# Patient Record
Sex: Female | Born: 1960
Health system: Southern US, Community
[De-identification: ages and names within clinical notes are randomized; demographics above are authoritative.]

## PROBLEM LIST (undated history)

## (undated) DIAGNOSIS — F329 Major depressive disorder, single episode, unspecified: Secondary | ICD-10-CM

## (undated) DIAGNOSIS — F32A Depression, unspecified: Secondary | ICD-10-CM

## (undated) DIAGNOSIS — E119 Type 2 diabetes mellitus without complications: Secondary | ICD-10-CM

## (undated) DIAGNOSIS — I10 Essential (primary) hypertension: Secondary | ICD-10-CM

## (undated) HISTORY — PX: CHOLECYSTECTOMY: SHX55

---

## 1998-10-20 ENCOUNTER — Inpatient Hospital Stay (HOSPITAL_COMMUNITY): Admission: RE | Admit: 1998-10-20 | Discharge: 1998-10-20 | Payer: Self-pay | Admitting: Obstetrics & Gynecology

## 1998-10-21 ENCOUNTER — Encounter: Admission: RE | Admit: 1998-10-21 | Discharge: 1998-10-21 | Payer: Self-pay | Admitting: Obstetrics & Gynecology

## 1998-10-29 ENCOUNTER — Encounter: Admission: RE | Admit: 1998-10-29 | Discharge: 1998-10-29 | Payer: Self-pay | Admitting: Obstetrics

## 1998-10-31 ENCOUNTER — Encounter: Payer: Self-pay | Admitting: Emergency Medicine

## 1998-10-31 ENCOUNTER — Emergency Department (HOSPITAL_COMMUNITY): Admission: EM | Admit: 1998-10-31 | Discharge: 1998-10-31 | Payer: Self-pay | Admitting: Emergency Medicine

## 1998-11-04 ENCOUNTER — Encounter: Admission: RE | Admit: 1998-11-04 | Discharge: 1998-11-04 | Payer: Self-pay | Admitting: Obstetrics & Gynecology

## 1998-11-11 ENCOUNTER — Encounter: Admission: RE | Admit: 1998-11-11 | Discharge: 1998-11-11 | Payer: Self-pay | Admitting: Obstetrics & Gynecology

## 1998-11-25 ENCOUNTER — Encounter: Admission: RE | Admit: 1998-11-25 | Discharge: 1998-11-25 | Payer: Self-pay | Admitting: Obstetrics & Gynecology

## 1998-11-30 ENCOUNTER — Ambulatory Visit (HOSPITAL_COMMUNITY): Admission: RE | Admit: 1998-11-30 | Discharge: 1998-11-30 | Payer: Self-pay | Admitting: Obstetrics

## 1998-12-02 ENCOUNTER — Encounter: Admission: RE | Admit: 1998-12-02 | Discharge: 1998-12-02 | Payer: Self-pay | Admitting: Obstetrics & Gynecology

## 1998-12-09 ENCOUNTER — Encounter: Admission: RE | Admit: 1998-12-09 | Discharge: 1998-12-09 | Payer: Self-pay | Admitting: Obstetrics & Gynecology

## 1998-12-16 ENCOUNTER — Encounter: Admission: RE | Admit: 1998-12-16 | Discharge: 1998-12-16 | Payer: Self-pay | Admitting: Obstetrics & Gynecology

## 1998-12-23 ENCOUNTER — Encounter: Admission: RE | Admit: 1998-12-23 | Discharge: 1998-12-23 | Payer: Self-pay | Admitting: Obstetrics & Gynecology

## 1998-12-23 ENCOUNTER — Encounter (HOSPITAL_COMMUNITY): Admission: RE | Admit: 1998-12-23 | Discharge: 1999-01-25 | Payer: Self-pay | Admitting: Obstetrics & Gynecology

## 1998-12-23 ENCOUNTER — Encounter: Payer: Self-pay | Admitting: Obstetrics

## 1998-12-30 ENCOUNTER — Encounter: Admission: RE | Admit: 1998-12-30 | Discharge: 1998-12-30 | Payer: Self-pay | Admitting: Obstetrics & Gynecology

## 1999-01-06 ENCOUNTER — Encounter: Admission: RE | Admit: 1999-01-06 | Discharge: 1999-01-06 | Payer: Self-pay | Admitting: Obstetrics & Gynecology

## 1999-01-13 ENCOUNTER — Encounter: Admission: RE | Admit: 1999-01-13 | Discharge: 1999-01-13 | Payer: Self-pay | Admitting: Obstetrics & Gynecology

## 1999-01-13 ENCOUNTER — Observation Stay (HOSPITAL_COMMUNITY): Admission: AD | Admit: 1999-01-13 | Discharge: 1999-01-14 | Payer: Self-pay | Admitting: *Deleted

## 1999-01-20 ENCOUNTER — Encounter: Admission: RE | Admit: 1999-01-20 | Discharge: 1999-01-20 | Payer: Self-pay | Admitting: Obstetrics & Gynecology

## 1999-01-21 ENCOUNTER — Inpatient Hospital Stay (HOSPITAL_COMMUNITY): Admission: AD | Admit: 1999-01-21 | Discharge: 1999-01-25 | Payer: Self-pay | Admitting: *Deleted

## 1999-01-21 ENCOUNTER — Encounter (INDEPENDENT_AMBULATORY_CARE_PROVIDER_SITE_OTHER): Payer: Self-pay | Admitting: Specialist

## 1999-01-29 ENCOUNTER — Inpatient Hospital Stay (HOSPITAL_COMMUNITY): Admission: AD | Admit: 1999-01-29 | Discharge: 1999-01-29 | Payer: Self-pay | Admitting: *Deleted

## 1999-01-30 ENCOUNTER — Inpatient Hospital Stay (HOSPITAL_COMMUNITY): Admission: AD | Admit: 1999-01-30 | Discharge: 1999-01-30 | Payer: Self-pay | Admitting: *Deleted

## 1999-02-01 ENCOUNTER — Inpatient Hospital Stay (HOSPITAL_COMMUNITY): Admission: AD | Admit: 1999-02-01 | Discharge: 1999-02-01 | Payer: Self-pay | Admitting: Obstetrics

## 1999-02-23 ENCOUNTER — Emergency Department (HOSPITAL_COMMUNITY): Admission: EM | Admit: 1999-02-23 | Discharge: 1999-02-23 | Payer: Self-pay | Admitting: Emergency Medicine

## 1999-11-30 ENCOUNTER — Emergency Department (HOSPITAL_COMMUNITY): Admission: EM | Admit: 1999-11-30 | Discharge: 1999-11-30 | Payer: Self-pay | Admitting: Emergency Medicine

## 2002-04-30 ENCOUNTER — Encounter: Admission: RE | Admit: 2002-04-30 | Discharge: 2002-04-30 | Payer: Self-pay | Admitting: *Deleted

## 2003-01-13 ENCOUNTER — Emergency Department (HOSPITAL_COMMUNITY): Admission: EM | Admit: 2003-01-13 | Discharge: 2003-01-13 | Payer: Self-pay | Admitting: Emergency Medicine

## 2004-12-12 ENCOUNTER — Emergency Department (HOSPITAL_COMMUNITY): Admission: EM | Admit: 2004-12-12 | Discharge: 2004-12-12 | Payer: Self-pay | Admitting: Emergency Medicine

## 2005-04-13 ENCOUNTER — Emergency Department (HOSPITAL_COMMUNITY): Admission: EM | Admit: 2005-04-13 | Discharge: 2005-04-13 | Payer: Self-pay | Admitting: Emergency Medicine

## 2005-09-19 ENCOUNTER — Emergency Department (HOSPITAL_COMMUNITY): Admission: EM | Admit: 2005-09-19 | Discharge: 2005-09-19 | Payer: Self-pay | Admitting: Emergency Medicine

## 2007-07-23 ENCOUNTER — Encounter (INDEPENDENT_AMBULATORY_CARE_PROVIDER_SITE_OTHER): Payer: Self-pay | Admitting: *Deleted

## 2007-07-23 ENCOUNTER — Observation Stay (HOSPITAL_COMMUNITY): Admission: EM | Admit: 2007-07-23 | Discharge: 2007-07-24 | Payer: Self-pay | Admitting: Emergency Medicine

## 2008-06-01 ENCOUNTER — Emergency Department (HOSPITAL_COMMUNITY): Admission: EM | Admit: 2008-06-01 | Discharge: 2008-06-01 | Payer: Self-pay | Admitting: Emergency Medicine

## 2009-07-15 ENCOUNTER — Emergency Department (HOSPITAL_COMMUNITY): Admission: EM | Admit: 2009-07-15 | Discharge: 2009-07-16 | Payer: Self-pay | Admitting: Emergency Medicine

## 2009-07-16 ENCOUNTER — Ambulatory Visit: Payer: Self-pay | Admitting: Psychiatry

## 2009-07-16 ENCOUNTER — Inpatient Hospital Stay (HOSPITAL_COMMUNITY): Admission: AD | Admit: 2009-07-16 | Discharge: 2009-07-20 | Payer: Self-pay | Admitting: Psychiatry

## 2009-08-17 ENCOUNTER — Emergency Department (HOSPITAL_COMMUNITY): Admission: EM | Admit: 2009-08-17 | Discharge: 2009-08-17 | Payer: Self-pay | Admitting: Emergency Medicine

## 2009-12-14 ENCOUNTER — Emergency Department (HOSPITAL_COMMUNITY): Admission: EM | Admit: 2009-12-14 | Discharge: 2009-12-14 | Payer: Self-pay | Admitting: Emergency Medicine

## 2010-01-04 ENCOUNTER — Emergency Department (HOSPITAL_COMMUNITY): Admission: EM | Admit: 2010-01-04 | Discharge: 2010-01-04 | Payer: Self-pay | Admitting: Emergency Medicine

## 2010-01-04 ENCOUNTER — Ambulatory Visit: Payer: Self-pay | Admitting: Psychiatry

## 2010-01-04 ENCOUNTER — Inpatient Hospital Stay (HOSPITAL_COMMUNITY): Admission: AD | Admit: 2010-01-04 | Discharge: 2010-01-11 | Payer: Self-pay | Admitting: Psychiatry

## 2010-01-15 ENCOUNTER — Inpatient Hospital Stay (HOSPITAL_COMMUNITY): Admission: EM | Admit: 2010-01-15 | Discharge: 2010-01-18 | Payer: Self-pay | Admitting: Emergency Medicine

## 2010-01-18 ENCOUNTER — Inpatient Hospital Stay (HOSPITAL_COMMUNITY): Admission: AD | Admit: 2010-01-18 | Discharge: 2010-01-22 | Payer: Self-pay | Admitting: Psychiatry

## 2010-01-18 ENCOUNTER — Ambulatory Visit: Payer: Self-pay | Admitting: Psychiatry

## 2010-04-05 ENCOUNTER — Ambulatory Visit: Payer: Self-pay | Admitting: Psychiatry

## 2010-08-04 ENCOUNTER — Emergency Department (HOSPITAL_COMMUNITY)
Admission: EM | Admit: 2010-08-04 | Discharge: 2010-08-04 | Disposition: A | Discharge status: Home / Self Care | Payer: Self-pay | Attending: Emergency Medicine | Admitting: Emergency Medicine

## 2010-08-04 DIAGNOSIS — K089 Disorder of teeth and supporting structures, unspecified: Secondary | ICD-10-CM | POA: Insufficient documentation

## 2010-08-04 DIAGNOSIS — F17200 Nicotine dependence, unspecified, uncomplicated: Secondary | ICD-10-CM | POA: Insufficient documentation

## 2010-08-04 DIAGNOSIS — K029 Dental caries, unspecified: Secondary | ICD-10-CM | POA: Insufficient documentation

## 2010-08-20 LAB — HEPATIC FUNCTION PANEL
ALT: 16 U/L (ref 0–35)
AST: 13 U/L (ref 0–37)
Albumin: 3.3 g/dL — ABNORMAL LOW (ref 3.5–5.2)
Alkaline Phosphatase: 60 U/L (ref 39–117)
Bilirubin, Direct: 0.1 mg/dL (ref 0.0–0.3)
Indirect Bilirubin: 0.1 mg/dL — ABNORMAL LOW (ref 0.3–0.9)
Total Bilirubin: 0.2 mg/dL — ABNORMAL LOW (ref 0.3–1.2)
Total Protein: 6.4 g/dL (ref 6.0–8.3)

## 2010-08-20 LAB — CBC
HCT: 37.2 % (ref 36.0–46.0)
HCT: 40.7 % (ref 36.0–46.0)
HCT: 41.2 % (ref 36.0–46.0)
Hemoglobin: 12.7 g/dL (ref 12.0–15.0)
Hemoglobin: 13.9 g/dL (ref 12.0–15.0)
Hemoglobin: 14.1 g/dL (ref 12.0–15.0)
MCH: 32.8 pg (ref 26.0–34.0)
MCH: 32.9 pg (ref 26.0–34.0)
MCH: 33 pg (ref 26.0–34.0)
MCHC: 33.8 g/dL (ref 30.0–36.0)
MCHC: 34.2 g/dL (ref 30.0–36.0)
MCHC: 34.6 g/dL (ref 30.0–36.0)
MCV: 95.4 fL (ref 78.0–100.0)
MCV: 96 fL (ref 78.0–100.0)
MCV: 97.3 fL (ref 78.0–100.0)
Platelets: 300 10*3/uL (ref 150–400)
Platelets: 304 10*3/uL (ref 150–400)
Platelets: 352 10*3/uL (ref 150–400)
RBC: 3.88 MIL/uL (ref 3.87–5.11)
RBC: 4.23 MIL/uL (ref 3.87–5.11)
RBC: 4.27 MIL/uL (ref 3.87–5.11)
RDW: 13.2 % (ref 11.5–15.5)
RDW: 13.4 % (ref 11.5–15.5)
RDW: 13.4 % (ref 11.5–15.5)
WBC: 6.2 10*3/uL (ref 4.0–10.5)
WBC: 6.4 10*3/uL (ref 4.0–10.5)
WBC: 8.1 10*3/uL (ref 4.0–10.5)

## 2010-08-20 LAB — POCT I-STAT, CHEM 8
BUN: 7 mg/dL (ref 6–23)
Calcium, Ion: 1.13 mmol/L (ref 1.12–1.32)
Chloride: 108 mEq/L (ref 96–112)
Creatinine, Ser: 0.8 mg/dL (ref 0.4–1.2)
Glucose, Bld: 162 mg/dL — ABNORMAL HIGH (ref 70–99)
HCT: 45 % (ref 36.0–46.0)
Hemoglobin: 15.3 g/dL — ABNORMAL HIGH (ref 12.0–15.0)
Potassium: 3.3 mEq/L — ABNORMAL LOW (ref 3.5–5.1)
Sodium: 140 mEq/L (ref 135–145)
TCO2: 22 mmol/L (ref 0–100)

## 2010-08-20 LAB — SALICYLATE LEVEL: Salicylate Lvl: <4 mg/dL (ref 2.8–20.0)

## 2010-08-20 LAB — ETHANOL
Alcohol, Ethyl (B): 200 mg/dL — ABNORMAL HIGH (ref 0–10)
Alcohol, Ethyl (B): 210 mg/dL — ABNORMAL HIGH (ref 0–10)
Alcohol, Ethyl (B): 301 mg/dL — ABNORMAL HIGH (ref 0–10)

## 2010-08-20 LAB — POCT CARDIAC MARKERS
CKMB, poc: <1 ng/mL — ABNORMAL LOW (ref 1.0–8.0)
CKMB, poc: <1 ng/mL — ABNORMAL LOW (ref 1.0–8.0)
Myoglobin, poc: 15.8 ng/mL (ref 12–200)
Myoglobin, poc: 38.8 ng/mL (ref 12–200)
Troponin i, poc: <0.05 ng/mL (ref 0.00–0.09)
Troponin i, poc: <0.05 ng/mL (ref 0.00–0.09)

## 2010-08-20 LAB — RAPID URINE DRUG SCREEN, HOSP PERFORMED
Amphetamines: NOT DETECTED
Amphetamines: NOT DETECTED
Barbiturates: NOT DETECTED
Barbiturates: NOT DETECTED
Benzodiazepines: NOT DETECTED
Benzodiazepines: POSITIVE — AB
Cocaine: POSITIVE — AB
Cocaine: POSITIVE — AB
Opiates: NOT DETECTED
Opiates: NOT DETECTED
Tetrahydrocannabinol: NOT DETECTED
Tetrahydrocannabinol: NOT DETECTED

## 2010-08-20 LAB — COMPREHENSIVE METABOLIC PANEL
ALT: 29 U/L (ref 0–35)
ALT: 36 U/L — ABNORMAL HIGH (ref 0–35)
AST: 20 U/L (ref 0–37)
AST: 24 U/L (ref 0–37)
Albumin: 3.6 g/dL (ref 3.5–5.2)
Albumin: 4.2 g/dL (ref 3.5–5.2)
Alkaline Phosphatase: 67 U/L (ref 39–117)
Alkaline Phosphatase: 76 U/L (ref 39–117)
BUN: 11 mg/dL (ref 6–23)
BUN: 9 mg/dL (ref 6–23)
CO2: 22 mEq/L (ref 19–32)
CO2: 24 mEq/L (ref 19–32)
Calcium: 7.9 mg/dL — ABNORMAL LOW (ref 8.4–10.5)
Calcium: 8.9 mg/dL (ref 8.4–10.5)
Chloride: 109 mEq/L (ref 96–112)
Chloride: 113 mEq/L — ABNORMAL HIGH (ref 96–112)
Creatinine, Ser: 0.6 mg/dL (ref 0.4–1.2)
Creatinine, Ser: 0.67 mg/dL (ref 0.4–1.2)
GFR calc Af Amer: >60 mL/min (ref >60)
GFR calc Af Amer: >60 mL/min (ref >60)
GFR calc non Af Amer: >60 mL/min (ref >60)
GFR calc non Af Amer: >60 mL/min (ref >60)
Glucose, Bld: 119 mg/dL — ABNORMAL HIGH (ref 70–99)
Glucose, Bld: 93 mg/dL (ref 70–99)
Potassium: 3.3 mEq/L — ABNORMAL LOW (ref 3.5–5.1)
Potassium: 3.6 mEq/L (ref 3.5–5.1)
Sodium: 143 mEq/L (ref 135–145)
Sodium: 145 mEq/L (ref 135–145)
Total Bilirubin: 0.3 mg/dL (ref 0.3–1.2)
Total Bilirubin: 0.5 mg/dL (ref 0.3–1.2)
Total Protein: 6.7 g/dL (ref 6.0–8.3)
Total Protein: 7.8 g/dL (ref 6.0–8.3)

## 2010-08-20 LAB — URINALYSIS, ROUTINE W REFLEX MICROSCOPIC
Bilirubin Urine: NEGATIVE
Glucose, UA: 100 mg/dL — AB
Hgb urine dipstick: NEGATIVE
Ketones, ur: NEGATIVE mg/dL
Nitrite: NEGATIVE
Protein, ur: NEGATIVE mg/dL
Specific Gravity, Urine: 1.014 (ref 1.005–1.030)
Urobilinogen, UA: 0.2 mg/dL (ref 0.0–1.0)
pH: 6 (ref 5.0–8.0)

## 2010-08-20 LAB — GLUCOSE, RANDOM: Glucose, Bld: 181 mg/dL — ABNORMAL HIGH (ref 70–99)

## 2010-08-20 LAB — DIFFERENTIAL
Basophils Absolute: 0 10*3/uL (ref 0.0–0.1)
Basophils Absolute: 0.1 10*3/uL (ref 0.0–0.1)
Basophils Relative: 1 % (ref 0–1)
Basophils Relative: 1 % (ref 0–1)
Eosinophils Absolute: 0.1 10*3/uL (ref 0.0–0.7)
Eosinophils Absolute: 0.1 10*3/uL (ref 0.0–0.7)
Eosinophils Relative: 1 % (ref 0–5)
Eosinophils Relative: 1 % (ref 0–5)
Lymphocytes Relative: 38 % (ref 12–46)
Lymphocytes Relative: 40 % (ref 12–46)
Lymphs Abs: 2.5 10*3/uL (ref 0.7–4.0)
Lymphs Abs: 3.1 10*3/uL (ref 0.7–4.0)
Monocytes Absolute: 0.4 10*3/uL (ref 0.1–1.0)
Monocytes Absolute: 0.8 10*3/uL (ref 0.1–1.0)
Monocytes Relative: 10 % (ref 3–12)
Monocytes Relative: 7 % (ref 3–12)
Neutro Abs: 3.1 10*3/uL (ref 1.7–7.7)
Neutro Abs: 4.1 10*3/uL (ref 1.7–7.7)
Neutrophils Relative %: 50 % (ref 43–77)
Neutrophils Relative %: 51 % (ref 43–77)

## 2010-08-20 LAB — CARDIAC PANEL(CRET KIN+CKTOT+MB+TROPI)
CK, MB: 1.1 ng/mL (ref 0.3–4.0)
CK, MB: 1.5 ng/mL (ref 0.3–4.0)
Relative Index: INVALID (ref 0.0–2.5)
Relative Index: INVALID (ref 0.0–2.5)
Total CK: 73 U/L (ref 7–177)
Total CK: 77 U/L (ref 7–177)
Troponin I: 0.01 ng/mL (ref 0.00–0.06)
Troponin I: 0.01 ng/mL (ref 0.00–0.06)

## 2010-08-20 LAB — CK TOTAL AND CKMB (NOT AT ARMC)
CK, MB: 1.3 ng/mL (ref 0.3–4.0)
CK, MB: 1.8 ng/mL (ref 0.3–4.0)
Relative Index: INVALID (ref 0.0–2.5)
Relative Index: INVALID (ref 0.0–2.5)
Total CK: 53 U/L (ref 7–177)
Total CK: 81 U/L (ref 7–177)

## 2010-08-20 LAB — MRSA PCR SCREENING: MRSA by PCR: NEGATIVE

## 2010-08-20 LAB — POCT PREGNANCY, URINE: Preg Test, Ur: NEGATIVE

## 2010-08-20 LAB — GLUCOSE, CAPILLARY
Glucose-Capillary: 201 mg/dL — ABNORMAL HIGH (ref 70–99)
Glucose-Capillary: 222 mg/dL — ABNORMAL HIGH (ref 70–99)
Glucose-Capillary: 63 mg/dL — ABNORMAL LOW (ref 70–99)

## 2010-08-20 LAB — HIV ANTIBODY (ROUTINE TESTING W REFLEX): HIV: NONREACTIVE

## 2010-08-20 LAB — PREGNANCY, URINE: Preg Test, Ur: NEGATIVE

## 2010-08-20 LAB — TROPONIN I
Troponin I: 0.01 ng/mL (ref 0.00–0.06)
Troponin I: <0.01 ng/mL (ref 0.00–0.06)

## 2010-08-20 LAB — HEMOGLOBIN A1C
Hgb A1c MFr Bld: 6.2 % — ABNORMAL HIGH (ref <5.7)
Mean Plasma Glucose: 131 mg/dL — ABNORMAL HIGH (ref <117)

## 2010-08-20 LAB — ACETAMINOPHEN LEVEL: Acetaminophen (Tylenol), Serum: <10 ug/mL — ABNORMAL LOW (ref 10–30)

## 2010-08-20 LAB — TSH: TSH: 1.017 u[IU]/mL (ref 0.350–4.500)

## 2010-08-22 LAB — CBC
HCT: 38.8 % (ref 36.0–46.0)
Hemoglobin: 13.6 g/dL (ref 12.0–15.0)
MCH: 33.7 pg (ref 26.0–34.0)
MCHC: 35 g/dL (ref 30.0–36.0)
MCV: 96.3 fL (ref 78.0–100.0)
Platelets: 328 10*3/uL (ref 150–400)
RBC: 4.03 MIL/uL (ref 3.87–5.11)
RDW: 13.2 % (ref 11.5–15.5)
WBC: 8.5 10*3/uL (ref 4.0–10.5)

## 2010-08-22 LAB — DIFFERENTIAL
Basophils Absolute: 0 10*3/uL (ref 0.0–0.1)
Basophils Relative: 0 % (ref 0–1)
Eosinophils Absolute: 0.1 10*3/uL (ref 0.0–0.7)
Eosinophils Relative: 1 % (ref 0–5)
Lymphocytes Relative: 32 % (ref 12–46)
Lymphs Abs: 2.7 10*3/uL (ref 0.7–4.0)
Monocytes Absolute: 0.7 10*3/uL (ref 0.1–1.0)
Monocytes Relative: 8 % (ref 3–12)
Neutro Abs: 5 10*3/uL (ref 1.7–7.7)
Neutrophils Relative %: 59 % (ref 43–77)

## 2010-08-22 LAB — BASIC METABOLIC PANEL
BUN: 11 mg/dL (ref 6–23)
CO2: 28 mEq/L (ref 19–32)
Calcium: 9.2 mg/dL (ref 8.4–10.5)
Chloride: 106 mEq/L (ref 96–112)
Creatinine, Ser: 0.57 mg/dL (ref 0.4–1.2)
GFR calc Af Amer: >60 mL/min (ref >60)
GFR calc non Af Amer: >60 mL/min (ref >60)
Glucose, Bld: 161 mg/dL — ABNORMAL HIGH (ref 70–99)
Potassium: 3.3 mEq/L — ABNORMAL LOW (ref 3.5–5.1)
Sodium: 141 mEq/L (ref 135–145)

## 2010-08-22 LAB — RAPID URINE DRUG SCREEN, HOSP PERFORMED
Amphetamines: NOT DETECTED
Barbiturates: NOT DETECTED
Benzodiazepines: NOT DETECTED
Cocaine: POSITIVE — AB
Opiates: NOT DETECTED
Tetrahydrocannabinol: POSITIVE — AB

## 2010-08-22 LAB — ETHANOL: Alcohol, Ethyl (B): <5 mg/dL (ref 0–10)

## 2010-08-22 LAB — TRICYCLICS SCREEN, URINE: TCA Scrn: NOT DETECTED

## 2010-08-26 LAB — DIFFERENTIAL
Basophils Absolute: 0 10*3/uL (ref 0.0–0.1)
Basophils Relative: 1 % (ref 0–1)
Eosinophils Absolute: 0.2 10*3/uL (ref 0.0–0.7)
Eosinophils Relative: 3 % (ref 0–5)
Lymphocytes Relative: 25 % (ref 12–46)
Lymphs Abs: 1.9 10*3/uL (ref 0.7–4.0)
Monocytes Absolute: 0.7 10*3/uL (ref 0.1–1.0)
Monocytes Relative: 9 % (ref 3–12)
Neutro Abs: 4.6 10*3/uL (ref 1.7–7.7)
Neutrophils Relative %: 62 % (ref 43–77)

## 2010-08-26 LAB — COMPREHENSIVE METABOLIC PANEL
ALT: 12 U/L (ref 0–35)
AST: 15 U/L (ref 0–37)
Albumin: 4.2 g/dL (ref 3.5–5.2)
Alkaline Phosphatase: 74 U/L (ref 39–117)
BUN: 14 mg/dL (ref 6–23)
CO2: 29 mEq/L (ref 19–32)
Calcium: 9.2 mg/dL (ref 8.4–10.5)
Chloride: 103 mEq/L (ref 96–112)
Creatinine, Ser: 0.58 mg/dL (ref 0.4–1.2)
GFR calc Af Amer: >60 mL/min (ref >60)
GFR calc non Af Amer: >60 mL/min (ref >60)
Glucose, Bld: 99 mg/dL (ref 70–99)
Potassium: 3.4 mEq/L — ABNORMAL LOW (ref 3.5–5.1)
Sodium: 138 mEq/L (ref 135–145)
Total Bilirubin: 0.3 mg/dL (ref 0.3–1.2)
Total Protein: 7.8 g/dL (ref 6.0–8.3)

## 2010-08-26 LAB — URINALYSIS, ROUTINE W REFLEX MICROSCOPIC
Bilirubin Urine: NEGATIVE
Glucose, UA: NEGATIVE mg/dL
Hgb urine dipstick: NEGATIVE
Ketones, ur: NEGATIVE mg/dL
Nitrite: NEGATIVE
Protein, ur: NEGATIVE mg/dL
Specific Gravity, Urine: 1.029 (ref 1.005–1.030)
Urobilinogen, UA: 1 mg/dL (ref 0.0–1.0)
pH: 6.5 (ref 5.0–8.0)

## 2010-08-26 LAB — POCT PREGNANCY, URINE: Preg Test, Ur: NEGATIVE

## 2010-08-26 LAB — CBC
HCT: 42.2 % (ref 36.0–46.0)
Hemoglobin: 14.4 g/dL (ref 12.0–15.0)
MCHC: 34 g/dL (ref 30.0–36.0)
MCV: 97.5 fL (ref 78.0–100.0)
Platelets: 351 10*3/uL (ref 150–400)
RBC: 4.33 MIL/uL (ref 3.87–5.11)
RDW: 14.4 % (ref 11.5–15.5)
WBC: 7.4 10*3/uL (ref 4.0–10.5)

## 2010-08-26 LAB — RAPID URINE DRUG SCREEN, HOSP PERFORMED
Amphetamines: NOT DETECTED
Barbiturates: NOT DETECTED
Benzodiazepines: POSITIVE — AB
Cocaine: POSITIVE — AB
Opiates: NOT DETECTED
Tetrahydrocannabinol: POSITIVE — AB

## 2010-08-26 LAB — ETHANOL: Alcohol, Ethyl (B): <5 mg/dL (ref 0–10)

## 2010-10-19 NOTE — Discharge Summary (Signed)
Robin Montgomery, Robin Montgomery              ACCOUNT NO.:  [id number]   MEDICAL RECORD NO.:  [id number]          PATIENT TYPE:  INP   LOCATION:  2909                         FACILITY:  [facility]   PHYSICIAN:  Beckey Rutter, MD  DATE OF BIRTH:  1961-06-04   DATE OF ADMISSION:  07/22/2007  DATE OF DISCHARGE:  07/24/2007                               DISCHARGE SUMMARY   PRIMARY CARE PHYSICIAN:  Unassigned.   CHIEF COMPLAINT AND HISTORY OF PRESENT ILLNESS:  Please refer to the H&P  dictated on the day of admission.  Briefly, a 50 year old who presented  with chest tightness and pain after cocaine abuse.   HOSPITAL COURSE:  Problem 1.  chest pressure: During hospitalization the patient was ruled  out for acute coronary syndromes by serial cardiac enzymes and EKG.  The  patient's pain resolved and the telemetry monitoring was uneventful.  The patient is stable for discharge today.  She is encouraged to follow  up with her primary physician within a week, and she understands and  agreed to this discharge plan.   Problem 2.  COCAINE ABUSE:  Patient counseled.   Problem 3.  DEPRESSION:  The patient is tearful on examination and she  stated that she has been down for some time now and that is why she was  abusing cocaine.  The patient was started on Prozac for these depression  symptoms.  The patient was encouraged to follow up with a physician for  further adjustment and management of her depression symptoms.   DISCHARGE DIAGNOSES:  1. Chest pain after cocaine abuse.  2. Depression.  3. Substance abuse.   DISCHARGE MEDICATIONS:  Prozac 20 mg p.o. daily.   DISCHARGE PLAN:  patient 2D echo was reviewed and the need for f/u was  discussed. The patient is stable for discharge today to follow up with  her primary physician as discussed with her.      Beckey Rutter, MD  Electronically Signed     EME/MEDQ  D:  07/24/2007  T:  07/25/2007  Job:  [id number]

## 2010-10-19 NOTE — H&P (Signed)
NAME:  Robin Montgomery, Robin Montgomery              ACCOUNT NO.:  [id number]   MEDICAL RECORD NO.:  [id number]          PATIENT TYPE:  EMS   LOCATION:  MAJO                         FACILITY:  [facility]   PHYSICIAN:  Madaline Savage, MD        DATE OF BIRTH:  1961/03/25   DATE OF ADMISSION:  07/23/2007  DATE OF DISCHARGE:                              HISTORY & PHYSICAL   PRIMARY CARE PHYSICIAN:  None; this patient is unassigned to [place].   CHIEF COMPLAINT:  Chest pain.   HISTORY OF PRESENT ILLNESS:  Ms. Clendenen is a 50 year old African  American lady with a history of smoking and cocaine abuse, who comes in  with chest pain.  She states she last used cocaine last evening and her  chest pain started a few hours after she used cocaine.  The chest pain  was central; she describes it like a knife on her chest and also as  someone sitting on her chest.  She rated it about 7/10 to 8/10 at that  time, but it has eased to 2/10 to 3/10 at this time.  The chest pain was  constant and there was no waxing or waning of the chest pain.  She  states that there was some associated short of breath and the chest pain  radiated to her right shoulder and arm.  She denies any nausea,  diaphoresis or sweating.   PAST MEDICAL HISTORY:  None.   PAST SURGICAL HISTORY:  Cholecystectomy.   ALLERGIES:  NO KNOWN DRUG ALLERGIES.   CURRENT MEDICATIONS:  She takes ibuprofen as needed.   SOCIAL HISTORY:  She smokes approximately 1-1/2 packs of cigarettes a  day and has been smoking since the age of 41.  She takes alcohol 2-3  times a week.  She has a history of cocaine abuse and last used cocaine  yesterday.   FAMILY HISTORY:  She is adopted; she does not know her biological  parents.   REVIEW OF SYSTEMS:  GENERAL:  She denies any recent weight loss or  weight gain.  No fever or chills.  HEENT:  No headaches.  No blurred  vision.  No sore throat.  CARDIOVASCULAR:  She does complain of chest  pain.  No palpitations.  RESPIRATORY:  No  shortness of breath or cough.  GI:  No abdominal pain, nausea, vomiting, diarrhea or constipation.  GENITOURINARY:  No dysuria or polyuria.  MUSCULOSKELETAL:  She denies  any tingling or numbness in the toes or fingers.   PHYSICAL EXAMINATION:  GENERAL:  She is alert and oriented x3.  VITAL SIGNS:  Temperature is 98.1, pulse rate of 97, respiratory rate of  24, blood pressure 102/51.  Oxygen saturation 100%.  HEENT:  Head atraumatic, normocephalic.  Pupils bilaterally equal, round  and reactive to light.  Her mucous membranes are moist.  NECK:  Supple.  No JVD.  No carotid bruit.  CARDIOVASCULAR:  S1 and S2 heard, regular rate and rhythm.  CHEST:  Clear to auscultation.  ABDOMEN:  Soft.  Bowel sounds heard.  EXTREMITIES:  No edema, cyanosis or clubbing.  LABORATORY AND ACCESSORY CLINICAL DATA:  She has 2 sets of troponins  which were less than 0.05 each.  Her CK-MB has been less than 1 and 1.1.  Her white count is 9, hemoglobin 12.8, platelets 394,000.  Sodium 138,  potassium 2.8, chloride 104, bicarb 23, BUN 10, creatinine of 0.6.   X-ray of chest shows no acute process.   EKG shows sinus tachycardia with no ST-T wave changes.   D-dimer is 0.23.  Her alcohol level was 33.  Her urinalysis was  negative.  Her urine pregnancy test was negative.   IMPRESSION:  1. Chest pain likely secondary to cocaine abuse.  2. Hypokalemia.  3. Tobaccoism.   PLAN:  This is a 50 year old lady with her only risk factor for coronary  artery disease being smoking, who comes in with chest pain.  She did use  cocaine a few hours prior to her chest pain, which is most likely the  etiology of her chest pain.  Her first 2 sets of cardiac markers have  been negative.  Her chest pain has eased off; she now rates it 3/10.  We  will keep her as a 23-hour observation and cycle cardiac markers and we  will get an EKG in the morning.  I will replace her potassium at this  time.  She was also counseled on  tobacco cessation.  I will put her on  DVT prophylaxis.      Madaline Savage, MD  Electronically Signed     PKN/MEDQ  D:  07/23/2007  T:  07/23/2007  Job:  [id number]

## 2011-02-17 ENCOUNTER — Other Ambulatory Visit: Payer: Self-pay | Admitting: Family Medicine

## 2011-02-17 ENCOUNTER — Ambulatory Visit
Admission: RE | Admit: 2011-02-17 | Discharge: 2011-02-17 | Disposition: A | Discharge status: Home / Self Care | Payer: Medicaid Other | Source: Ambulatory Visit | Attending: Family Medicine | Admitting: Family Medicine

## 2011-02-17 DIAGNOSIS — Z0000 Encounter for general adult medical examination without abnormal findings: Secondary | ICD-10-CM

## 2011-02-17 DIAGNOSIS — Z1231 Encounter for screening mammogram for malignant neoplasm of breast: Secondary | ICD-10-CM

## 2011-02-21 ENCOUNTER — Ambulatory Visit: Payer: Medicaid Other

## 2011-02-25 LAB — DIFFERENTIAL
Basophils Absolute: 0.1
Basophils Relative: 1
Eosinophils Absolute: 0.4
Eosinophils Relative: 4
Lymphocytes Relative: 33
Lymphs Abs: 3
Monocytes Absolute: 1
Monocytes Relative: 11
Neutro Abs: 4.6
Neutrophils Relative %: 51

## 2011-02-25 LAB — CBC
HCT: 32.7 — ABNORMAL LOW
HCT: 37.8
Hemoglobin: 11.1 — ABNORMAL LOW
Hemoglobin: 12.8
MCHC: 33.9
MCHC: 33.9
MCV: 93.5
MCV: 94.7
Platelets: 312
Platelets: 394
RBC: 3.46 — ABNORMAL LOW
RBC: 4.05
RDW: 13.7
RDW: 13.9
WBC: 6.6
WBC: 9

## 2011-02-25 LAB — BASIC METABOLIC PANEL
BUN: 6
CO2: 25
Calcium: 8.5
Chloride: 108
Creatinine, Ser: 0.6
GFR calc Af Amer: >60
GFR calc non Af Amer: >60
Glucose, Bld: 159 — ABNORMAL HIGH
Potassium: 4.1
Sodium: 137

## 2011-02-25 LAB — I-STAT 8, (EC8 V) (CONVERTED LAB)
Acid-base deficit: 2
BUN: 10
Bicarbonate: 21.8
Chloride: 104
Glucose, Bld: 144 — ABNORMAL HIGH
HCT: 42
Hemoglobin: 14.3
Operator id: 265201
Potassium: 2.8 — ABNORMAL LOW
Sodium: 138
TCO2: 23
pCO2, Ven: 33.6 — ABNORMAL LOW
pH, Ven: 7.419 — ABNORMAL HIGH

## 2011-02-25 LAB — LIPID PANEL
Cholesterol: 183
HDL: 50
LDL Cholesterol: 117 — ABNORMAL HIGH
Total CHOL/HDL Ratio: 3.7
Triglycerides: 80
VLDL: 16

## 2011-02-25 LAB — TROPONIN I: Troponin I: <0.01

## 2011-02-25 LAB — URINALYSIS, ROUTINE W REFLEX MICROSCOPIC
Glucose, UA: NEGATIVE
Hgb urine dipstick: NEGATIVE
Ketones, ur: 15 — AB
Leukocytes, UA: NEGATIVE
Nitrite: NEGATIVE
Protein, ur: 30 — AB
Specific Gravity, Urine: 1.035 — ABNORMAL HIGH
Urobilinogen, UA: 1
pH: 6

## 2011-02-25 LAB — D-DIMER, QUANTITATIVE: D-Dimer, Quant: 0.23

## 2011-02-25 LAB — ETHANOL: Alcohol, Ethyl (B): 33 — ABNORMAL HIGH

## 2011-02-25 LAB — POCT CARDIAC MARKERS
CKMB, poc: 1.1
CKMB, poc: <1 — ABNORMAL LOW
Myoglobin, poc: 18.7
Myoglobin, poc: 22.3
Operator id: 265201
Operator id: 282201
Troponin i, poc: <0.05
Troponin i, poc: <0.05

## 2011-02-25 LAB — POCT I-STAT CREATININE
Creatinine, Ser: 0.6
Operator id: 265201

## 2011-02-25 LAB — APTT: aPTT: 27

## 2011-02-25 LAB — URINE MICROSCOPIC-ADD ON

## 2011-02-25 LAB — CARDIAC PANEL(CRET KIN+CKTOT+MB+TROPI)
CK, MB: 0.9
CK, MB: 1
Relative Index: INVALID
Relative Index: INVALID
Total CK: 75
Total CK: 82
Troponin I: 0.01
Troponin I: <0.01

## 2011-02-25 LAB — PROTIME-INR
INR: 1
Prothrombin Time: 13.3

## 2011-02-25 LAB — POCT PREGNANCY, URINE
Operator id: 282201
Preg Test, Ur: NEGATIVE

## 2011-02-25 LAB — CK TOTAL AND CKMB (NOT AT ARMC)
CK, MB: 1.2
Relative Index: INVALID
Total CK: 95

## 2011-02-25 LAB — B-NATRIURETIC PEPTIDE (CONVERTED LAB): Pro B Natriuretic peptide (BNP): <30

## 2011-02-25 LAB — TSH: TSH: 0.645

## 2011-03-28 ENCOUNTER — Emergency Department (HOSPITAL_COMMUNITY)
Admission: EM | Admit: 2011-03-28 | Discharge: 2011-03-28 | Disposition: A | Discharge status: Home / Self Care | Payer: Medicaid Other | Attending: Emergency Medicine | Admitting: Emergency Medicine

## 2011-03-28 DIAGNOSIS — M79609 Pain in unspecified limb: Secondary | ICD-10-CM | POA: Insufficient documentation

## 2011-03-28 DIAGNOSIS — M722 Plantar fascial fibromatosis: Secondary | ICD-10-CM | POA: Insufficient documentation

## 2013-01-02 ENCOUNTER — Other Ambulatory Visit: Payer: Self-pay | Admitting: Internal Medicine

## 2013-01-02 DIAGNOSIS — N644 Mastodynia: Secondary | ICD-10-CM

## 2013-01-17 ENCOUNTER — Ambulatory Visit
Admission: RE | Admit: 2013-01-17 | Discharge: 2013-01-17 | Disposition: A | Discharge status: Home / Self Care | Payer: Medicaid Other | Source: Ambulatory Visit | Attending: Internal Medicine | Admitting: Internal Medicine

## 2013-01-17 ENCOUNTER — Other Ambulatory Visit: Payer: Self-pay | Admitting: Internal Medicine

## 2013-01-17 DIAGNOSIS — N644 Mastodynia: Secondary | ICD-10-CM

## 2013-11-27 ENCOUNTER — Other Ambulatory Visit: Payer: Self-pay | Admitting: Internal Medicine

## 2013-11-27 DIAGNOSIS — N6320 Unspecified lump in the left breast, unspecified quadrant: Secondary | ICD-10-CM

## 2014-01-20 ENCOUNTER — Other Ambulatory Visit: Payer: Medicaid Other

## 2014-03-05 ENCOUNTER — Other Ambulatory Visit: Payer: Medicaid Other

## 2014-03-19 ENCOUNTER — Emergency Department (HOSPITAL_COMMUNITY)
Admission: EM | Admit: 2014-03-19 | Discharge: 2014-03-19 | Disposition: A | Discharge status: Home / Self Care | Payer: Medicaid Other | Attending: Emergency Medicine | Admitting: Emergency Medicine

## 2014-03-19 ENCOUNTER — Encounter (HOSPITAL_COMMUNITY): Payer: Self-pay | Admitting: General Practice

## 2014-03-19 ENCOUNTER — Inpatient Hospital Stay (HOSPITAL_COMMUNITY)
Admission: AD | Admit: 2014-03-19 | Discharge: 2014-03-27 | DRG: 897 | Disposition: A | Discharge status: Home / Self Care | Payer: Medicaid Other | Source: Intra-hospital | Attending: Psychiatry | Admitting: Psychiatry

## 2014-03-19 ENCOUNTER — Encounter (HOSPITAL_COMMUNITY): Payer: Self-pay | Admitting: Emergency Medicine

## 2014-03-19 DIAGNOSIS — F1420 Cocaine dependence, uncomplicated: Secondary | ICD-10-CM

## 2014-03-19 DIAGNOSIS — I10 Essential (primary) hypertension: Secondary | ICD-10-CM | POA: Diagnosis present

## 2014-03-19 DIAGNOSIS — Z791 Long term (current) use of non-steroidal anti-inflammatories (NSAID): Secondary | ICD-10-CM | POA: Diagnosis not present

## 2014-03-19 DIAGNOSIS — F419 Anxiety disorder, unspecified: Secondary | ICD-10-CM | POA: Diagnosis present

## 2014-03-19 DIAGNOSIS — Z91410 Personal history of adult physical and sexual abuse: Secondary | ICD-10-CM | POA: Diagnosis not present

## 2014-03-19 DIAGNOSIS — R45851 Suicidal ideations: Secondary | ICD-10-CM | POA: Insufficient documentation

## 2014-03-19 DIAGNOSIS — Z9119 Patient's noncompliance with other medical treatment and regimen: Secondary | ICD-10-CM | POA: Insufficient documentation

## 2014-03-19 DIAGNOSIS — Z720 Tobacco use: Secondary | ICD-10-CM | POA: Diagnosis not present

## 2014-03-19 DIAGNOSIS — F329 Major depressive disorder, single episode, unspecified: Secondary | ICD-10-CM | POA: Diagnosis present

## 2014-03-19 DIAGNOSIS — R4689 Other symptoms and signs involving appearance and behavior: Secondary | ICD-10-CM

## 2014-03-19 DIAGNOSIS — F3289 Other specified depressive episodes: Secondary | ICD-10-CM

## 2014-03-19 DIAGNOSIS — F1994 Other psychoactive substance use, unspecified with psychoactive substance-induced mood disorder: Secondary | ICD-10-CM

## 2014-03-19 DIAGNOSIS — F17210 Nicotine dependence, cigarettes, uncomplicated: Secondary | ICD-10-CM | POA: Diagnosis present

## 2014-03-19 DIAGNOSIS — F10239 Alcohol dependence with withdrawal, unspecified: Secondary | ICD-10-CM | POA: Diagnosis present

## 2014-03-19 DIAGNOSIS — F1410 Cocaine abuse, uncomplicated: Secondary | ICD-10-CM | POA: Diagnosis present

## 2014-03-19 DIAGNOSIS — G8929 Other chronic pain: Secondary | ICD-10-CM | POA: Diagnosis present

## 2014-03-19 DIAGNOSIS — E780 Pure hypercholesterolemia: Secondary | ICD-10-CM | POA: Diagnosis present

## 2014-03-19 DIAGNOSIS — F19951 Other psychoactive substance use, unspecified with psychoactive substance-induced psychotic disorder with hallucinations: Secondary | ICD-10-CM

## 2014-03-19 DIAGNOSIS — F1020 Alcohol dependence, uncomplicated: Secondary | ICD-10-CM

## 2014-03-19 DIAGNOSIS — E1165 Type 2 diabetes mellitus with hyperglycemia: Secondary | ICD-10-CM | POA: Diagnosis not present

## 2014-03-19 DIAGNOSIS — R4589 Other symptoms and signs involving emotional state: Secondary | ICD-10-CM

## 2014-03-19 DIAGNOSIS — E119 Type 2 diabetes mellitus without complications: Secondary | ICD-10-CM | POA: Diagnosis present

## 2014-03-19 DIAGNOSIS — G4700 Insomnia, unspecified: Secondary | ICD-10-CM | POA: Diagnosis present

## 2014-03-19 DIAGNOSIS — E118 Type 2 diabetes mellitus with unspecified complications: Secondary | ICD-10-CM

## 2014-03-19 HISTORY — DX: Major depressive disorder, single episode, unspecified: F32.9

## 2014-03-19 HISTORY — DX: Essential (primary) hypertension: I10

## 2014-03-19 HISTORY — DX: Type 2 diabetes mellitus without complications: E11.9

## 2014-03-19 HISTORY — DX: Depression, unspecified: F32.A

## 2014-03-19 LAB — COMPREHENSIVE METABOLIC PANEL
ALT: 23 U/L (ref 0–35)
AST: 21 U/L (ref 0–37)
Albumin: 4.5 g/dL (ref 3.5–5.2)
Alkaline Phosphatase: 86 U/L (ref 39–117)
Anion gap: 19 — ABNORMAL HIGH (ref 5–15)
BUN: 10 mg/dL (ref 6–23)
CO2: 22 mEq/L (ref 19–32)
Calcium: 9.8 mg/dL (ref 8.4–10.5)
Chloride: 103 mEq/L (ref 96–112)
Creatinine, Ser: 0.6 mg/dL (ref 0.50–1.10)
GFR calc Af Amer: >90 mL/min (ref >90)
GFR calc non Af Amer: >90 mL/min (ref >90)
Glucose, Bld: 280 mg/dL — ABNORMAL HIGH (ref 70–99)
Potassium: 3.8 mEq/L (ref 3.7–5.3)
Sodium: 144 mEq/L (ref 137–147)
Total Bilirubin: <0.2 mg/dL — ABNORMAL LOW (ref 0.3–1.2)
Total Protein: 8.4 g/dL — ABNORMAL HIGH (ref 6.0–8.3)

## 2014-03-19 LAB — RAPID URINE DRUG SCREEN, HOSP PERFORMED
Amphetamines: NOT DETECTED
Barbiturates: NOT DETECTED
Benzodiazepines: NOT DETECTED
Cocaine: POSITIVE — AB
Opiates: NOT DETECTED
Tetrahydrocannabinol: NOT DETECTED

## 2014-03-19 LAB — GLUCOSE, CAPILLARY
Glucose-Capillary: 275 mg/dL — ABNORMAL HIGH (ref 70–99)
Glucose-Capillary: 268 mg/dL — ABNORMAL HIGH (ref 70–99)

## 2014-03-19 LAB — CBC
HCT: 38.6 % (ref 36.0–46.0)
Hemoglobin: 13.1 g/dL (ref 12.0–15.0)
MCH: 31.8 pg (ref 26.0–34.0)
MCHC: 33.9 g/dL (ref 30.0–36.0)
MCV: 93.7 fL (ref 78.0–100.0)
Platelets: 359 10*3/uL (ref 150–400)
RBC: 4.12 MIL/uL (ref 3.87–5.11)
RDW: 14.3 % (ref 11.5–15.5)
WBC: 9.1 10*3/uL (ref 4.0–10.5)

## 2014-03-19 LAB — SALICYLATE LEVEL: Salicylate Lvl: <2 mg/dL — ABNORMAL LOW (ref 2.8–20.0)

## 2014-03-19 LAB — ACETAMINOPHEN LEVEL: Acetaminophen (Tylenol), Serum: <15 ug/mL (ref 10–30)

## 2014-03-19 LAB — ETHANOL: Alcohol, Ethyl (B): 95 mg/dL — ABNORMAL HIGH (ref 0–11)

## 2014-03-19 LAB — CBG MONITORING, ED: Glucose-Capillary: 231 mg/dL — ABNORMAL HIGH (ref 70–99)

## 2014-03-19 MED ORDER — NICOTINE 21 MG/24HR TD PT24
21.0000 mg | MEDICATED_PATCH | Freq: Every day | TRANSDERMAL | Status: DC
  Administered 2014-03-19 – 2014-03-26 (×8): 21 mg via TRANSDERMAL
  Filled 2014-03-19 (×12): qty 1
  Filled 2014-03-19: qty 14
  Filled 2014-03-19: qty 1

## 2014-03-19 MED ORDER — PNEUMOCOCCAL VAC POLYVALENT 25 MCG/0.5ML IJ INJ
0.5000 mL | INJECTION | INTRAMUSCULAR | Status: AC
  Administered 2014-03-20: 0.5 mL via INTRAMUSCULAR

## 2014-03-19 MED ORDER — ONDANSETRON 4 MG PO TBDP
4.0000 mg | ORAL_TABLET | Freq: Four times a day (QID) | ORAL | Status: AC | PRN
Start: 2014-03-19 — End: 2014-03-22

## 2014-03-19 MED ORDER — THIAMINE HCL 100 MG/ML IJ SOLN: 100.0000 mg | Freq: Every day | INTRAMUSCULAR | Status: DC

## 2014-03-19 MED ORDER — ACETAMINOPHEN 325 MG PO TABS: 650.0000 mg | ORAL_TABLET | ORAL | Status: DC | PRN

## 2014-03-19 MED ORDER — VITAMIN B-1 100 MG PO TABS
100.0000 mg | ORAL_TABLET | Freq: Every day | ORAL | Status: DC
  Administered 2014-03-20 – 2014-03-27 (×8): 100 mg via ORAL
  Filled 2014-03-19 (×11): qty 1

## 2014-03-19 MED ORDER — MAGNESIUM HYDROXIDE 400 MG/5ML PO SUSP: 30.0000 mL | Freq: Every day | ORAL | Status: DC | PRN

## 2014-03-19 MED ORDER — LORAZEPAM 2 MG/ML IJ SOLN: 0.0000 mg | Freq: Four times a day (QID) | INTRAMUSCULAR | Status: DC

## 2014-03-19 MED ORDER — METFORMIN HCL 500 MG PO TABS
500.0000 mg | ORAL_TABLET | Freq: Two times a day (BID) | ORAL | Status: DC
  Administered 2014-03-19: 500 mg via ORAL
  Filled 2014-03-19: qty 1

## 2014-03-19 MED ORDER — VITAMIN B-1 100 MG PO TABS
100.0000 mg | ORAL_TABLET | Freq: Every day | ORAL | Status: DC
  Administered 2014-03-19: 100 mg via ORAL
  Filled 2014-03-19: qty 1

## 2014-03-19 MED ORDER — CHLORDIAZEPOXIDE HCL 25 MG PO CAPS
25.0000 mg | ORAL_CAPSULE | Freq: Four times a day (QID) | ORAL | Status: AC
  Administered 2014-03-19 – 2014-03-20 (×6): 25 mg via ORAL
  Filled 2014-03-19 (×6): qty 1

## 2014-03-19 MED ORDER — NICOTINE 21 MG/24HR TD PT24: 21.0000 mg | MEDICATED_PATCH | Freq: Every day | TRANSDERMAL | Status: DC

## 2014-03-19 MED ORDER — ADULT MULTIVITAMIN W/MINERALS CH
1.0000 | ORAL_TABLET | Freq: Every day | ORAL | Status: DC
Start: 2014-03-19 — End: 2014-03-27
  Administered 2014-03-19 – 2014-03-27 (×9): 1 via ORAL
  Filled 2014-03-19 (×14): qty 1

## 2014-03-19 MED ORDER — LORAZEPAM 1 MG PO TABS: 1.0000 mg | ORAL_TABLET | Freq: Three times a day (TID) | ORAL | Status: DC | PRN

## 2014-03-19 MED ORDER — CHLORDIAZEPOXIDE HCL 25 MG PO CAPS
25.0000 mg | ORAL_CAPSULE | Freq: Four times a day (QID) | ORAL | Status: AC | PRN
  Administered 2014-03-20 – 2014-03-21 (×3): 25 mg via ORAL
  Filled 2014-03-19 (×3): qty 1

## 2014-03-19 MED ORDER — INFLUENZA VAC SPLIT QUAD 0.5 ML IM SUSY
0.5000 mL | PREFILLED_SYRINGE | INTRAMUSCULAR | Status: AC
  Administered 2014-03-20: 0.5 mL via INTRAMUSCULAR
  Filled 2014-03-19: qty 0.5

## 2014-03-19 MED ORDER — LORAZEPAM 2 MG/ML IJ SOLN: 0.0000 mg | Freq: Two times a day (BID) | INTRAMUSCULAR | Status: DC

## 2014-03-19 MED ORDER — ONDANSETRON HCL 4 MG PO TABS
4.0000 mg | ORAL_TABLET | Freq: Three times a day (TID) | ORAL | Status: DC | PRN
Start: 2014-03-19 — End: 2014-03-19

## 2014-03-19 MED ORDER — GLIMEPIRIDE 4 MG PO TABS
4.0000 mg | ORAL_TABLET | Freq: Every day | ORAL | Status: DC
  Filled 2014-03-19: qty 1

## 2014-03-19 MED ORDER — THIAMINE HCL 100 MG/ML IJ SOLN
100.0000 mg | Freq: Once | INTRAMUSCULAR | Status: DC
Start: 2014-03-19 — End: 2014-03-27

## 2014-03-19 MED ORDER — CHLORDIAZEPOXIDE HCL 25 MG PO CAPS
25.0000 mg | ORAL_CAPSULE | Freq: Three times a day (TID) | ORAL | Status: AC
  Administered 2014-03-21 (×3): 25 mg via ORAL
  Filled 2014-03-19 (×3): qty 1

## 2014-03-19 MED ORDER — LORAZEPAM 1 MG PO TABS: 0.0000 mg | ORAL_TABLET | Freq: Four times a day (QID) | ORAL | Status: DC

## 2014-03-19 MED ORDER — HYDROXYZINE HCL 25 MG PO TABS
25.0000 mg | ORAL_TABLET | Freq: Four times a day (QID) | ORAL | Status: AC | PRN
  Filled 2014-03-19: qty 1

## 2014-03-19 MED ORDER — ALUM & MAG HYDROXIDE-SIMETH 200-200-20 MG/5ML PO SUSP: 30.0000 mL | ORAL | Status: DC | PRN

## 2014-03-19 MED ORDER — CHLORDIAZEPOXIDE HCL 25 MG PO CAPS
25.0000 mg | ORAL_CAPSULE | Freq: Every day | ORAL | Status: AC
  Administered 2014-03-23: 25 mg via ORAL
  Filled 2014-03-19: qty 1

## 2014-03-19 MED ORDER — ACETAMINOPHEN 325 MG PO TABS
650.0000 mg | ORAL_TABLET | Freq: Four times a day (QID) | ORAL | Status: DC | PRN
  Administered 2014-03-20 – 2014-03-23 (×5): 650 mg via ORAL
  Filled 2014-03-19 (×5): qty 2

## 2014-03-19 MED ORDER — LOPERAMIDE HCL 2 MG PO CAPS: 2.0000 mg | ORAL_CAPSULE | ORAL | Status: AC | PRN

## 2014-03-19 MED ORDER — LISINOPRIL 10 MG PO TABS
10.0000 mg | ORAL_TABLET | Freq: Every day | ORAL | Status: DC
  Administered 2014-03-19: 10 mg via ORAL
  Filled 2014-03-19: qty 1

## 2014-03-19 MED ORDER — CHLORDIAZEPOXIDE HCL 25 MG PO CAPS
25.0000 mg | ORAL_CAPSULE | ORAL | Status: AC
  Administered 2014-03-22 (×2): 25 mg via ORAL
  Filled 2014-03-19 (×2): qty 1

## 2014-03-19 MED ORDER — LORAZEPAM 1 MG PO TABS: 0.0000 mg | ORAL_TABLET | Freq: Two times a day (BID) | ORAL | Status: DC

## 2014-03-19 NOTE — BH Assessment (Signed)
Spoke with ED Dr.Walden to obtain clinicals prior to assessing patient. Spoke with nursing secretary Meriam SpragueBeverly who will set up Tele-psych cart.   Glorious PeachNajah Sudais Banghart, MS, LCASA Assessment Counselor

## 2014-03-19 NOTE — ED Notes (Signed)
Notified RN of CBG 231 

## 2014-03-19 NOTE — BH Assessment (Signed)
[facility] with [facility] CoyerEric Kaplan and Morrell RiddleLaura Davis,NP whom are in agreement that patient meets inpatient treatment criteria. Per Minerva AreolaEric patient's Glucose levels need to be addressed and the recommendation is for patient to be started on medication to address blood sugar.  Spoke with ED nurse Clydie BraunKaren whom is in agreement with consulting with EDP to address these concerns regarding pt's glucose levels. [facility] once she is medically cleared and concerns noted have been addressed.   Glorious PeachNajah Racquelle Hyser, MS, LCASA Assessment Counselor

## 2014-03-19 NOTE — ED Notes (Signed)
Patient has arrived on pod c. Accompanied by sitter

## 2014-03-19 NOTE — ED Provider Notes (Signed)
CSN: [id number]     Arrival date & time 03/19/14  [id number] History   First MD Initiated Contact with Patient 03/19/14 0845     Chief Complaint  Patient presents with  . Suicidal  . hearing voices   . Alcohol Problem     (Consider location/radiation/quality/duration/timing/severity/associated sxs/prior Treatment) Patient is a 53 y.o. female presenting with alcohol problem and mental health disorder. The history is provided by the patient.  Alcohol Problem This is a chronic problem. Pertinent negatives include no abdominal pain and no shortness of breath.  Mental Health Problem Presenting symptoms: depression and suicidal thoughts   Degree of incapacity (severity):  Severe Onset quality:  Gradual Timing:  Constant Progression:  Worsening Chronicity:  Chronic Context: alcohol use, drug abuse and noncompliance   Treatment compliance:  Untreated Relieved by:  Nothing Worsened by:  Nothing tried Associated symptoms: no abdominal pain     Past Medical History  Diagnosis Date  . Diabetes mellitus without complication   . Hypertension   . Depression    Past Surgical History  Procedure Laterality Date  . Cholecystectomy     History reviewed. No pertinent family history. History  Substance Use Topics  . Smoking status: Current Every Day Smoker  . Smokeless tobacco: Not on file  . Alcohol Use: Yes     Comment: as much as she can for the last month   OB History   Grav Para Term Preterm Abortions TAB SAB Ect Mult Living                 Review of Systems  Constitutional: Negative for fever and chills.  Respiratory: Negative for cough and shortness of breath.   Gastrointestinal: Negative for vomiting and abdominal pain.  Psychiatric/Behavioral: Positive for suicidal ideas.  All other systems reviewed and are negative.     Allergies  Aspirin  Home Medications   Prior to Admission medications   Not on File   BP 161/87  Pulse 116  Temp(Src) 98.7 F (37.1 C)  Resp  18  Wt 153 lb (69.4 kg)  SpO2 100% Physical Exam  Nursing note and vitals reviewed. Constitutional: She is oriented to person, place, and time. She appears well-developed and well-nourished. No distress.  HENT:  Head: Normocephalic and atraumatic.  Mouth/Throat: Oropharynx is clear and moist.  Eyes: EOM are normal. Pupils are equal, round, and reactive to light.  Neck: Normal range of motion. Neck supple.  Cardiovascular: Normal rate and regular rhythm.  Exam reveals no friction rub.   No murmur heard. Pulmonary/Chest: Effort normal and breath sounds normal. No respiratory distress. She has no wheezes. She has no rales.  Abdominal: Soft. She exhibits no distension. There is no tenderness. There is no rebound.  Musculoskeletal: Normal range of motion. She exhibits no edema.  Neurological: She is alert and oriented to person, place, and time.  Skin: She is not diaphoretic.  Psychiatric: Her speech is not rapid and/or pressured. She is actively hallucinating (hearing voices). She is not agitated and not aggressive. She exhibits a depressed mood.    ED Course  Procedures (including critical care time) Labs Review Labs Reviewed  ACETAMINOPHEN LEVEL  CBC  COMPREHENSIVE METABOLIC PANEL  ETHANOL  SALICYLATE LEVEL  URINE RAPID DRUG SCREEN (HOSP PERFORMED)    Imaging Review No results found.   EKG Interpretation   Date/Time:  Wednesday March 19 2014 08:38:12 EDT Ventricular Rate:  110 PR Interval:  128 QRS Duration: 82 QT Interval:  346 QTC Calculation: 468  R Axis:   60 Text Interpretation:  Sinus tachycardia Cannot rule out Anterior infarct ,  age undetermined Similar to prior Confirmed by [facility]  MD, Zamari Bonsall [phone]) on  03/19/2014 8:45:54 AM      MDM   Final diagnoses:  Suicidal behavior    86F here with suicidality, hearing voices. Noncompliant for years. Has plan to walk in front of traffic.  Also reports heavy drinking and crack cocaine use. Hx of self-harm with  wrist cutting. AFVSS here. Suicidal, crying. Will consult TTS and check labs. CIWA protocol initiated. Hyperglycemic, labs otherwise ok. Patient started on Metformin, 500 mg BID. She reports taking that medicine, unable to verify with any old records. Psych to admit.  Elwin MochaBlair Telesia Ates, MD 03/19/14 1154

## 2014-03-19 NOTE — ED Notes (Signed)
[facility]
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
## 2014-03-19 NOTE — Progress Notes (Signed)
Pt did not attend group this evening.  

## 2014-03-19 NOTE — Progress Notes (Signed)
Patient ID: Natasha MeadCynthia V Blust, female   DOB: Nov 25, 1960, 53 y.o.   MRN: [id number]  [name] is a 53 year old female [facility] for suicidal ideation with auditory and visual hallucinations. Patient is also suffering from substance abuse. Patient reports passive SI but can contract for safety and denies HI. Patient reports that she hears mumbles on admission but does hear voices that are command as well PTA. Patient does see shadows at times. Patient has a past medical history of hypertension and diabetes. Patient reports feeling off-balance when walking, and has fallen multiple times PTA. Patient was advised to use safety when walking and getting out of bed to prevent falls. Patient is a high fall risk at this time. Writer oriented patient to the units and gave patient a snack until dinner. Patient verbalized understanding of admission process. Q15 minute safety checks were initiated and are maintained. No distress at this time.

## 2014-03-19 NOTE — ED Notes (Signed)
Diet ordered 

## 2014-03-19 NOTE — ED Notes (Signed)
Dr Gwendolyn Grantwalden notified to dispp pt to [facility] to dr eappen service. Pt has a ready bed per eric at [facility]. Room 508-1

## 2014-03-19 NOTE — ED Notes (Signed)
Per pt sts she is having thoughts of suicide and doesn't want live anymore. sts she is a diabetic and hasn't taken her medication in over a month.

## 2014-03-19 NOTE — Tx Team (Signed)
Initial Interdisciplinary Treatment Plan   PATIENT STRESSORS: Financial difficulties Health problems Substance abuse   PROBLEM LIST: Problem List/Patient Goals Date to be addressed Date deferred Reason deferred Estimated date of resolution  Sustance Abuse 03/19/2014           Psychosis 03/19/2014           Depression 03/19/2014           Suicidal Ideation 03/19/2014                  DISCHARGE CRITERIA:  Verbal commitment to aftercare and medication compliance Withdrawal symptoms are absent or subacute and managed without 24-hour nursing intervention  PRELIMINARY DISCHARGE PLAN: Attend PHP/IOP Attend 12-step recovery group  PATIENT/FAMIILY INVOLVEMENT: This treatment plan has been presented to and reviewed with the patient, Robin Montgomery.  The patient has been given the opportunity to ask questions and make suggestions.  Marzetta BoardDopson, Koree Staheli E 03/19/2014, 5:54 PM

## 2014-03-19 NOTE — ED Notes (Signed)
Pt also c/o bilateral hand pain-- has nodules on both wrists.

## 2014-03-19 NOTE — ED Notes (Addendum)
[facility] recommends getting pt started on meds, and blood sugar is treated. Call after meds are started-- [facility]erv[facility] AreolaEric [facility].

## 2014-03-19 NOTE — BH Assessment (Signed)
TTS assessment completed.  Sevrin Sally, MS, LCASA Assessment Counselor  
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
## 2014-03-19 NOTE — ED Notes (Signed)
telepsych being done at present

## 2014-03-20 DIAGNOSIS — F1590 Other stimulant use, unspecified, uncomplicated: Secondary | ICD-10-CM

## 2014-03-20 DIAGNOSIS — F1490 Cocaine use, unspecified, uncomplicated: Secondary | ICD-10-CM

## 2014-03-20 DIAGNOSIS — F329 Major depressive disorder, single episode, unspecified: Secondary | ICD-10-CM

## 2014-03-20 LAB — GLUCOSE, CAPILLARY
Glucose-Capillary: 406 mg/dL — ABNORMAL HIGH (ref 70–99)
Glucose-Capillary: 183 mg/dL — ABNORMAL HIGH (ref 70–99)
Glucose-Capillary: 80 mg/dL (ref 70–99)

## 2014-03-20 LAB — TSH: TSH: 1.33 u[IU]/mL (ref 0.350–4.500)

## 2014-03-20 LAB — LIPID PANEL
Cholesterol: 226 mg/dL — ABNORMAL HIGH (ref 0–200)
HDL: 59 mg/dL (ref >39)
LDL Cholesterol: 127 mg/dL — ABNORMAL HIGH (ref 0–99)
Total CHOL/HDL Ratio: 3.8 RATIO
Triglycerides: 200 mg/dL — ABNORMAL HIGH (ref <150)
VLDL: 40 mg/dL (ref 0–40)

## 2014-03-20 LAB — HEMOGLOBIN A1C
Hgb A1c MFr Bld: 9.7 % — ABNORMAL HIGH (ref <5.7)
Mean Plasma Glucose: 232 mg/dL — ABNORMAL HIGH (ref <117)

## 2014-03-20 MED ORDER — INSULIN ASPART 100 UNIT/ML ~~LOC~~ SOLN
5.0000 [IU] | Freq: Once | SUBCUTANEOUS | Status: AC
  Administered 2014-03-20: 5 [IU] via SUBCUTANEOUS

## 2014-03-20 MED ORDER — GABAPENTIN 300 MG PO CAPS
300.0000 mg | ORAL_CAPSULE | Freq: Three times a day (TID) | ORAL | Status: DC
  Administered 2014-03-20 – 2014-03-24 (×12): 300 mg via ORAL
  Filled 2014-03-20 (×19): qty 1

## 2014-03-20 MED ORDER — INSULIN GLARGINE 100 UNIT/ML ~~LOC~~ SOLN
10.0000 [IU] | Freq: Every day | SUBCUTANEOUS | Status: DC
  Administered 2014-03-20 – 2014-03-22 (×3): 10 [IU] via SUBCUTANEOUS
  Administered 2014-03-23: 21:00:00 via SUBCUTANEOUS
  Administered 2014-03-24 – 2014-03-26 (×3): 10 [IU] via SUBCUTANEOUS

## 2014-03-20 MED ORDER — INSULIN ASPART 100 UNIT/ML ~~LOC~~ SOLN
0.0000 [IU] | Freq: Three times a day (TID) | SUBCUTANEOUS | Status: DC
  Administered 2014-03-20: 12:00:00 via SUBCUTANEOUS

## 2014-03-20 MED ORDER — INSULIN ASPART 100 UNIT/ML ~~LOC~~ SOLN
0.0000 [IU] | Freq: Three times a day (TID) | SUBCUTANEOUS | Status: DC
  Administered 2014-03-21: 8 [IU] via SUBCUTANEOUS
  Administered 2014-03-21: 15 [IU] via SUBCUTANEOUS
  Administered 2014-03-21: 3 [IU] via SUBCUTANEOUS
  Administered 2014-03-22: 11 [IU] via SUBCUTANEOUS
  Administered 2014-03-22: 8 [IU] via SUBCUTANEOUS
  Administered 2014-03-22: 5 [IU] via SUBCUTANEOUS
  Administered 2014-03-23 (×2): 8 [IU] via SUBCUTANEOUS
  Administered 2014-03-23: 3 [IU] via SUBCUTANEOUS
  Administered 2014-03-24: 2 [IU] via SUBCUTANEOUS
  Administered 2014-03-24: 8 [IU] via SUBCUTANEOUS
  Administered 2014-03-24: 11 [IU] via SUBCUTANEOUS
  Administered 2014-03-25: 5 [IU] via SUBCUTANEOUS
  Administered 2014-03-25: 3 [IU] via SUBCUTANEOUS
  Administered 2014-03-25: 8 [IU] via SUBCUTANEOUS
  Administered 2014-03-26 (×3): 3 [IU] via SUBCUTANEOUS
  Administered 2014-03-27: 2 [IU] via SUBCUTANEOUS
  Administered 2014-03-27: 07:00:00 via SUBCUTANEOUS

## 2014-03-20 MED ORDER — DOCUSATE SODIUM 100 MG PO CAPS
100.0000 mg | ORAL_CAPSULE | Freq: Two times a day (BID) | ORAL | Status: DC
  Administered 2014-03-20 – 2014-03-27 (×14): 100 mg via ORAL
  Filled 2014-03-20 (×18): qty 1

## 2014-03-20 MED ORDER — TRAZODONE HCL 50 MG PO TABS
50.0000 mg | ORAL_TABLET | Freq: Every day | ORAL | Status: DC
  Administered 2014-03-20: 50 mg via ORAL
  Filled 2014-03-20 (×2): qty 1

## 2014-03-20 MED ORDER — SIMVASTATIN 20 MG PO TABS
20.0000 mg | ORAL_TABLET | Freq: Every day | ORAL | Status: DC
  Administered 2014-03-20 – 2014-03-26 (×7): 20 mg via ORAL
  Filled 2014-03-20 (×5): qty 1
  Filled 2014-03-20: qty 14
  Filled 2014-03-20 (×4): qty 1

## 2014-03-20 MED ORDER — SERTRALINE HCL 25 MG PO TABS
25.0000 mg | ORAL_TABLET | Freq: Every day | ORAL | Status: DC
  Administered 2014-03-20 – 2014-03-21 (×2): 25 mg via ORAL
  Filled 2014-03-20 (×4): qty 1

## 2014-03-20 MED ORDER — ARIPIPRAZOLE 5 MG PO TABS
5.0000 mg | ORAL_TABLET | Freq: Every evening | ORAL | Status: DC
  Administered 2014-03-20: 5 mg via ORAL
  Filled 2014-03-20 (×2): qty 1

## 2014-03-20 NOTE — Plan of Care (Signed)
Problem: Food- and Nutrition-Related Knowledge Deficit (NB-1.1) Goal: Nutrition education Formal process to instruct or train a patient/client in a skill or to impart knowledge to help patients/clients voluntarily manage or modify food choices and eating behavior to maintain or improve health. Outcome: Completed/Met Date Met:  03/20/14  RD consulted for nutrition education regarding diabetes.     Lab Results  Component Value Date    HGBA1C  Value: 6.2 (NOTE)                                                                       According to the ADA Clinical Practice Recommendations for 2011, when HbA1c is used as a screening test:   >=6.5%   Diagnostic of Diabetes Mellitus           (if abnormal result  is confirmed)  5.7-6.4%   Increased risk of developing Diabetes Mellitus  References:Diagnosis and Classification of Diabetes Mellitus,Diabetes CXFQ,7225,75(YNXGZ 1):S62-S69 and Standards of Medical Care in         Diabetes - 2011,Diabetes Care,2011,34  (Suppl 1):S11-S61.* 01/07/2010    RD provided "Carbohydrate Counting for People with Diabetes" handout from the Academy of Nutrition and Dietetics. Discussed different food groups and their effects on blood sugar, emphasizing carbohydrate-containing foods. Provided list of carbohydrates and recommended serving sizes of common foods.  Pt reports being illiterate. Also provided colorful and visual based handout "Plate Method Menu Ideas".   Pt has had diabetes for 1-2 years.   Discussed importance of controlled and consistent carbohydrate intake throughout the day. Provided examples of ways to balance meals/snacks and encouraged intake of high-fiber, whole grain complex carbohydrates. Teach back method used.  Expect fair compliance.    Body mass index is 26.98 kg/(m^2). Pt meets criteria for overweight based on current BMI.  Current diet order is carb modified.Labs and medications reviewed. No further nutrition interventions warranted at this time.  RD contact information provided. If additional nutrition issues arise, please re-consult RD.  Clayton Bibles, MS, RD, LDN Pager: [phone] After Hours Pager: [phone]

## 2014-03-20 NOTE — Progress Notes (Signed)
Patient unsteady, weak and reports frequent falls prior to admit. Asking for cane. Pt also displays some confusion. MD met with patient. 1:1 obs order received along with order for [place]. Both in place. Pt verbalized understanding of plan. Robin Montgomery

## 2014-03-20 NOTE — [facility] Group Notes (Signed)
[facility] Group Notes:  (Nursing/MHT/Case Management/Adjunct)  Date:  03/20/2014  Time:  10:30am   Type of Therapy:  Nurse Education  Participation Level:  Did Not Attend  Participation Quality:      Affect:    Cognitive:    Insight:    Engagement in Group:    Modes of Intervention:    Summary of Progress/Problems:  Lawrence MarseillesFriedman, Tully Mcinturff Eakes 03/20/2014, 2:11 PM

## 2014-03-20 NOTE — [facility] Group Notes (Signed)
Adult Psychoeducational Group Note  Date:  03/20/2014 Time:  8:56 PM  Group Topic/Focus:  Wrap-Up Group:   The focus of this group is to help patients review their daily goal of treatment and discuss progress on daily workbooks.  Participation Level:  Active  Participation Quality:  Appropriate  Affect:  Appropriate  Cognitive:  Appropriate  Insight: Appropriate  Engagement in Group:  Engaged  Modes of Intervention:  Discussion  Additional Comments:  Pt stated that she had better day than yesterday. Pt stated that her goal was was to interact more with the patient and she did.  Berlin HunWatlington, Eulogia Dismore A 03/20/2014, 8:56 PM.
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
## 2014-03-20 NOTE — Progress Notes (Signed)
Inpatient Diabetes Program Recommendations  AACE/ADA: New Consensus Statement on Inpatient Glycemic Control (2013)  Target Ranges:  Prepandial:   less than 140 mg/dL      Peak postprandial:   less than 180 mg/dL (1-2 hours)      Critically ill patients:  140 - 180 mg/dL  Results for Robin Montgomery, [name] (MRN [id number]) as of 03/20/2014 08:36  Ref. Range 03/19/2014 12:22 03/19/2014 16:59 03/19/2014 20:37 03/20/2014 07:51  Glucose-Capillary Latest Range: 70-99 mg/dL 409231 (H) 811275 (H) 914268 (H) 406 (H)   Inpatient Diabetes Program Recommendations Insulin - Basal: add Lantus 10 units  Correction (SSI): Increase to moderate scale TID  HgbA1C: needs A1C to assess prehospital glucose control  .Thank you  Piedad ClimesGina Junius Faucett BSN, RN,CDE Inpatient Diabetes Coordinator [phone] (team pager)

## 2014-03-20 NOTE — Progress Notes (Signed)
Patient flat, anxious, depressed though pleasant in her interactions. Up and visible in the unit. She endorses command hallucinations to harm herself however verbally contracts for safety. No HI/VH. Complaining of pain bilateral hands of a 10/10. Patient's CBG this morning was 406 however was taken after breakfast. MD and NP notified. Order received for 5u novolog. Diabetic educator in to see patient (see progress note). Reviewed diet teaching with patient as well as insulin admin techniques. Patient verbalized some understanding however she does exhibit some mild confusion. Fall teaching completed, precautions in place. Patient indicates she may not want to continue insulin upon discharge as she has never taken it and checks her CBGs infrequently. Patient admits to being non compliant with diet. Pain reassessed and is an 8/10. Patient allergic to ASA therefore pain med options are limited. Consulted with MD and pharmacy. At this time tylenol prn with heat/cold packs will be offered.  Lawrence MarseillesFriedman, Darletta Noblett Eakes

## 2014-03-20 NOTE — [facility] Group Notes (Signed)
[facility] Group Notes:  (Counselor/Nursing/MHT/Case Management/Adjunct)  03/20/2014 1:15PM  Type of Therapy:  Group Therapy  Participation Level:  Active  Participation Quality:  Appropriate  Affect:  Flat  Cognitive:  Oriented  Insight:  Improving  Engagement in Group:  Limited  Engagement in Therapy:  Limited  Modes of Intervention:  Discussion, Exploration and Socialization  Summary of Progress/Problems: The topic for group was balance in life.  Pt participated in the discussion about when their life was in balance and out of balance and how this feels.  Pt discussed ways to get back in balance and short term goals they can work on to get where they want to be. Pt was in group for second half.  Identified that she is out of balance as she has been isolating in her room alone due to depression, drinking alcohol and doing drugs, which is leading to increased depression as well as psychosis, though later states that psychosis never completely leaves.  She believes that getting into a rehab program from here will help her with regaining balance in her life.   Daryel Geraldorth, Robin Montgomery 03/20/2014 2:10 PM

## 2014-03-20 NOTE — Progress Notes (Signed)
Patient ID: Robin Montgomery, female   DOB: Jun 27, 1960, 53 y.o.   MRN: [id number] 1-1 monitoring Note. D. Patient remains very confused, disoriented x 4 stating ''it's 1997 and [name] is the president. '' Patient states ''it's summer outside '' when asking orientation questions. She continues to require 1.1. Monitoring for high fall risk, and confusion. A. 1.1. Remains at bedside. R. Patient is safe at this time, in no acute distress, with no further voiced concerns. Will continue to monitor 1.1. As ordered.
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
## 2014-03-20 NOTE — Progress Notes (Signed)
Pt alert, oriented and cooperative. +SI, verbally contracts for safety inside the hospital only, -HI, +A/ hall "telling me to hurt myself"; +V/hall" I see shadows" . Affect/mood sad and depressed. C/o feeling hopeless and helpless.  Minimum interaction with peers or staff. Denies pain or physical discomfort. Emotional support and encouragement given. Will continue to monitor closely and evaluate for stabilization.

## 2014-03-20 NOTE — Progress Notes (Signed)
Nursing 1:1 note D:Pt observed in dayroom . RR even and unlabored. No distress noted. A: 1:1 observation continues for safety  R: pt remains safe  

## 2014-03-20 NOTE — [facility] Suicide Risk Assessment (Signed)
   Nursing information obtained from:  Patient Demographic factors:  Low socioeconomic status;Unemployed Current Mental Status:  Suicidal ideation indicated by patient Loss Factors:  Decline in physical health;Financial problems / change in socioeconomic status Historical Factors:  Family history of mental illness or substance abuse Risk Reduction Factors:  Sense of responsibility to family Total Time spent with patient: 45 minutes  CLINICAL FACTORS:   Depression:   Hopelessness Impulsivity Alcohol/Substance Abuse/Dependencies Currently Psychotic  Psychiatric Specialty Exam: Physical Exam Please see H&P.   ROS Please see H&P.   Blood pressure 114/60, pulse 93, temperature 97.3 F (36.3 C), temperature source Oral, resp. rate 18, height 5' 1.25" (1.556 m), weight 65.318 kg (144 lb).Body mass index is 26.98 kg/(m^2).  General Appearance: Casual  Eye Contact::  Fair  Speech:  Normal Rate  Volume:  Normal  Mood:  Anxious and Depressed  Affect:  Congruent  Thought Process:  Irrelevant  Orientation:  Other:  Oriented to person ,place,not to time  Thought Content:  Hallucinations: Auditory Visual  Suicidal Thoughts:  Yes.  without intent/plan  Homicidal Thoughts:  No  Memory:  Immediate;   Fair Recent;   Fair Remote;   Fair  Judgement:  Impaired  Insight:  Lacking  Psychomotor Activity:  Decreased  Concentration:  Fair  Recall:  Fair  Fund of Knowledge:Good  Language: Good  Akathisia:  No  Handed:  Right  AIMS (if indicated):     Assets:  Communication Skills Desire for Improvement Social Support  Sleep:  Number of Hours: 5.5   Musculoskeletal: Strength & Muscle Tone: within normal limits Gait & Station: unsteady Patient leans: N/A  COGNITIVE FEATURES THAT CONTRIBUTE TO RISK:  Polarized thinking Thought constriction (tunnel vision)    SUICIDE RISK:   Severe:  Frequent, intense, and enduring suicidal ideation, specific plan, no subjective intent, but some  objective markers of intent (i.e., choice of lethal method), the method is accessible, some limited preparatory behavior, evidence of impaired self-control, severe dysphoria/symptomatology, multiple risk factors present, and few if any protective factors, particularly a lack of social support.  PLAN OF CARE:Please see H&P.   I certify that inpatient services furnished can reasonably be expected to improve the patient's condition.  Caia Lofaro 03/20/2014, 3:06 PM

## 2014-03-20 NOTE — Progress Notes (Signed)
Pt resting in room. Has been visible on unit, attended afternoon group. No new concerns. Continues to complain of bilateral hand pain. Heat packs provided. 1:1 obs in place for fall risk. She remains safe at this time. Lawrence MarseillesFriedman, Brennen Gardiner Eakes

## 2014-03-21 DIAGNOSIS — F1994 Other psychoactive substance use, unspecified with psychoactive substance-induced mood disorder: Secondary | ICD-10-CM

## 2014-03-21 LAB — GLUCOSE, CAPILLARY
Glucose-Capillary: 227 mg/dL — ABNORMAL HIGH (ref 70–99)
Glucose-Capillary: 270 mg/dL — ABNORMAL HIGH (ref 70–99)
Glucose-Capillary: 183 mg/dL — ABNORMAL HIGH (ref 70–99)
Glucose-Capillary: 423 mg/dL — ABNORMAL HIGH (ref 70–99)
Glucose-Capillary: 165 mg/dL — ABNORMAL HIGH (ref 70–99)
Glucose-Capillary: 75 mg/dL (ref 70–99)

## 2014-03-21 MED ORDER — SERTRALINE HCL 50 MG PO TABS
50.0000 mg | ORAL_TABLET | Freq: Every day | ORAL | Status: DC
  Administered 2014-03-22 – 2014-03-24 (×3): 50 mg via ORAL
  Filled 2014-03-21 (×5): qty 1

## 2014-03-21 MED ORDER — TRAZODONE HCL 100 MG PO TABS
100.0000 mg | ORAL_TABLET | Freq: Every day | ORAL | Status: DC
Start: 2014-03-21 — End: 2014-03-27
  Administered 2014-03-21 – 2014-03-26 (×6): 100 mg via ORAL
  Filled 2014-03-21 (×4): qty 1
  Filled 2014-03-21: qty 14
  Filled 2014-03-21 (×3): qty 1

## 2014-03-21 MED ORDER — ARIPIPRAZOLE 10 MG PO TABS
10.0000 mg | ORAL_TABLET | Freq: Every evening | ORAL | Status: DC
  Administered 2014-03-21 – 2014-03-23 (×3): 10 mg via ORAL
  Filled 2014-03-21 (×4): qty 1

## 2014-03-21 NOTE — Progress Notes (Signed)
Patient ID: Natasha MeadCynthia V Bearce, female   DOB: 04-02-1961, 53 y.o.   MRN: [id number] 1-1 Monitoring Note. D. Pt remains 1.1. Monitoring for safety. She remains very confused, and with unsteady gait. Pt using wheelchair on the unit with staff assistance. A. 1.1. At arms length. R. Patient is safe. Will continue to monitor 1.1. As ordered.

## 2014-03-21 NOTE — Progress Notes (Signed)
Patient ID: Robin MeadCynthia V Montgomery, female   DOB: January 03, 1961, 53 y.o.   MRN: [id number] D. Pt cbg 423 . Notified Dr. Freddy JakschJonnalgadda of above information. Orders received to give 15 units insulin per sliding scale orders and continue to monitor CBG as ordered. R. Will continue to monitor as ordered.

## 2014-03-21 NOTE — Progress Notes (Signed)
Patient ID: Robin MeadCynthia V Montgomery, female   DOB: 08/29/1960, 53 y.o.   MRN: [id number] 1-1 Monitoring Note. D. Pt remains 1.1. Monitoring for safety. She remains confused at times, but is able to be verbally redirected. Robin BeechamCynthia continues to have very unsteady gait. Pt using wheelchair on the unit with staff assistance. A. 1.1. At arms length. R. Patient is safe. Will continue to monitor 1.1. As ordered.

## 2014-03-21 NOTE — Evaluation (Signed)
Physical Therapy Evaluation Patient Details Name: Robin MeadCynthia V Montgomery MRN: [id number] DOB: Dec 10, 1960 Today's Date: 03/21/2014   History of Present Illness  53 yo with depression,substance induced mood disorder,alcohol,cocaine use disorder   Clinical Impression  Pt very lethargic today , nursing reported likely due to detox and meds. Pt still very weak with movment, slow, and reports pain in B feet and knees. Unsteady on feet and used RW but very poor tolerance to activity today. To benefit from PT to continue to get stronger and hopefully improve safety with ambulation .     Follow Up Recommendations Home health PT    Equipment Recommendations  Rolling Poirier with 5" wheels    Recommendations for Other Services       Precautions / Restrictions Precautions Precautions: Fall Restrictions Weight Bearing Restrictions: No      Mobility  Bed Mobility                  Transfers Overall transfer level: Needs assistance   Transfers: Sit to/from Stand Sit to Stand: Min guard         General transfer comment: generalized unsteady,   Ambulation/Gait Ambulation/Gait assistance: Min assist Ambulation Distance (Feet): 20 Feet Assistive device: Rolling Lourenco (2 wheeled) Gait Pattern/deviations: Trunk flexed;Narrow base of support Gait velocity: slow   General Gait Details: slow, weak, sway like . also stood at nurses window with UE support for about 3 minutes nd then take was all pt could tolerate before having to get WC for pt to sit in. Pt very weak.   Stairs            Wheelchair Mobility    Modified Rankin (Stroke Patients Only)       Balance                                             Pertinent Vitals/Pain Pain Assessment:  (pt staates she has B foot pain, numbness adn burning on bottom of both feet . Nurse let pt know that pat of the detox process sometimes causes some neuropathy. he also states her knees her too. (veery general and  vague with descriptions))    Home Living Family/patient expects to be discharged to:: Other (Comment) (lives in a hotel with her teanager children) Living Arrangements: Children               Additional Comments: children's father does come by to help with the kids    Prior Function Level of Independence: Needs assistance         Comments: pt states she needs assistance but a very poor historian and hard to Montenegroundestand what she means and what may be true PLOF. She stated she alwasy needs help to walk.      Hand Dominance        Extremity/Trunk Assessment               Lower Extremity Assessment: Generalized weakness (pt moved extremities very slow, but difficulty to assess why , possibly due to lethargic state.)         Communication   Communication: No difficulties (very groggy , likely from meds reports the nurse, hard to hold her eyes open during exam)  Cognition Arousal/Alertness: Lethargic Behavior During Therapy: WFL for tasks assessed/performed Overall Cognitive Status: Difficult to assess  General Comments      Exercises        Assessment/Plan    PT Assessment Patient needs continued PT services  PT Diagnosis Difficulty walking;Generalized weakness   PT Problem List Decreased strength;Decreased activity tolerance;Decreased balance;Decreased mobility;Decreased knowledge of use of DME  PT Treatment Interventions DME instruction;Gait training;Functional mobility training;Therapeutic exercise;Therapeutic activities   PT Goals (Current goals can be found in the Care Plan section) Acute Rehab PT Goals Patient Stated Goal: pt did want to be able to walk again PT Goal Formulation: With patient Time For Goal Achievement: 04/04/14 Potential to Achieve Goals: Good    Frequency Min 2X/week   Barriers to discharge        Co-evaluation               End of Session   Activity Tolerance: Patient limited by  lethargy Patient left: in chair;with nursing/sitter in room Nurse Communication: Mobility status    Functional Assessment Tool Used: clinical judgement Functional Limitation: Mobility: Walking and moving around Mobility: Walking and Moving Around Current Status ([id number]([id number]): At least 1 percent but less than 20 percent impaired, limited or restricted Mobility: Walking and Moving Around Goal Status [phone](G8979): 0 percent impaired, limited or restricted    Time: 1545-1605 PT Time Calculation (min): 20 min   Charges:   PT Evaluation $Initial PT Evaluation Tier I: 1 Procedure PT Treatments $Gait Training: 8-22 mins   PT G Codes:   Functional Assessment Tool Used: clinical judgement Functional Limitation: Mobility: Walking and moving around    [place] SpringsBRITT, [facility] CenterHARRON 03/21/2014, 6:44 PM Marella BileSharron Chancy Claros, PT Pager: [phone] 03/21/2014

## 2014-03-21 NOTE — [facility] Group Notes (Signed)
Adult Psychoeducational Group Note  Date:  03/21/2014 Time:  9:27 PM  Group Topic/Focus:  Wrap-Up Group:   The focus of this group is to help patients review their daily goal of treatment and discuss progress on daily workbooks.  Participation Level:  Minimal  Participation Quality:  Drowsy  Affect:  Flat  Cognitive:  Appropriate  Insight: Limited  Engagement in Group:  Limited  Modes of Intervention:  Discussion  Additional Comments:  Robin BeechamCynthia stated her day was all right.  She expressed that she went to some groups and wants to work on dealing with people.  As Montgomery coping skill, she reads the Bible.  Robin RancherLindsay, Robin Montgomery 03/21/2014, 9:27 PM

## 2014-03-21 NOTE — Progress Notes (Addendum)
[facility] MD Progress Note  03/21/2014 3:23 PM Robin Montgomery  MRN:  [id number] Subjective:Patient states ,' I am OK ,but I still feel the same ,voices are mumbling and I feel suicidal". Objective:Patient seen and chart reviewed. Patient appears to be depressed ,anxious with psychomotor retardation. Patient is also confused ,reports time (year) as 45 and month as September. Patient continues to report voices mumbling to her. Patient is passively suicidal and continues to be on 1;1 2/2 safety as well as due to high fall risk. Patient uses a Giorgio to walk. Patient reports sleep as poor and appetite as fair. Patient denies HI ,but continues to see shadows. Discussed with patient that she is withdrawing from alcohol and that her current symptoms could be related to her withdrawal. Patient is compliant on medications. Patient denies any side effects.    Diagnosis:   DSM5: Primary Psychiatric Diagnosis:  Alcohol use disorder,severe   Secondary Psychiatric Diagnosis:  Alcohol withdrawal with perceptual disturbance (on CIWA protocol)  Cocaine use disorder,severe  Substance (alcohol,cocaine) induced depressive disorder  R/O PTSD   Non Psychiatric Diagnosis:  DM  HTN        Total Time spent with patient: 30 minutes   ADL's:  Impaired  Sleep: Poor  Appetite:  Fair   Psychiatric Specialty Exam: Physical Exam  ROS  Blood pressure 129/74, pulse 96, temperature 97.9 F (36.6 C), temperature source Oral, resp. rate 18, height 5' 1.25" (1.556 m), weight 65.318 kg (144 lb).Body mass index is 26.98 kg/(m^2).  General Appearance: Disheveled  Eye Contact::  Minimal  Speech:  Slow  Volume:  Decreased  Mood:  Anxious, Depressed and Dysphoric  Affect:  Flat  Thought Process:  Disorganized and Irrelevant  Orientation:  Other:  to person,place ,not to time  Thought Content:  Hallucinations: Auditory Visual  Suicidal Thoughts:  Yes.  without intent/plan  Homicidal Thoughts:  No   Memory:  Immediate;   Fair Recent;   Fair Remote;   Fair  Judgement:  Impaired  Insight:  Lacking  Psychomotor Activity:  Decreased  Concentration:  Fair  Recall:  [employer] of Knowledge:Poor  Language: Good  Akathisia:  No  Handed:  Right  AIMS (if indicated):     Assets:  Desire for Improvement  Sleep:  Number of Hours: 6.5   Musculoskeletal: Strength & Muscle Tone: within normal limits Gait & Station: unsteady Patient leans: N/A  Current Medications: Current Facility-Administered Medications  Medication Dose Route Frequency Provider Last Rate Last Dose  . acetaminophen (TYLENOL) tablet 650 mg  650 mg Oral Q6H PRN Jomarie Longs, MD   650 mg at 03/20/14 1021  . alum & mag hydroxide-simeth (MAALOX/MYLANTA) 200-200-20 MG/5ML suspension 30 mL  30 mL Oral Q4H PRN Shahmeer Bunn, MD      . ARIPiprazole (ABILIFY) tablet 10 mg  10 mg Oral QPM Nygeria Lager, MD      . chlordiazePOXIDE (LIBRIUM) capsule 25 mg  25 mg Oral Q6H PRN Jomarie Longs, MD   25 mg at 03/21/14 0746  . chlordiazePOXIDE (LIBRIUM) capsule 25 mg  25 mg Oral TID Jomarie Longs, MD   25 mg at 03/21/14 1158   Followed by  . [START ON 03/22/2014] chlordiazePOXIDE (LIBRIUM) capsule 25 mg  25 mg Oral BH-qamhs Jomarie Longs, MD       Followed by  . [START ON 03/23/2014] chlordiazePOXIDE (LIBRIUM) capsule 25 mg  25 mg Oral Daily Dequann Vandervelden, MD      . docusate sodium (COLACE) capsule 100  mg  100 mg Oral BID Jomarie LongsSaramma Kosha Jaquith, MD   100 mg at 03/21/14 0745  . gabapentin (NEURONTIN) capsule 300 mg  300 mg Oral TID Jomarie LongsSaramma Derryck Shahan, MD   300 mg at 03/21/14 1158  . hydrOXYzine (ATARAX/VISTARIL) tablet 25 mg  25 mg Oral Q6H PRN Benett Swoyer, MD      . insulin aspart (novoLOG) injection 0-15 Units  0-15 Units Subcutaneous TID WC Jomarie LongsSaramma Sherry Blackard, MD   3 Units at 03/21/14 1200  . insulin glargine (LANTUS) injection 10 Units  10 Units Subcutaneous QHS Jomarie LongsSaramma Alexus Michael, MD   10 Units at 03/20/14 2214  . loperamide (IMODIUM) capsule  2-4 mg  2-4 mg Oral PRN Jomarie LongsSaramma Boaz Berisha, MD      . magnesium hydroxide (MILK OF MAGNESIA) suspension 30 mL  30 mL Oral Daily PRN Jomarie LongsSaramma Roemello Speyer, MD      . multivitamin with minerals tablet 1 tablet  1 tablet Oral Daily Jomarie LongsSaramma Demba Nigh, MD   1 tablet at 03/21/14 0745  . nicotine (NICODERM CQ - dosed in mg/24 hours) patch 21 mg  21 mg Transdermal Daily Jomarie LongsSaramma Nalini Alcaraz, MD   21 mg at 03/21/14 0747  . ondansetron (ZOFRAN-ODT) disintegrating tablet 4 mg  4 mg Oral Q6H PRN Jomarie LongsSaramma Notnamed Scholz, MD      . Melene Muller[START ON 03/22/2014] sertraline (ZOLOFT) tablet 50 mg  50 mg Oral Daily Carolanne Mercier, MD      . simvastatin (ZOCOR) tablet 20 mg  20 mg Oral q1800 Jerritt Cardoza, MD   20 mg at 03/20/14 1655  . thiamine (B-1) injection 100 mg  100 mg Intramuscular Once Solae Norling, MD      . thiamine (VITAMIN B-1) tablet 100 mg  100 mg Oral Daily Wilder Amodei, MD   100 mg at 03/21/14 0745  . traZODone (DESYREL) tablet 100 mg  100 mg Oral QHS Jomarie LongsSaramma Bray Vickerman, MD        Lab Results:  Results for orders placed during the hospital encounter of 03/19/14 (from the past 48 hour(s))  GLUCOSE, CAPILLARY     Status: Abnormal   Collection Time    03/19/14  4:59 PM      Result Value Ref Range   Glucose-Capillary 275 (*) 70 - 99 mg/dL   Comment 1 Notify RN    LIPID PANEL     Status: Abnormal   Collection Time    03/19/14  7:30 PM      Result Value Ref Range   Cholesterol 226 (*) 0 - 200 mg/dL   Triglycerides 161200 (*) <150 mg/dL   HDL 59  >09>39 mg/dL   Total CHOL/HDL Ratio 3.8     VLDL 40  0 - 40 mg/dL   LDL Cholesterol 604127 (*) 0 - 99 mg/dL   Comment:            Total Cholesterol/HDL:CHD Risk     Coronary Heart Disease Risk Table                         Men   Women      1/2 Average Risk   3.4   3.3      Average Risk       5.0   4.4      2 X Average Risk   9.6   7.1      3 X Average Risk  23.4   11.0  Use the calculated Patient Ratio     above and the CHD Risk Table     to determine the patient's CHD  Risk.                ATP III CLASSIFICATION (LDL):      <100     mg/dL   Optimal      [id number]  mg/dL   Near or Above                        Optimal      130-159  mg/dL   Borderline      [phone]  mg/dL   High      >956>190     mg/dL   Very High     [facility]  GLUCOSE, CAPILLARY     Status: Abnormal   Collection Time    03/19/14  8:37 PM      Result Value Ref Range   Glucose-Capillary 268 (*) 70 - 99 mg/dL  TSH     Status: None   Collection Time    03/20/14  6:18 AM      Result Value Ref Range   TSH 1.330  0.350 - 4.500 uIU/mL   Comment: [facility]  HEMOGLOBIN A1C     Status: Abnormal   Collection Time    03/20/14  6:18 AM      Result Value Ref Range   Hemoglobin A1C 9.7 (*) <5.7 %   Comment: (NOTE)                                                                               According to the ADA Clinical Practice Recommendations for 2011, when     HbA1c is used as a screening test:      >=6.5%   Diagnostic of Diabetes Mellitus               (if abnormal result is confirmed)     5.7-6.4%   Increased risk of developing Diabetes Mellitus     References:Diagnosis and Classification of Diabetes Mellitus,Diabetes     Care,2011,34(Suppl 1):S62-S69 and Standards of Medical Care in             Diabetes - 2011,Diabetes Care,2011,34 (Suppl 1):S11-S61.   Mean Plasma Glucose 232 (*) <117 mg/dL   Comment: [facility]  GLUCOSE, CAPILLARY     Status: Abnormal   Collection Time    03/20/14  7:51 AM      Result Value Ref Range   Glucose-Capillary 406 (*) 70 - 99 mg/dL   Comment 1 Documented in Chart     Comment 2 Notify RN    GLUCOSE, CAPILLARY     Status: Abnormal   Collection Time    03/20/14 11:32 AM      Result Value Ref Range   Glucose-Capillary 183 (*) 70 - 99 mg/dL   Comment 1 Documented in Chart     Comment 2 Notify RN    GLUCOSE, CAPILLARY     Status: None   Collection Time    03/20/14  4:45 PM  Result  Value Ref Range   Glucose-Capillary 80  70 - 99 mg/dL  GLUCOSE, CAPILLARY     Status: Abnormal   Collection Time    03/21/14  5:54 AM      Result Value Ref Range   Glucose-Capillary 270 (*) 70 - 99 mg/dL  GLUCOSE, CAPILLARY     Status: Abnormal   Collection Time    03/21/14 11:53 AM      Result Value Ref Range   Glucose-Capillary 183 (*) 70 - 99 mg/dL   Comment 1 Notify RN      Physical Findings: AIMS: Facial and Oral Movements Muscles of Facial Expression: None, normal Lips and Perioral Area: None, normal Jaw: None, normal Tongue: None, normal,Extremity Movements Upper (arms, wrists, hands, fingers): None, normal Lower (legs, knees, ankles, toes): None, normal, Trunk Movements Neck, shoulders, hips: None, normal, Overall Severity Severity of abnormal movements (highest score from questions above): None, normal Incapacitation due to abnormal movements: None, normal Patient's awareness of abnormal movements (rate only patient's report): No Awareness, Dental Status Current problems with teeth and/or dentures?: No Does patient usually wear dentures?: No  CIWA:  CIWA-Ar Total: 5 COWS:     Treatment Plan Summary: Daily contact with patient to assess and evaluate symptoms and progress in treatment Medication management  Plan: Patient will benefit from continued inpatient treatment and stabilization.  Reviewed past medical records,treatment plan.  Will increase Zoloft to 50 mg po daily ,she will get the higher dose tomorrow AM. Will increase Abilify to 10 mg po qhs tonight. Will continue Cogentin po for EPS. Trazodone increased to 100 mg po qhs. Continue Gabapentin 300 mg po tid. Will continue Librium protocol.  Patient appears to be confused about time , however is oriented to person ,place . Discussed with staff,sitter to orient person throughout the day.Likely 2/2 withdrawal. Will continue to monitor vitals ,medication compliance and treatment side effects while patient is  here.  Will monitor for medical issues as well as call consult as needed.  Reviewed labs ,will order as needed.  CSW will start working on disposition. Patient to be referred to a substance abuse program ,once stable. Patient to participate in therapeutic milieu .      Medical Decision Making Problem Points:  Established problem, stable/improving (1) Data Points:  Order Aims Assessment (2) Review of medication regiment & side effects (2)  I certify that inpatient services furnished can reasonably be expected to improve the patient's condition.   Laine Giovanetti MD 03/21/2014, 3:23 PM

## 2014-03-21 NOTE — Progress Notes (Signed)
Nursing 1:1 note D:Pt observed sleeping in bed with eyes closed. RR even and unlabored. No distress noted. A: 1:1 observation continues for safety  R: pt remains safe  

## 2014-03-21 NOTE — Progress Notes (Signed)
Patient ID: Robin MeadCynthia V Montgomery, female   DOB: 03-Jan-1961, 53 y.o.   MRN: [id number] 1-1 monitoring Note. D. Patient remains very confused, disoriented x 4 stating ''It's summer outside, it's 2011. Earl LitesJimmy Carter is the president. '' when asking orientation questions. She was reoriented and states '' Oh I can never keep up with that. When I hear the voices they confuse me.'' She does report withdrawal symptoms, including agitation,, nausea and cravings. She continues to require 1.1. Monitoring for high fall risk, and confusion. A. 1.1. Remains at bedside. Discussed above information with Dr. Elna BreslowEappen and patients altered sensorium.  R. Patient is safe at this time, in no acute distress, with no further voiced concerns. Will continue to monitor 1.1. As ordered.

## 2014-03-21 NOTE — [facility] Suicide Risk Assessment (Signed)
[facility] INPATIENT:  Family/Significant Other Suicide Prevention Education  Suicide Prevention Education:  Education Completed; Darcel SmallingDennis Mattier (father of children) [phone] has been identified by the patient as the family member/significant other with whom the patient will be residing, and identified as the person(s) who will aid the patient in the event of a mental health crisis (suicidal ideations/suicide attempt).  With written consent from the patient, the family member/significant other has been provided the following suicide prevention education, prior to the and/or following the discharge of the patient.  The suicide prevention education provided includes the following:  Suicide risk factors  Suicide prevention and interventions  National Suicide Hotline telephone number  [facility] assessment telephone number  [facility] Emergency Assistance 911  [facility] and/or Residential Mobile Crisis Unit telephone number  Request made of family/significant other to:  Remove weapons (e.g., guns, rifles, knives), all items previously/currently identified as safety concern.    Remove drugs/medications (over-the-counter, prescriptions, illicit drugs), all items previously/currently identified as a safety concern.  The family member/significant other verbalizes understanding of the suicide prevention education information provided.  The family member/significant other agrees to remove the items of safety concern listed above.  Smart, Eileen Kangas LCSWA  03/21/2014, 1:21 PM

## 2014-03-21 NOTE — [facility] Group Notes (Signed)
[facility] LCSW Group Therapy  03/21/2014  1:05 PM  Type of Therapy:  Group therapy  Participation Level:  Active  Participation Quality:  Attentive  Affect:  Flat  Cognitive:  Oriented  Insight:  Limited  Engagement in Therapy:  Limited  Modes of Intervention:  Discussion, Socialization  Summary of Progress/Problems:  Chaplain was here to lead a group on themes of hope and courage.  "Sometimes I have to support myself, but it's hard.  I wish I could count more on family, but I can't."  Left after 10 minutes.  Did not return.  Daryel Geraldorth, Timmy Bubeck B 03/21/2014 1:20 PM

## 2014-03-21 NOTE — Progress Notes (Signed)
[facility] Group Notes:  (Nursing/MHT/Case Management/Adjunct)  Date:  03/21/2014  Time:  1:28 PM  Type of Therapy:  Therapeutic Activity  Participation Level:  Active  Participation Quality:  Appropriate  Affect:  Appropriate  Cognitive:  Appropriate  Insight:  Appropriate  Engagement in Group:  Engaged  Modes of Intervention:  Activity  Summary of Progress/Problems: Pts played an activity of Pictionary using coping skills. Pt was engaged and actively participated.  Robin Montgomery C 03/21/2014, 1:28 PM 
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
## 2014-03-21 NOTE — Progress Notes (Signed)
Nursing 1:1 note D:Pt in shower. No distress noted. A: 1:1 observation continues for safety  R: pt remains safe

## 2014-03-22 DIAGNOSIS — R45851 Suicidal ideations: Secondary | ICD-10-CM

## 2014-03-22 DIAGNOSIS — F1494 Cocaine use, unspecified with cocaine-induced mood disorder: Secondary | ICD-10-CM

## 2014-03-22 DIAGNOSIS — F10239 Alcohol dependence with withdrawal, unspecified: Principal | ICD-10-CM

## 2014-03-22 LAB — GLUCOSE, CAPILLARY
Glucose-Capillary: 238 mg/dL — ABNORMAL HIGH (ref 70–99)
Glucose-Capillary: 273 mg/dL — ABNORMAL HIGH (ref 70–99)
Glucose-Capillary: 311 mg/dL — ABNORMAL HIGH (ref 70–99)
Glucose-Capillary: 256 mg/dL — ABNORMAL HIGH (ref 70–99)

## 2014-03-22 NOTE — Plan of Care (Signed)
Problem: Ineffective individual coping Goal: STG: Patient will remain free from self harm Outcome: Progressing Pt safe on unit  Problem: Alteration in mood Goal: LTG-Patient reports reduction in suicidal thoughts (Patient reports reduction in suicidal thoughts and is able to verbalize a safety plan for whenever patient is feeling suicidal)  Outcome: Progressing Pt denies SI  Problem: Alteration in thought process Goal: STG-Patient is able to follow short directions Outcome: Progressing Pt listens to Clinical research associatewriter and understands what is being said, AEB pt doing what is told.

## 2014-03-22 NOTE — [facility] Group Notes (Signed)
[facility] Group Notes:  (Clinical Social Work)  03/22/2014  11:15-12:00PM  Summary of Progress/Problems:   The main focus of today's process group was to discuss patients' feelings about hospitalization, the stigma attached to mental health, and sources of motivation to stay well.  We then worked to identify a specific plan to avoid future hospitalizations when discharged from the hospital for this admission.  The patient expressed little during group, as she was late and then fell asleep.  She was pleasant at all times when awakened, however.    Type of Therapy:  Group Therapy - Process  Participation Level:  Minimal  Participation Quality:  Drowsy  Affect:  Blunted  Cognitive:  Confused  Insight:  Limited  Engagement in Therapy:  Limited  Modes of Intervention:  Exploration, Discussion  Robin MantleMareida Grossman-Orr, LCSW 03/22/2014, 1:23 PM

## 2014-03-22 NOTE — [facility] Group Notes (Signed)
[facility] Group Notes:  (Nursing/MHT/Case Management/Adjunct)  Date:  03/22/2014  Time:  0930  Type of Therapy:  Psychoeducational Skills--healthy coping skills  Participation Level:  Active  Participation Quality:  Drowsy  Affect:  Blunted and Depressed  Cognitive:  Lacking  Insight:  Limited  Engagement in Group:  Lacking and Limited  Modes of Intervention:  Discussion, Education and Exploration  Summary of Progress/Problems:  Malva LimesStrader, Day Deery 03/22/2014, 11:24 AM

## 2014-03-22 NOTE — Progress Notes (Signed)
Patient ID: Robin Montgomery, female   DOB: [birth year], 53 y.o.   MRN: [id number] [facility]  03/22/2014 1:48 PM Robin MeadCynthia V Lo  MRN:  [id number]  Subjective: Patient is seen today and reviewed charts. She states that she likes to use cane instead of [place]itch or wheel chair but she does not have balance at this time. She understand the safety risk and agree to stay with [facility] at this time. She also complaint of having headache and taking tylenol. She has fair mood and constricted affect. She continue to report hearing voices, mostly mumbling and I feel suicidal". She denied homicidal ideations and discussed with patient that she is withdrawing from alcohol and that her current symptoms could be related to her withdrawal. She is compliant with treatment plan and medication without side effects.   Objective: Patient appears to be depressed ,anxious with psychomotor retardation. Patient continues to report voices mumbling to her. Patient is passively suicidal and continues to be on 1;1 2/2 safety as well as due to high fall risk. Patient has poor sleep but fair appetite.   Diagnosis:   DSM5: Primary Psychiatric Diagnosis:  Alcohol use disorder,severe   Secondary Psychiatric Diagnosis:  Alcohol withdrawal with perceptual disturbance (on CIWA protocol)  Cocaine use disorder,severe  Substance (alcohol,cocaine) induced depressive disorder  R/O PTSD   Non Psychiatric Diagnosis:  DM  HTN   Total Time spent with patient: 30 minutes   ADL's:  Impaired  Sleep: Poor  Appetite:  Fair   Psychiatric Specialty Exam: Physical Exam  ROS  Blood pressure 139/80, pulse 90, temperature 97.9 F (36.6 C), temperature source Oral, resp. rate 17, height 5' 1.25" (1.556 m), weight 65.318 kg (144 lb).Body mass index is 26.98 kg/(m^2).  General Appearance: Disheveled  Eye Contact::  Minimal  Speech:  Slow  Volume:  Decreased  Mood:  Anxious, Depressed and Dysphoric  Affect:  Flat  Thought  Process:  Disorganized and Irrelevant  Orientation:  Other:  to person,place ,not to time  Thought Content:  Hallucinations: Auditory Visual  Suicidal Thoughts:  Yes.  without intent/plan  Homicidal Thoughts:  No  Memory:  Immediate;   Fair Recent;   Fair Remote;   Fair  Judgement:  Impaired  Insight:  Lacking  Psychomotor Activity:  Decreased  Concentration:  Fair  Recall:  FiservFair  Fund of Knowledge:Poor  Language: Good  Akathisia:  No  Handed:  Right  AIMS (if indicated):     Assets:  Desire for Improvement  Sleep:  Number of Hours: 6   Musculoskeletal: Strength & Muscle Tone: within normal limits Gait & Station: unsteady Patient leans: N/A  Current Medications: Current Facility-Administered Medications  Medication Dose Route Frequency Provider Last Rate Last Dose  . acetaminophen (TYLENOL) tablet 650 mg  650 mg Oral Q6H PRN Jomarie LongsSaramma Eappen, MD   650 mg at 03/22/14 1202  . alum & mag hydroxide-simeth (MAALOX/MYLANTA) 200-200-20 MG/5ML suspension 30 mL  30 mL Oral Q4H PRN Saramma Eappen, MD      . ARIPiprazole (ABILIFY) tablet 10 mg  10 mg Oral QPM Saramma Eappen, MD   10 mg at 03/21/14 1658  . chlordiazePOXIDE (LIBRIUM) capsule 25 mg  25 mg Oral Q6H PRN Jomarie LongsSaramma Eappen, MD   25 mg at 03/21/14 2115  . chlordiazePOXIDE (LIBRIUM) capsule 25 mg  25 mg Oral BH-qamhs Saramma Eappen, MD   25 mg at 03/22/14 0728   Followed by  . [START ON 03/23/2014] chlordiazePOXIDE (LIBRIUM) capsule 25 mg  25  mg Oral Daily Jomarie LongsSaramma Eappen, MD      . docusate sodium (COLACE) capsule 100 mg  100 mg Oral BID Jomarie LongsSaramma Eappen, MD   100 mg at 03/22/14 0728  . gabapentin (NEURONTIN) capsule 300 mg  300 mg Oral TID Jomarie LongsSaramma Eappen, MD   300 mg at 03/22/14 1202  . hydrOXYzine (ATARAX/VISTARIL) tablet 25 mg  25 mg Oral Q6H PRN Saramma Eappen, MD      . insulin aspart (novoLOG) injection 0-15 Units  0-15 Units Subcutaneous TID WC Jomarie LongsSaramma Eappen, MD   8 Units at 03/22/14 1202  . insulin glargine (LANTUS) injection  10 Units  10 Units Subcutaneous QHS Jomarie LongsSaramma Eappen, MD   10 Units at 03/21/14 2120  . loperamide (IMODIUM) capsule 2-4 mg  2-4 mg Oral PRN Jomarie LongsSaramma Eappen, MD      . magnesium hydroxide (MILK OF MAGNESIA) suspension 30 mL  30 mL Oral Daily PRN Jomarie LongsSaramma Eappen, MD      . multivitamin with minerals tablet 1 tablet  1 tablet Oral Daily Jomarie LongsSaramma Eappen, MD   1 tablet at 03/22/14 0728  . nicotine (NICODERM CQ - dosed in mg/24 hours) patch 21 mg  21 mg Transdermal Daily Jomarie LongsSaramma Eappen, MD   21 mg at 03/22/14 0731  . ondansetron (ZOFRAN-ODT) disintegrating tablet 4 mg  4 mg Oral Q6H PRN Jomarie LongsSaramma Eappen, MD      . sertraline (ZOLOFT) tablet 50 mg  50 mg Oral Daily Jomarie LongsSaramma Eappen, MD   50 mg at 03/22/14 0728  . simvastatin (ZOCOR) tablet 20 mg  20 mg Oral q1800 Jomarie LongsSaramma Eappen, MD   20 mg at 03/21/14 1658  . thiamine (B-1) injection 100 mg  100 mg Intramuscular Once Jomarie LongsSaramma Eappen, MD      . thiamine (VITAMIN B-1) tablet 100 mg  100 mg Oral Daily Jomarie LongsSaramma Eappen, MD   100 mg at 03/22/14 0728  . traZODone (DESYREL) tablet 100 mg  100 mg Oral QHS Jomarie LongsSaramma Eappen, MD   100 mg at 03/21/14 2115    Lab Results:  Results for orders placed during the hospital encounter of 03/19/14 (from the past 48 hour(s))  GLUCOSE, CAPILLARY     Status: None   Collection Time    03/20/14  4:45 PM      Result Value Ref Range   Glucose-Capillary 80  70 - 99 mg/dL  GLUCOSE, CAPILLARY     Status: Abnormal   Collection Time    03/20/14  7:49 PM      Result Value Ref Range   Glucose-Capillary 227 (*) 70 - 99 mg/dL  GLUCOSE, CAPILLARY     Status: Abnormal   Collection Time    03/21/14  5:54 AM      Result Value Ref Range   Glucose-Capillary 270 (*) 70 - 99 mg/dL  GLUCOSE, CAPILLARY     Status: Abnormal   Collection Time    03/21/14 11:53 AM      Result Value Ref Range   Glucose-Capillary 183 (*) 70 - 99 mg/dL   Comment 1 Notify RN    GLUCOSE, CAPILLARY     Status: Abnormal   Collection Time    03/21/14  5:03 PM      Result  Value Ref Range   Glucose-Capillary 423 (*) 70 - 99 mg/dL  GLUCOSE, CAPILLARY     Status: None   Collection Time    03/21/14  8:30 PM      Result Value Ref Range   Glucose-Capillary 75  70 -  99 mg/dL  GLUCOSE, CAPILLARY     Status: Abnormal   Collection Time    03/21/14  9:19 PM      Result Value Ref Range   Glucose-Capillary 165 (*) 70 - 99 mg/dL  GLUCOSE, CAPILLARY     Status: Abnormal   Collection Time    03/22/14  6:23 AM      Result Value Ref Range   Glucose-Capillary 238 (*) 70 - 99 mg/dL  GLUCOSE, CAPILLARY     Status: Abnormal   Collection Time    03/22/14 11:30 AM      Result Value Ref Range   Glucose-Capillary 273 (*) 70 - 99 mg/dL    Physical Findings: AIMS: Facial and Oral Movements Muscles of Facial Expression: None, normal Lips and Perioral Area: None, normal Jaw: None, normal Tongue: None, normal,Extremity Movements Upper (arms, wrists, hands, fingers): None, normal Lower (legs, knees, ankles, toes): None, normal, Trunk Movements Neck, shoulders, hips: None, normal, Overall Severity Severity of abnormal movements (highest score from questions above): None, normal Incapacitation due to abnormal movements: None, normal Patient's awareness of abnormal movements (rate only patient's report): No Awareness, Dental Status Current problems with teeth and/or dentures?: No Does patient usually wear dentures?: No  CIWA:  CIWA-Ar Total: 6 COWS:     Treatment Plan Summary: Daily contact with patient to assess and evaluate symptoms and progress in treatment Medication management  Plan: Patient will benefit from continued inpatient treatment and stabilization.  Reviewed past medical records,treatment plan.  Continue Zoloft 50 mg po daily for depression. Continue Abilify to 10 mg po qhs tonight. Continue Trazodone 100 mg po qhs. Continue Gabapentin 300 mg po tid. Will continue Librium protocol for alcohol detox treatment as planned.  Will continue to monitor  vitals ,medication compliance and treatment side effects  Will monitor for medical issues as well as call consult as needed.  Reviewed labs ,will order as needed.  CSW will start working on disposition. Patient to be referred to a substance abuse program upon once stable. Encourage patient to participate in therapeutic milieu .   Medical Decision Making Problem Points:  Established problem, stable/improving (1) Data Points:  Order Aims Assessment (2) Review of medication regiment & side effects (2)  I certify that inpatient services furnished can reasonably be expected to improve the patient's condition.   Nehemiah Settle MD 03/22/2014, 1:48 PM

## 2014-03-22 NOTE — Progress Notes (Signed)
Nursing 1:1 note D:Pt in room preparing to go to dayroom to get vitals. RR even and unlabored.  A: 1:1 observation continues for safety  R: pt remains safe

## 2014-03-22 NOTE — Progress Notes (Signed)
Patient ID: Natasha MeadCynthia V Prim, female   DOB: Jul 21, 1960, 53 y.o.   MRN: [id number] 1-1 Monitoring Note. D. Pt remains 1.1. Monitoring for safety. She remains confused at times, but is able to be verbally redirected. She reports continued auditory hallucinations. She remains disoriented to to time and place. [name] continues to have very unsteady gait, but has been up walking with [place] and staff, as well as using wheelchair.   A. 1.1. At arms length. Discussed patients reports of auditory hallucinations with Dr. Shela CommonsJ. As patient reports worsening AH. No orders received at this time. R. Patient is safe. Will continue to monitor 1.1. As ordered.

## 2014-03-22 NOTE — Progress Notes (Signed)
Patient ID: Robin Montgomery, female   DOB: [birth year], 53 y.o.   MRN: [id number] 1-1 Monitoring Note. D. Pt remains 1.1. Monitoring for safety. She remains confused at times, but is able to be verbally redirected. She reports '' I feel worse today, I'm hearing more voices, they are getting louder, calling me momma and telling me to die or hurt myself! '' She remains disoriented to to time and place. [name] continues to have very unsteady gait. Pt using wheelchair on the unit with staff assistance. A. 1.1. At arms length. R. Patient is safe. Will continue to monitor 1.1. As ordered.

## 2014-03-22 NOTE — Progress Notes (Signed)
Patient ID: [name], female   DOB: 1960-06-29, 53 y.o.   MRN: [id number] 1-1 Monitoring Note. D. Pt remains 1.1. Monitoring for safety. [name]. [place] continues to have very unsteady gait at times.  Pt using wheelchair on the unit with staff assistance. A. 1.1. At arms length. R. Patient is safe. Will continue to monitor 1.1. As ordered.

## 2014-03-22 NOTE — Progress Notes (Signed)
[facility] Group Notes:  (Nursing/MHT/Case Management/Adjunct)  Date:  03/22/2014  Time:  10:45 PM  Type of Therapy:  Psychoeducational Skills  Participation Level:  Minimal  Participation Quality:  Attentive  Affect:  Blunted  Cognitive:  Appropriate  Insight:  Limited  Engagement in Group:  Limited  Modes of Intervention:  Education  Summary of Progress/Problems: The patient had little to share with in group this evening. The patient stated that she was grateful since she "woke up" today. She also mentioned that she attended groups today. As a theme for the day, her coping skill will be to attend a long-term treatment program.   Westly PamGOODMAN, Nethaniel Mattie S 03/22/2014, 10:45 PM

## 2014-03-22 NOTE — Progress Notes (Signed)
Pt continues to be only oriented to person, but pt is cooperative and has bright and pleasant affect, pt +ve SI/VH- pt contracts for safety. Pt taking medication and pt going to groups.  Nursing 1:1 note D:Pt observed sleeping in bed with eyes closed. RR even and unlabored. No distress noted. A: 1:1 observation continues for safety  R: pt remains safe

## 2014-03-22 NOTE — Progress Notes (Signed)
Nursing 1:1 note D:Pt observed sleeping in bed with eyes closed. RR even and unlabored. No distress noted.Pt had H/A 9/10 and was given tylenol at 147. A: 1:1 observation continues for safety  R: pt remains safe

## 2014-03-22 NOTE — Plan of Care (Signed)
Problem: Alteration in thought process Goal: LTG-Patient has not harmed self or others in at least 2 days Outcome: Completed/Met Date Met:  03/22/14 Pt not harmed self or others in 2 days AEB documentation and assessment of patient. Goal: LTG-Patient verbalizes understanding importance med regimen (Patient verbalizes understanding of importance of medication regimen and need to continue outpatient care.)  Outcome: Progressing Pt statements that she knows she needs to take her medications to get better

## 2014-03-23 LAB — GLUCOSE, CAPILLARY
Glucose-Capillary: 258 mg/dL — ABNORMAL HIGH (ref 70–99)
Glucose-Capillary: 183 mg/dL — ABNORMAL HIGH (ref 70–99)
Glucose-Capillary: 293 mg/dL — ABNORMAL HIGH (ref 70–99)

## 2014-03-23 MED ORDER — LORAZEPAM 1 MG PO TABS
1.0000 mg | ORAL_TABLET | Freq: Once | ORAL | Status: AC
  Administered 2014-03-23: 1 mg via ORAL
  Filled 2014-03-23: qty 1

## 2014-03-23 MED ORDER — ARIPIPRAZOLE 5 MG PO TABS
5.0000 mg | ORAL_TABLET | Freq: Once | ORAL | Status: AC
  Administered 2014-03-23: 5 mg via ORAL

## 2014-03-23 MED ORDER — ARIPIPRAZOLE 5 MG PO TABS
5.0000 mg | ORAL_TABLET | Freq: Every day | ORAL | Status: DC
  Administered 2014-03-24: 5 mg via ORAL
  Filled 2014-03-23 (×3): qty 1
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
## 2014-03-23 NOTE — Progress Notes (Signed)
Patient ID: Robin Montgomery, female   DOB: 02/16/1961, 53 y.o.   MRN: [id number] 1-1 Monitoring Note. D. Pt remains 1.1. Monitoring for safety. . Devinne continues to have very unsteady gait at times.  Pt using wheelchair on the unit with staff assistance. A. 1.1. At arms length. R. Patient is safe. Will continue to monitor 1.1. As ordered.   

## 2014-03-23 NOTE — [facility] Group Notes (Signed)
[facility] Group Notes:  (Clinical Social Work)  03/23/2014   11:15am-12:00pm  Summary of Progress/Problems:  The main focus of today's process group was to listen to a variety of genres of music and to identify that different types of music provoke different responses.  The patient then was able to identify personally what was soothing for them, as well as energizing.  The patient expressed understanding of concepts, as well as knowledge of how each type of music affected him/her and how this can be used at home as a wellness/recovery tool.  At the beginning of group, she identified her overall mood as "pissed off" and anxious, rating her anxiety at 10 out of 10.  She left to get some medication during group, returned for the remainder of group.  She stated her anxiety at the end of group was 1 out of 10.  Type of Therapy:  Music Therapy   Participation Level:  Active  Participation Quality:  Attentive and Drowsy  Affect:  Blunted  Cognitive:  Oriented  Insight:  Engaged  Engagement in Therapy:  Engaged  Modes of Intervention:   Activity, Exploration  Ambrose MantleMareida Grossman-Orr, LCSW 03/23/2014, 12:30pm

## 2014-03-23 NOTE — Progress Notes (Signed)
Nursing 1:1 note D:Pt observed sleeping in bed with eyes closed. RR even and unlabored. No distress noted. A: 1:1 observation continues for safety  R: pt remains safe  

## 2014-03-23 NOTE — Progress Notes (Signed)
RN 1:1 Note 2100 D: Pt at med window to receive her qhs medications. Pt in the wheelchair with her sitter remaining within arms length.. Pt appears to be in no signs of distress at this time.  A: 1:1 observation remains for this pt.  R: Pt remains safe at this time.
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
## 2014-03-23 NOTE — Progress Notes (Signed)
Patient ID: Robin Montgomery, female   DOB: 02/20/1961, 53 y.o.   MRN: [id number] 1-1 Monitoring Note. D. Pt remains 1.1. Monitoring for safety. . [name] continues to have very unsteady gait at times.  Pt using wheelchair on the unit with staff assistance. A. 1.1. At arms length. R. Patient is safe. Will continue to monitor 1.1. As ordered.   

## 2014-03-23 NOTE — [facility] Group Notes (Signed)
[facility] Group Notes:  (Nursing/MHT/Case Management/Adjunct)  Date:  03/23/2014  Time:  0930  Type of Therapy:  Self inventory review  Participation Level:  Active  Participation Quality:  Attentive  Affect:  Appropriate  Cognitive:  Appropriate  Insight:  Good  Engagement in Group:  Developing/Improving  Modes of Intervention:  Discussion  Summary of Progress/Problems: She participated in groups and discussed how she felt.  Oliva BustardShimp, Christapher Gillian Larraine 03/23/2014, 5:51 PM
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
## 2014-03-24 DIAGNOSIS — F19951 Other psychoactive substance use, unspecified with psychoactive substance-induced psychotic disorder with hallucinations: Secondary | ICD-10-CM

## 2014-03-24 DIAGNOSIS — F1420 Cocaine dependence, uncomplicated: Secondary | ICD-10-CM

## 2014-03-24 DIAGNOSIS — E118 Type 2 diabetes mellitus with unspecified complications: Secondary | ICD-10-CM

## 2014-03-24 DIAGNOSIS — F1020 Alcohol dependence, uncomplicated: Secondary | ICD-10-CM

## 2014-03-24 LAB — GLUCOSE, CAPILLARY
Glucose-Capillary: 280 mg/dL — ABNORMAL HIGH (ref 70–99)
Glucose-Capillary: 315 mg/dL — ABNORMAL HIGH (ref 70–99)
Glucose-Capillary: 135 mg/dL — ABNORMAL HIGH (ref 70–99)
Glucose-Capillary: 292 mg/dL — ABNORMAL HIGH (ref 70–99)
Glucose-Capillary: 281 mg/dL — ABNORMAL HIGH (ref 70–99)

## 2014-03-24 MED ORDER — GABAPENTIN 100 MG PO CAPS
200.0000 mg | ORAL_CAPSULE | Freq: Three times a day (TID) | ORAL | Status: DC
  Administered 2014-03-24 – 2014-03-27 (×9): 200 mg via ORAL
  Filled 2014-03-24 (×6): qty 2
  Filled 2014-03-24: qty 84
  Filled 2014-03-24 (×3): qty 2
  Filled 2014-03-24: qty 84
  Filled 2014-03-24 (×2): qty 2
  Filled 2014-03-24: qty 84
  Filled 2014-03-24: qty 2

## 2014-03-24 MED ORDER — ARIPIPRAZOLE 15 MG PO TABS
15.0000 mg | ORAL_TABLET | Freq: Every evening | ORAL | Status: DC
  Administered 2014-03-25: 15 mg via ORAL
  Filled 2014-03-24 (×3): qty 1

## 2014-03-24 MED ORDER — IBUPROFEN 400 MG PO TABS
400.0000 mg | ORAL_TABLET | Freq: Three times a day (TID) | ORAL | Status: DC
  Administered 2014-03-24 – 2014-03-27 (×9): 400 mg via ORAL
  Filled 2014-03-24 (×17): qty 1

## 2014-03-24 MED ORDER — ARIPIPRAZOLE 10 MG PO TABS
10.0000 mg | ORAL_TABLET | Freq: Every evening | ORAL | Status: AC
  Administered 2014-03-24: 10 mg via ORAL
  Filled 2014-03-24: qty 1

## 2014-03-24 MED ORDER — HYDROXYZINE HCL 25 MG PO TABS
25.0000 mg | ORAL_TABLET | Freq: Three times a day (TID) | ORAL | Status: DC
  Administered 2014-03-24 – 2014-03-27 (×10): 25 mg via ORAL
  Filled 2014-03-24 (×7): qty 1
  Filled 2014-03-24 (×2): qty 42
  Filled 2014-03-24: qty 1
  Filled 2014-03-24: qty 42
  Filled 2014-03-24 (×3): qty 1
  Filled 2014-03-24: qty 42
  Filled 2014-03-24 (×2): qty 1

## 2014-03-24 MED ORDER — SERTRALINE HCL 100 MG PO TABS
100.0000 mg | ORAL_TABLET | Freq: Every day | ORAL | Status: DC
  Administered 2014-03-25 – 2014-03-27 (×3): 100 mg via ORAL
  Filled 2014-03-24: qty 14
  Filled 2014-03-24 (×4): qty 1

## 2014-03-24 MED ORDER — ALBUTEROL SULFATE HFA 108 (90 BASE) MCG/ACT IN AERS
2.0000 | INHALATION_SPRAY | RESPIRATORY_TRACT | Status: DC | PRN
  Administered 2014-03-24 – 2014-03-27 (×6): 2 via RESPIRATORY_TRACT

## 2014-03-24 NOTE — Progress Notes (Signed)
RN 1:1 Note 0500 D: Pt in bed resting with eyes closed in right lateral position. Respirations even and unlabored. Pt appears to be in no signs of distress at this time. Sitter at bedside.  A: 1:1 observation for safety remains for this pt.  R: Pt remains safe at this time.

## 2014-03-24 NOTE — Progress Notes (Signed)
Pt currently in the dayroom talking on phone. During morning meds, patient complained of some SOB. Some chest pain and generalized pain expressed. VS obtained and stable.(see doc flowsheet) Pulse ox 100% on RA. MD informed. 1:1 sitter at bed side. Patient remains on 1:1 for fall risk and is safe at this time, using wheelchair for ambulation.

## 2014-03-24 NOTE — Progress Notes (Signed)
D: Patient ambulatory via wheel chair by MHT to medication window. Pt appears sedated.  A: Pt 1:1 maintained for fall risk/safety. Pt remains safe. Medications administered per providers orders (See MAR). Provider notified of pt sedation and order adjustments acknowledged. R: Pt receptive and cooperative to nursing interventions.

## 2014-03-24 NOTE — [facility] Group Notes (Signed)
[facility]   03/24/2014 10:25 AM  Participation Quality:  Appropriate   Mood/Affect:  Appropriate  Depression Rating:  10  Anxiety Rating:  10 (although pt smiling, calm, and joking during group) pt presentation does not correspond to self report.   Thoughts of Suicide:  No Will you contract for safety?   NA  Current AVH:  No  Plan for Discharge/Comments:  Pt reports that she still feels weak today. CSW informed pt that [facility] and [facility] referral was sent last week and is pending. Pt in wheelchair due to "dizziness and weakness." pt stated that she is able to return to extended stay hotel if inpatient treatment is not an option and is open to IOP. CSW assessing.   Transportation Means: family  Supports: father of kids is primary financial and [place] manageremotional support   Smart, OncologistHeather LCSWA

## 2014-03-24 NOTE — Progress Notes (Signed)
D: Pt presents sad in affect and depressed in moods. Pt reports seeing shadows. Pt is currently endorsing SI. Pt reports no plan. Pt is currently denying any HI/AH. Pt was present for group this evening.  A: Writer administered scheduled medications to pt, per MD orders. Continued support and availability as needed was extended to this pt. Staff continue to monitor pt with q7115min checks.  R: No adverse drug reactions noted. Pt receptive to treatment. Pt is verbally contracting for safety. Pt remains safe at this time.

## 2014-03-24 NOTE — [facility] Group Notes (Signed)
[facility] LCSW Group Therapy  03/24/2014 1:53 PM  Type of Therapy:  Group Therapy  Participation Level:  Active  Participation Quality:  Attentive  Affect:  Appropriate  Cognitive:  Alert and Oriented  Insight:  Engaged  Engagement in Therapy:  Improving  Modes of Intervention:  Discussion, Exploration, Orientation, Rapport Building, Socialization and Support  Summary of Progress/Problems: Today's Topic: Overcoming Obstacles. Pt identified obstacles faced currently and processed barriers involved in overcoming these obstacles. Pt identified steps necessary for overcoming these obstacles and explored motivation (internal and external) for facing these difficulties head on. Pt further identified one area of concern in their lives and chose a skill of focus pulled from their "toolbox." [name] was attentive and engaged during today's therapy group. She was able to identify an obstacle that she overcame "I successfully raised my kids." Pt discussed how proud she is of her middle child who is about to graduate and talked about how she "just knew what I had to do and did it." [name] shows progress in the group setting and improving insight AEB her ability to identify a new obstacle "beating addiction" and problem solve around how to overcome this obstacle. "I want to get into treatment, go to meetings, and do anything I can to take a sideroad rather than take the same old road I've always been on."    Smart, Lebron QuamHeather LCSWA 03/24/2014, 1:53 PM

## 2014-03-24 NOTE — Progress Notes (Signed)
D: Patients mood is pleasant. Pt's affect is appropriate for situation. Pt is alert and oriented x2. Pt not oriented to date and current president. Pt reports SI but denies having a plan. Pt complains of shortness of breath and chest pain, 8/10. Pt request evaluation of her feet; she reports pedal swelling after a fall several years ago. Pt reports that she slept well. Pt reports her goal for the day is to "work on my depression" by "think[ing] positive thoughts and go[ing] to group." Pt reports her depression is 9/10. Pt reports feelings of hopelessness 9/10 and anxiety 7/10. Pt is attending groups. A: Vitals taken (See doc flowsheet). MD made aware of pt's reported pain/SOB. Medications administered per provider orders (See MAR). Feet assessed by RN; pedal pulses 3+ bilaterally, no swelling, no pitting, no obvious injury noted. Pt encouraged to voice concerns about feet with provider at next interaction. R: Pt remains on 1:1 with MHT. Pt receptive and cooperative to nursing interventions. Pt verbally contracts for safety and reports feeling safe in the hospital.

## 2014-03-24 NOTE — Progress Notes (Signed)
D: Patient observed sleeping in room. Pt's respirations are even and unlabored. A: Pt 1:1 maintained for fall risk/safety. MHT at bedside. R: Pt remains safe at this time.

## 2014-03-24 NOTE — Plan of Care (Signed)
Problem: Alteration in thought process Goal: LTG-Patient behavior demonstrates decreased signs psychosis Pt positive for AH "telling me to hurt myself" and VH " I see shadows." By d/c, pt will report no AVH. Goal not met. [place], [place] 03/20/2014 9:50 AM  Outcome: Progressing Pt denies any AH. Pt does continue to have VH as she reports seeing shadows.

## 2014-03-24 NOTE — Progress Notes (Signed)
D: Pt in bed resting with eyes closed in right lateral position. Respirations even and unlabored. Pt appears to be in no signs of distress at this time. Sitter at bedside. A: 1:1 observation for safety remains for this pt. R: Pt remains safe at this time.
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
## 2014-03-24 NOTE — Progress Notes (Signed)
Nursing 1:1 note D:Pt observed sitting in dayroom RR even and unlabored. No distress noted. A: 1:1 observation continues for safety  R: pt remains safe  

## 2014-03-25 LAB — FOLATE: Folate: 14.8 ng/mL

## 2014-03-25 LAB — RPR

## 2014-03-25 LAB — GLUCOSE, CAPILLARY: Glucose-Capillary: 258 mg/dL — ABNORMAL HIGH (ref 70–99)

## 2014-03-25 LAB — VITAMIN B12: Vitamin B-12: 435 pg/mL (ref 211–911)

## 2014-03-25 LAB — HIV ANTIBODY (ROUTINE TESTING W REFLEX): HIV 1&2 Ab, 4th Generation: NONREACTIVE

## 2014-03-25 MED ORDER — CYANOCOBALAMIN 500 MCG PO TABS
500.0000 ug | ORAL_TABLET | Freq: Every day | ORAL | Status: DC
  Administered 2014-03-25 – 2014-03-27 (×3): 500 ug via ORAL
  Filled 2014-03-25 (×5): qty 1
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
## 2014-03-25 NOTE — [facility] Group Notes (Signed)
[facility] LCSW Group Therapy  03/25/2014 11:24 AM  Type of Therapy:  Group Therapy  Participation Level:  Active  Participation Quality:  Attentive  Affect:  Depressed  Cognitive:  Alert  Insight:  Improving  Engagement in Therapy:  Improving  Modes of Intervention:  Discussion, Education, Exploration, Limit-setting, Problem-solving, Socialization and Support  Summary of Progress/Problems:  Feelings around Diagnosis--patients were asked to talk about what diagnosis means to them, process if and why it is important to know their mental health diagnosis, and discuss pros and cons of having a mental health diagnosis. Robin Montgomery was attentive and engaged during today's processing group. She shared that knowing her diagnosis is important to her because it is necessary for knowing how to treat and get well. Robin Montgomery shows some progress in the group setting and improving insight AEB her ability to process how knowing one's diagnosis is important for treatment and recovery.  Robin Montgomery, Robin Montgomery LCSWA 03/25/2014, 11:24 AM

## 2014-03-25 NOTE — [facility] Group Notes (Signed)
The focus of this group is to educate the patient on the purpose and policies of crisis stabilization and provide a format to answer questions about their admission.  The group details unit policies and expectations of patients while admitted. Patient attended this group and was cooperative. 

## 2014-03-25 NOTE — Plan of Care (Signed)
Problem: Ineffective individual coping Goal: LTG: Patient will report a decrease in negative feelings Outcome: Progressing Pt stated to nurse she felt better today, and did not feel as down today

## 2014-03-25 NOTE — Progress Notes (Signed)
Nursing 1:1 note D:Pt observed sleeping in bed with eyes closed. RR even and unlabored. No distress noted. A: 1:1 observation continues for safety  R: pt remains safe  

## 2014-03-25 NOTE — Progress Notes (Addendum)
0800  Patient sitting on bed with 1:1 present, talking to MD.  Patient denied SI and HI.  Stated she does hear mumblings and seeing shadows.  [place] and wheelchair in her room.  Plans to go to substance abuse program.  Will probably be discharged in a few days.  Ate 100% breakfast.  Stated her mood is good.  Rated depression 7.  Anxiety up and down.  Anything could make her anxious.  Plan today is to go to groups.  Goal is to think about something good.  Respirations even and unlabored.  No signs/symptoms of pain/distress noted on patient's face/body movements.  1:1 continues for safety.  Safety maintained.  0900  Patient in wheelchair at medication window with 1:1 present.  Patient smiling and talking to staff.  Patient denied SI and HI.  Continues to hear mumblings and sees shadows.  Stated she is feeling better today.  Respirations even and unlabored.  No signs/symptoms of pain/distress noted on patient's face/body movements.  Safety maintained with 1:1 present per MD order.
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
## 2014-03-25 NOTE — Progress Notes (Signed)
D) Pt. Is noted resting at this time.  Pt. Has worked toward increasing her ambulation and is reporting feeling tired.  Pt. Continues compliant with medication regime and is cooperative with allowing staff to obtain CBG's and administer insulin.  Pt. Using [place] to attend dinner, but continues on 1:1 with staff for safety due to high fall risk.  Noted interacting with staff and peers appropriately. Underlying mood continues to appear depressed.  A) Pt. Offered encouragement and validation for ambulatory efforts.  Medications reviewed and began to address questions about insulin use.  R) Pt. Receptive and continues on 1:1 for safety.  Pt. Safe at this time.

## 2014-03-25 NOTE — Progress Notes (Signed)
D) Pt. Engaged in milieu and making attempts to ambulate in halls with support of wheelchair to "try to get into [facility]".  Pt. Denies thoughts os SI/HI and reports positive auditory hallucinations "whispers" and reports "seeing shadows" .  Pt. Reports chronic pain in feet and left wrist area.  Pt. Denied desire for additional comfort measures.  A)Support offered.  R) Pt. Remains safe and continues on 1:1 at this time.

## 2014-03-26 LAB — GLUCOSE, CAPILLARY
Glucose-Capillary: 175 mg/dL — ABNORMAL HIGH (ref 70–99)
Glucose-Capillary: 182 mg/dL — ABNORMAL HIGH (ref 70–99)
Glucose-Capillary: 190 mg/dL — ABNORMAL HIGH (ref 70–99)

## 2014-03-26 MED ORDER — GUAIFENESIN 100 MG/5ML PO SYRP
200.0000 mg | ORAL_SOLUTION | Freq: Three times a day (TID) | ORAL | Status: DC | PRN
  Administered 2014-03-26: 200 mg via ORAL
  Filled 2014-03-26: qty 10

## 2014-03-26 MED ORDER — MENTHOL 3 MG MT LOZG
1.0000 | LOZENGE | OROMUCOSAL | Status: DC | PRN
  Administered 2014-03-26: 3 mg via ORAL

## 2014-03-26 MED ORDER — ARIPIPRAZOLE 10 MG PO TABS
20.0000 mg | ORAL_TABLET | Freq: Every evening | ORAL | Status: DC
  Administered 2014-03-26: 20 mg via ORAL
  Filled 2014-03-26: qty 2
  Filled 2014-03-26: qty 28
  Filled 2014-03-26: qty 2

## 2014-03-26 NOTE — Progress Notes (Signed)
D: Pt was pleasant and cooperative at the time of the assessment.  Pt voiced no questions or concerns. Writer spoke to pt about administering her own insulin inj.   A: continue 1:1 for pt safety. Support and encouragement was offered.   R: Pt remains safe.

## 2014-03-26 NOTE — [facility] Group Notes (Signed)
[facility] LCSW Group Therapy  03/26/2014 1:30 PM  Type of Therapy:  Group Therapy  Participation Level:  Minimal   Participation Quality:  Drowsy  Affect:  Flat  Cognitive:  Appropriate  Insight:  Limited  Engagement in Therapy:  Limited  Modes of Intervention:  Discussion, Education, Socialization and Support  Summary of Progress/Problems:Mental [employer] Association (MHA) speaker came to talk about his personal journey with substance abuse and mental illness. Group members were challenged to process ways by which to relate to the speaker. MHA speaker provided handouts and educational information pertaining to groups and services offered by the [facility]. [name] helped provide directions to the Mental Health Association to group members.     Montgomery,Robin 03/26/2014, 1:30 PM

## 2014-03-26 NOTE — Progress Notes (Signed)
Patient ID: [name], female   DOB: 05-25-1961, 53 y.o.   MRN: [id number]   D:Pt laying in bed resting with eyes closed. Respirations even and unlabored. No distress noted. A: Monitor 1:1 for pt safety. R: Pt remains safe.

## 2014-03-26 NOTE — Progress Notes (Signed)
Patient ID: Robin MeadCynthia V Kemmerer, female   DOB: 1961-03-01, 53 y.o.   MRN: [id number]  D: Pt informed the writer that she still has A/H, however voices are now a "mumble". Pt informed the writer that she "does not wanna go home". Pt states she'd rather go to a treatment facility instead.   A:  Support and encouragement was offered. 15 min checks continued for safety.  R: Pt remains safe.

## 2014-03-26 NOTE — Progress Notes (Signed)
Patient ID: [name], female   DOB: June 11, 1960, 53 y.o.   MRN: [id number]  1:1 Nursing Note-  Patient is seen in the dining hall eating dinner speaking with other patients. Patient appears happy and having a nice dinner amongst peers. Respirations are even and unlabored. 1:1 is continued and Q15 minute safety checks are continued as well.

## 2014-03-26 NOTE — Progress Notes (Signed)
Patient ID: Robin Montgomery, female   DOB: 27-Mar-1961, 53 y.o.   MRN: [id number]  1:1 Nursing Note-  Patient is currently sitting in the dayroom with MHT by side. Patient reports that she is doing okay. Patient's respirations are even and unlabored with no signs of distress at this time. 1:1 observation is continued for safety. Q15 minute safety checks maintained until discharge.

## 2014-03-26 NOTE — Progress Notes (Signed)
Patient ID: Natasha MeadCynthia V Price, female   DOB: 1961/03/14, 53 y.o.   MRN: [id number]  D: Pt. Denies HI and Visual Hallucinations. Patient reports passive SI and auditory hallucinations that are down to a low mumble. Patient contracts for safety while at the hospital.  Patient does not report any pain or discomfort at this time. Patient rates her depression and hopelessness at 8/10 and her anxiety at 9/10 for the day. Patient reports that her goal is being sober and staying clean. Patient reports she will meet her goal by, "leave the negative people, staying home with my kids, and avoid going out with the wrong people."   A: Support and encouragement provided to the patient to come to writer with any questions or concerns. Scheduled medications administered to patient per physician's orders.  R: Patient is receptive and cooperative with Clinical research associatewriter. Patient is seen in the milieu and is attending groups. 1:1 observation is continued for safety. Q15 minute checks are maintained for safety.

## 2014-03-26 NOTE — Progress Notes (Signed)
Patient ID: [name], female   DOB: 03/14/1961, 53 y.o.   MRN: [id number]  Patient observed laying in bed at this time sleeping. Patient's respirations are even and unlabored with no signs or symptoms of distress. MHT is within reach and 1:1 is continued for safety. Q15 minute safety checks are maintained as well.

## 2014-03-26 NOTE — Progress Notes (Signed)
Patient ID: Robin Montgomery, female   DOB: [birth year], 53 y.o.   MRN: [id number]  D: After speaking to the pt, the pt stated, "they sending me home tomorrow." Writer acknowledged and asked pt how she felt about the decision. Pt stated, "I'm scared, I got 2 checks waiting on me and 2 drug dealers on each side. Stated that [facility] doesn't have any beds available and it may take 2 to 3 days before she can be accepted.  Writer asked pt if it were possible for her to get someone to hold her money until [facility]. Pt stated, "they don't know about one of them, because it comes from my sugar daddy". Writer also spoke to pt about administering her own insulin. Pt acknowledged a need to learn to inject herself. Pt willingly injected her own insulin.  A:  Support and encouragement was offered. 15 min checks continued for safety.  R: Pt remains safe.

## 2014-03-26 NOTE — Clinical Social Work Note (Signed)
Pt provided with AA[place] for [place], Mental Health Association information, Social Security Office info/information about how [place]; and Housing Authority/Affordable housing information (per her request).  [employer]-WilliamsHeather Smart, LCSWA 03/26/2014 11:33 AM

## 2014-03-26 NOTE — Evaluation (Signed)
Occupational Therapy Evaluation Patient Details Name: Robin Montgomery MRN: [id number] DOB: 1960/10/13 Today's Date: 03/26/2014    History of Present Illness 53 yo with depression,substance induced mood disorder,alcohol,cocaine use disorder    Clinical Impression   Pt was admitted for the above.  Pt has painful L knee and is min guard ambulating to gather ADL supplies as well as to bathroom.  Pt reports difficulty lifting and carrying items during the course of her day and has decreased UE strength.  She has had bil cock up splints in the past and would like to use these again for increased support during the day.  Will follow in this setting.  Goals are overall supervision level.    Follow Up Recommendations   (pt plans substance abuse program)    Equipment Recommendations   (shower seat)    Recommendations for Other Services       Precautions / Restrictions Precautions Precautions: Fall Restrictions Weight Bearing Restrictions: No      Mobility Bed Mobility Overal bed mobility: Modified Independent                Transfers Overall transfer level: Needs assistance Equipment used: Straight cane Transfers: Sit to/from Stand Sit to Stand: Min guard         General transfer comment: cues for safety in environment    Balance                                            ADL Overall ADL's : Needs assistance/impaired                                       General ADL Comments: Pt reports difficulty carrying items: she used a paper bag to transport ADL items into bathroom.  She states that she used braces (wrist cock up splints) on bil wrists in the past and that helped her.  She has general weakness in UEs and wrist extension is 3/5 bilaterally.  Pt needs min guard ambulating to bathroom for transfers and adls.  UB adls are set up level, seated vs. min guard to retrieve items.  She needs min guard for LB due to balance.  Pt c/o bil  hands locking up:  she has nodules on radius bones bil.    Spoke to RN and MD about wrist cock up splints:  skin will need to be checked.     Vision                     Perception     Praxis      Pertinent Vitals/Pain Pain Assessment: 0-10 Pain Score: 2  (a little; pt flexed and extended knee and it helped)     Hand Dominance     Extremity/Trunk Assessment Upper Extremity Assessment Upper Extremity Assessment: Generalized weakness;LUE deficits/detail;RUE deficits/detail RUE Deficits / Details: strength grossly 3+/5 to 4-/5; wrist 3/5 extension LUE Deficits / Details: strength grossly 3+/5, wrist extension 3/5           Communication Communication Communication: No difficulties   Cognition Arousal/Alertness: Awake/alert Behavior During Therapy: WFL for tasks assessed/performed Overall Cognitive Status: Within Functional Limits for tasks assessed  General Comments       Exercises       Shoulder Instructions      Home Living Family/patient expects to be discharged to:: Other (Comment) (substance abuse program)                                        Prior Functioning/Environment Level of Independence: Needs assistance             OT Diagnosis: Generalized weakness   OT Problem List: Decreased strength;Impaired UE functional use;Impaired balance (sitting and/or standing)   OT Treatment/Interventions: Self-care/ADL training;Therapeutic exercise;Therapeutic activities;Balance training;Patient/family education    OT Goals(Current goals can be found in the care plan section) Acute Rehab OT Goals Patient Stated Goal: Go to rehab and get better OT Goal Formulation: With patient Time For Goal Achievement: 04/02/14 Potential to Achieve Goals: Good ADL Goals Pt Will Transfer to Toilet: with supervision;ambulating;regular height toilet Pt Will Perform Tub/Shower Transfer: Shower transfer;ambulating;with  supervision Additional ADL Goal #1: pt will tolerate bil wrist cock up splints x daytime use without skin compromise (ortho tech to provide) Additional ADL Goal #2: pt will be independent with bil UE HEP  OT Frequency: Min 2X/week   Barriers to D/C:            Co-evaluation              End of Session    Activity Tolerance: Patient tolerated treatment well Patient left: in bed;with call bell/phone within reach;with nursing/sitter in room   Time: 1254-1309 OT Time Calculation (min): 15 min Charges:  OT General Charges $OT Visit: 1 Procedure OT Evaluation $Initial OT Evaluation Tier I: 1 Procedure OT Treatments $Self Care/Home Management : 8-22 mins G-Codes: OT G-codes **NOT FOR INPATIENT CLASS** Functional Assessment Tool Used: clinical judgment and observation Functional Limitation: Self care Self Care Current Status ([id number]): At least 1 percent but less than 20 percent impaired, limited or restricted Self Care Goal Status ([id number]): At least 1 percent but less than 20 percent impaired, limited or restricted  Robin Montgomery 03/26/2014, 2:56 PM  Robin Montgomery, OTR/L [phone] 03/26/2014

## 2014-03-26 NOTE — [facility] Group Notes (Signed)
Adult Psychoeducational Group Note  Date:  03/26/2014 Time:  9:41 PM  Group Topic/Focus:  Wrap-Up Group:   The focus of this group is to help patients review their daily goal of treatment and discuss progress on daily workbooks.  Participation Level:  Minimal  Participation Quality:  Attentive  Affect:  Depressed and Flat  Cognitive:  Lacking  Insight: Lacking  Engagement in Group:  Limited  Modes of Intervention:  Discussion  Additional Comments:  [name] had no positive goals set for the day.  She stated her goal was to hurt herself and that she is discharging tomorrow.  She expressed having no coping skills.  Caroll RancherLindsay, [name] A 03/26/2014, 9:41 PM
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
## 2014-03-26 NOTE — Progress Notes (Signed)
Physical Therapy Treatment Patient Details Name: Robin Montgomery MRN: [id number] DOB: [birth year] Today's Date: 03/26/2014    History of Present Illness 53 yo with depression,substance induced mood disorder,alcohol,cocaine use disorder     PT Comments    Marked improvement in pt activity tolerance and ambulatory balance with continued mild instability largely self-corrected by pt using SPC.  Discussed with pt need wear shoes to protect feet  - pt states she is aware.  Follow Up Recommendations  Home health PT     Equipment Recommendations  Cane    Recommendations for Other Services       Precautions / Restrictions Precautions Precautions: Fall Restrictions Weight Bearing Restrictions: No    Mobility  Bed Mobility Overal bed mobility: Modified Independent                Transfers Overall transfer level: Needs assistance Equipment used: Straight cane Transfers: Sit to/from Stand Sit to Stand: Min guard;Supervision         General transfer comment: cues for transition position and saftey awareness  Ambulation/Gait Ambulation/Gait assistance: Min guard;Supervision Ambulation Distance (Feet): 200 Feet Assistive device: Straight cane Gait Pattern/deviations: Step-through pattern;Shuffle;Antalgic Gait velocity: mod pace   General Gait Details: Cane readjusted and placed in opposite hand with cues for sequence and cane placement.  Mild instability noted but self corrected by pt.    Stairs            Wheelchair Mobility    Modified Rankin (Stroke Patients Only)       Balance                                    Cognition Arousal/Alertness: Awake/alert Behavior During Therapy: WFL for tasks assessed/performed Overall Cognitive Status: Within Functional Limits for tasks assessed                      Exercises      General Comments        Pertinent Vitals/Pain      Home Living                      Prior  Function            PT Goals (current goals can now be found in the care plan section) Acute Rehab PT Goals Patient Stated Goal: Go to rehab and get better PT Goal Formulation: With patient Time For Goal Achievement: 04/04/14 Potential to Achieve Goals: Good Progress towards PT goals: Progressing toward goals    Frequency  Min 2X/week    PT Plan Current plan remains appropriate    Co-evaluation             End of Session   Activity Tolerance: Patient tolerated treatment well Patient left: in bed;with call bell/phone within reach;with nursing/sitter in room     Time: 1130-1145 PT Time Calculation (min): 15 min  Charges:  $Gait Training: 8-22 mins                    G Codes:      Robin Montgomery 03/26/2014, 1:36 PM

## 2014-03-26 NOTE — Plan of Care (Signed)
Problem: Alteration in mood Goal: STG-Patient reports thoughts of self-harm to staff Outcome: Completed/Met Date Met:  03/26/14 Patient has told staff that she is having thoughts to harm herself during daily psych assessment today. Patient does contract for safety.     

## 2014-03-27 LAB — GLUCOSE, CAPILLARY
Glucose-Capillary: 216 mg/dL — ABNORMAL HIGH (ref 70–99)
Glucose-Capillary: 144 mg/dL — ABNORMAL HIGH (ref 70–99)

## 2014-03-27 MED ORDER — HYDROXYZINE HCL 25 MG PO TABS: 25.0000 mg | ORAL_TABLET | Freq: Three times a day (TID) | ORAL | Status: DC

## 2014-03-27 MED ORDER — SERTRALINE HCL 100 MG PO TABS: 100.0000 mg | ORAL_TABLET | Freq: Every day | ORAL | Status: DC

## 2014-03-27 MED ORDER — GLIMEPIRIDE 4 MG PO TABS
4.0000 mg | ORAL_TABLET | Freq: Every day | ORAL | Status: DC
  Filled 2014-03-27: qty 1
  Filled 2014-03-27: qty 14

## 2014-03-27 MED ORDER — LISINOPRIL 10 MG PO TABS: 10.0000 mg | ORAL_TABLET | Freq: Every day | ORAL | Status: DC

## 2014-03-27 MED ORDER — INSULIN GLARGINE 100 UNIT/ML ~~LOC~~ SOLN: 10.0000 [IU] | Freq: Every day | SUBCUTANEOUS | Status: DC

## 2014-03-27 MED ORDER — LISINOPRIL 10 MG PO TABS
10.0000 mg | ORAL_TABLET | Freq: Every day | ORAL | Status: DC
  Administered 2014-03-27: 10 mg via ORAL
  Filled 2014-03-27: qty 1
  Filled 2014-03-27: qty 14
  Filled 2014-03-27: qty 1
  Filled 2014-03-27: qty 2

## 2014-03-27 MED ORDER — TRAZODONE HCL 100 MG PO TABS: 100.0000 mg | ORAL_TABLET | Freq: Every day | ORAL | Status: DC

## 2014-03-27 MED ORDER — GLIMEPIRIDE 4 MG PO TABS: 4.0000 mg | ORAL_TABLET | Freq: Every day | ORAL | Status: DC

## 2014-03-27 MED ORDER — SIMVASTATIN 20 MG PO TABS: 20.0000 mg | ORAL_TABLET | Freq: Every day | ORAL | Status: DC

## 2014-03-27 MED ORDER — NICOTINE 21 MG/24HR TD PT24: 21.0000 mg | MEDICATED_PATCH | Freq: Every day | TRANSDERMAL | Status: DC

## 2014-03-27 MED ORDER — ARIPIPRAZOLE 20 MG PO TABS: 20.0000 mg | ORAL_TABLET | Freq: Every evening | ORAL | Status: DC

## 2014-03-27 MED ORDER — GABAPENTIN 100 MG PO CAPS: 200.0000 mg | ORAL_CAPSULE | Freq: Three times a day (TID) | ORAL | Status: DC

## 2014-03-27 NOTE — Progress Notes (Signed)
Patient ID: Natasha MeadCynthia V Houchins, female   DOB: 1960-09-23, 53 y.o.   MRN: [id number] Discharge Note-Discharged per Dr.and feels ready to go home. States her plan to stay sober is not to go outside, not to answer her door and to follow up with her discharge plans. She is in good spirits, verbal and pleasant. Reviewed her discharge plans with her and she verbalized her understanding. She has multiple Rx going home with her. She has a follow up appointment.Walked out to her friends car.She denies any thoughts to hurt self. Or others.All property returned to her.

## 2014-03-27 NOTE — Progress Notes (Signed)
Patient ID: Robin Montgomery, female   DOB: [birth year], 53 y.o.   MRN: [id number] Continues on a 1;1 for her being a high fall risk. She is walking with a cane, she has been more ambulatory this am, states she is doing well. Tripped slightly over a transition in the floor wearing her scuff slippers. Got her tennis shoes out of her locker and removed the shoe strings to improve her stability. Took her noon time insulin, coverage of 2 units for a CBG=144, gave her insulin herself without difficulty. Is pleasant, full affect and is verbal.She is looking forward to discharge but is worried about the number of people in her parking lot of the hotel she resides in that use drugs and she doesn't want to return to that. Is being discharged today, her ride should be here at 1500.

## 2014-03-27 NOTE — Discharge Summary (Signed)
Physician Discharge Summary Note  Patient:  Robin Montgomery is an 52 y.o., female MRN:  [id number] DOB:  1960-12-09 Patient phone:  [phone] (home)  Patient address:   [address] [place] [address],  Total Time spent with patient: 45 minutes  Date of Admission:  03/19/2014 Date of Discharge: 03/27/2014  Reason for Admission:  Depression  Discharge Diagnoses: Primary Psychiatric Diagnosis:  Alcohol use disorder,severe   Secondary Psychiatric Diagnosis:  Alcohol withdrawal with perceptual disturbance (resolving)  Cocaine use disorder,severe  Substance (alcohol,cocaine) induced depressive disorder    on Psychiatric Diagnosis:  DM  HTN             Active Problems:   Depression   Psychiatric Specialty Exam: Physical Exam  Vitals reviewed. Psychiatric: She has a normal mood and affect. Her speech is normal and behavior is normal. Judgment and thought content normal. Cognition and memory are normal.    Review of Systems  Constitutional: Negative.   HENT: Negative.   Eyes: Negative.   Respiratory: Negative.   Cardiovascular: Negative.   Gastrointestinal: Negative.   Genitourinary: Negative.   Musculoskeletal: Negative.   Skin: Negative.   Neurological: Negative.   Endo/Heme/Allergies: Negative.   Psychiatric/Behavioral: Positive for depression (Hx of, chronic, stabilized). Negative for suicidal ideas, hallucinations, memory loss and substance abuse. The patient is nervous/anxious (Hx of, chronic, stabilized). The patient does not have insomnia.     Blood pressure 137/79, pulse 84, temperature 97.4 F (36.3 C), temperature source Oral, resp. rate 16, height 5' 1.25" (1.556 m), weight 65.318 kg (144 lb), SpO2 98.00%.Body mass index is 26.98 kg/(m^2).   Past Psychiatric History:  Diagnosis:depression,substance induced mood disorder,alcohol,cocaine use disorder   Hospitalizations:[facility] at least 2   Outpatient Care:unknown   Substance Abuse  Care:yes ,several detox,rehab   Self-Mutilation:denies   Suicidal Attempts:yes -OD on pills ,walked in traffic,cut self   Violent Behaviors:    Musculoskeletal: Strength & Muscle Tone: within normal limits Gait & Station: normal Patient leans: N/A   Past Medical History  Diagnosis Date  . Diabetes mellitus without complication   . Hypertension   . Depression    Level of Care:  OP  Hospital Course:   Robin Montgomery is a 53 year old AAF who lived with 2 teenage children in a local hotel for the past 9 years She presented to the ED with complaints of worsening depression as well as SI with a plan to walk to front of traffic.  Additionally, she also endorsed AVH.  Patient has a significant past history of alcohol abuse as well as cocaine use disorder. She also has a history of depression and was on medications in the past. Patient reports being at several rehab and detox facilities in the past ,but she would always relapse.   In order to re-stabilize Robin Montgomery's moods, it was determined that she needed to have medications management.  After she was given ARIPiprazole (ABILIFY) 20 MG tablet to help with her psychosis,  sertraline (ZOLOFT) 100 MG tablet for her depression and with her insomina traZODone (DESYREL) 100 MG tablet was given.  She was compliant with meds and she did not report any adverse side effects.  She stated that she felt better and the AVH decreased.    Robin Montgomery had some difficulties in ambulating.  PT consult evaluated her to use a cane for safety and support.  She was observed to make an effort with walking around the unit.  Additionally, patient has medical history of diabetes.  She was  unable to verify who was managing her DM.  States that she only had been PO hypoglycemics.  Her blood glucose daily monitoring showed her levels were 150-170->200.  Lantus 10 units daily was scheduled in addition to insulin per sliding scale.   Efforts were made to instruct patient on self  administration of insulin subcutaneously.  Her HgB A1C 9.7  An appointment was made at [facility] [place] for further evaluation and education of diabetes tomorrow 03/28/2014 at 9 am.   Patient per CSW, she had been attending groups.  She was encouraged to participate in therapeutic milieu during her stay.  It would be beneficial if patient was referred to substance abuse program after discharge as she is motivated to do so.  She has follow up [facility] and at [facility].  Prescriptions given and sample supply provided [facility].  Consults:  psychiatry  Significant Diagnostic Studies:  labs: Per ED  Discharge Vitals:   Blood pressure 137/79, pulse 84, temperature 97.4 F (36.3 C), temperature source Oral, resp. rate 16, height 5' 1.25" (1.556 m), weight 65.318 kg (144 lb), SpO2 98.00%. Body mass index is 26.98 kg/(m^2). Lab Results:   Results for orders placed during the hospital encounter of 03/19/14 (from the past 72 hour(s))  GLUCOSE, CAPILLARY     Status: Abnormal   Collection Time    03/24/14  5:05 PM      Result Value Ref Range   Glucose-Capillary 292 (*) 70 - 99 mg/dL   Comment 1 Notify RN    RPR     Status: None   Collection Time    03/24/14  7:04 PM      Result Value Ref Range   RPR NON REAC  NON REAC   Comment: Performed at [facility]  VITAMIN B12     Status: None   Collection Time    03/24/14  7:04 PM      Result Value Ref Range   Vitamin B-12 435  211 - 911 pg/mL   Comment: Performed at [facility]  FOLATE     Status: None   Collection Time    03/24/14  7:04 PM      Result Value Ref Range   Folate 14.8     Comment: (NOTE)     Reference Ranges            Deficient:       0.4 - 3.3 ng/mL            Indeterminate:   3.4 - 5.4 ng/mL            Normal:              > 5.4 ng/mL     Performed at [facility]  HIV ANTIBODY (ROUTINE TESTING)     Status: None   Collection Time    03/24/14  7:04 PM       Result Value Ref Range   HIV 1&2 Ab, 4th Generation NONREACTIVE  NONREACTIVE   Comment: (NOTE)     A NONREACTIVE HIV Ag/Ab result does not exclude HIV infection since     the time frame for seroconversion is variable. If acute HIV infection     is suspected, a HIV-1 RNA Qualitative TMA test is recommended.     HIV-1/2 Antibody Diff         Not indicated.     HIV-1 RNA, Qual TMA  Not indicated.     PLEASE NOTE: This information has been disclosed to you from records     whose confidentiality may be protected by state law. If your state     requires such protection, then the state law prohibits you from making     any further disclosure of the information without the specific written     consent of the person to whom it pertains, or as otherwise permitted     by law. A general authorization for the release of medical or other     information is NOT sufficient for this purpose.     The performance of this assay has not been clinically validated in     patients less than 56 years old.     Performed at [facility]  GLUCOSE, CAPILLARY     Status: Abnormal   Collection Time    03/24/14  9:18 PM      Result Value Ref Range   Glucose-Capillary 281 (*) 70 - 99 mg/dL  GLUCOSE, CAPILLARY     Status: Abnormal   Collection Time    03/25/14  6:33 AM      Result Value Ref Range   Glucose-Capillary 258 (*) 70 - 99 mg/dL   Comment 1 Notify RN    GLUCOSE, CAPILLARY     Status: Abnormal   Collection Time    03/26/14  6:24 AM      Result Value Ref Range   Glucose-Capillary 175 (*) 70 - 99 mg/dL  GLUCOSE, CAPILLARY     Status: Abnormal   Collection Time    03/26/14 11:48 AM      Result Value Ref Range   Glucose-Capillary 182 (*) 70 - 99 mg/dL  GLUCOSE, CAPILLARY     Status: Abnormal   Collection Time    03/26/14  8:28 PM      Result Value Ref Range   Glucose-Capillary 190 (*) 70 - 99 mg/dL  GLUCOSE, CAPILLARY     Status: Abnormal   Collection Time    03/27/14  5:25 AM       Result Value Ref Range   Glucose-Capillary 216 (*) 70 - 99 mg/dL  GLUCOSE, CAPILLARY     Status: Abnormal   Collection Time    03/27/14 11:21 AM      Result Value Ref Range   Glucose-Capillary 144 (*) 70 - 99 mg/dL   Comment 1 Documented in Chart     Comment 2 Notify RN      Physical Findings: AIMS: Facial and Oral Movements Muscles of Facial Expression: None, normal Lips and Perioral Area: None, normal Jaw: None, normal Tongue: None, normal,Extremity Movements Upper (arms, wrists, hands, fingers): None, normal Lower (legs, knees, ankles, toes): None, normal, Trunk Movements Neck, shoulders, hips: None, normal, Overall Severity Severity of abnormal movements (highest score from questions above): None, normal Incapacitation due to abnormal movements: None, normal Patient's awareness of abnormal movements (rate only patient's report): No Awareness, Dental Status Current problems with teeth and/or dentures?: No Does patient usually wear dentures?: No  CIWA:  CIWA-Ar Total: 3 COWS:     Psychiatric Specialty Exam: See Psychiatric Specialty Exam and Suicide Risk Assessment completed by Attending Physician prior to discharge.  Discharge destination:  Home  Is patient on multiple antipsychotic therapies at discharge:  No   Has Patient had three or more failed trials of antipsychotic monotherapy by history:  No  Recommended Plan for Multiple Antipsychotic Therapies: NA     Medication List  STOP taking these medications       ibuprofen 200 MG tablet  Commonly known as:  ADVIL,MOTRIN     metFORMIN 500 MG tablet  Commonly known as:  GLUCOPHAGE      TAKE these medications     Indication   ARIPiprazole 20 MG tablet  Commonly known as:  ABILIFY  Take 1 tablet (20 mg total) by mouth every evening. For depression   Indication:  Major Depressive Disorder     gabapentin 100 MG capsule  Commonly known as:  NEURONTIN  Take 2 capsules (200 mg total) by mouth 3 (three) times  daily. For pain   Indication:  Neuropathic Pain, Pain     glimepiride 4 MG tablet  Commonly known as:  AMARYL  Take 1 tablet (4 mg total) by mouth daily with breakfast. For diabetes   Indication:  High Blood Sugar     hydrOXYzine 25 MG tablet  Commonly known as:  ATARAX/VISTARIL  Take 1 tablet (25 mg total) by mouth 3 (three) times daily.      insulin glargine 100 UNIT/ML injection  Commonly known as:  LANTUS  Inject 0.1 mLs (10 Units total) into the skin at bedtime. For diabetes   Indication:  Type 2 Diabetes     lisinopril 10 MG tablet  Commonly known as:  PRINIVIL,ZESTRIL  Take 1 tablet (10 mg total) by mouth daily. For blood pressure   Indication:  High Blood Pressure     nicotine 21 mg/24hr patch  Commonly known as:  NICODERM CQ - dosed in mg/24 hours  Place 1 patch (21 mg total) onto the skin daily. Nicotine dependence   Indication:  Nicotine Addiction     sertraline 100 MG tablet  Commonly known as:  ZOLOFT  Take 1 tablet (100 mg total) by mouth daily. For depression   Indication:  Major Depressive Disorder     simvastatin 20 MG tablet  Commonly known as:  ZOCOR  Take 1 tablet (20 mg total) by mouth daily at 6 PM. For high cholesterol   Indication:  Hyperlipedemia     traZODone 100 MG tablet  Commonly known as:  DESYREL  Take 1 tablet (100 mg total) by mouth at bedtime. For depression and insomnia   Indication:  Anxiety Disorder, Major Depressive Disorder       Follow-up Information   Follow up with [facility]. (Call Melissa at [phone] daily at 9:15AM to check bed availability. )    Contact information:   [address] [place], [place] [address] Phone: [phone] Fax: [phone]      Follow up with [facility] On 04/01/2014. (Assessment/screening with Amanda Peaon Swain on this date at 11:00AM for Substance Abuse Intensive Outpatient Program and Medication management. (Please be sure to cancel this appt if accepted to [facility]!) )    Contact information:   [address] [place], [place] [address] Phone: [phone] Fax: [phone]      Follow up with [facility]     On 03/28/2014. (Diabetes follow up.  Appt is at 9 am)    Contact information:   [address] [place] [address] [phone]      Follow-up recommendations:  Activity:  As tolerated Diet:  As tolerated  Comments:  1.  Take all your medications as prescribed.              2.  Report any adverse side effects to outpatient provider.  3.  Patient instructed to not use alcohol or illegal drugs while on prescription medicines.            4.  In the event of worsening symptoms, instructed patient to call 911, the crisis hotline or go to nearest emergency room for evaluation of symptoms.  Total Discharge Time:  Greater than 30 minutes.  SignedAdonis Brook MAY, AGNP-BC 03/27/2014, 3:40 PM   Patient was seen face to face for psychiatric evaluation, suicide risk assessment and case discussed with treatment team and NP and made appropriate disposition plans. Reviewed the information documented and agree with the treatment plan.     Jomarie Longs ,MD Attending Psychiatrist  [facility]

## 2014-03-27 NOTE — Clinical Social Work Note (Signed)
Darral DashLucretia [phone]) from [facility] contacted regarding referral for In home health assessment and request for cane. Lucretia processing request for cane currently and stated that Belenda CruiseKristin from [facility] will call pt directly to discuss In home Health assessment/schedule visit.  The Sherwin-WilliamsHeather Smart, LCSWA 03/27/2014 9:25 AM

## 2014-03-27 NOTE — Clinical Social Work Note (Signed)
CSW spoke with Belenda CruiseKristin at [facility]. Pt does not qualify for in home health services due to diagnosis/Medicaid criteria. Darral DashLucretia will bring cane by [facility] today prior to pt's d/c.   [address] North Vine LaneHeather Montgomery, LCSWA' 03/27/2014 10:52 AM

## 2014-03-27 NOTE — Progress Notes (Signed)
   D: Pt was pleasant and cooperative this morning. Pt was able to administer her insulin with incident.   A: Continue to monitor 1:1 for safety. Support and encouragement was offered.   R: Pt remains safe.

## 2014-03-27 NOTE — Progress Notes (Signed)
[facility] Adult Case Management Discharge Plan :  Will you be returning to the same living situation after discharge: Yes, home At discharge, do you have transportation home?:Yes,  friend Do you have the ability to pay for your medications:Yes,  [place]  Release of information consent forms completed and submitted to medical records by csw.  Patient to Follow up at: Follow-up Information   Follow up with [facility]. (Call Melissa at [phone] daily at 9:15AM to check bed availability. )    Contact information:   [address] [place], [place] [address] Phone: [phone] Fax: [phone]      Follow up with [facility] On 04/01/2014. (Assessment/screening with Amanda Peaon Swain on this date at 11:00AM for Substance Abuse Intensive Outpatient Program and Medication management. (Please be sure to cancel this appt if accepted to [facility]!) )    Contact information:   [address] [place], [place] [address] Phone: [phone] Fax: [phone]      [facility] with [facility]     On 03/28/2014. (Diabetes follow up.  Appt is at 9 am)    Contact information:   [address] [place] [address] [phone]      Patient denies SI/HI:   Yes,  self report    Safety Planning and Suicide Prevention discussed:  Yes,  SPE completd with pt's boyfriend. SPI pamphlet provided to pt and she was encouraged to share information with support network, ask questions, and talk about any concerns relating to SPE.  Smart, Omair Dettmer LCSWA  03/27/2014, 10:54 AM

## 2014-03-27 NOTE — [facility] Group Notes (Signed)
The focus of this group is to educate the patient on the purpose and policies of crisis stabilization and provide a format to answer questions about their admission.  The group details unit policies and expectations of patients while admitted. Patient did not attend this group. 

## 2014-03-27 NOTE — [facility] Suicide Risk Assessment (Addendum)
   Demographic Factors:  Divorced or widowed, Low socioeconomic status and Unemployed  Total Time spent with patient: 45 minutes  Psychiatric Specialty Exam: Physical Exam  ROS  Blood pressure 137/79, pulse 84, temperature 97.4 F (36.3 C), temperature source Oral, resp. rate 16, height 5' 1.25" (1.556 m), weight 65.318 kg (144 lb), SpO2 98.00%.Body mass index is 26.98 kg/(m^2).  General Appearance: Casual  Eye Contact::  Fair  Speech:  Clear and Coherent  Volume:  Normal  Mood:  Euthymic  Affect:  Appropriate  Thought Process:  Coherent  Orientation:  Full (Time, Place, and Person)  Thought Content:  WDL  Suicidal Thoughts:  No  Homicidal Thoughts:  No  Memory:  Immediate;   Fair Recent;   Fair Remote;   Fair  Judgement:  Fair  Insight:  Fair  Psychomotor Activity:  Normal  Concentration:  Fair  Recall:  Fis[employer]Fair  Fund of Knowledge:Fair  Language: Good  Akathisia:  No  Handed:  Right  AIMS (if indicated):     Assets:  Communication Skills Desire for Improvement Housing Social Support  Sleep:  Number of Hours: 5.5    Musculoskeletal: Strength & Muscle Tone: within normal limits Gait & Station: Pulte HomesWalks with a cane Patient leans: N/A   Mental Status Per Nursing Assessment::   On Admission:  Suicidal ideation indicated by patient  Current Mental Status by Physician: denies SI/HI/AH/VH  Loss Factors: Decrease in vocational status and Decline in physical health  Historical Factors: Prior suicide attempts and Impulsivity  Risk Reduction Factors:   Sense of responsibility to family and Positive social support  Continued Clinical Symptoms:  Alcohol/Substance Abuse/Dependencies Previous Psychiatric Diagnoses and Treatments  Cognitive Features That Contribute To Risk:  na  Suicide Risk:  Minimal: No identifiable suicidal ideation.  Patients presenting with no risk factors but with morbid ruminations; may be classified as minimal risk based on the severity of the  depressive symptoms  Discharge Diagnoses:   Primary Psychiatric Diagnosis:  Alcohol use disorder,severe   Secondary Psychiatric Diagnosis:  Alcohol withdrawal with perceptual disturbance (resolving)  Cocaine use disorder,severe  Substance (alcohol,cocaine) induced depressive disorder   Non Psychiatric Diagnosis:  DM  HTN    Past Medical History  Diagnosis Date  . Diabetes mellitus without complication   . Hypertension   . Depression     Plan Of Care/Follow-up recommendations:  Activity:  no restrictions  Is patient on multiple antipsychotic therapies at discharge:  No   Has Patient had three or more failed trials of antipsychotic monotherapy by history:  No  Recommended Plan for Multiple Antipsychotic Therapies: NA    Kenry Daubert MD 03/27/2014, 9:55 AM

## 2014-03-28 LAB — GLUCOSE, CAPILLARY
Glucose-Capillary: 211 mg/dL — ABNORMAL HIGH (ref 70–99)
Glucose-Capillary: 162 mg/dL — ABNORMAL HIGH (ref 70–99)
Glucose-Capillary: 178 mg/dL — ABNORMAL HIGH (ref 70–99)
Glucose-Capillary: 200 mg/dL — ABNORMAL HIGH (ref 70–99)

## 2014-04-01 ENCOUNTER — Encounter: Payer: Self-pay | Admitting: Family Medicine

## 2014-04-01 ENCOUNTER — Ambulatory Visit: Payer: Medicaid Other | Attending: Family Medicine | Admitting: Family Medicine

## 2014-04-01 VITALS — BP 103/70 | HR 82 | Temp 97.9°F | Resp 16 | Ht 62.0 in | Wt 153.0 lb

## 2014-04-01 DIAGNOSIS — E119 Type 2 diabetes mellitus without complications: Secondary | ICD-10-CM | POA: Insufficient documentation

## 2014-04-01 DIAGNOSIS — B351 Tinea unguium: Secondary | ICD-10-CM | POA: Insufficient documentation

## 2014-04-01 DIAGNOSIS — F32A Depression, unspecified: Secondary | ICD-10-CM

## 2014-04-01 DIAGNOSIS — N63 Unspecified lump in breast: Secondary | ICD-10-CM

## 2014-04-01 DIAGNOSIS — Z794 Long term (current) use of insulin: Secondary | ICD-10-CM

## 2014-04-01 DIAGNOSIS — F1410 Cocaine abuse, uncomplicated: Secondary | ICD-10-CM | POA: Diagnosis not present

## 2014-04-01 DIAGNOSIS — Z590 Homelessness: Secondary | ICD-10-CM | POA: Insufficient documentation

## 2014-04-01 DIAGNOSIS — F329 Major depressive disorder, single episode, unspecified: Secondary | ICD-10-CM | POA: Diagnosis not present

## 2014-04-01 DIAGNOSIS — N6320 Unspecified lump in the left breast, unspecified quadrant: Secondary | ICD-10-CM

## 2014-04-01 DIAGNOSIS — M79676 Pain in unspecified toe(s): Secondary | ICD-10-CM

## 2014-04-01 LAB — GLUCOSE, POCT (MANUAL RESULT ENTRY): POC Glucose: 80 mg/dl (ref 70–99)

## 2014-04-01 MED ORDER — METFORMIN HCL ER 500 MG PO TB24: 1000.0000 mg | ORAL_TABLET | Freq: Every day | ORAL | Status: DC

## 2014-04-01 MED ORDER — TERBINAFINE HCL 250 MG PO TABS: 250.0000 mg | ORAL_TABLET | Freq: Every day | ORAL | Status: DC

## 2014-04-01 NOTE — Progress Notes (Signed)
Patient Discharge Instructions:  After Visit Summary (AVS):   Faxed to:  04/01/14 Discharge Summary Note:   Faxed to:  04/01/14 Psychiatric Admission Assessment Note:   Faxed to:  04/01/14 Suicide Risk Assessment - Discharge Assessment:   Faxed to:  04/01/14 Faxed/Sent to the Next Level Care provider:  04/01/14 Next Level Care Provider Has Access to the EMR, 04/01/14 Faxed to [facility] @ [phone] Records provided to [facility] via CHL/Epic access. No documentation was faxed to [facility] for [facility].  Per the SW the info was already faxed.  Jerelene ReddenSheena E Dodge, 04/01/2014, 2:08 PM

## 2014-04-01 NOTE — Patient Instructions (Signed)
Ms. Dan HumphreysWalker,  Thank you for coming in today. It was a pleasure meeting you. I look forward to being your primary doctor.  1. DM2:  Continue lantus 10 U nightly Start metformin 500 mg XR after supper for first week, then 1000 mg (two tablets)  2. Toenail fungus: start terbinafine daily for 6 weeks,   3. Depression with insomnia: get trazadone filled at on site pharmacy.   Zero co-pay today  F/u in 6 weeks,  Dr. Armen PickupFunches

## 2014-04-01 NOTE — Assessment & Plan Note (Signed)
A: depression in the setting of cocaine abuse P: Continue abilify, zoloft Trazodone co-pay will be waived today

## 2014-04-01 NOTE — Assessment & Plan Note (Addendum)
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

## 2014-04-01 NOTE — Assessment & Plan Note (Signed)
A: cocaine abuse planning to go to in patient rehab  P:  Recommend cessation

## 2014-04-01 NOTE — Progress Notes (Signed)
Pt comes in today to establish care for Type 2 DM controlled by Insulin/Glimeperide,HTn  Pt was recently [facility] for suicidal ideation,Alcohol and Substance disorder Pt is currently homeless,living in hotel with 2 children  Denies suicidal ideation/thoughts at this time CBG-80, last A1c-9.7 Blurry vision,dizziness noted  Pt to see Tywan when discharged

## 2014-04-01 NOTE — Assessment & Plan Note (Signed)
A: onychomycosis with pain P:  Terbinafine x 6 weeks,  F/u LFTs Finish 6 week course if LFTs normal

## 2014-04-01 NOTE — Progress Notes (Signed)
   Subjective:    Patient ID: Robin Montgomery, female    DOB: [birth year], 53 y.o.   MRN: [id number] CC: establish care, diabetes, depression  HPI 53 yo F presents to establish care:  1. DM2: dx in 2014. Taking lantus and amaryl. Denies polyuria and polydipsia. Admits to tingling and numbness in feet.admits to black toenails with tenderness.  No history of foot ulcers. No known family history of DM2, patient adopted. Not checking blood sugars. No symptomatic lows.  2. Depression: taking abilify. Abusing cocaine last use today. Admits to SI. No plan. Was unable to get trazodone. Has trouble sleeping.    Review of Systems As per HPI GAD 7: score of 21, 3 across the board. Extremely difficult to function.     Objective:   Physical Exam BP 103/70  Pulse 82  Temp(Src) 97.9 F (36.6 C) (Oral)  Resp 16  Ht 5\' 2"  (1.575 m)  Wt 153 lb (69.4 kg)  BMI 27.98 kg/m2  SpO2 97% General appearance: alert, cooperative and no distress Lungs: clear to auscultation bilaterally Heart: regular rate and rhythm, S1, S2 normal, no murmur, click, rub or gallop Extremities: extremities normal, atraumatic, no cyanosis or edema Blackened toenials both feet, 3rd digit  Wrist: hard nodules b/l radial prominnence L>R, mildly tender   Lab Results  Component Value Date   HGBA1C 9.7* 03/20/2014        Assessment & Plan:

## 2014-04-02 LAB — MICROALBUMIN / CREATININE URINE RATIO
Creatinine, Urine: 129.3 mg/dL
Microalb Creat Ratio: 5.4 mg/g (ref 0.0–30.0)
Microalb, Ur: 0.7 mg/dL (ref <2.0)

## 2014-04-03 ENCOUNTER — Telehealth: Payer: Self-pay | Admitting: *Deleted

## 2014-04-03 LAB — POCT URINALYSIS DIPSTICK
Bilirubin, UA: NEGATIVE
Blood, UA: NEGATIVE
Glucose, UA: NEGATIVE
Ketones, UA: NEGATIVE
Leukocytes, UA: NEGATIVE
Nitrite, UA: NEGATIVE
Protein, UA: NEGATIVE
Spec Grav, UA: 1.02
Urobilinogen, UA: 0.2
pH, UA: 5.5

## 2014-04-03 NOTE — Telephone Encounter (Signed)
Left message with Maurine MinisterDennis, if any question return call

## 2014-04-03 NOTE — Telephone Encounter (Signed)
Message copied by Dyann KiefGIRALDEZ, Areon Cocuzza M on Thu Apr 03, 2014 12:59 PM ------      Message from: Dessa PhiFUNCHES, JOSALYN      Created: Wed Apr 02, 2014 11:42 AM       Normal urine albumin creatinine ratio ------

## 2014-04-18 ENCOUNTER — Telehealth: Payer: Self-pay | Admitting: Family Medicine

## 2014-04-18 ENCOUNTER — Other Ambulatory Visit: Payer: Self-pay | Admitting: *Deleted

## 2014-04-18 ENCOUNTER — Other Ambulatory Visit: Payer: Self-pay | Admitting: Family Medicine

## 2014-04-18 MED ORDER — INSULIN SYRINGES (DISPOSABLE) U-100 0.5 ML MISC
1.0000 | Freq: Every day | Status: DC
Start: 2014-04-18 — End: 2015-02-15

## 2014-04-18 MED ORDER — INSULIN PEN NEEDLE 31G X 8 MM MISC: 1.0000 | Freq: Every day | Status: DC

## 2014-04-18 NOTE — Telephone Encounter (Signed)
Rx for syringes and needles e-screed.Pt aware

## 2014-04-18 NOTE — Telephone Encounter (Signed)
Pt ran out of needles for her insulin. She has enough insulin but no more needles and the pharmacy did not allow her to fill them without a prescription. Please follow up with patient.

## 2014-04-18 NOTE — Telephone Encounter (Signed)
Insulin needles and syringes prescribed.

## 2014-04-18 NOTE — Telephone Encounter (Signed)
Patient is calling to have the short needles prescribed switched to the long needles instead; please f/u with Weston BrassNick @ [facility]; [phone]

## 2014-04-28 ENCOUNTER — Ambulatory Visit: Payer: Self-pay | Admitting: Family Medicine

## 2014-09-01 ENCOUNTER — Other Ambulatory Visit: Payer: Self-pay | Admitting: Internal Medicine

## 2014-09-01 DIAGNOSIS — N6320 Unspecified lump in the left breast, unspecified quadrant: Secondary | ICD-10-CM

## 2014-09-09 ENCOUNTER — Ambulatory Visit
Admission: RE | Admit: 2014-09-09 | Discharge: 2014-09-09 | Disposition: A | Discharge status: Home / Self Care | Payer: Medicaid Other | Source: Ambulatory Visit | Attending: Internal Medicine | Admitting: Internal Medicine

## 2014-09-09 DIAGNOSIS — N6320 Unspecified lump in the left breast, unspecified quadrant: Secondary | ICD-10-CM

## 2014-09-12 ENCOUNTER — Encounter: Payer: Self-pay | Admitting: Family Medicine

## 2014-09-12 ENCOUNTER — Other Ambulatory Visit: Payer: Self-pay

## 2014-09-12 ENCOUNTER — Ambulatory Visit (HOSPITAL_BASED_OUTPATIENT_CLINIC_OR_DEPARTMENT_OTHER): Payer: Medicaid Other | Admitting: Family Medicine

## 2014-09-12 ENCOUNTER — Ambulatory Visit (HOSPITAL_COMMUNITY)
Admission: RE | Admit: 2014-09-12 | Discharge: 2014-09-12 | Disposition: A | Discharge status: Home / Self Care | Payer: Medicaid Other | Source: Ambulatory Visit | Attending: Cardiology | Admitting: Cardiology

## 2014-09-12 VITALS — BP 112/74 | HR 86 | Temp 97.6°F | Resp 18 | Ht 62.0 in | Wt 156.0 lb

## 2014-09-12 DIAGNOSIS — B351 Tinea unguium: Secondary | ICD-10-CM | POA: Insufficient documentation

## 2014-09-12 DIAGNOSIS — R0602 Shortness of breath: Secondary | ICD-10-CM | POA: Diagnosis not present

## 2014-09-12 DIAGNOSIS — J302 Other seasonal allergic rhinitis: Secondary | ICD-10-CM | POA: Diagnosis not present

## 2014-09-12 DIAGNOSIS — Z87891 Personal history of nicotine dependence: Secondary | ICD-10-CM | POA: Insufficient documentation

## 2014-09-12 DIAGNOSIS — Z720 Tobacco use: Secondary | ICD-10-CM

## 2014-09-12 DIAGNOSIS — Z794 Long term (current) use of insulin: Secondary | ICD-10-CM | POA: Diagnosis not present

## 2014-09-12 DIAGNOSIS — L989 Disorder of the skin and subcutaneous tissue, unspecified: Secondary | ICD-10-CM | POA: Diagnosis not present

## 2014-09-12 DIAGNOSIS — E119 Type 2 diabetes mellitus without complications: Secondary | ICD-10-CM | POA: Insufficient documentation

## 2014-09-12 DIAGNOSIS — Z0000 Encounter for general adult medical examination without abnormal findings: Secondary | ICD-10-CM

## 2014-09-12 DIAGNOSIS — M79676 Pain in unspecified toe(s): Secondary | ICD-10-CM

## 2014-09-12 DIAGNOSIS — R21 Rash and other nonspecific skin eruption: Secondary | ICD-10-CM

## 2014-09-12 DIAGNOSIS — F17200 Nicotine dependence, unspecified, uncomplicated: Secondary | ICD-10-CM

## 2014-09-12 LAB — GLUCOSE, POCT (MANUAL RESULT ENTRY): POC Glucose: 156 mg/dl — AB (ref 70–99)

## 2014-09-12 LAB — POCT GLYCOSYLATED HEMOGLOBIN (HGB A1C): Hemoglobin A1C: 7.8

## 2014-09-12 MED ORDER — ALBUTEROL SULFATE HFA 108 (90 BASE) MCG/ACT IN AERS: 1.0000 | INHALATION_SPRAY | Freq: Four times a day (QID) | RESPIRATORY_TRACT | Status: DC | PRN

## 2014-09-12 MED ORDER — TRIAMCINOLONE ACETONIDE 55 MCG/ACT NA AERO: 2.0000 | INHALATION_SPRAY | Freq: Every day | NASAL | Status: DC

## 2014-09-12 MED ORDER — TERBINAFINE HCL 250 MG PO TABS: 250.0000 mg | ORAL_TABLET | Freq: Every day | ORAL | Status: DC

## 2014-09-12 MED ORDER — ACCU-CHEK AVIVA PLUS W/DEVICE KIT: 1.0000 | PACK | Freq: Three times a day (TID) | Status: DC

## 2014-09-12 MED ORDER — ACCU-CHEK SOFTCLIX LANCET DEV KIT: 1.0000 | PACK | Freq: Three times a day (TID) | Status: DC

## 2014-09-12 MED ORDER — METFORMIN HCL 500 MG PO TABS: 500.0000 mg | ORAL_TABLET | Freq: Two times a day (BID) | ORAL | Status: DC

## 2014-09-12 MED ORDER — GLUCOSE BLOOD VI STRP: 1.0000 | ORAL_STRIP | Freq: Three times a day (TID) | Status: DC

## 2014-09-12 MED ORDER — ACCU-CHEK SOFT TOUCH LANCETS MISC: 1.0000 | Freq: Three times a day (TID) | Status: DC

## 2014-09-12 MED ORDER — CETIRIZINE HCL 10 MG PO TABS: 10.0000 mg | ORAL_TABLET | Freq: Every day | ORAL | Status: DC

## 2014-09-12 MED ORDER — ACCU-CHEK NANO SMARTVIEW W/DEVICE KIT: 1.0000 | PACK | Status: DC | PRN

## 2014-09-12 MED ORDER — ACCU-CHEK FASTCLIX LANCETS MISC
1.0000 | Freq: Three times a day (TID) | Status: DC
Start: 2014-09-12 — End: 2014-09-12

## 2014-09-12 NOTE — Assessment & Plan Note (Signed)
Shortness of breath with chest pressure: EKG is normal  I suspect this is from seasonal allergies and smoking Albuterol  Zyrtec daily  nasacort in the evening

## 2014-09-12 NOTE — Assessment & Plan Note (Signed)
.   Diabetes: Meter, lancet, test strips ordered A1c is great! Well done  Continue lantus 10 U nightly Continue metformin 500 mg twice daily   Goal blood sugars Fasting (in AM before breakfast): 90-110 Post prandial: < 160,  2 hrs after meals  No low sugars: nothing < 70

## 2014-09-12 NOTE — Progress Notes (Signed)
F/U DM toe nail fungal  Complaining of black spot on legs

## 2014-09-12 NOTE — Progress Notes (Signed)
   Subjective:    Patient ID: Robin Montgomery, female    DOB: September 12, 1960, 54 y.o.   MRN: [id number]  HPI  1. CHRONIC DIABETES  Disease Monitoring  Blood Sugar Ranges: not checking   Polyuria: no   Visual problems: no   Medication Compliance: yes  Medication Side Effects  Hypoglycemia: no   Preventitive Health Care  Eye Exam: due   Foot Exam: done today    2. Toenail fungus: took 30 day course. Did not come back for refill. Still having chipping and thickening of toenails.  3. SOB:  with chest pressure. X 2 months.  Albuterol helps. No syncope, dizziness, sweating, nausea.   4. Skin lesions: x 4 weeks. On arms and legs. No pain. No itching. No treatment.  Soc Hx: smoker, cutting back. Working to quit  Review of Systems  Constitutional: Negative for chills and fatigue.  Respiratory: Positive for chest tightness and shortness of breath. Negative for cough.   Cardiovascular: Positive for chest pain.      Objective:   Physical Exam BP 112/74 mmHg  Pulse 86  Temp(Src) 97.6 F (36.4 C) (Oral)  Resp 18  Ht 5\' 2"  (1.575 m)  Wt 156 lb (70.761 kg)  BMI 28.53 kg/m2  SpO2 96%  Wt Readings from Last 3 Encounters:  09/12/14 156 lb (70.761 kg)  04/01/14 153 lb (69.4 kg)  03/19/14 144 lb (65.318 kg)  General appearance: alert, cooperative and no distress  Lungs: clear to auscultation bilaterally Heart: regular rate and rhythm, S1, S2 normal, no murmur, click, rub or gallop Extremities: extremities normal, atraumatic, no cyanosis or edema  Skin: hyperpigmented papules on arms and legs. No lesions on trunk.   Lab Results  Component Value Date   HGBA1C 7.80 09/12/2014   EKG: normal EKG, normal sinus rhythm.      Assessment & Plan:

## 2014-09-12 NOTE — Patient Instructions (Addendum)
Ms. Dan HumphreysWalker,  1. Diabetes: Meter, lancet, test strips ordered A1c is great! Well done  Continue lantus 10 U nightly Continue metformin 500 mg twice daily   Goal blood sugars Fasting (in AM before breakfast): 90-110 Post prandial: < 160,  2 hrs after meals  No low sugars: nothing < 70   2. Skin lesions: I suspect fungal.  Restart lamisil 250 mg daily for 12 weeks   3. Toenail fungus:  lamisil 250 mg daily for 12 weeks  4. Shortness of breath with chest pressure: EKG is normal  I suspect this is from seasonal allergies and smoking Albuterol  Zyrtec daily  nasacort in the evening   F/u in 3 months for diabetes   Dr. Armen PickupFunches

## 2014-09-12 NOTE — Assessment & Plan Note (Signed)
Skin lesions: I suspect fungal.  Restart lamisil 250 mg daily for 12 weeks

## 2014-09-23 ENCOUNTER — Encounter: Payer: Self-pay | Admitting: Emergency Medicine

## 2014-09-23 ENCOUNTER — Ambulatory Visit (INDEPENDENT_AMBULATORY_CARE_PROVIDER_SITE_OTHER): Payer: Medicaid Other | Admitting: Emergency Medicine

## 2014-09-23 VITALS — BP 110/80 | HR 80 | Ht 62.0 in | Wt 156.0 lb

## 2014-09-23 DIAGNOSIS — R1310 Dysphagia, unspecified: Secondary | ICD-10-CM

## 2014-09-23 DIAGNOSIS — F17200 Nicotine dependence, unspecified, uncomplicated: Secondary | ICD-10-CM

## 2014-09-23 DIAGNOSIS — R0602 Shortness of breath: Secondary | ICD-10-CM

## 2014-09-23 DIAGNOSIS — Z720 Tobacco use: Secondary | ICD-10-CM | POA: Diagnosis not present

## 2014-09-23 NOTE — Patient Instructions (Signed)
We will perform pulmonary function testing to check your airflow Please continue your pro-air 2 puffs up to 4 times a day if needed for shortness of breath Continue to work hard on decreasing your cigarettes. We will help you with this as we go forward We will refer you to see a gastroenterologist to look into her swallowing difficulty, shortness of breath after eating, and food getting stuck.  Follow with Dr Delton CoombesByrum in 1 month

## 2014-09-23 NOTE — Progress Notes (Signed)
Subjective:    Patient ID: Robin Montgomery, female    DOB: Sep 10, 1960, 54 y.o.   MRN: [id number]  HPI 54 year old woman active smoker (40 pk-yrs) with a history of diabetes, hypertension,depression. She is referred today for SOB that seems to correlate very well with meals. She eats, then feels a severe fullness and pain. Sometimes has emesis that seems to give immediate relief (not always). She tried taking TUMS without any change. She also has exertional dyspnea that is different > not associated with eating. She has a persistent dry cough, happens w exertion.  She hears wheezing sometimes either with exertion or rest. She was started on albuterol q6h a few months ago > this helps her wheeze.   Review of Systems  Constitutional: Negative for fever and unexpected weight change.  HENT: Negative for congestion, dental problem, ear pain, nosebleeds, postnasal drip, rhinorrhea, sinus pressure, sneezing, sore throat and trouble swallowing.   Eyes: Negative for redness and itching.  Respiratory: Positive for cough and shortness of breath. Negative for chest tightness and wheezing.   Cardiovascular: Positive for chest pain. Negative for palpitations and leg swelling.  Gastrointestinal: Negative for nausea and vomiting.  Genitourinary: Negative for dysuria.  Musculoskeletal: Negative for joint swelling.  Skin: Negative for rash.  Neurological: Negative for headaches.  Hematological: Does not bruise/bleed easily.  Psychiatric/Behavioral: Negative for dysphoric mood. The patient is not nervous/anxious.     Past Medical History  Diagnosis Date  . Diabetes mellitus without complication   . Hypertension   . Depression      No family history on file.   History   Social History  . Marital Status: Single    Spouse Name: N/A  . Number of Children: N/A  . Years of Education: N/A   Occupational History  . Not on file.   Social History Main Topics  . Smoking status: Current Every Day  Smoker  . Smokeless tobacco: Not on file     Comment: states she cut back to 4 cigarettes/per day  . Alcohol Use: Yes     Comment: as much as she can for the last month  . Drug Use: Yes    Special: Cocaine     Comment: crack  . Sexual Activity: Not on file   Other Topics Concern  . Not on file   Social History Narrative     Allergies  Allergen Reactions  . Aspirin Other (See Comments)    unknown     Outpatient Prescriptions Prior to Visit  Medication Sig Dispense Refill  . albuterol (PROVENTIL HFA;VENTOLIN HFA) 108 (90 BASE) MCG/ACT inhaler Inhale 1 puff into the lungs every 6 (six) hours as needed for wheezing or shortness of breath. 6.7 g 6  . benztropine (COGENTIN) 2 MG tablet Take 2 mg by mouth at bedtime.     . Blood Glucose Monitoring Suppl (ACCU-CHEK AVIVA PLUS) W/DEVICE KIT 1 Device by Does not apply route 3 (three) times daily after meals. 1 kit 0  . cetirizine (ZYRTEC) 10 MG tablet Take 1 tablet (10 mg total) by mouth daily. 30 tablet 11  . glucose blood (ACCU-CHEK AVIVA PLUS) test strip 1 each by Other route 3 (three) times daily. 100 each 12  . insulin glargine (LANTUS) 100 UNIT/ML injection Inject 0.1 mLs (10 Units total) into the skin at bedtime. For diabetes 10 mL 11  . Insulin Pen Needle (PX SHORTLENGTH PEN NEEDLES) 31G X 8 MM MISC 1 each by Does not apply route  at bedtime. 100 each 11  . Insulin Syringes, Disposable, U-100 0.5 ML MISC 1 each by Does not apply route at bedtime. 100 each 11  . Lancets (ACCU-CHEK SOFT TOUCH) lancets 1 each by Other route 3 (three) times daily. 100 each 12  . Lancets Misc. (ACCU-CHEK SOFTCLIX LANCET DEV) KIT 1 each by Does not apply route 3 (three) times daily. 1 kit 0  . lisinopril (PRINIVIL,ZESTRIL) 10 MG tablet Take 1 tablet (10 mg total) by mouth daily. For blood pressure 30 tablet 0  . metFORMIN (GLUCOPHAGE) 500 MG tablet Take 1 tablet (500 mg total) by mouth 2 (two) times daily with a meal. 60 tablet 5  . QUEtiapine (SEROQUEL)  200 MG tablet Take 400 mg by mouth at bedtime.     . sertraline (ZOLOFT) 100 MG tablet Take 1 tablet (100 mg total) by mouth daily. For depression 30 tablet 0  . simvastatin (ZOCOR) 20 MG tablet Take 1 tablet (20 mg total) by mouth daily at 6 PM. For high cholesterol 30 tablet 0  . terbinafine (LAMISIL) 250 MG tablet Take 1 tablet (250 mg total) by mouth daily. 30 tablet 2  . traZODone (DESYREL) 100 MG tablet Take 1 tablet (100 mg total) by mouth at bedtime. For depression and insomnia 30 tablet 0  . triamcinolone (NASACORT AQ) 55 MCG/ACT AERO nasal inhaler Place 2 sprays into the nose daily. 1 Inhaler 12   No facility-administered medications prior to visit.       Objective:   Physical Exam Filed Vitals:   09/23/14 0928  BP: 110/80  Pulse: 80  Height: _0  (1.575 m)  Weight: 156 lb (70.761 kg)  SpO2: 100%   Gen: Pleasant, overwt woman, in no distress,  normal affect  ENT: No lesions,  mouth clear,  oropharynx clear, no postnasal drip  Neck: No JVD, no TMG, no carotid bruits  Lungs: No use of accessory muscles, somewhat small breaths, clear without rales or rhonchi  Cardiovascular: RRR, heart sounds normal, no murmur or gallops, no peripheral edema  Musculoskeletal: No deformities, no cyanosis or clubbing  Neuro: alert, non focal  Skin: Warm, no lesions or rashes      Assessment & Plan:  Smoker She is working on smoking cessation. She is down to 4 cigarettes a day. We discussed continued efforts at cutting down to 0. We will work on this further at our future visits   Shortness of breath It sounds like she has 2 distinct issues. Clearly she has exertional shortness of breath that is consistent with COPD. We need to work this up PFT and adjust her bronchodilator regimen. She also has abdominal fullness, chest pain, and the feeling of esophageal dysmotility or obstruction that are distinct from her exertional symptoms. I suspect that she has either severe GERD or an  anatomical lesion that's impacting her swallowing. Will refer her to see gastroenterology to assess, check CXR  We will perform pulmonary function testing to check your airflow Please continue your pro-air 2 puffs up to 4 times a day if needed for shortness of breath Continue to work hard on decreasing your cigarettes. We will help you with this as we go forward We will refer you to see a gastroenterologist to look into her swallowing difficulty, shortness of breath after eating, and food getting stuck.  Follow with Dr Lamonte Sakai in 1 month

## 2014-09-23 NOTE — Assessment & Plan Note (Addendum)
It sounds like she has 2 distinct issues. Clearly she has exertional shortness of breath that is consistent with COPD. We need to work this up PFT and adjust her bronchodilator regimen. She also has abdominal fullness, chest pain, and the feeling of esophageal dysmotility or obstruction that are distinct from her exertional symptoms. I suspect that she has either severe GERD or an anatomical lesion that's impacting her swallowing. Will refer her to see gastroenterology to assess, check CXR  We will perform pulmonary function testing to check your airflow Please continue your pro-air 2 puffs up to 4 times a day if needed for shortness of breath Continue to work hard on decreasing your cigarettes. We will help you with this as we go forward We will refer you to see a gastroenterologist to look into her swallowing difficulty, shortness of breath after eating, and food getting stuck.  Follow with Dr Delton CoombesByrum in 1 month

## 2014-09-23 NOTE — Assessment & Plan Note (Signed)
She is working on smoking cessation. She is down to 4 cigarettes a day. We discussed continued efforts at cutting down to 0. We will work on this further at our future visits

## 2014-09-25 ENCOUNTER — Ambulatory Visit (INDEPENDENT_AMBULATORY_CARE_PROVIDER_SITE_OTHER): Payer: Medicaid Other | Admitting: Emergency Medicine

## 2014-09-25 DIAGNOSIS — R0602 Shortness of breath: Secondary | ICD-10-CM

## 2014-09-25 LAB — PULMONARY FUNCTION TEST
DL/VA % pred: 99 %
DL/VA: 4.69 ml/min/mmHg/L
DLCO unc % pred: 64 %
DLCO unc: 15.15 ml/min/mmHg
FEF 25-75 Post: 2.38 L/sec
FEF 25-75 Pre: 2 L/sec
FEF2575-%Change-Post: 18 %
FEF2575-%Pred-Post: 104 %
FEF2575-%Pred-Pre: 88 %
FEV1-%Change-Post: 5 %
FEV1-%Pred-Post: 77 %
FEV1-%Pred-Pre: 73 %
FEV1-Post: 1.69 L
FEV1-Pre: 1.61 L
FEV1FVC-%Change-Post: 0 %
FEV1FVC-%Pred-Pre: 104 %
FEV6-%Change-Post: 6 %
FEV6-%Pred-Post: 75 %
FEV6-%Pred-Pre: 70 %
FEV6-Post: 2 L
FEV6-Pre: 1.89 L
FEV6FVC-%Pred-Post: 103 %
FEV6FVC-%Pred-Pre: 103 %
FVC-%Change-Post: 6 %
FVC-%Pred-Post: 72 %
FVC-%Pred-Pre: 68 %
FVC-Post: 2 L
FVC-Pre: 1.89 L
Post FEV1/FVC ratio: 84 %
Post FEV6/FVC ratio: 100 %
Pre FEV1/FVC ratio: 85 %
Pre FEV6/FVC Ratio: 100 %
RV % pred: 80 %
RV: 1.48 L
TLC % pred: 73 %
TLC: 3.65 L

## 2014-09-25 NOTE — Progress Notes (Signed)
PFT done today. 

## 2014-10-28 ENCOUNTER — Ambulatory Visit: Payer: Medicaid Other | Admitting: Emergency Medicine

## 2014-11-04 ENCOUNTER — Other Ambulatory Visit (HOSPITAL_COMMUNITY): Payer: Self-pay | Admitting: Psychiatry

## 2014-12-22 ENCOUNTER — Encounter: Payer: Self-pay | Admitting: Family Medicine

## 2014-12-22 ENCOUNTER — Ambulatory Visit: Payer: Medicaid Other | Attending: Family Medicine | Admitting: Family Medicine

## 2014-12-22 ENCOUNTER — Other Ambulatory Visit: Payer: Self-pay | Admitting: Pharmacist

## 2014-12-22 VITALS — BP 127/79 | HR 84 | Temp 97.6°F | Resp 16 | Ht 62.0 in | Wt 162.0 lb

## 2014-12-22 DIAGNOSIS — G8929 Other chronic pain: Secondary | ICD-10-CM

## 2014-12-22 DIAGNOSIS — M25532 Pain in left wrist: Secondary | ICD-10-CM | POA: Insufficient documentation

## 2014-12-22 DIAGNOSIS — Z794 Long term (current) use of insulin: Secondary | ICD-10-CM

## 2014-12-22 DIAGNOSIS — F17200 Nicotine dependence, unspecified, uncomplicated: Secondary | ICD-10-CM | POA: Diagnosis not present

## 2014-12-22 DIAGNOSIS — E119 Type 2 diabetes mellitus without complications: Secondary | ICD-10-CM | POA: Diagnosis not present

## 2014-12-22 DIAGNOSIS — M25552 Pain in left hip: Secondary | ICD-10-CM | POA: Diagnosis not present

## 2014-12-22 DIAGNOSIS — M25531 Pain in right wrist: Secondary | ICD-10-CM | POA: Diagnosis not present

## 2014-12-22 DIAGNOSIS — M7062 Trochanteric bursitis, left hip: Secondary | ICD-10-CM | POA: Insufficient documentation

## 2014-12-22 DIAGNOSIS — M25539 Pain in unspecified wrist: Secondary | ICD-10-CM | POA: Diagnosis not present

## 2014-12-22 LAB — GLUCOSE, POCT (MANUAL RESULT ENTRY): POC Glucose: 192 mg/dl — AB (ref 70–99)

## 2014-12-22 LAB — POCT GLYCOSYLATED HEMOGLOBIN (HGB A1C): Hemoglobin A1C: 8.7

## 2014-12-22 MED ORDER — METFORMIN HCL 1000 MG PO TABS: 1000.0000 mg | ORAL_TABLET | Freq: Two times a day (BID) | ORAL | Status: DC

## 2014-12-22 MED ORDER — INSULIN PEN NEEDLE 31G X 8 MM MISC: 1.0000 | Freq: Every day | Status: DC

## 2014-12-22 MED ORDER — INSULIN GLARGINE 100 UNIT/ML SOLOSTAR PEN: 15.0000 [IU] | PEN_INJECTOR | Freq: Every day | SUBCUTANEOUS | Status: DC

## 2014-12-22 MED ORDER — DICLOFENAC SODIUM 75 MG PO TBEC: 75.0000 mg | DELAYED_RELEASE_TABLET | Freq: Two times a day (BID) | ORAL | Status: DC | PRN

## 2014-12-22 MED ORDER — INSULIN PEN NEEDLE 32G X 4 MM MISC: 1.0000 | Freq: Every day | Status: DC

## 2014-12-22 NOTE — Assessment & Plan Note (Signed)
L trochanteric bursitis:  L lateral hip pain consistent with trochanteric bursitis  Diclofenac for pain Exercises  Keep in mind that an injection is also an option

## 2014-12-22 NOTE — Addendum Note (Signed)
Addended by: Dessa PhiFUNCHES, Emmalia Heyboer on: 12/22/2014 12:29 PM   Modules accepted: Orders

## 2014-12-22 NOTE — Assessment & Plan Note (Signed)
Diabetes: Declined with rise in A1c and sugars over 200 at home  lantus 15 U daily, pen ordered Metformin 1000 mg BID

## 2014-12-22 NOTE — Progress Notes (Addendum)
   Subjective:    Patient ID: Robin Montgomery, female    DOB: March 30, 1961, 54 y.o.   MRN: [id number] CC: DM2 f/u, L hip and L wrist pain  HPI  1. CHRONIC DIABETES  Disease Monitoring  Blood Sugar Ranges: 200s   Polyuria: yes   Visual problems: yes, a little    Medication Compliance: yes  Medication Side Effects  Hypoglycemia: no   Preventitive Health Care  Eye Exam: yes, need glasses but cannot afford them   Foot Exam: done   Diet pattern: eating heavily, not eating low carb   Exercise: starting back walking this AM, went around the block 3 times   2.  L hip: x 3-4 months. Lateral hip and gluteal pain. No injiury at onset. Pain is burning. Patient is falling. No rash.   3. B/l wrist pain: radial wrist pain started 2 years ago. Worsening. Noticing increased bony prominence of distal radius. No finger or hand numbness or tingling.  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   Soc Hx: current smoker  Review of Systems  Constitutional: Negative for fever and chills.  Eyes: Positive for visual disturbance.  Respiratory: Negative for shortness of breath.   Cardiovascular: Negative for chest pain.  Gastrointestinal: Negative for abdominal pain and blood in stool.  Endocrine: Positive for polydipsia, polyphagia and polyuria.  Musculoskeletal: Positive for myalgias, arthralgias and gait problem.  Skin: Negative for rash.  Psychiatric/Behavioral: Negative for suicidal ideas and dysphoric mood.   GAD-7:score of 10. 1-1-3, 6,7. 204, 3-5.     Objective:   Physical Exam BP 127/79 mmHg  Pulse 84  Temp(Src) 97.6 F (36.4 C) (Oral)  Resp 16  Ht 5\' 2"  (1.575 m)  Wt 162 lb (73.483 kg)  BMI 29.62 kg/m2  SpO2 97% General appearance: alert, cooperative and no distress Lungs: normal WOB  Extremities: B/l radial wrist bony prominence non tender, no rash or lesions L hip lateral tenderness at great trochanter that  is exacerbated by hip flexion and external rotation  extremities normal, atraumatic, no cyanosis or edema Skin: Skin color, texture, turgor normal. No rashes or lesions  Lab Results  Component Value Date   HGBA1C 8.70 12/22/2014   CBG 192      Assessment & Plan:

## 2014-12-22 NOTE — Assessment & Plan Note (Addendum)
B/l wrist pain with bony protrusion: suspect arthritis  Diclofenac twice daily as needed with food

## 2014-12-22 NOTE — Progress Notes (Signed)
Complaining lt hip and wrist pain Hx tobacco-4 cigar  per day

## 2014-12-22 NOTE — Patient Instructions (Signed)
Robin Montgomery,  Thank you for coming in today  1. B/l wrist pain with bony protrusion: suspect arthritis  Diclofenac twice daily as needed with food  2. L trochanteric bursitis:  L lateral hip pain consistent with trochanteric bursitis Diclofenac for pain Exercises  Keep in mind that an injection is also an option   3. Diabetes: Declined with rise in A1c and sugars over 200 at home lantus 15 U daily, pen ordered Metformin 1000 mg BID  Diabetes blood sugar goals  Fasting (in AM before breakfast, 8 hrs of no eating or drinking (except water or unsweetened coffee or tea): 90-110 2 hrs after meals: < 160,   No low sugars: nothing < 70   F/u in 3 weeks with RN CBG check  F/u with me in 6 weeks for wrist and hip pain  Dr. Armen PickupFunches   Trochanteric Bursitis You have hip pain due to trochanteric bursitis. Bursitis means that the sack near the outside of the hip is filled with fluid and inflamed. This sack is made up of protective soft tissue. The pain from trochanteric bursitis can be severe and keep you from sleep. It can radiate to the buttocks or down the outside of the thigh to the knee. The pain is almost always worse when rising from the seated or lying position and with walking. Pain can improve after you take a few steps. It happens more often in people with hip joint and lumbar spine problems, such as arthritis or previous surgery. Very rarely the trochanteric bursa can become infected, and antibiotics and/or surgery may be needed. Treatment often includes an injection of local anesthetic mixed with cortisone medicine. This medicine is injected into the area where it is most tender over the hip. Repeat injections may be necessary if the response to treatment is slow. You can apply ice packs over the tender area for 30 minutes every 2 hours for the next few days. Anti-inflammatory and/or narcotic pain medicine may also be helpful. Limit your activity for the next few days if the pain  continues. See your caregiver in 5-10 days if you are not greatly improved.  SEEK IMMEDIATE MEDICAL CARE IF:  You develop severe pain, fever, or increased redness.  You have pain that radiates below the knee. EXERCISES STRETCHING EXERCISES - Trochanteric Bursitis  These exercises may help you when beginning to rehabilitate your injury. Your symptoms may resolve with or without further involvement from your physician, physical therapist, or athletic trainer. While completing these exercises, remember:   Restoring tissue flexibility helps normal motion to return to the joints. This allows healthier, less painful movement and activity.  An effective stretch should be held for at least 30 seconds.  A stretch should never be painful. You should only feel a gentle lengthening or release in the stretched tissue. STRETCH - Iliotibial Band  On the floor or bed, lie on your side so your injured leg is on top. Bend your knee and grab your ankle.  Slowly bring your knee back so that your thigh is in line with your trunk. Keep your heel at your buttocks and gently arch your back so your head, shoulders and hips line up.  Slowly lower your leg so that your knee approaches the floor/bed until you feel a gentle stretch on the outside of your thigh. If you do not feel a stretch and your knee will not fall farther, place the heel of your opposite foot on top of your knee and pull your thigh  down farther.  Hold this stretch for __________ seconds.  Repeat __________ times. Complete this exercise __________ times per day. STRETCH - Hamstrings, Supine   Lie on your back. Loop a belt or towel over the ball of your foot as shown.  Straighten your knee and slowly pull on the belt to raise your injured leg. Do not allow the knee to bend. Keep your opposite leg flat on the floor.  Raise the leg until you feel a gentle stretch behind your knee or thigh. Hold this position for __________ seconds.  Repeat  __________ times. Complete this stretch __________ times per day. STRETCH - Quadriceps, Prone   Lie on your stomach on a firm surface, such as a bed or padded floor.  Bend your knee and grasp your ankle. If you are unable to reach your ankle or pant leg, use a belt around your foot to lengthen your reach.  Gently pull your heel toward your buttocks. Your knee should not slide out to the side. You should feel a stretch in the front of your thigh and/or knee.  Hold this position for __________ seconds.  Repeat __________ times. Complete this stretch __________ times per day. STRETCHING - Hip Flexors, Lunge Half kneel with your knee on the floor and your opposite knee bent and directly over your ankle.  Keep good posture with your head over your shoulders. Tighten your buttocks to point your tailbone downward; this will prevent your back from arching too much.  You should feel a gentle stretch in the front of your thigh and/or hip. If you do not feel any resistance, slightly slide your opposite foot forward and then slowly lunge forward so your knee once again lines up over your ankle. Be sure your tailbone remains pointed downward.  Hold this stretch for __________ seconds.  Repeat __________ times. Complete this stretch __________ times per day. STRETCH - Adductors, Lunge  While standing, spread your legs.  Lean away from your injured leg by bending your opposite knee. You may rest your hands on your thigh for balance.  You should feel a stretch in your inner thigh. Hold for __________ seconds.  Repeat __________ times. Complete this exercise __________ times per day. Document Released: 06/30/2004 Document Revised: 10/07/2013 Document Reviewed: 09/04/2008 [facility] Patient Information 2015 [place], [place]. This information is not intended to replace advice given to you by your health care provider. Make sure you discuss any questions you have with your health care provider.

## 2015-02-05 ENCOUNTER — Encounter: Payer: Self-pay | Admitting: Pharmacist

## 2015-02-11 ENCOUNTER — Emergency Department (HOSPITAL_COMMUNITY)
Admission: EM | Admit: 2015-02-11 | Discharge: 2015-02-15 | Disposition: A | Discharge status: Other Acute Inpatient Hospital | Payer: Medicaid Other | Attending: Emergency Medicine | Admitting: Emergency Medicine

## 2015-02-11 ENCOUNTER — Encounter (HOSPITAL_COMMUNITY): Payer: Self-pay | Admitting: Emergency Medicine

## 2015-02-11 DIAGNOSIS — Z794 Long term (current) use of insulin: Secondary | ICD-10-CM

## 2015-02-11 DIAGNOSIS — F332 Major depressive disorder, recurrent severe without psychotic features: Secondary | ICD-10-CM | POA: Diagnosis present

## 2015-02-11 DIAGNOSIS — F1010 Alcohol abuse, uncomplicated: Secondary | ICD-10-CM

## 2015-02-11 DIAGNOSIS — R443 Hallucinations, unspecified: Secondary | ICD-10-CM

## 2015-02-11 DIAGNOSIS — R45851 Suicidal ideations: Secondary | ICD-10-CM

## 2015-02-11 DIAGNOSIS — F1410 Cocaine abuse, uncomplicated: Secondary | ICD-10-CM | POA: Diagnosis present

## 2015-02-11 DIAGNOSIS — R0602 Shortness of breath: Secondary | ICD-10-CM

## 2015-02-11 DIAGNOSIS — F1910 Other psychoactive substance abuse, uncomplicated: Secondary | ICD-10-CM

## 2015-02-11 DIAGNOSIS — J302 Other seasonal allergic rhinitis: Secondary | ICD-10-CM

## 2015-02-11 DIAGNOSIS — E119 Type 2 diabetes mellitus without complications: Secondary | ICD-10-CM

## 2015-02-11 DIAGNOSIS — F1994 Other psychoactive substance use, unspecified with psychoactive substance-induced mood disorder: Secondary | ICD-10-CM | POA: Diagnosis not present

## 2015-02-11 LAB — CBC WITH DIFFERENTIAL/PLATELET
Basophils Absolute: 0 10*3/uL (ref 0.0–0.1)
Basophils Relative: 0 % (ref 0–1)
Eosinophils Absolute: 0.1 10*3/uL (ref 0.0–0.7)
Eosinophils Relative: 1 % (ref 0–5)
HCT: 36.5 % (ref 36.0–46.0)
Hemoglobin: 12.4 g/dL (ref 12.0–15.0)
Lymphocytes Relative: 36 % (ref 12–46)
Lymphs Abs: 4.5 10*3/uL — ABNORMAL HIGH (ref 0.7–4.0)
MCH: 31.6 pg (ref 26.0–34.0)
MCHC: 34 g/dL (ref 30.0–36.0)
MCV: 93.1 fL (ref 78.0–100.0)
Monocytes Absolute: 0.7 10*3/uL (ref 0.1–1.0)
Monocytes Relative: 5 % (ref 3–12)
Neutro Abs: 7.1 10*3/uL (ref 1.7–7.7)
Neutrophils Relative %: 58 % (ref 43–77)
Platelets: 391 10*3/uL (ref 150–400)
RBC: 3.92 MIL/uL (ref 3.87–5.11)
RDW: 13.4 % (ref 11.5–15.5)
WBC: 12.4 10*3/uL — ABNORMAL HIGH (ref 4.0–10.5)

## 2015-02-11 LAB — CBG MONITORING, ED
Glucose-Capillary: 141 mg/dL — ABNORMAL HIGH (ref 65–99)
Glucose-Capillary: 181 mg/dL — ABNORMAL HIGH (ref 65–99)
Glucose-Capillary: 165 mg/dL — ABNORMAL HIGH (ref 65–99)

## 2015-02-11 LAB — COMPREHENSIVE METABOLIC PANEL
ALT: 20 U/L (ref 14–54)
AST: 19 U/L (ref 15–41)
Albumin: 4 g/dL (ref 3.5–5.0)
Alkaline Phosphatase: 86 U/L (ref 38–126)
Anion gap: 15 (ref 5–15)
BUN: 6 mg/dL (ref 6–20)
CO2: 18 mmol/L — ABNORMAL LOW (ref 22–32)
Calcium: 9.3 mg/dL (ref 8.9–10.3)
Chloride: 109 mmol/L (ref 101–111)
Creatinine, Ser: 0.61 mg/dL (ref 0.44–1.00)
GFR calc Af Amer: >60 mL/min (ref >60)
GFR calc non Af Amer: >60 mL/min (ref >60)
Glucose, Bld: 158 mg/dL — ABNORMAL HIGH (ref 65–99)
Potassium: 3.4 mmol/L — ABNORMAL LOW (ref 3.5–5.1)
Sodium: 142 mmol/L (ref 135–145)
Total Bilirubin: 0.3 mg/dL (ref 0.3–1.2)
Total Protein: 7.2 g/dL (ref 6.5–8.1)

## 2015-02-11 LAB — SALICYLATE LEVEL: Salicylate Lvl: <4 mg/dL (ref 2.8–30.0)

## 2015-02-11 LAB — RAPID URINE DRUG SCREEN, HOSP PERFORMED
Amphetamines: NOT DETECTED
Barbiturates: NOT DETECTED
Benzodiazepines: NOT DETECTED
Cocaine: POSITIVE — AB
Opiates: NOT DETECTED
Tetrahydrocannabinol: NOT DETECTED

## 2015-02-11 LAB — ACETAMINOPHEN LEVEL: Acetaminophen (Tylenol), Serum: <10 ug/mL — ABNORMAL LOW (ref 10–30)

## 2015-02-11 LAB — ETHANOL: Alcohol, Ethyl (B): 133 mg/dL — ABNORMAL HIGH (ref <5)

## 2015-02-11 MED ORDER — ACETAMINOPHEN 325 MG PO TABS
650.0000 mg | ORAL_TABLET | ORAL | Status: DC | PRN
  Administered 2015-02-13 – 2015-02-14 (×4): 650 mg via ORAL
  Filled 2015-02-11 (×4): qty 2

## 2015-02-11 MED ORDER — LORATADINE 10 MG PO TABS
10.0000 mg | ORAL_TABLET | Freq: Every day | ORAL | Status: DC
  Administered 2015-02-11 – 2015-02-15 (×5): 10 mg via ORAL
  Filled 2015-02-11 (×5): qty 1

## 2015-02-11 MED ORDER — VITAMIN B-1 100 MG PO TABS
100.0000 mg | ORAL_TABLET | Freq: Every day | ORAL | Status: DC
  Administered 2015-02-11 – 2015-02-15 (×5): 100 mg via ORAL
  Filled 2015-02-11 (×5): qty 1

## 2015-02-11 MED ORDER — THIAMINE HCL 100 MG/ML IJ SOLN: 100.0000 mg | Freq: Every day | INTRAMUSCULAR | Status: DC

## 2015-02-11 MED ORDER — ALBUTEROL SULFATE HFA 108 (90 BASE) MCG/ACT IN AERS: 1.0000 | INHALATION_SPRAY | Freq: Four times a day (QID) | RESPIRATORY_TRACT | Status: DC | PRN

## 2015-02-11 MED ORDER — SIMVASTATIN 20 MG PO TABS
20.0000 mg | ORAL_TABLET | Freq: Every day | ORAL | Status: DC
  Administered 2015-02-11 – 2015-02-14 (×4): 20 mg via ORAL
  Filled 2015-02-11 (×6): qty 1

## 2015-02-11 MED ORDER — LORAZEPAM 1 MG PO TABS
0.0000 mg | ORAL_TABLET | Freq: Four times a day (QID) | ORAL | Status: AC
  Filled 2015-02-11 (×2): qty 1

## 2015-02-11 MED ORDER — LISINOPRIL 10 MG PO TABS
10.0000 mg | ORAL_TABLET | Freq: Every day | ORAL | Status: DC
  Administered 2015-02-11 – 2015-02-15 (×5): 10 mg via ORAL
  Filled 2015-02-11 (×5): qty 1

## 2015-02-11 MED ORDER — LORAZEPAM 1 MG PO TABS: 0.0000 mg | ORAL_TABLET | Freq: Two times a day (BID) | ORAL | Status: AC

## 2015-02-11 MED ORDER — INSULIN GLARGINE 100 UNIT/ML ~~LOC~~ SOLN
15.0000 [IU] | Freq: Every day | SUBCUTANEOUS | Status: DC
  Administered 2015-02-11 – 2015-02-14 (×4): 15 [IU] via SUBCUTANEOUS
  Filled 2015-02-11 (×9): qty 0.15

## 2015-02-11 MED ORDER — INSULIN ASPART 100 UNIT/ML ~~LOC~~ SOLN
0.0000 [IU] | Freq: Three times a day (TID) | SUBCUTANEOUS | Status: DC
Start: 2015-02-11 — End: 2015-02-15
  Administered 2015-02-11 – 2015-02-13 (×6): 3 [IU] via SUBCUTANEOUS
  Administered 2015-02-14: 2 [IU] via SUBCUTANEOUS
  Administered 2015-02-15: 3 [IU] via SUBCUTANEOUS
  Administered 2015-02-15: 2 [IU] via SUBCUTANEOUS
  Filled 2015-02-11 (×8): qty 1

## 2015-02-11 MED ORDER — ALUM & MAG HYDROXIDE-SIMETH 200-200-20 MG/5ML PO SUSP: 30.0000 mL | ORAL | Status: DC | PRN

## 2015-02-11 MED ORDER — ZOLPIDEM TARTRATE 5 MG PO TABS
5.0000 mg | ORAL_TABLET | Freq: Every evening | ORAL | Status: DC | PRN
  Administered 2015-02-11 – 2015-02-14 (×4): 5 mg via ORAL
  Filled 2015-02-11 (×5): qty 1

## 2015-02-11 MED ORDER — METFORMIN HCL 500 MG PO TABS
1000.0000 mg | ORAL_TABLET | Freq: Two times a day (BID) | ORAL | Status: DC
  Administered 2015-02-11 – 2015-02-15 (×8): 1000 mg via ORAL
  Filled 2015-02-11 (×10): qty 2

## 2015-02-11 MED ORDER — LORAZEPAM 1 MG PO TABS
1.0000 mg | ORAL_TABLET | Freq: Three times a day (TID) | ORAL | Status: DC | PRN
  Administered 2015-02-11 – 2015-02-14 (×6): 1 mg via ORAL
  Filled 2015-02-11 (×5): qty 1

## 2015-02-11 MED ORDER — ONDANSETRON HCL 4 MG PO TABS
4.0000 mg | ORAL_TABLET | Freq: Three times a day (TID) | ORAL | Status: DC | PRN
  Administered 2015-02-11 – 2015-02-15 (×2): 4 mg via ORAL
  Filled 2015-02-11 (×2): qty 1

## 2015-02-11 MED ORDER — TRIAMCINOLONE ACETONIDE 55 MCG/ACT NA AERO
2.0000 | INHALATION_SPRAY | Freq: Every day | NASAL | Status: DC
  Administered 2015-02-11 – 2015-02-15 (×5): 2 via NASAL
  Filled 2015-02-11: qty 21.6

## 2015-02-11 NOTE — ED Provider Notes (Signed)
CSN: [id number]     Arrival date & time 02/11/15  0540 History   First MD Initiated Contact with Patient 02/11/15 [phone]     Chief Complaint  Patient presents with  . Suicidal     (Consider location/radiation/quality/duration/timing/severity/associated sxs/prior Treatment) HPI Comments: Robin Montgomery is a 54 y.o. female with a PMHx of DM2, HTN, and depression, who presents to the ED with complaints of suicidal ideations with a plan to overdose. She reports that these thoughts have been occurring for the last 1 week, and that last night she took an unknown quantity of an unknown type of his abdomen attempted to overdose, but was not successful. Her family brought her to the ER seeking help for her mental health issues. She admits to drinking alcohol, unsure amount and was 24 hours, as well as taking crack, unknown amount. She also endorses auditory and visual hallucinations, stating that a friend named Judson Roch is sitting with her on the bed. She ran out of her psychiatric medications 2 weeks ago Marcello Moores she is not sure what medication she is post beyond. She states that she previously was seen at [facility], but she has not gone recently. She has no other medical complaints. She is here voluntarily but trying to leave, therefore IVC paperwork was taken out.   Patient is a 54 y.o. female presenting with mental health disorder. The history is provided by the patient. No language interpreter was used.  Mental Health Problem Presenting symptoms: aggressive behavior, depression, hallucinations, suicidal thoughts and suicide attempt   Presenting symptoms: no homicidal ideas   Patient accompanied by:  Family member Degree of incapacity (severity):  Moderate Onset quality:  Gradual Duration:  1 week Timing:  Constant Progression:  Worsening Chronicity:  Recurrent Context: noncompliance   Treatment compliance:  Untreated Time since last psychoactive medication taken:  2 weeks Relieved by:  None  tried Worsened by:  Nothing tried Ineffective treatments:  None tried Associated symptoms: no abdominal pain and no chest pain   Risk factors: hx of mental illness     Past Medical History  Diagnosis Date  . Diabetes mellitus without complication   . Hypertension   . Depression    Past Surgical History  Procedure Laterality Date  . Cholecystectomy     History reviewed. No pertinent family history. Social History  Substance Use Topics  . Smoking status: Current Every Day Smoker  . Smokeless tobacco: None     Comment: states she cut back to 4 cigarettes/per day  . Alcohol Use: Yes     Comment: as much as she can for the last month   OB History    No data available     Review of Systems  Constitutional: Negative for fever and chills.  Respiratory: Negative for shortness of breath.   Cardiovascular: Negative for chest pain.  Gastrointestinal: Negative for nausea, vomiting, abdominal pain, diarrhea and constipation.  Genitourinary: Negative for dysuria and hematuria.  Musculoskeletal: Negative for myalgias and arthralgias.  Skin: Negative for color change.  Allergic/Immunologic: Positive for immunocompromised state (diabetic).  Neurological: Negative for weakness and numbness.  Psychiatric/Behavioral: Positive for suicidal ideas and hallucinations. Negative for homicidal ideas and confusion.   10 Systems reviewed and are negative for acute change except as noted in the HPI.    Allergies  Aspirin  Home Medications   Prior to Admission medications   Medication Sig Start Date End Date Taking? Authorizing Provider  albuterol (PROVENTIL HFA;VENTOLIN HFA) 108 (90 BASE) MCG/ACT inhaler Inhale 1  puff into the lungs every 6 (six) hours as needed for wheezing or shortness of breath. 09/12/14   Josalyn Funches, MD  benztropine (COGENTIN) 2 MG tablet Take 2 mg by mouth at bedtime.     Historical Provider, MD  Blood Glucose Monitoring Suppl (ACCU-CHEK AVIVA PLUS) W/DEVICE KIT 1  Device by Does not apply route 3 (three) times daily after meals. 09/12/14   Josalyn Funches, MD  cetirizine (ZYRTEC) 10 MG tablet Take 1 tablet (10 mg total) by mouth daily. 09/12/14   Boykin Nearing, MD  diclofenac (VOLTAREN) 75 MG EC tablet Take 1 tablet (75 mg total) by mouth 2 (two) times daily as needed. 12/22/14   Josalyn Funches, MD  glucose blood (ACCU-CHEK AVIVA PLUS) test strip 1 each by Other route 3 (three) times daily. 09/12/14   Josalyn Funches, MD  Insulin Glargine (LANTUS SOLOSTAR) 100 UNIT/ML Solostar Pen Inject 15 Units into the skin daily at 10 pm. 12/22/14   Boykin Nearing, MD  Insulin Pen Needle (ULTICARE MICRO PEN NEEDLES) 32G X 4 MM MISC 1 each by Does not apply route at bedtime. 12/22/14   Josalyn Funches, MD  Insulin Syringes, Disposable, U-100 0.5 ML MISC 1 each by Does not apply route at bedtime. 04/18/14   Josalyn Funches, MD  Lancets (ACCU-CHEK SOFT TOUCH) lancets 1 each by Other route 3 (three) times daily. 09/12/14   Josalyn Funches, MD  Lancets Misc. (ACCU-CHEK SOFTCLIX LANCET DEV) KIT 1 each by Does not apply route 3 (three) times daily. 09/12/14   Josalyn Funches, MD  lisinopril (PRINIVIL,ZESTRIL) 10 MG tablet Take 1 tablet (10 mg total) by mouth daily. For blood pressure Patient not taking: Reported on 12/22/2014 03/27/14   Kerrie Buffalo, NP  metFORMIN (GLUCOPHAGE) 1000 MG tablet Take 1 tablet (1,000 mg total) by mouth 2 (two) times daily with a meal. 12/22/14   Boykin Nearing, MD  QUEtiapine (SEROQUEL) 200 MG tablet Take 400 mg by mouth at bedtime.     Historical Provider, MD  sertraline (ZOLOFT) 100 MG tablet Take 1 tablet (100 mg total) by mouth daily. For depression Patient not taking: Reported on 12/22/2014 03/27/14   Kerrie Buffalo, NP  simvastatin (ZOCOR) 20 MG tablet Take 1 tablet (20 mg total) by mouth daily at 6 PM. For high cholesterol Patient not taking: Reported on 12/22/2014 03/27/14   Kerrie Buffalo, NP  terbinafine (LAMISIL) 250 MG tablet Take 1 tablet (250 mg  total) by mouth daily. 09/12/14   Boykin Nearing, MD  traZODone (DESYREL) 100 MG tablet Take 1 tablet (100 mg total) by mouth at bedtime. For depression and insomnia Patient not taking: Reported on 12/22/2014 03/27/14   Kerrie Buffalo, NP  triamcinolone (NASACORT AQ) 55 MCG/ACT AERO nasal inhaler Place 2 sprays into the nose daily. Patient not taking: Reported on 12/22/2014 09/12/14   Josalyn Funches, MD   BP 141/97 mmHg  Pulse 108  Temp(Src) 98.7 F (37.1 C) (Oral)  Resp 17  Ht _0  (1.575 m)  Wt 160 lb (72.576 kg)  BMI 29.26 kg/m2  SpO2 96% Physical Exam  Constitutional: She is oriented to person, place, and time. Vital signs are normal. She appears well-developed and well-nourished.  Non-toxic appearance.  Afebrile, nontoxic, angry, trying to leave  HENT:  Head: Normocephalic and atraumatic.  Mouth/Throat: Oropharynx is clear and moist and mucous membranes are normal.  Eyes: Conjunctivae and EOM are normal. Right eye exhibits no discharge. Left eye exhibits no discharge.  Neck: Normal range of motion. Neck supple.  Cardiovascular:  Regular rhythm, normal heart sounds and intact distal pulses.  Tachycardia present.  Exam reveals no gallop and no friction rub.   No murmur heard. Mild tachycardia.  Pulmonary/Chest: Effort normal and breath sounds normal. No respiratory distress. She has no decreased breath sounds. She has no wheezes. She has no rhonchi. She has no rales.  Abdominal: Soft. Normal appearance and bowel sounds are normal. She exhibits no distension. There is no tenderness. There is no rigidity, no rebound, no guarding, no CVA tenderness, no tenderness at McBurney's point and negative Murphy's sign.  Musculoskeletal: Normal range of motion.  Neurological: She is alert and oriented to person, place, and time. She has normal strength. No sensory deficit.  Skin: Skin is warm, dry and intact. No rash noted.  Psychiatric: Her affect is angry. She is agitated and actively  hallucinating. She expresses suicidal ideation. She expresses no homicidal ideation. She expresses suicidal plans. She expresses no homicidal plans.  +SI with plan to OD. Angry, trying to leave, somewhat agitated with staff. Hallucinating talking to an imaginary friend named Judson Roch. No HI.  Nursing note and vitals reviewed.   ED Course  Procedures (including critical care time) Labs Review Labs Reviewed  COMPREHENSIVE METABOLIC PANEL - Abnormal; Notable for the following:    Potassium 3.4 (*)    CO2 18 (*)    Glucose, Bld 158 (*)    All other components within normal limits  ETHANOL - Abnormal; Notable for the following:    Alcohol, Ethyl (B) 133 (*)    All other components within normal limits  CBC WITH DIFFERENTIAL/PLATELET - Abnormal; Notable for the following:    WBC 12.4 (*)    Lymphs Abs 4.5 (*)    All other components within normal limits  ACETAMINOPHEN LEVEL - Abnormal; Notable for the following:    Acetaminophen (Tylenol), Serum <10 (*)    All other components within normal limits  SALICYLATE LEVEL  URINE RAPID DRUG SCREEN, HOSP PERFORMED    Imaging Review No results found. I have personally reviewed and evaluated these images and lab results as part of my medical decision-making.   EKG Interpretation None      MDM   Final diagnoses:  Suicidal ideation  Hallucinations  Alcohol abuse  Polysubstance abuse    54 y.o. female here with suicidal ideations with a plan to overdose. She states that she took an unknown quantity of pills last night, she's not sure what pills they were, in an attempt to overdose but did not succeed. She endorses alcohol use, unsure how much in the last 24 hours. She also endorses crack use, unknown amount. Actively hallucinating talking to imaginary friend named Judson Roch. Will obtain medical clearance lab work. Once medically cleared, TTS will be consulted. Pt trying to leave, will take out IVC paperwork.   8:40 AM EtOH level 133. Tylenol  and Salicylate level WNL. CMP with mildly elevated gluc at 158, no anion gap. Mildly low K at 3.4, doubt need for repletion. CBC w/diff with mildly elevated WBC but no changes on differential. UDS not yet obtained, but otherwise pt is medically cleared. Will place psych hold orders.  Please see TTS notes for further documentation of care.  BP 142/79 mmHg  Pulse 108  Temp(Src) 98.7 F (37.1 C) (Oral)  Resp 17  Ht _0  (1.575 m)  Wt 160 lb (72.576 kg)  BMI 29.26 kg/m2  SpO2 93%  Meds ordered this encounter  Medications  . LORazepam (ATIVAN) tablet 0-4 mg  Sig:     Order Specific Question:  CIWA-AR < 5 =    Answer:  0    Order Specific Question:  CIWA-AR 5 -10 =    Answer:  1 mg    Order Specific Question:  CIWA-AR 11 -15 =    Answer:  2 mg    Order Specific Question:  CIWA-AR 16 -24 =    Answer:  2 mg    Order Specific Question:  CIWA-AR 16 -24 =    Answer:  Recheck CIWA-AR in 1 hour; if > 15 notify MD    Order Specific Question:  CIWA-AR > 24 =    Answer:  4 mg    Order Specific Question:  CIWA-AR > 24 =    Answer:  Call Rapid Response   Followed by  . LORazepam (ATIVAN) tablet 0-4 mg    Sig:     Order Specific Question:  CIWA-AR < 5 =    Answer:  0    Order Specific Question:  CIWA-AR 5 -10 =    Answer:  1 mg    Order Specific Question:  CIWA-AR 11 -15 =    Answer:  2 mg    Order Specific Question:  CIWA-AR 16 -24 =    Answer:  2 mg    Order Specific Question:  CIWA-AR 16 -24 =    Answer:  Recheck CIWA-AR in 1 hour; if > 15 notify MD    Order Specific Question:  CIWA-AR > 24 =    Answer:  4 mg    Order Specific Question:  CIWA-AR > 24 =    Answer:  Call Rapid Response  . thiamine (VITAMIN B-1) tablet 100 mg    Sig:    Or  . thiamine (B-1) injection 100 mg    Sig:   . alum & mag hydroxide-simeth (MAALOX/MYLANTA) 200-200-20 MG/5ML suspension 30 mL    Sig:   . ondansetron (ZOFRAN) tablet 4 mg    Sig:   . zolpidem (AMBIEN) tablet 5 mg    Sig:   . acetaminophen  (TYLENOL) tablet 650 mg    Sig:   . LORazepam (ATIVAN) tablet 1 mg    Sig:   . albuterol (PROVENTIL HFA;VENTOLIN HFA) 108 (90 BASE) MCG/ACT inhaler 1 puff    Sig:   . loratadine (CLARITIN) tablet 10 mg    Sig:   . Insulin Glargine (LANTUS) Solostar Pen 15 Units    Sig:   . lisinopril (PRINIVIL,ZESTRIL) tablet 10 mg    Sig:   . metFORMIN (GLUCOPHAGE) tablet 1,000 mg    Sig:   . simvastatin (ZOCOR) tablet 20 mg    Sig:   . triamcinolone (NASACORT) nasal inhaler 2 spray    Sig:   . insulin aspart (novoLOG) injection 0-15 Units    Sig:     Order Specific Question:  Correction coverage:    Answer:  Moderate (average weight, post-op)    Order Specific Question:  CBG < 70:    Answer:  implement hypoglycemia protocol    Order Specific Question:  CBG 70 - 120:    Answer:  0 units    Order Specific Question:  CBG 121 - 150:    Answer:  2 units    Order Specific Question:  CBG 151 - 200:    Answer:  3 units    Order Specific Question:  CBG 201 - 250:    Answer:  5 units    Order Specific Question:  CBG 251 - 300:    Answer:  8 units    Order Specific Question:  CBG 301 - 350:    Answer:  11 units    Order Specific Question:  CBG 351 - 400:    Answer:  15 units    Order Specific Question:  CBG > 400    Answer:  call MD and obtain STAT lab verification     Ember Henrikson Camprubi-Soms, PA-C 02/11/15 0848  Mikel Hardgrove Camprubi-Soms, PA-C 02/11/15 0920  Everlene Balls, MD 02/11/15 1711

## 2015-02-11 NOTE — ED Notes (Signed)
The pts dinner tray has arrived 

## 2015-02-11 NOTE — ED Notes (Signed)
Pt watching tv  No complaints

## 2015-02-11 NOTE — ED Notes (Signed)
Bed cleaned .  Clean linen placed on the bed.  New change for the pt

## 2015-02-11 NOTE — ED Notes (Signed)
Waiting on meds from pharmacy.   Notified and spoke with Dennie Bible.

## 2015-02-11 NOTE — ED Notes (Signed)
The opt has a Comptroller.  She wants to take a shower tonight calm  No complaints

## 2015-02-11 NOTE — ED Notes (Signed)
Patient vomiting.   Will hold all PO meds at this time per patient request.  Will give zofran and recheck.

## 2015-02-11 NOTE — ED Notes (Signed)
Patient sleep and does not appear to be in any acute distress; Sitter at bedside

## 2015-02-11 NOTE — ED Notes (Signed)
Sitter at bedside.  The pt reports that she is unsteady on her feet but she usually walks with a cane because she sometimes gets a little unbalanced her cane is not with her

## 2015-02-11 NOTE — ED Notes (Signed)
Belongings locked up in containers and security.

## 2015-02-11 NOTE — ED Notes (Signed)
Sitter at the bedside.

## 2015-02-11 NOTE — [facility] Counselor (Addendum)
[facility] Assessment Progress Note  Per Claudette Head, DNP, pt is to be kept overnight for psychiatry to re-assess in the AM for probable discharge. Counselor advised pt's nurse, Amy, of the disposition.  [name]. Ladona Ridgel, MS, NCC, LPCA Counselor

## 2015-02-11 NOTE — ED Notes (Signed)
Pt states she has been depressed and wants to kill herself. Pt admits to alcohol and drug usage today. Pt also stated that she has been unable to afford her depression medication.

## 2015-02-11 NOTE — [facility] Counselor (Signed)
Pending review for possible placement with [facility].  

## 2015-02-11 NOTE — ED Notes (Signed)
Pt up taking a shiower.  Steady gait no tremors noted.  Sitter at the pts side

## 2015-02-11 NOTE — ED Notes (Signed)
No ativan given ciwa is 0

## 2015-02-11 NOTE — ED Notes (Signed)
Patient states she now has diarrhea and has gone 3 times in the last few hours.  Patient states she is no longer vomiting, but still feels a little nausea.

## 2015-02-11 NOTE — ED Notes (Addendum)
This RN at bedside to assess patient with family at bedside.  Belongings at bedside, asked daughter to provide those to this RN when patient became visibly upset stating "you cannot have or take my purse".  Advised the patient and family that belongings would be at the nurses station and inventoried.  Patient then leaves the room and security notified to be present due to patient becoming more aggressive and attempting to leave the ED.
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
## 2015-02-11 NOTE — ED Notes (Signed)
Pt placed in paper scrubs, security wanded, and staffing called for sitter.

## 2015-02-11 NOTE — ED Notes (Signed)
IVC papers - Original placed in folder for magistrate - copy faxed to [facility] - copy sent to Med Records.

## 2015-02-12 DIAGNOSIS — F1090 Alcohol use, unspecified, uncomplicated: Secondary | ICD-10-CM | POA: Insufficient documentation

## 2015-02-12 DIAGNOSIS — R45851 Suicidal ideations: Secondary | ICD-10-CM

## 2015-02-12 DIAGNOSIS — F1994 Other psychoactive substance use, unspecified with psychoactive substance-induced mood disorder: Secondary | ICD-10-CM

## 2015-02-12 DIAGNOSIS — F332 Major depressive disorder, recurrent severe without psychotic features: Secondary | ICD-10-CM | POA: Diagnosis not present

## 2015-02-12 DIAGNOSIS — F1910 Other psychoactive substance abuse, uncomplicated: Secondary | ICD-10-CM | POA: Insufficient documentation

## 2015-02-12 LAB — CBG MONITORING, ED
Glucose-Capillary: 198 mg/dL — ABNORMAL HIGH (ref 65–99)
Glucose-Capillary: 160 mg/dL — ABNORMAL HIGH (ref 65–99)
Glucose-Capillary: 176 mg/dL — ABNORMAL HIGH (ref 65–99)
Glucose-Capillary: 165 mg/dL — ABNORMAL HIGH (ref 65–99)

## 2015-02-12 MED ORDER — HALOPERIDOL LACTATE 5 MG/ML IJ SOLN: 5.0000 mg | Freq: Once | INTRAMUSCULAR | Status: DC

## 2015-02-12 NOTE — ED Notes (Addendum)
Pt requested something to help her sleep.  RN gave PRN.  Waiting from Lantus from the pharmacy to be delivered.

## 2015-02-12 NOTE — Progress Notes (Signed)
Made referrals to the following facilities with expected psychiatric bed availability today: [facility]  All other facilities contacted are at capacity currently.  Ilean Skill, MSW, LCSW Clinical Social Work, Disposition  02/12/2015 [phone]

## 2015-02-12 NOTE — ED Notes (Signed)
Meal given, pt sitting in bed eating, occasionally laughs at TV.  Pt calm and cooperative w/ no signs of distress at this time.

## 2015-02-12 NOTE — ED Notes (Signed)
Pt in room watching TV w/ safety sitter bedside.  Pt reports she is still feeling suicidal but "maintaining".  Pt withdrawn w/ poor eye contact and few words.  Did request ice which was provided.  Encouraged pt to call for nurse if she needed anything.

## 2015-02-12 NOTE — ED Notes (Signed)
Family member Malachi Bonds called, w/ pt's verbal permission RN gave an update.  She will come and visit tomorrow.  Relayed message to pt.

## 2015-02-12 NOTE — Progress Notes (Addendum)
Patient has been followed up and referred at: [facility] - left voicemail with call back number. [facility] - per intake, fax referral for waitlist. [facility] - per Berenice Bouton, fax referral over. [facility] - per Reuel Boom, fax referral over  Declined at: [facility] - per Britta Mccreedy, due to acuity   Per Britta Mccreedy, the unit is going to be under construction and will be able to accommodate very low acuity semi private bed for one adult female.   Also, geriatric beds (55 and above) open for 2 female and 1 female, no dementia.   At capacity: [facility] - per [employer] [facility] - per Rozann Lesches - per [facility] - per Aimie, may have beds on 9/09 [facility]   CSW will continue to seek placement.  Melbourne Abts, LCSWA Disposition staff 02/12/2015 3:19 PM

## 2015-02-12 NOTE — Consult Note (Signed)
Telepsych Consultation   Reason for Consult:  Suicidal ideation Referring Physician:  EDP Patient Identification: Robin Montgomery MRN:  [id number] Principal Diagnosis: Suicidal ideation Diagnosis:   Patient Active Problem List   Diagnosis Date Noted  . MDD (major depressive disorder), recurrent severe, without psychosis [F33.2] 02/12/2015    Priority: High  . Substance induced mood disorder [F19.94] 02/12/2015    Priority: High  . Suicidal ideation [R45.851] 02/12/2015    Priority: High  . Cocaine abuse [F14.10] 04/01/2014    Priority: High  . Polysubstance abuse [F19.10]   . Wrist pain, chronic [M25.539, G89.29] 12/22/2014  . Trochanteric bursitis of left hip [M70.62] 12/22/2014  . Seasonal allergies [J30.2] 09/12/2014  . Smoker [Z72.0] 09/12/2014  . Shortness of breath [R06.02] 09/12/2014  . Healthcare maintenance [Z00.00] 09/12/2014  . Skin rash [R21] 09/12/2014  . Diabetes mellitus type 2, insulin dependent [E11.9, Z79.4] 04/01/2014  . Pain due to onychomycosis of toenail [B35.1, M79.676] 04/01/2014  . Depression [F32.9] 03/19/2014    Total Time spent with patient: 25 minutes  Subjective:   Robin Montgomery is a 54 y.o. female patient admitted with reports of suicidal ideation following crack-cocaine abuse. Pt seen and chart reviewed. Pt has been a pt at [facility] approximately 1 year ago for similar complaints. Pt affirms suicidal ideation with plan to walk into traffic citing her drug use and daily crack-cocaine and alcohol abuse/addiction as her primary stressor. Pt denies homicidal ideation and psychosis and does not appear to be responding to internal stimuli. Cites poor sleep and poor appetite. Denies good family support system although she reports living "with a few family members". Pt reports not having any psychiatric medications in 2 months and that she was a patient at [facility]. Pt cites being diagnosed with Schizoaffective Bipolar and her shares accounts of manic behaviors  with being awake for 3-4 days (while drug-free) with feelings of high energy and invincibility.  HPI:  I have reviewed and concur with HPI below, modified as follows:  Robin Montgomery is an 54 y.o. female who presents voluntarily to [facility] with c/o SI w/ a plan to overdose. Pt is oriented x 3, not oriented to the time (year, date, or day). She presents with generally irritable mood and expresses a desire to go home. Pt endorses ongoing SI. She says "I don't want to live..I just don't" and "this has been going on all my life". Pt would not specify suicidal intent or current plan. She admits to 3 prior suicide attempts by overdose. Pt denies HI or any hx of violence.  Pt indicates that she came into the hospital b/c she "wasn't feeling good" and was "hearing people talking". Pt reports that the people that are talking are currently in the room with her. She indicates that they don't speak to her or harm her in anyway, but they talk amongst themselves. Pt adds that one of the people, Judson Roch, does talk to her and said that Judson Roch was beside her. Counselor notes there to be no one sitting beside pt or standing around her.   Pt reports not eating or sleeping in a few days. She also admits that she hasn't taken her medications in the last week b/c "I didn't want to". Pt admits to using crack everyday, last use today before admission. Pt also admits to using ETOH everyday for at least a week. BAL is 133. UDS not yet resulted. Pt continues to stress her desire to go home.  Pt spent the night in  the ED without incident. Telepsychiatry consult ordered.   Past Medical History:  Past Medical History  Diagnosis Date  . Diabetes mellitus without complication   . Hypertension   . Depression     Past Surgical History  Procedure Laterality Date  . Cholecystectomy     Family History: History reviewed. No pertinent family history. Social History:  History  Alcohol Use  . Yes    Comment: as much as she can for  the last month     History  Drug Use  . Yes  . Special: Cocaine    Comment: crack    Social History   Social History  . Marital Status: Single    Spouse Name: N/A  . Number of Children: N/A  . Years of Education: N/A   Social History Main Topics  . Smoking status: Current Every Day Smoker  . Smokeless tobacco: None     Comment: states she cut back to 4 cigarettes/per day  . Alcohol Use: Yes     Comment: as much as she can for the last month  . Drug Use: Yes    Special: Cocaine     Comment: crack  . Sexual Activity: Not Asked   Other Topics Concern  . None   Social History Narrative   Additional Social History:    Pain Medications: see MAR Prescriptions: see MAR Over the Counter: see MAR History of alcohol / drug use?: Yes Longest period of sobriety (when/how long): unknown Negative Consequences of Use:  (unspecified) Withdrawal Symptoms:  (unknown, pt reports not staying clean long enough to know) Name of Substance 1: Crack 1 - Age of First Use: mid 30s 1 - Amount (size/oz): unknown 1 - Frequency: every day 1 - Duration: unknown 1 - Last Use / Amount: before hospital admission Name of Substance 2: ETOH 2 - Age of First Use: unknown 2 - Amount (size/oz): unknown 2 - Frequency: every day 2 - Duration: a week 2 - Last Use / Amount: before hospital admission                 Allergies:   Allergies  Allergen Reactions  . Aspirin Nausea And Vomiting    Upset stomach    Labs:  Results for orders placed or performed during the hospital encounter of 02/11/15 (from the past 48 hour(s))  Comprehensive metabolic panel     Status: Abnormal   Collection Time: 02/11/15  7:52 AM  Result Value Ref Range   Sodium 142 135 - 145 mmol/L   Potassium 3.4 (L) 3.5 - 5.1 mmol/L   Chloride 109 101 - 111 mmol/L   CO2 18 (L) 22 - 32 mmol/L   Glucose, Bld 158 (H) 65 - 99 mg/dL   BUN 6 6 - 20 mg/dL   Creatinine, Ser 0.61 0.44 - 1.00 mg/dL   Calcium 9.3 8.9 - 10.3 mg/dL    Total Protein 7.2 6.5 - 8.1 g/dL   Albumin 4.0 3.5 - 5.0 g/dL   AST 19 15 - 41 U/L   ALT 20 14 - 54 U/L   Alkaline Phosphatase 86 38 - 126 U/L   Total Bilirubin 0.3 0.3 - 1.2 mg/dL   GFR calc non Af Amer >60 >60 mL/min   GFR calc Af Amer >60 >60 mL/min    Comment: (NOTE) The eGFR has been calculated using the CKD EPI equation. This calculation has not been validated in all clinical situations. eGFR's persistently <60 mL/min signify possible Chronic Kidney Disease.  Anion gap 15 5 - 15  CBC with Diff     Status: Abnormal   Collection Time: 02/11/15  7:52 AM  Result Value Ref Range   WBC 12.4 (H) 4.0 - 10.5 K/uL   RBC 3.92 3.87 - 5.11 MIL/uL   Hemoglobin 12.4 12.0 - 15.0 g/dL   HCT 36.5 36.0 - 46.0 %   MCV 93.1 78.0 - 100.0 fL   MCH 31.6 26.0 - 34.0 pg   MCHC 34.0 30.0 - 36.0 g/dL   RDW 13.4 11.5 - 15.5 %   Platelets 391 150 - 400 K/uL   Neutrophils Relative % 58 43 - 77 %   Neutro Abs 7.1 1.7 - 7.7 K/uL   Lymphocytes Relative 36 12 - 46 %   Lymphs Abs 4.5 (H) 0.7 - 4.0 K/uL   Monocytes Relative 5 3 - 12 %   Monocytes Absolute 0.7 0.1 - 1.0 K/uL   Eosinophils Relative 1 0 - 5 %   Eosinophils Absolute 0.1 0.0 - 0.7 K/uL   Basophils Relative 0 0 - 1 %   Basophils Absolute 0.0 0.0 - 0.1 K/uL  Ethanol     Status: Abnormal   Collection Time: 02/11/15  7:53 AM  Result Value Ref Range   Alcohol, Ethyl (B) 133 (H) <5 mg/dL    Comment:        LOWEST DETECTABLE LIMIT FOR SERUM ALCOHOL IS 5 mg/dL FOR MEDICAL PURPOSES ONLY   Acetaminophen level     Status: Abnormal   Collection Time: 02/11/15  7:53 AM  Result Value Ref Range   Acetaminophen (Tylenol), Serum <10 (L) 10 - 30 ug/mL    Comment:        THERAPEUTIC CONCENTRATIONS VARY SIGNIFICANTLY. A RANGE OF 10-30 ug/mL MAY BE AN EFFECTIVE CONCENTRATION FOR MANY PATIENTS. HOWEVER, SOME ARE BEST TREATED AT CONCENTRATIONS OUTSIDE THIS RANGE. ACETAMINOPHEN CONCENTRATIONS >150 ug/mL AT 4 HOURS AFTER INGESTION AND >50 ug/mL  AT 12 HOURS AFTER INGESTION ARE OFTEN ASSOCIATED WITH TOXIC REACTIONS.   Salicylate level     Status: None   Collection Time: 02/11/15  7:53 AM  Result Value Ref Range   Salicylate Lvl <1.0 2.8 - 30.0 mg/dL  Urine rapid drug screen (hosp performed)not at [facility]     Status: Abnormal   Collection Time: 02/11/15  9:19 AM  Result Value Ref Range   Opiates NONE DETECTED NONE DETECTED   Cocaine POSITIVE (A) NONE DETECTED   Benzodiazepines NONE DETECTED NONE DETECTED   Amphetamines NONE DETECTED NONE DETECTED   Tetrahydrocannabinol NONE DETECTED NONE DETECTED   Barbiturates NONE DETECTED NONE DETECTED    Comment:        DRUG SCREEN FOR MEDICAL PURPOSES ONLY.  IF CONFIRMATION IS NEEDED FOR ANY PURPOSE, NOTIFY LAB WITHIN 5 DAYS.        LOWEST DETECTABLE LIMITS FOR URINE DRUG SCREEN Drug Class       Cutoff (ng/mL) Amphetamine      1000 Barbiturate      200 Benzodiazepine   272 Tricyclics       536 Opiates          300 Cocaine          300 THC              50   CBG monitoring, ED     Status: Abnormal   Collection Time: 02/11/15 11:55 AM  Result Value Ref Range   Glucose-Capillary 141 (H) 65 - 99 mg/dL   Comment 1 Notify  RN    Comment 2 Document in Chart   CBG monitoring, ED     Status: Abnormal   Collection Time: 02/11/15  5:26 PM  Result Value Ref Range   Glucose-Capillary 181 (H) 65 - 99 mg/dL   Comment 1 Notify RN    Comment 2 Document in Chart   CBG monitoring, ED     Status: Abnormal   Collection Time: 02/11/15  9:48 PM  Result Value Ref Range   Glucose-Capillary 165 (H) 65 - 99 mg/dL   Comment 1 Notify RN    Comment 2 Document in Chart   CBG monitoring, ED     Status: Abnormal   Collection Time: 02/12/15  7:17 AM  Result Value Ref Range   Glucose-Capillary 198 (H) 65 - 99 mg/dL  CBG monitoring, ED     Status: Abnormal   Collection Time: 02/12/15  1:16 PM  Result Value Ref Range   Glucose-Capillary 160 (H) 65 - 99 mg/dL    Vitals: Blood pressure 118/53, pulse  83, temperature 98.5 F (36.9 C), temperature source Oral, resp. rate 12, height '5\' 2"'  (1.575 m), weight 72.576 kg (160 lb), SpO2 96 %.  Risk to Self: Suicidal Ideation: Yes-Currently Present Suicidal Intent: Yes-Currently Present Is patient at risk for suicide?: Yes Suicidal Plan?: No-Not Currently/Within Last 6 Months Access to Means: Yes Specify Access to Suicidal Means: access to rx medications What has been your use of drugs/alcohol within the last 12 months?: hx of use How many times?: 3 Other Self Harm Risks: 0 Triggers for Past Attempts: Unknown, Unpredictable Intentional Self Injurious Behavior: None Risk to Others: Homicidal Ideation: No Thoughts of Harm to Others: No Current Homicidal Intent: No Current Homicidal Plan: No Access to Homicidal Means: No History of harm to others?: No Assessment of Violence: None Noted Does patient have access to weapons?: No Criminal Charges Pending?: No Does patient have a court date: No Prior Inpatient Therapy: Prior Inpatient Therapy: Yes Prior Therapy Dates: 2015 Prior Therapy Facilty/Provider(s): [facility] Reason for Treatment: polysubstance use disorder Prior Outpatient Therapy: Prior Outpatient Therapy: Yes Prior Therapy Dates: 2016 Prior Therapy Facilty/Provider(s): [facility] Reason for Treatment: bipolar, schizophrenia Does patient have an ACCT team?: No Does patient have Intensive In-House Services?  : No Does patient have [facility] services? : No Does patient have P4CC services?: No  Current Facility-Administered Medications  Medication Dose Route Frequency Provider Last Rate Last Dose  . acetaminophen (TYLENOL) tablet 650 mg  650 mg Oral Q4H PRN Mercedes Camprubi-Soms, PA-C      . albuterol (PROVENTIL HFA;VENTOLIN HFA) 108 (90 BASE) MCG/ACT inhaler 1 puff  1 puff Inhalation Q6H PRN Mercedes Camprubi-Soms, PA-C      . alum & mag hydroxide-simeth (MAALOX/MYLANTA) 200-200-20 MG/5ML suspension 30 mL  30 mL Oral PRN Mercedes  Camprubi-Soms, PA-C      . insulin aspart (novoLOG) injection 0-15 Units  0-15 Units Subcutaneous TID WC Mercedes Camprubi-Soms, PA-C   3 Units at 02/12/15 1324  . insulin glargine (LANTUS) injection 15 Units  15 Units Subcutaneous Q2200 Mercedes Camprubi-Soms, PA-C   15 Units at 02/11/15 2231  . lisinopril (PRINIVIL,ZESTRIL) tablet 10 mg  10 mg Oral Daily Mercedes Camprubi-Soms, PA-C   10 mg at 02/12/15 1029  . loratadine (CLARITIN) tablet 10 mg  10 mg Oral Daily Mercedes Camprubi-Soms, PA-C   10 mg at 02/12/15 1029  . LORazepam (ATIVAN) tablet 0-4 mg  0-4 mg Oral 4 times per day Mercedes Camprubi-Soms, PA-C   Stopped at 02/12/15 1318  Followed by  . [START ON 02/13/2015] LORazepam (ATIVAN) tablet 0-4 mg  0-4 mg Oral Q12H Mercedes Camprubi-Soms, PA-C      . LORazepam (ATIVAN) tablet 1 mg  1 mg Oral Q8H PRN Mercedes Camprubi-Soms, PA-C   1 mg at 02/12/15 0526  . metFORMIN (GLUCOPHAGE) tablet 1,000 mg  1,000 mg Oral BID WC Mercedes Camprubi-Soms, PA-C   1,000 mg at 02/12/15 0723  . ondansetron (ZOFRAN) tablet 4 mg  4 mg Oral Q8H PRN Mercedes Camprubi-Soms, PA-C   4 mg at 02/11/15 1041  . simvastatin (ZOCOR) tablet 20 mg  20 mg Oral q1800 Mercedes Camprubi-Soms, PA-C   20 mg at 02/11/15 1805  . thiamine (VITAMIN B-1) tablet 100 mg  100 mg Oral Daily Mercedes Camprubi-Soms, PA-C   100 mg at 02/12/15 1029  . triamcinolone (NASACORT) nasal inhaler 2 spray  2 spray Nasal Daily Mercedes Camprubi-Soms, PA-C   2 spray at 02/12/15 1031  . zolpidem (AMBIEN) tablet 5 mg  5 mg Oral QHS PRN Mercedes Camprubi-Soms, PA-C   5 mg at 02/11/15 2231   Current Outpatient Prescriptions  Medication Sig Dispense Refill  . albuterol (PROVENTIL HFA;VENTOLIN HFA) 108 (90 BASE) MCG/ACT inhaler Inhale 1 puff into the lungs every 6 (six) hours as needed for wheezing or shortness of breath. 6.7 g 6  . cetirizine (ZYRTEC) 10 MG tablet Take 1 tablet (10 mg total) by mouth daily. 30 tablet 11  . diclofenac (VOLTAREN) 75 MG EC  tablet Take 1 tablet (75 mg total) by mouth 2 (two) times daily as needed. 60 tablet 2  . Insulin Glargine (LANTUS SOLOSTAR) 100 UNIT/ML Solostar Pen Inject 15 Units into the skin daily at 10 pm. 5 pen 3  . lisinopril (PRINIVIL,ZESTRIL) 10 MG tablet Take 1 tablet (10 mg total) by mouth daily. For blood pressure 30 tablet 0  . metFORMIN (GLUCOPHAGE) 1000 MG tablet Take 1 tablet (1,000 mg total) by mouth 2 (two) times daily with a meal. 60 tablet 11  . simvastatin (ZOCOR) 20 MG tablet Take 1 tablet (20 mg total) by mouth daily at 6 PM. For high cholesterol 30 tablet 0  . traZODone (DESYREL) 100 MG tablet Take 1 tablet (100 mg total) by mouth at bedtime. For depression and insomnia 30 tablet 0  . benztropine (COGENTIN) 2 MG tablet Take 2 mg by mouth at bedtime.     . Blood Glucose Monitoring Suppl (ACCU-CHEK AVIVA PLUS) W/DEVICE KIT 1 Device by Does not apply route 3 (three) times daily after meals. 1 kit 0  . glucose blood (ACCU-CHEK AVIVA PLUS) test strip 1 each by Other route 3 (three) times daily. 100 each 12  . Insulin Pen Needle (ULTICARE MICRO PEN NEEDLES) 32G X 4 MM MISC 1 each by Does not apply route at bedtime. 100 each 11  . Insulin Syringes, Disposable, U-100 0.5 ML MISC 1 each by Does not apply route at bedtime. 100 each 11  . Lancets (ACCU-CHEK SOFT TOUCH) lancets 1 each by Other route 3 (three) times daily. 100 each 12  . Lancets Misc. (ACCU-CHEK SOFTCLIX LANCET DEV) KIT 1 each by Does not apply route 3 (three) times daily. 1 kit 0  . QUEtiapine (SEROQUEL) 200 MG tablet Take 400 mg by mouth at bedtime.     . sertraline (ZOLOFT) 100 MG tablet Take 1 tablet (100 mg total) by mouth daily. For depression (Patient not taking: Reported on 12/22/2014) 30 tablet 0  . terbinafine (LAMISIL) 250 MG tablet Take 1 tablet (250 mg  total) by mouth daily. (Patient not taking: Reported on 02/11/2015) 30 tablet 2  . triamcinolone (NASACORT AQ) 55 MCG/ACT AERO nasal inhaler Place 2 sprays into the nose daily.  (Patient not taking: Reported on 12/22/2014) 1 Inhaler 12    Musculoskeletal: UTO, camera  Psychiatric Specialty Exam: Physical Exam  Review of Systems  Psychiatric/Behavioral: Positive for depression, suicidal ideas and substance abuse. The patient is nervous/anxious and has insomnia.   All other systems reviewed and are negative.   Blood pressure 118/53, pulse 83, temperature 98.5 F (36.9 C), temperature source Oral, resp. rate 12, height '5\' 2"'  (1.575 m), weight 72.576 kg (160 lb), SpO2 96 %.Body mass index is 29.26 kg/(m^2).  General Appearance: Casual and Fairly Groomed  Engineer, water::  Good  Speech:  Clear and Coherent and Normal Rate  Volume:  Decreased  Mood:  Depressed  Affect:  Congruent and Depressed  Thought Process:  Circumstantial  Orientation:  Full (Time, Place, and Person)  Thought Content:  Rumination  Suicidal Thoughts:  Yes.  with intent/plan  Homicidal Thoughts:  No  Memory:  Immediate;   Fair Recent;   Fair Remote;   Fair  Judgement:  Fair  Insight:  Fair  Psychomotor Activity:  Decreased  Concentration:  Fair  Recall:  [employer] [place]  Language: Fair  Akathisia:  No  Handed:    AIMS (if indicated):     Assets:  Desire for Improvement Resilience Social Support  ADL's:  Intact  Cognition: WNL  Sleep:      Medical Decision Making: Review of Psycho-Social Stressors (1), Review or order clinical lab tests (1), Established Problem, Worsening (2), Review of Medication Regimen & Side Effects (2) and Review of New Medication or Change in Dosage (2)  Treatment Plan Summary: Daily contact with patient to assess and evaluate symptoms and progress in treatment and Medication management  Plan:  Recommend psychiatric Inpatient admission when medically cleared.  Disposition:  -Admit to inpatient psychiatric hospital for safety and stabilization  Benjamine Mola, FNP-BC 02/12/2015 9:17 AM   Case discussed with me as above  Neita Garnet,  MD

## 2015-02-13 LAB — CBG MONITORING, ED
Glucose-Capillary: 154 mg/dL — ABNORMAL HIGH (ref 65–99)
Glucose-Capillary: 175 mg/dL — ABNORMAL HIGH (ref 65–99)
Glucose-Capillary: 115 mg/dL — ABNORMAL HIGH (ref 65–99)
Glucose-Capillary: 196 mg/dL — ABNORMAL HIGH (ref 65–99)
Glucose-Capillary: 110 mg/dL — ABNORMAL HIGH (ref 65–99)

## 2015-02-13 MED ORDER — NICOTINE 7 MG/24HR TD PT24
7.0000 mg | MEDICATED_PATCH | Freq: Once | TRANSDERMAL | Status: AC
  Administered 2015-02-13: 7 mg via TRANSDERMAL
  Filled 2015-02-13: qty 1

## 2015-02-13 NOTE — ED Notes (Signed)
According tio her ciwa she does nit need any ativan  But the pt is requesting ativan

## 2015-02-13 NOTE — ED Notes (Signed)
The pt   Has some diarrhea she has been to br once

## 2015-02-13 NOTE — BHH Counselor (Signed)
Reassessment: Did not TA pt as nurse reported that pt did not sleep much last night and has not been sleeping today during the day and is now resting/sleeping. Per nurse, Robin Montgomery, who worked with pt 16 hrs yesterday, pt is eating well, not sleeping much (about 2 hours total since yesterday despite being given Ambien and Ativan).  Nurse reported that pt continues to make comments about killing herself and will not contract for safety.    Note: Nurse reported that pt has been fully ambulatory during her IP stay not needing to use her cane at all.   Beryle Flock, MS, CRC, [facility] Triage Specialist [facility]

## 2015-02-13 NOTE — ED Notes (Signed)
Pt asking foir med for rt hip pain

## 2015-02-13 NOTE — ED Notes (Signed)
Patient didn't want breakfast tray that was ordered, request something different, another order was made for patient.

## 2015-02-13 NOTE — ED Notes (Signed)
ambien scanned but not given.  Returned to pyxis.  Should have hit cancel but hit accept by mistake

## 2015-02-13 NOTE — ED Notes (Signed)
The pt has been up to br.  Eating fruit.  No complaints.  Asking if she is going home today.. She knew the answer.  i told her waiting for placement.

## 2015-02-13 NOTE — ED Notes (Signed)
Called dietary to check on patient breakfast tray, I was told it haven't left kitchen yet, but will be here soon.

## 2015-02-13 NOTE — ED Notes (Signed)
Awaiting pt breakfast tray to give daily insulin dose.

## 2015-02-13 NOTE — Progress Notes (Signed)
Followed up on inpatient placement efforts:  Referred to: [facility]- per Tamela Oddi- refax [facility]- per Rosalita Chessman, unlikely there will be enough d/cs to accommodate outside referrals, but fax in case of changes [facility]- per Benn Moulder- per [facility]- per Jerilynn Som, will review pending having discharges on unit  At capacity: [facility] Left voicemail with [facility] inquiring as to status of referral  Declined: [facility]  Both due to acuity on unit being too high currently  Ilean Skill, MSW, LCSW Clinical Social Work, Disposition  02/13/2015 [phone]

## 2015-02-13 NOTE — Progress Notes (Addendum)
Referral was followed up at: [facility]- per Jerilynn Som, at capacity tonight, but check back in am. [facility]- per Cala Bradford- refax, referral not found. [facility] - spoke with Dennie Bible who stated that am shift will be able to look at referral, check back in am.  At capacity: [facility]  Declined: [facility] Both due to acuity on unit being too high currently  CSW will continue to seek placement.  Melbourne Abts, LCSWA Disposition staff 02/13/2015 10:25 PM

## 2015-02-13 NOTE — ED Notes (Addendum)
Pt was resting but not sleeping when RN arrived.  She continues to report thoughts of harming herself and auditory hallucinations.  She did request an update on her status.  She expresses frustration over not being able to sleep as she did not sleep during the day and she only slept 2-3 hours last night despite medication.  Her appetite has improved since she arrived.  She did request to shower stating that she feels dirty as she reports she has been "sweating a lot due to menapause".  It was noted that in the past that she has trouble ambulating w/o the assistance of a [place] or cane however she has not had either and has ambulated well in her room, to the bathroom including shower.

## 2015-02-13 NOTE — ED Notes (Signed)
Patient daughter called and patient stated she did not want to talk to her.

## 2015-02-13 NOTE — ED Notes (Signed)
Breakfast delivered  

## 2015-02-14 LAB — CBG MONITORING, ED
Glucose-Capillary: 139 mg/dL — ABNORMAL HIGH (ref 65–99)
Glucose-Capillary: 100 mg/dL — ABNORMAL HIGH (ref 65–99)
Glucose-Capillary: 109 mg/dL — ABNORMAL HIGH (ref 65–99)
Glucose-Capillary: 108 mg/dL — ABNORMAL HIGH (ref 65–99)

## 2015-02-14 MED ORDER — LOPERAMIDE HCL 2 MG PO CAPS
4.0000 mg | ORAL_CAPSULE | Freq: Once | ORAL | Status: AC
  Administered 2015-02-14: 4 mg via ORAL
  Filled 2015-02-14: qty 2

## 2015-02-14 MED ORDER — NICOTINE 7 MG/24HR TD PT24
7.0000 mg | MEDICATED_PATCH | Freq: Every day | TRANSDERMAL | Status: DC
  Administered 2015-02-14 – 2015-02-15 (×2): 7 mg via TRANSDERMAL
  Filled 2015-02-14 (×2): qty 1

## 2015-02-14 NOTE — ED Notes (Signed)
Pt on phone at nurses' desk talking w/family member.

## 2015-02-14 NOTE — ED Notes (Signed)
Pt with c/o mild anxiety; prn med given as ordered.

## 2015-02-14 NOTE — ED Notes (Signed)
Received phone call from  [facility], requesting, as per her Charge RN, to document pt is ambulatory w/no appliance - Schult, cane. Advised Joy, will do and that it has been documented throughout this shift - pt is independently ambulatory w/steady gait noted.

## 2015-02-14 NOTE — ED Notes (Signed)
Pt noted to be ambulating in hallway - advised pt, as per [facility] C policy, unable to do so. Pt stated she is experiencing bil feet cramping then returned to room as asked.

## 2015-02-14 NOTE — ED Notes (Signed)
PT FULLY INDEPENDENT W/ALL ADL'S - AMBULATES INDEPENDENTLY W/STEADY GAIT NOTED - NO Ek OR CANE BEING USED.

## 2015-02-14 NOTE — ED Notes (Signed)
Patient arrived to unit with no s/s of distress noted at this time. Pt denies SI or plans to harm herself at this time. Pt pleasant and cooperative with staff. No requests currently. Respirations regular and unlabored, skin warm and dry. Pt ambulating without difficulty.

## 2015-02-14 NOTE — ED Notes (Signed)
Patient taking shower. New scrubs and socks supplied to patient.

## 2015-02-14 NOTE — ED Notes (Signed)
Dr Madilyn Hook to call [facility] EDP to give report so pt may be transported there to Psych ED. Approved by Randye Lobo, Charge RN and Allena Earing, RN, [facility] ED.

## 2015-02-14 NOTE — ED Notes (Signed)
Pt ambulatory to nurses' desk and called daughter to notify of being transported to [facility] ED. Dr Madilyn Hook assessed pt.

## 2015-02-14 NOTE — ED Notes (Signed)
Julieanne Cotton, [facility], aware pt being transported to [facility] ED.

## 2015-02-14 NOTE — ED Notes (Signed)
Opened chart to review for ? Transfer to [facility]

## 2015-02-14 NOTE — BHH Counselor (Signed)
02/14/15 referral packet submitted at 10:21p.m. to [place], Abran Cantor, [place], [place], and [place] K. Tiburcio Pea, MS  Counselor 02/14/2015 10:24 PM

## 2015-02-14 NOTE — ED Notes (Signed)
Bed: WA32 Expected date:  Expected time:  Means of arrival:  Comments: 

## 2015-02-14 NOTE — ED Notes (Addendum)
Dr Madilyn Hook aware x 2 of need to call [facility] EDP. Advised pt requesting med for loose stool - order received for Imodium  po x 1 now.

## 2015-02-14 NOTE — Progress Notes (Signed)
Followed up on inpatient placement efforts:  Referred to: [facility]- per Selinda Michaels- per [facility], unsure of bed status but will have intake RN LaDonna call back as referral was sent 9/9- she will advise as to status of referral and bed availability  At capacity: [facility]  Declined: [facility] [place]  Both due to acuity- too high to manage on unit  Ilean Skill, MSW, LCSW Clinical Social Work, Disposition  02/14/2015 [phone]

## 2015-02-14 NOTE — BH Assessment (Signed)
Pt is cooperative during teleassessment. She is wearing purple scrubs and a blue shower cap. Pt reports she only slept approx 2 hours last night. She says she hasn't been sleeping well in the ED. She says that she her appetite is good this am. Pt endorses SI. She reports AH of "voices" which tell her "to hurt myself". She says the voices tell her to cut herself. Pt denies HI. Pt reports she sees her "friend Velna Hatchet" in the hospital room, but there is no one else present in the room. She says she would prefer to go to [facility] rather than another inpatient facility. Pt reports the highest grade she completed in school was 10th.   Evette Cristal, [place] Therapeutic Triage Specialist

## 2015-02-14 NOTE — ED Notes (Signed)
TTS being performed.  

## 2015-02-14 NOTE — ED Notes (Signed)
CHECKED CBG 109

## 2015-02-15 DIAGNOSIS — F1010 Alcohol abuse, uncomplicated: Secondary | ICD-10-CM | POA: Insufficient documentation

## 2015-02-15 DIAGNOSIS — F1994 Other psychoactive substance use, unspecified with psychoactive substance-induced mood disorder: Secondary | ICD-10-CM | POA: Diagnosis not present

## 2015-02-15 DIAGNOSIS — F332 Major depressive disorder, recurrent severe without psychotic features: Secondary | ICD-10-CM | POA: Diagnosis not present

## 2015-02-15 LAB — CBG MONITORING, ED
Glucose-Capillary: 151 mg/dL — ABNORMAL HIGH (ref 65–99)
Glucose-Capillary: 147 mg/dL — ABNORMAL HIGH (ref 65–99)

## 2015-02-15 MED ORDER — ALBUTEROL SULFATE HFA 108 (90 BASE) MCG/ACT IN AERS
1.0000 | INHALATION_SPRAY | Freq: Four times a day (QID) | RESPIRATORY_TRACT | Status: DC | PRN
Start: 2015-02-15 — End: 2015-10-09

## 2015-02-15 MED ORDER — METFORMIN HCL 1000 MG PO TABS: 1000.0000 mg | ORAL_TABLET | Freq: Two times a day (BID) | ORAL | Status: DC

## 2015-02-15 MED ORDER — FLUTICASONE PROPIONATE 50 MCG/ACT NA SUSP
2.0000 | Freq: Every day | NASAL | Status: DC
  Administered 2015-02-15: 2 via NASAL
  Filled 2015-02-15: qty 16

## 2015-02-15 MED ORDER — LISINOPRIL 10 MG PO TABS: 10.0000 mg | ORAL_TABLET | Freq: Every day | ORAL | Status: DC

## 2015-02-15 MED ORDER — INSULIN PEN NEEDLE 32G X 4 MM MISC: 1.0000 | Freq: Every day | Status: DC

## 2015-02-15 MED ORDER — SIMVASTATIN 20 MG PO TABS: 20.0000 mg | ORAL_TABLET | Freq: Every day | ORAL | Status: DC

## 2015-02-15 MED ORDER — CETIRIZINE HCL 10 MG PO TABS: 10.0000 mg | ORAL_TABLET | Freq: Every day | ORAL | Status: DC

## 2015-02-15 MED ORDER — FLUTICASONE PROPIONATE 50 MCG/ACT NA SUSP: 2.0000 | Freq: Every day | NASAL | Status: DC

## 2015-02-15 MED ORDER — INSULIN GLARGINE 100 UNIT/ML SOLOSTAR PEN: 15.0000 [IU] | PEN_INJECTOR | Freq: Every day | SUBCUTANEOUS | Status: DC

## 2015-02-15 NOTE — ED Notes (Signed)
Patient with complaints of mild nausea, prn Zofran given as ordered.

## 2015-02-15 NOTE — ED Notes (Signed)
Pt sleeping; no s/s of nausea noted.

## 2015-02-15 NOTE — BHH Counselor (Signed)
Psychiatrist recommend discharge at this time. Counselor spoke with patient about discharge. Patient states that she feels safe going home and will follow up with [facility] tomorrow. Patient states that she is a patient at [facility] and does not need further instructions to follow up. Patient requested to be discharged with medications instead of prescriptions. Informed patient that generally prescriptions are provided upon discharge. Informed provider of patient request. Patient states that she does not have any other questions or concerns at this time.   Davina Poke, LCSW Therapeutic Triage Specialist [facility] 02/15/2015 12:55 PM'

## 2015-02-15 NOTE — BHH Suicide Risk Assessment (Signed)
Suicide Risk Assessment   Demographic Factors:  Divorced or widowed, Low socioeconomic status and Unemployed  Total Time spent with patient: 25 minutes  Psychiatric Specialty Exam: Physical Exam  ROS  Blood pressure 115/66, pulse 76, temperature 97.7 F (36.5 C), temperature source Oral, resp. rate 16, height  (1.575 m), weight 72.576 kg (160 lb), SpO2 100 %.Body mass index is 29.26 kg/(m^2).  General Appearance: Casual  Eye Contact::  Fair  Speech:  Clear and Coherent  Volume:  Normal  Mood:  Euthymic  Affect:  Appropriate  Thought Process:  Coherent  Orientation:  Full (Time, Place, and Person)  Thought Content:  WDL  Suicidal Thoughts:  No  Homicidal Thoughts:  No  Memory:  Immediate;   Fair Recent;   Fair Remote;   Fair  Judgement:  Fair  Insight:  Fair  Psychomotor Activity:  Normal  Concentration:  Fair  Recall:  [employer] of Knowledge:Fair  Language: Good  Akathisia:  No  Handed:  Right  AIMS (if indicated):     Assets:  Communication Skills Desire for Improvement Housing Social Support  Sleep:       Musculoskeletal: Strength & Muscle Tone: within normal limits Gait & Station: [employer] with a cane Patient leans: N/A   Mental Status Per Nursing Assessment::    Current Mental Status by Physician: denies SI/HI/AH/VH  Loss Factors: Decrease in vocational status and Decline in physical health  Historical Factors: Prior suicide attempts and Impulsivity  Risk Reduction Factors:   Sense of responsibility to family and Positive social support  Continued Clinical Symptoms:  Alcohol/Substance Abuse/Dependencies Previous Psychiatric Diagnoses and Treatments  Cognitive Features That Contribute To Risk:  na  Suicide Risk:  Minimal: No identifiable suicidal ideation.  Patients presenting with no risk factors but with morbid ruminations; may be classified as minimal risk based on the severity of the depressive symptoms  Discharge Diagnoses:  Substance induced mood disorder    Primary Psychiatric Diagnosis:  Substance induced mood disorder  Secondary Psychiatric Diagnosis:  Cocaine use disorder,severe  Substance (alcohol,cocaine) induced depressive disorder   Non Psychiatric Diagnosis:  DM  HTN    Past Medical History  Diagnosis Date  . Diabetes mellitus without complication   . Hypertension   . Depression     Plan Of Care/Follow-up recommendations:  Activity:  no restrictions  Is patient on multiple antipsychotic therapies at discharge:  No   Has Patient had three or more failed trials of antipsychotic monotherapy by history:  No  Recommended Plan for Multiple Antipsychotic Therapies: NA   Beau Fanny, FNP-BC 02/15/2015, 2:28 PM

## 2015-02-15 NOTE — Consult Note (Signed)
[facility] Face-to-face Consultation   Reason for Consult:  Suicidal ideation Referring Physician:  EDP Patient Identification: ASIANA BENNINGER MRN:  [id number] Principal Diagnosis: Substance induced mood disorder Diagnosis:   Patient Active Problem List   Diagnosis Date Noted  . MDD (major depressive disorder), recurrent severe, without psychosis [F33.2] 02/12/2015    Priority: High  . Substance induced mood disorder [F19.94] 02/12/2015    Priority: High  . Cocaine abuse [F14.10] 04/01/2014    Priority: High  . Alcohol abuse [F10.10]   . Polysubstance abuse [F19.10]   . Wrist pain, chronic [M25.539, G89.29] 12/22/2014  . Trochanteric bursitis of left hip [M70.62] 12/22/2014  . Seasonal allergies [J30.2] 09/12/2014  . Smoker [Z72.0] 09/12/2014  . Shortness of breath [R06.02] 09/12/2014  . Healthcare maintenance [Z00.00] 09/12/2014  . Skin rash [R21] 09/12/2014  . Diabetes mellitus type 2, insulin dependent [E11.9, Z79.4] 04/01/2014  . Pain due to onychomycosis of toenail [B35.1, M79.676] 04/01/2014  . Depression [F32.9] 03/19/2014    Total Time spent with patient: 25 minutes  Subjective:   NATE PERRI is a 54 y.o. female patient admitted with reports of suicidal ideation following crack-cocaine abuse. Pt seen and chart reviewed. Pt known to this NP from evaluation 3 days ago. Pt was intoxicated on crack-cocaine and was experiencing substance-induced changes in mood at the time. Pt is known to have a history of such and typically improves. Pt did begin to improve after coming down from her drug use and was treated and seen in the [facility] during this timeframe.   Pt seen and chart reviewed by NP and MD team this AM. Pt now denies suicidal and homicidal ideation and psychosis and does not appear to be responding to internal stimuli. Pt is also alert/oriented x4, calm, cooperative, and appropriate for discharge. She is treated by [facility] outpatient and wants to go there first thing in the  morning on Monday. Her family is willing to drive her there for treatment. Nursing staff reports that the pt has been doing very well.   HPI:  I have reviewed and concur with HPI below, modified as follows:  CHAMYA HUNTON is an 54 y.o. female who presents voluntarily to [facility] with c/o SI w/ a plan to overdose. Pt is oriented x 3, not oriented to the time (year, date, or day). She presents with generally irritable mood and expresses a desire to go home. Pt endorses ongoing SI. She says "I don't want to live..I just don't" and "this has been going on all my life". Pt would not specify suicidal intent or current plan. She admits to 3 prior suicide attempts by overdose. Pt denies HI or any hx of violence.  Pt indicates that she came into the hospital b/c she "wasn't feeling good" and was "hearing people talking". Pt reports that the people that are talking are currently in the room with her. She indicates that they don't speak to her or harm her in anyway, but they talk amongst themselves. Pt adds that one of the people, Judson Roch, does talk to her and said that Judson Roch was beside her. Counselor notes there to be no one sitting beside pt or standing around her.   Pt reports not eating or sleeping in a few days. She also admits that she hasn't taken her medications in the last week b/c "I didn't want to". Pt admits to using crack everyday, last use today before admission. Pt also admits to using ETOH everyday for at least a week. BAL  is 133. UDS not yet resulted. Pt continues to stress her desire to go home.   Pt spent the night in the ED without incident. Telepsychiatry consult ordered.   Past Medical History:  Past Medical History  Diagnosis Date  . Diabetes mellitus without complication   . Hypertension   . Depression     Past Surgical History  Procedure Laterality Date  . Cholecystectomy     Family History: History reviewed. No pertinent family history. Social History:  History  Alcohol Use  .  Yes    Comment: as much as she can for the last month     History  Drug Use  . Yes  . Special: Cocaine    Comment: crack    Social History   Social History  . Marital Status: Single    Spouse Name: N/A  . Number of Children: N/A  . Years of Education: N/A   Social History Main Topics  . Smoking status: Current Every Day Smoker  . Smokeless tobacco: None     Comment: states she cut back to 4 cigarettes/per day  . Alcohol Use: Yes     Comment: as much as she can for the last month  . Drug Use: Yes    Special: Cocaine     Comment: crack  . Sexual Activity: Not Asked   Other Topics Concern  . None   Social History Narrative   Additional Social History:    Pain Medications: see MAR Prescriptions: see MAR Over the Counter: see MAR History of alcohol / drug use?: Yes Longest period of sobriety (when/how long): unknown Negative Consequences of Use:  (unspecified) Withdrawal Symptoms:  (unknown, pt reports not staying clean long enough to know) Name of Substance 1: Crack 1 - Age of First Use: mid 40s 1 - Amount (size/oz): unknown 1 - Frequency: every day 1 - Duration: unknown 1 - Last Use / Amount: before hospital admission Name of Substance 2: ETOH 2 - Age of First Use: unknown 2 - Amount (size/oz): unknown 2 - Frequency: every day 2 - Duration: a week 2 - Last Use / Amount: before hospital admission                 Allergies:   Allergies  Allergen Reactions  . Aspirin Nausea And Vomiting    Upset stomach    Labs:  Results for orders placed or performed during the hospital encounter of 02/11/15 (from the past 48 hour(s))  CBG monitoring, ED     Status: Abnormal   Collection Time: 02/13/15  4:52 PM  Result Value Ref Range   Glucose-Capillary 196 (H) 65 - 99 mg/dL  CBG monitoring, ED     Status: Abnormal   Collection Time: 02/13/15 10:25 PM  Result Value Ref Range   Glucose-Capillary 110 (H) 65 - 99 mg/dL  CBG monitoring, ED     Status: Abnormal    Collection Time: 02/14/15  7:13 AM  Result Value Ref Range   Glucose-Capillary 139 (H) 65 - 99 mg/dL   Comment 1 Notify RN   CBG monitoring, ED     Status: Abnormal   Collection Time: 02/14/15 12:45 PM  Result Value Ref Range   Glucose-Capillary 100 (H) 65 - 99 mg/dL  CBG monitoring, ED     Status: Abnormal   Collection Time: 02/14/15  6:10 PM  Result Value Ref Range   Glucose-Capillary 109 (H) 65 - 99 mg/dL   Comment 1 Notify RN   CBG monitoring,  ED     Status: Abnormal   Collection Time: 02/14/15  8:22 PM  Result Value Ref Range   Glucose-Capillary 108 (H) 65 - 99 mg/dL   Comment 1 Notify RN    Comment 2 Document in Chart   CBG monitoring, ED     Status: Abnormal   Collection Time: 02/15/15  8:15 AM  Result Value Ref Range   Glucose-Capillary 151 (H) 65 - 99 mg/dL  CBG monitoring, ED     Status: Abnormal   Collection Time: 02/15/15 11:34 AM  Result Value Ref Range   Glucose-Capillary 147 (H) 65 - 99 mg/dL    Vitals: Blood pressure 115/66, pulse 76, temperature 97.7 F (36.5 C), temperature source Oral, resp. rate 16, height '5\' 2"'  (1.575 m), weight 72.576 kg (160 lb), SpO2 100 %.  Risk to Self: Suicidal Ideation: Yes-Currently Present Suicidal Intent: Yes-Currently Present Is patient at risk for suicide?: Yes Suicidal Plan?: No-Not Currently/Within Last 6 Months Access to Means: Yes Specify Access to Suicidal Means: access to rx medications What has been your use of drugs/alcohol within the last 12 months?: hx of use How many times?: 3 Other Self Harm Risks: 0 Triggers for Past Attempts: Unknown, Unpredictable Intentional Self Injurious Behavior: None Risk to Others: Homicidal Ideation: No Thoughts of Harm to Others: No Current Homicidal Intent: No Current Homicidal Plan: No Access to Homicidal Means: No History of harm to others?: No Assessment of Violence: None Noted Does patient have access to weapons?: No Criminal Charges Pending?: No Does patient have a court  date: No Prior Inpatient Therapy: Prior Inpatient Therapy: Yes Prior Therapy Dates: 2015 Prior Therapy Facilty/Provider(s): [facility] Reason for Treatment: polysubstance use disorder Prior Outpatient Therapy: Prior Outpatient Therapy: Yes Prior Therapy Dates: 2016 Prior Therapy Facilty/Provider(s): [facility] Reason for Treatment: bipolar, schizophrenia Does patient have an ACCT team?: No Does patient have Intensive In-House Services?  : No Does patient have [facility] services? : No Does patient have P4CC services?: No  Current Facility-Administered Medications  Medication Dose Route Frequency Provider Last Rate Last Dose  . acetaminophen (TYLENOL) tablet 650 mg  650 mg Oral Q4H PRN Mercedes Camprubi-Soms, PA-C   650 mg at 02/14/15 2132  . albuterol (PROVENTIL HFA;VENTOLIN HFA) 108 (90 BASE) MCG/ACT inhaler 1 puff  1 puff Inhalation Q6H PRN Mercedes Camprubi-Soms, PA-C      . alum & mag hydroxide-simeth (MAALOX/MYLANTA) 200-200-20 MG/5ML suspension 30 mL  30 mL Oral PRN Mercedes Camprubi-Soms, PA-C      . fluticasone (FLONASE) 50 MCG/ACT nasal spray 2 spray  2 spray Each Nare Daily Mercedes Camprubi-Soms, PA-C   2 spray at 02/15/15 1006  . insulin aspart (novoLOG) injection 0-15 Units  0-15 Units Subcutaneous TID WC Mercedes Camprubi-Soms, PA-C   2 Units at 02/15/15 1230  . insulin glargine (LANTUS) injection 15 Units  15 Units Subcutaneous Q2200 Mercedes Camprubi-Soms, PA-C   15 Units at 02/14/15 2131  . lisinopril (PRINIVIL,ZESTRIL) tablet 10 mg  10 mg Oral Daily Mercedes Camprubi-Soms, PA-C   10 mg at 02/15/15 0904  . loratadine (CLARITIN) tablet 10 mg  10 mg Oral Daily Mercedes Camprubi-Soms, PA-C   10 mg at 02/15/15 0904  . LORazepam (ATIVAN) tablet 1 mg  1 mg Oral Q8H PRN Mercedes Camprubi-Soms, PA-C   1 mg at 02/14/15 2132  . metFORMIN (GLUCOPHAGE) tablet 1,000 mg  1,000 mg Oral BID WC Mercedes Camprubi-Soms, PA-C   1,000 mg at 02/15/15 0904  . nicotine (NICODERM CQ - dosed in mg/24 hr) patch  7  mg  7 mg Transdermal Daily Milton Ferguson, MD   7 mg at 02/15/15 0903  . ondansetron (ZOFRAN) tablet 4 mg  4 mg Oral Q8H PRN Mercedes Camprubi-Soms, PA-C   4 mg at 02/15/15 0111  . simvastatin (ZOCOR) tablet 20 mg  20 mg Oral q1800 Mercedes Camprubi-Soms, PA-C   20 mg at 02/14/15 1849  . thiamine (VITAMIN B-1) tablet 100 mg  100 mg Oral Daily Mercedes Camprubi-Soms, PA-C   100 mg at 02/15/15 0904  . zolpidem (AMBIEN) tablet 5 mg  5 mg Oral QHS PRN Mercedes Camprubi-Soms, PA-C   5 mg at 02/14/15 2132   Current Outpatient Prescriptions  Medication Sig Dispense Refill  . diclofenac (VOLTAREN) 75 MG EC tablet Take 1 tablet (75 mg total) by mouth 2 (two) times daily as needed. 60 tablet 2  . traZODone (DESYREL) 100 MG tablet Take 1 tablet (100 mg total) by mouth at bedtime. For depression and insomnia 30 tablet 0  . albuterol (PROVENTIL HFA;VENTOLIN HFA) 108 (90 BASE) MCG/ACT inhaler Inhale 1 puff into the lungs every 6 (six) hours as needed for wheezing or shortness of breath. 6.7 g 6  . benztropine (COGENTIN) 2 MG tablet Take 2 mg by mouth at bedtime.     . Blood Glucose Monitoring Suppl (ACCU-CHEK AVIVA PLUS) W/DEVICE KIT 1 Device by Does not apply route 3 (three) times daily after meals. 1 kit 0  . cetirizine (ZYRTEC) 10 MG tablet Take 1 tablet (10 mg total) by mouth daily. 30 tablet 11  . fluticasone (FLONASE) 50 MCG/ACT nasal spray Place 2 sprays into both nostrils daily. 16 g 0  . glucose blood (ACCU-CHEK AVIVA PLUS) test strip 1 each by Other route 3 (three) times daily. 100 each 12  . Insulin Glargine (LANTUS SOLOSTAR) 100 UNIT/ML Solostar Pen Inject 15 Units into the skin daily at 10 pm. 5 pen 3  . Insulin Pen Needle (ULTICARE MICRO PEN NEEDLES) 32G X 4 MM MISC 1 each by Does not apply route at bedtime. 100 each 11  . Insulin Syringes, Disposable, U-100 0.5 ML MISC 1 each by Does not apply route at bedtime. 100 each 11  . Lancets (ACCU-CHEK SOFT TOUCH) lancets 1 each by Other route 3 (three)  times daily. 100 each 12  . Lancets Misc. (ACCU-CHEK SOFTCLIX LANCET DEV) KIT 1 each by Does not apply route 3 (three) times daily. 1 kit 0  . lisinopril (PRINIVIL,ZESTRIL) 10 MG tablet Take 1 tablet (10 mg total) by mouth daily. For blood pressure 14 tablet 0  . metFORMIN (GLUCOPHAGE) 1000 MG tablet Take 1 tablet (1,000 mg total) by mouth 2 (two) times daily with a meal. 28 tablet 0  . QUEtiapine (SEROQUEL) 200 MG tablet Take 400 mg by mouth at bedtime.     . sertraline (ZOLOFT) 100 MG tablet Take 1 tablet (100 mg total) by mouth daily. For depression (Patient not taking: Reported on 12/22/2014) 30 tablet 0  . simvastatin (ZOCOR) 20 MG tablet Take 1 tablet (20 mg total) by mouth daily at 6 PM. For high cholesterol 30 tablet 0  . terbinafine (LAMISIL) 250 MG tablet Take 1 tablet (250 mg total) by mouth daily. (Patient not taking: Reported on 02/11/2015) 30 tablet 2  . triamcinolone (NASACORT AQ) 55 MCG/ACT AERO nasal inhaler Place 2 sprays into the nose daily. (Patient not taking: Reported on 12/22/2014) 1 Inhaler 12    Musculoskeletal: UTO, camera  Psychiatric Specialty Exam: Physical Exam  Review of Systems  Psychiatric/Behavioral:  Positive for depression and substance abuse. The patient is nervous/anxious.   All other systems reviewed and are negative.   Blood pressure 115/66, pulse 76, temperature 97.7 F (36.5 C), temperature source Oral, resp. rate 16, height '5\' 2"'  (1.575 m), weight 72.576 kg (160 lb), SpO2 100 %.Body mass index is 29.26 kg/(m^2).  General Appearance: Casual and Fairly Groomed  Engineer, water::  Good  Speech:  Clear and Coherent and Normal Rate  Volume:  Normal  Mood:  Euthymic  Affect:  Appropriate and Congruent  Thought Process:  Coherent and Goal Directed  Orientation:  Full (Time, Place, and Person)  Thought Content:  WDL  Suicidal Thoughts:  No  Homicidal Thoughts:  No  Memory:  Immediate;   Fair Recent;   Fair Remote;   Fair  Judgement:  Fair  Insight:  Fair   Psychomotor Activity:  Normal  Concentration:  Fair  Recall:  [employer] [place]  Language: Fair  Akathisia:  No  Handed:    AIMS (if indicated):     Assets:  Desire for Improvement Resilience Social Support  ADL's:  Intact  Cognition: WNL  Sleep:      Medical Decision Making: Established Problem, Stable/Improving (1), Review of Psycho-Social Stressors (1), Review or order clinical lab tests (1), Review of Medication Regimen & Side Effects (2) and Review of New Medication or Change in Dosage (2)  Treatment Plan Summary: See below  Plan:  No evidence of imminent risk to self or others at present.   Patient does not meet criteria for psychiatric inpatient admission. Supportive therapy provided about ongoing stressors. Refer to IOP. Discussed crisis plan, support from social network, calling 911, coming to the Emergency Department, and calling Suicide Hotline.  Disposition:  -Discharge home -Follow-up with [facility] tomorrow  Benjamine Mola, FNP-BC 02/15/2015 2:15 PM   Patient seen face-to-face for the psychiatric evaluation, case discussed with the physician extender, denied current alcohol withdrawal symptoms, symptoms of depression, anxiety, psychosis and has been calm and cooperative during this evaluation. Patient does not meet criteria for acute psychiatric hospitalization so patient will be referred to the outpatient medication management. Reviewed the information documented and agree with the treatment plan.   Breanne Olvera,JANARDHAHA R. 02/15/2015 4:58 PM

## 2015-02-19 ENCOUNTER — Encounter: Payer: Self-pay | Admitting: Pharmacist

## 2015-02-19 ENCOUNTER — Ambulatory Visit: Payer: Medicaid Other | Attending: Family Medicine | Admitting: Pharmacist

## 2015-02-19 VITALS — BP 122/80 | HR 90

## 2015-02-19 DIAGNOSIS — Z794 Long term (current) use of insulin: Secondary | ICD-10-CM | POA: Insufficient documentation

## 2015-02-19 DIAGNOSIS — E119 Type 2 diabetes mellitus without complications: Secondary | ICD-10-CM | POA: Diagnosis not present

## 2015-02-19 DIAGNOSIS — I10 Essential (primary) hypertension: Secondary | ICD-10-CM | POA: Insufficient documentation

## 2015-02-19 DIAGNOSIS — Z720 Tobacco use: Secondary | ICD-10-CM | POA: Insufficient documentation

## 2015-02-19 LAB — GLUCOSE, POCT (MANUAL RESULT ENTRY): POC Glucose: 103 mg/dl — AB (ref 70–99)

## 2015-02-19 MED ORDER — SIMVASTATIN 20 MG PO TABS: 20.0000 mg | ORAL_TABLET | Freq: Every day | ORAL | Status: DC

## 2015-02-19 MED ORDER — LISINOPRIL 10 MG PO TABS: 10.0000 mg | ORAL_TABLET | Freq: Every day | ORAL | Status: DC

## 2015-02-19 NOTE — Progress Notes (Signed)
S:    Patient arrives in good spirits.  She presents to the clinic for hypertension evaluation and a blood glucose check.    Patient denies adherence with medications. She reports that she needs a refill on her blood pressure medication and statin. She ran out of her lisinopril after she left the ED the other day. She also reports that she is out of her medications from [facility] and cannot afford the copays.  Current BP Medications include:  Lisinopril 10 mg daily   O:   Last 3 Office BP readings: BP Readings from Last 3 Encounters:  02/19/15 122/80  02/15/15 106/71  12/22/14 127/79   Lab Results  Component Value Date   HGBA1C 8.70 12/22/2014     BMET    Component Value Date/Time   NA 142 02/11/2015 0752   K 3.4* 02/11/2015 0752   CL 109 02/11/2015 0752   CO2 18* 02/11/2015 0752   GLUCOSE 158* 02/11/2015 0752   BUN 6 02/11/2015 0752   CREATININE 0.61 02/11/2015 0752   CALCIUM 9.3 02/11/2015 0752   GFRNONAA >60 02/11/2015 0752   GFRAA >60 02/11/2015 0752    A/P:  Hypertension which currently is controlled while off medication. Will refill her lisinopril for renal protection with diabetes, as she was never hypotensive on that medication. Last BMET was normal with a potassium of 0.34 and Scr 0.69. Patient instructed to follow up in the next few weeks with Dr. Armen Pickup for evaluation. Also instructed patient to follow up with [facility] for psych management. Patient spoke with Asher Muir today.      Diabetes which is currently uncontrolled based on A1c of 8.7 but reading in office was controlled at 103 mg/dL. No recommendations for changes at this time. Will be due for next A1c in October 2016. Follow up with Dr. Armen Pickup.  Results reviewed and written information provided.   F/U Clinic Visit with Dr. Armen Pickup. Total time in face-to-face counseling 10 minutes.

## 2015-02-19 NOTE — Patient Instructions (Addendum)
Thank you for coming today!  Follow up with Dr. Armen Pickup.

## 2015-05-28 ENCOUNTER — Other Ambulatory Visit: Payer: Self-pay | Admitting: Family Medicine

## 2015-05-28 ENCOUNTER — Encounter: Payer: Self-pay | Admitting: Family Medicine

## 2015-05-28 ENCOUNTER — Ambulatory Visit (HOSPITAL_BASED_OUTPATIENT_CLINIC_OR_DEPARTMENT_OTHER): Payer: Medicaid Other | Admitting: Clinical

## 2015-05-28 ENCOUNTER — Encounter (HOSPITAL_BASED_OUTPATIENT_CLINIC_OR_DEPARTMENT_OTHER): Payer: Medicaid Other | Admitting: Clinical

## 2015-05-28 ENCOUNTER — Other Ambulatory Visit (HOSPITAL_COMMUNITY)
Admission: RE | Admit: 2015-05-28 | Discharge: 2015-05-28 | Disposition: A | Discharge status: Home / Self Care | Payer: Medicaid Other | Source: Ambulatory Visit | Attending: Family Medicine | Admitting: Family Medicine

## 2015-05-28 ENCOUNTER — Ambulatory Visit: Payer: Medicaid Other | Attending: Family Medicine | Admitting: Family Medicine

## 2015-05-28 VITALS — BP 97/63 | HR 86 | Temp 98.0°F | Resp 16 | Ht 62.0 in | Wt 154.0 lb

## 2015-05-28 DIAGNOSIS — Z7984 Long term (current) use of oral hypoglycemic drugs: Secondary | ICD-10-CM | POA: Diagnosis not present

## 2015-05-28 DIAGNOSIS — Z114 Encounter for screening for human immunodeficiency virus [HIV]: Secondary | ICD-10-CM | POA: Diagnosis not present

## 2015-05-28 DIAGNOSIS — B351 Tinea unguium: Secondary | ICD-10-CM

## 2015-05-28 DIAGNOSIS — A5901 Trichomonal vulvovaginitis: Secondary | ICD-10-CM

## 2015-05-28 DIAGNOSIS — Z124 Encounter for screening for malignant neoplasm of cervix: Secondary | ICD-10-CM

## 2015-05-28 DIAGNOSIS — M79676 Pain in unspecified toe(s): Secondary | ICD-10-CM | POA: Diagnosis not present

## 2015-05-28 DIAGNOSIS — F332 Major depressive disorder, recurrent severe without psychotic features: Secondary | ICD-10-CM

## 2015-05-28 DIAGNOSIS — N898 Other specified noninflammatory disorders of vagina: Secondary | ICD-10-CM

## 2015-05-28 DIAGNOSIS — F329 Major depressive disorder, single episode, unspecified: Secondary | ICD-10-CM

## 2015-05-28 DIAGNOSIS — Z1159 Encounter for screening for other viral diseases: Secondary | ICD-10-CM | POA: Diagnosis not present

## 2015-05-28 DIAGNOSIS — Z1151 Encounter for screening for human papillomavirus (HPV): Secondary | ICD-10-CM | POA: Diagnosis present

## 2015-05-28 DIAGNOSIS — M79641 Pain in right hand: Secondary | ICD-10-CM | POA: Insufficient documentation

## 2015-05-28 DIAGNOSIS — F32A Depression, unspecified: Secondary | ICD-10-CM

## 2015-05-28 DIAGNOSIS — Z794 Long term (current) use of insulin: Secondary | ICD-10-CM | POA: Diagnosis not present

## 2015-05-28 DIAGNOSIS — F17210 Nicotine dependence, cigarettes, uncomplicated: Secondary | ICD-10-CM | POA: Insufficient documentation

## 2015-05-28 DIAGNOSIS — M79642 Pain in left hand: Secondary | ICD-10-CM | POA: Diagnosis not present

## 2015-05-28 DIAGNOSIS — Z79899 Other long term (current) drug therapy: Secondary | ICD-10-CM | POA: Insufficient documentation

## 2015-05-28 DIAGNOSIS — Z0000 Encounter for general adult medical examination without abnormal findings: Secondary | ICD-10-CM

## 2015-05-28 DIAGNOSIS — Z01411 Encounter for gynecological examination (general) (routine) with abnormal findings: Secondary | ICD-10-CM | POA: Diagnosis present

## 2015-05-28 DIAGNOSIS — E119 Type 2 diabetes mellitus without complications: Secondary | ICD-10-CM | POA: Diagnosis not present

## 2015-05-28 LAB — POCT GLYCOSYLATED HEMOGLOBIN (HGB A1C): Hemoglobin A1C: 9.7

## 2015-05-28 LAB — GLUCOSE, POCT (MANUAL RESULT ENTRY): POC Glucose: 169 mg/dl — AB (ref 70–99)

## 2015-05-28 LAB — HIV ANTIBODY (ROUTINE TESTING W REFLEX): HIV 1&2 Ab, 4th Generation: NONREACTIVE

## 2015-05-28 MED ORDER — INSULIN GLARGINE 100 UNIT/ML SOLOSTAR PEN: 20.0000 [IU] | PEN_INJECTOR | Freq: Every day | SUBCUTANEOUS | Status: DC

## 2015-05-28 MED ORDER — SITAGLIPTIN PHOSPHATE 100 MG PO TABS: 100.0000 mg | ORAL_TABLET | Freq: Every day | ORAL | Status: DC

## 2015-05-28 MED ORDER — NORTRIPTYLINE HCL 50 MG PO CAPS: 50.0000 mg | ORAL_CAPSULE | Freq: Every day | ORAL | Status: DC

## 2015-05-28 MED ORDER — TERBINAFINE HCL 250 MG PO TABS: 250.0000 mg | ORAL_TABLET | Freq: Every day | ORAL | Status: DC

## 2015-05-28 MED ORDER — METFORMIN HCL 1000 MG PO TABS: 1000.0000 mg | ORAL_TABLET | Freq: Two times a day (BID) | ORAL | Status: DC

## 2015-05-28 MED ORDER — INSULIN PEN NEEDLE 32G X 4 MM MISC: 1.0000 | Freq: Every day | Status: DC

## 2015-05-28 NOTE — Progress Notes (Signed)
F/U DM Glucose running 180-400 Taking medication daily  C/C vaginal discharge no odor  Pain scale 10 hands  Tobacco user 4 cigarette per day  Pt stated feeling suicidal since she lost her friend on Dec 1,2016

## 2015-05-28 NOTE — Assessment & Plan Note (Signed)
A: pain in toenails P: lamisil

## 2015-05-28 NOTE — Patient Instructions (Addendum)
Robin BeechamCynthia was seen today for diabetes and vaginal discharge.  Diagnoses and all orders for this visit:  Diabetes mellitus type 2, insulin dependent (HCC) -     POCT glycosylated hemoglobin (Hb A1C) -     POCT glucose (manual entry) -     Insulin Glargine (LANTUS SOLOSTAR) 100 UNIT/ML Solostar Pen; Inject 20 Units into the skin daily at 10 pm. -     sitaGLIPtin (JANUVIA) 100 MG tablet; Take 1 tablet (100 mg total) by mouth daily. -     metFORMIN (GLUCOPHAGE) 1000 MG tablet; Take 1 tablet (1,000 mg total) by mouth 2 (two) times daily with a meal. -     Insulin Pen Needle (ULTICARE MICRO PEN NEEDLES) 32G X 4 MM MISC; 1 each by Does not apply route at bedtime.  Healthcare maintenance -     Flu Vaccine QUAD 36+ mos IM  Pap smear for cervical cancer screening -     Cytology - PAP [name]  Vaginal discharge -     Cytology - PAP [name]  Pain due to onychomycosis of toenail -     terbinafine (LAMISIL) 250 MG tablet; Take 1 tablet (250 mg total) by mouth daily.  Depression -     nortriptyline (PAMELOR) 50 MG capsule; Take 1 capsule (50 mg total) by mouth at bedtime.  Screening for HIV (human immunodeficiency virus) -     HIV antibody (with reflex)  Need for hepatitis C screening test -     Hepatitis C Ab Reflex HCV RNA, QUANT  Started pamelor for depression, to help you sleep and to help with hand pain. I still recommend mental health as a mood stabilizer may also be needed.   [facility]  [facility] accessible and [facility].    You will be called with pap results  F/u in 4 weeks for depression and hand pain  Dr. Armen PickupFunches

## 2015-05-28 NOTE — Progress Notes (Signed)
ASSESSMENT: Pt currently experiencing major depressive disorder episode, without psychosis, severe. [facility]; would benefit from ongoing therapy to cope with major depressive episode.  Stage of Change: contemplative  PLAN: 1. F/U with behavioral health consultant in one month 2. Psychiatric Medications: pamelor (start today). 3. Behavioral recommendation(s):   -[facility] -Consider hospice for grief counseling -Consider reading educational material regarding coping with symptoms of depression and anxiety SUBJECTIVE: Pt. referred by Dr Armen PickupFunches for symptoms of anxiety and depression:  Pt. reports the following symptoms/concerns: Pt states that she has been "dealing with depression my entire life", but feels depressed right now because her friend passed away less than one month ago, and she found out he had no family to claim him.. She has previously [facility] but has been out of her BH medications for 3 months. Primary concern for her is getting [facility]. Pt denies current SI today.  Duration of problem: About one month Severity: severe  OBJECTIVE: Orientation & Cognition: Oriented x3. Thought processes normal and appropriate to situation. Mood: appropriate. Affect: appropriate Appearance: appropriate Risk of harm to self or others: low risk of harm to self today, no risk of harm to others Substance use: tobacco Assessments administered: PHQ9: 26/ GAD7: 21  Diagnosis: Major depressive disorder, recurrent severe, without psychosis CPT Code: F33.2 -------------------------------------------- Other(s) present in the room: none  Time spent with patient in exam room: 12 minutes

## 2015-05-28 NOTE — Progress Notes (Signed)
Patient ID: Robin Montgomery, female   DOB: [birth year], 54 y.o.   MRN: [id number]   Subjective:  Patient ID: Robin Montgomery, female    DOB: [birth year]  Age: 54 y.o. MRN: [id number]  CC: No chief complaint on file.   HPI SERAIAH NOWACK presents for   1. CHRONIC DIABETES  Disease Monitoring  Blood Sugar Ranges: 180-400  Polyuria: yes   Visual problems: yes, blurry    Medication Compliance: yes  Medication Side Effects  Hypoglycemia: no   Preventitive Health Care  Eye Exam: done was prescribed glasses, could not afford them   Foot Exam: done today   Diet pattern: was drinking soda, none now   Exercise: minimal   2. Vaginal discharge: x 4 days. Has an affair 4 days ago. Had some irritation in L inguinal area. No rash. No pelvic pain. No fever or chills. Requesting STD testing. Due for pap.   3. Depression:  Has hx of depression in setting of cocaine abuse. She has been treated in past at [facility] for depression and SI. She had a friend die 3 weeks ago which exacerbated her depression. She also endorses trouble sleeping. She has pain in both hands not relieved with OTC tylenol and NSAID. She is amenable to seeking mental health care. She is amenable to antidepressant.   Social History   Social History Narrative   Social History   Social History  . Marital Status: Single    Spouse Name: N/A  . Number of Children: N/A  . Years of Education: N/A   Occupational History  . Not on file.   Social History Main Topics  . Smoking status: Current Every Day Smoker  . Smokeless tobacco: Not on file     Comment: states she cut back to 4 cigarettes/per day  . Alcohol Use: 0.0 oz/week    0 Standard drinks or equivalent per week     Comment: as much as she can for the last month  . Drug Use: Yes    Special: Cocaine     Comment: crack last used January 2016  . Sexual Activity: Not on file   Other Topics Concern  . Not on file   Social History Narrative    Outpatient  Prescriptions Prior to Visit  Medication Sig Dispense Refill  . albuterol (PROVENTIL HFA;VENTOLIN HFA) 108 (90 BASE) MCG/ACT inhaler Inhale 1 puff into the lungs every 6 (six) hours as needed for wheezing or shortness of breath. 6.7 g 6  . Blood Glucose Monitoring Suppl (ACCU-CHEK AVIVA PLUS) W/DEVICE KIT 1 Device by Does not apply route 3 (three) times daily after meals. 1 kit 0  . Insulin Glargine (LANTUS SOLOSTAR) 100 UNIT/ML Solostar Pen Inject 15 Units into the skin daily at 10 pm. 5 pen 3  . Insulin Pen Needle (ULTICARE MICRO PEN NEEDLES) 32G X 4 MM MISC 1 each by Does not apply route at bedtime. 100 each 11  . lisinopril (PRINIVIL,ZESTRIL) 10 MG tablet Take 1 tablet (10 mg total) by mouth daily. For blood pressure 30 tablet 5  . metFORMIN (GLUCOPHAGE) 1000 MG tablet Take 1 tablet (1,000 mg total) by mouth 2 (two) times daily with a meal. 28 tablet 0  . simvastatin (ZOCOR) 20 MG tablet Take 1 tablet (20 mg total) by mouth daily at 6 PM. For high cholesterol 30 tablet 5  . cetirizine (ZYRTEC) 10 MG tablet Take 1 tablet (10 mg total) by mouth daily. (Patient not taking: Reported on 05/28/2015) 30 tablet  11  . fluticasone (FLONASE) 50 MCG/ACT nasal spray Place 2 sprays into both nostrils daily. (Patient not taking: Reported on 05/28/2015) 16 g 0   No facility-administered medications prior to visit.    ROS Review of Systems  Constitutional: Negative for fever and chills.  Eyes: Positive for visual disturbance.  Respiratory: Negative for shortness of breath.   Cardiovascular: Negative for chest pain.  Gastrointestinal: Negative for abdominal pain and blood in stool.  Endocrine: Positive for polydipsia and polyuria. Negative for polyphagia.  Genitourinary: Positive for vaginal discharge.  Musculoskeletal: Positive for arthralgias. Negative for back pain.  Skin: Negative for rash.  Allergic/Immunologic: Negative for immunocompromised state.  Hematological: Negative for adenopathy. Does not  bruise/bleed easily.  Psychiatric/Behavioral: Positive for suicidal ideas and dysphoric mood.    Objective:  BP 97/63 mmHg  Pulse 86  Temp(Src) 98 F (36.7 C) (Oral)  Resp 16  Ht '5\' 2"'  (1.575 m)  Wt 154 lb (69.854 kg)  BMI 28.16 kg/m2  SpO2 96%  BP/Weight 05/28/2015 02/19/2015 2/53/6644  Systolic BP 97 034 742  Diastolic BP 63 80 71  Wt. (Lbs) 154 - -  BMI 28.16 - -  Some encounter information is confidential and restricted. Go to Review Flowsheets activity to see all data.    Physical Exam  Constitutional: She appears well-developed and well-nourished. No distress.  Cardiovascular: Normal rate, regular rhythm, normal heart sounds and intact distal pulses.   Pulmonary/Chest: Effort normal and breath sounds normal.  Genitourinary: Uterus normal. Pelvic exam was performed with patient prone. There is no rash, tenderness or lesion on the right labia. There is no rash, tenderness or lesion on the left labia. Cervix exhibits discharge. Cervix exhibits no motion tenderness and no friability. No erythema, tenderness or bleeding in the vagina. Vaginal discharge found.  Musculoskeletal: She exhibits no edema.  Lymphadenopathy:       Right: No inguinal adenopathy present.       Left: No inguinal adenopathy present.  Skin: Skin is warm and dry. No rash noted.   Lab Results  Component Value Date   HGBA1C 9.70 05/28/2015   CBG 169 Depression screen [facility] 2/9 05/28/2015 12/22/2014 04/01/2014  Decreased Interest '3 2 3  ' Down, Depressed, Hopeless '3 2 3  ' PHQ - 2 Score '6 4 6  ' Altered sleeping 3 0 3  Tired, decreased energy '3 3 3  ' Change in appetite '3 3 2  ' Feeling bad or failure about yourself  '3 1 3  ' Trouble concentrating '3 1 3  ' Moving slowly or fidgety/restless '3 2 3  ' Suicidal thoughts '2 1 2  ' PHQ-9 Score '26 15 25   ' GAD 7 : Generalized Anxiety Score 05/28/2015  Nervous, Anxious, on Edge 3  Control/stop worrying 3  Worry too much - different things 3  Trouble relaxing 3  Restless 3    Easily annoyed or irritable 3  Afraid - awful might happen 3  Total GAD 7 Score 21    Assessment & Plan:   Problem List Items Addressed This Visit    Depression    A: depression with SI, pain in hands and trouble sleeping P: Starting TCA Patient referred to speak with CSW, Roselyn Reef  Patient advised to go to mental health  Close f/u in 4 weeks       Relevant Medications   nortriptyline (PAMELOR) 50 MG capsule   Diabetes mellitus type 2, insulin dependent ([place]) - Primary (Chronic)    A: diabetes uncontrolled with elevated A1c Med compliant P: Increase  lantus to 20 U Add januvia 100 mg Continue metformin 1000 mg BID       Relevant Medications   Insulin Glargine (LANTUS SOLOSTAR) 100 UNIT/ML Solostar Pen   sitaGLIPtin (JANUVIA) 100 MG tablet   metFORMIN (GLUCOPHAGE) 1000 MG tablet   Insulin Pen Needle (ULTICARE MICRO PEN NEEDLES) 32G X 4 MM MISC   Other Relevant Orders   POCT glycosylated hemoglobin (Hb A1C) (Completed)   POCT glucose (manual entry) (Completed)   RESOLVED: Healthcare maintenance   Relevant Orders   Flu Vaccine QUAD 36+ mos IM   Pain due to onychomycosis of toenail (Chronic)    A: pain in toenails P: lamisil       Relevant Medications   terbinafine (LAMISIL) 250 MG tablet    Other Visit Diagnoses    Pap smear for cervical cancer screening        Relevant Orders    Cytology - PAP [place]    Vaginal discharge        Relevant Orders    Cytology - PAP [place]    Screening for HIV (human immunodeficiency virus)        Relevant Orders    HIV antibody (with reflex)    Need for hepatitis C screening test        Relevant Orders    Hepatitis C Ab Reflex HCV RNA, QUANT       No orders of the defined types were placed in this encounter.    Follow-up: No Follow-up on file.   Boykin Nearing MD

## 2015-05-28 NOTE — Assessment & Plan Note (Signed)
A: diabetes uncontrolled with elevated A1c Med compliant P: Increase lantus to 20 U Add januvia 100 mg Continue metformin 1000 mg BID

## 2015-05-28 NOTE — Assessment & Plan Note (Signed)
A: depression with SI, pain in hands and trouble sleeping P: Starting TCA Patient referred to speak with CSW, Asher MuirJamie  Patient advised to go to mental health  Close f/u in 4 weeks

## 2015-05-29 LAB — HEPATITIS C ANTIBODY: HCV Ab: NEGATIVE

## 2015-05-29 LAB — CERVICOVAGINAL ANCILLARY ONLY
Chlamydia: NEGATIVE
Neisseria Gonorrhea: NEGATIVE
Trichomonas: POSITIVE — AB
Wet Prep (BD Affirm): NEGATIVE

## 2015-05-29 LAB — CYTOLOGY - PAP

## 2015-05-29 MED ORDER — FLUCONAZOLE 150 MG PO TABS: 150.0000 mg | ORAL_TABLET | Freq: Once | ORAL | Status: DC

## 2015-05-29 MED ORDER — METRONIDAZOLE 500 MG PO TABS: 500.0000 mg | ORAL_TABLET | Freq: Two times a day (BID) | ORAL | Status: DC

## 2015-05-29 NOTE — Addendum Note (Signed)
Addended by: Dessa PhiFUNCHES, Zebulin Siegel on: 05/29/2015 03:46 PM   Modules accepted: Orders

## 2015-06-02 ENCOUNTER — Telehealth: Payer: Self-pay

## 2015-06-02 NOTE — Telephone Encounter (Signed)
Tried to contact patient Patient not available Left message with family member to have her return our call 

## 2015-06-02 NOTE — Telephone Encounter (Signed)
-----   Message from Dessa PhiJosalyn Funches, MD sent at 05/29/2015  9:08 AM EST ----- Screening hep C negative

## 2015-06-09 ENCOUNTER — Telehealth: Payer: Self-pay

## 2015-06-09 NOTE — Telephone Encounter (Signed)
-----   Message from Dessa PhiJosalyn Funches, MD sent at 05/29/2015  3:44 PM EST ----- Trich positive, flagyl ordered 500 mg BID for 7 days followed by diflucan to prevent yeast

## 2015-06-09 NOTE — Telephone Encounter (Signed)
Spoke with patient this am and she is aware of her lab results and for  Positive testing for trich Call transferred to the pharmacy to make sure her medication is still available (flagyl)

## 2015-06-11 ENCOUNTER — Telehealth: Payer: Self-pay | Admitting: *Deleted

## 2015-06-11 MED FILL — LISINOPRIL 10 MG TABLET: 10 | 30 days supply | Qty: 30 | Fill #2

## 2015-06-11 MED FILL — SIMVASTATIN 20 MG TABLET: 20 | 30 days supply | Qty: 30 | Fill #2

## 2015-06-11 NOTE — Telephone Encounter (Signed)
error 

## 2015-07-03 ENCOUNTER — Ambulatory Visit: Payer: Self-pay | Admitting: Family Medicine

## 2015-10-09 ENCOUNTER — Other Ambulatory Visit: Payer: Self-pay | Admitting: Family Medicine

## 2015-10-09 MED FILL — TERBINAFINE HCL 250 MG TAB: 250 | 30 days supply | Qty: 30 | Fill #1

## 2015-10-09 MED FILL — NORTRIPTYLINE HCL 50 MG CAP: 50 | 30 days supply | Qty: 30 | Fill #1

## 2015-10-09 MED FILL — JANUVIA 100 MG TABLET: 100 | 30 days supply | Qty: 30 | Fill #1

## 2015-10-09 MED FILL — LANTUS SOLOSTAR 100 UNITS/M: 100 | 30 days supply | Qty: 6 | Fill #0

## 2015-10-09 MED FILL — metFORMIN HCL 1000 MG TABS: 1000 | 30 days supply | Qty: 60 | Fill #1

## 2015-10-09 MED FILL — PROAIR HFA 90 MCG INHALER: 108 (90 BAS | 30 days supply | Qty: 9 | Fill #0

## 2015-10-09 MED FILL — ACCU-CHEK AVIVA PLUS TEST S: 30 days supply | Qty: 100 | Fill #0

## 2015-10-09 MED FILL — LISINOPRIL 10 MG TABLET: 10 | 30 days supply | Qty: 30 | Fill #3

## 2015-10-09 MED FILL — SIMVASTATIN 20 MG TABLET: 20 | 30 days supply | Qty: 30 | Fill #3

## 2015-10-09 MED FILL — ULTICARE PEN NDL 4MM 32G: 32G X 4 MM | 25 days supply | Qty: 100 | Fill #3

## 2015-10-26 ENCOUNTER — Ambulatory Visit: Payer: Self-pay | Admitting: Family Medicine

## 2015-11-17 ENCOUNTER — Ambulatory Visit: Payer: Medicaid Other | Attending: Family Medicine | Admitting: Family Medicine

## 2015-11-17 ENCOUNTER — Other Ambulatory Visit (HOSPITAL_COMMUNITY)
Admission: RE | Admit: 2015-11-17 | Discharge: 2015-11-17 | Disposition: A | Discharge status: Home / Self Care | Payer: Medicaid Other | Source: Ambulatory Visit | Attending: Family Medicine | Admitting: Family Medicine

## 2015-11-17 ENCOUNTER — Encounter: Payer: Self-pay | Admitting: Family Medicine

## 2015-11-17 VITALS — BP 108/73 | HR 86 | Temp 97.7°F | Resp 16 | Ht 62.0 in | Wt 149.0 lb

## 2015-11-17 DIAGNOSIS — E119 Type 2 diabetes mellitus without complications: Secondary | ICD-10-CM | POA: Diagnosis not present

## 2015-11-17 DIAGNOSIS — N760 Acute vaginitis: Secondary | ICD-10-CM | POA: Insufficient documentation

## 2015-11-17 DIAGNOSIS — N898 Other specified noninflammatory disorders of vagina: Secondary | ICD-10-CM | POA: Diagnosis not present

## 2015-11-17 DIAGNOSIS — F32A Depression, unspecified: Secondary | ICD-10-CM

## 2015-11-17 DIAGNOSIS — Z794 Long term (current) use of insulin: Secondary | ICD-10-CM | POA: Diagnosis not present

## 2015-11-17 DIAGNOSIS — Z7984 Long term (current) use of oral hypoglycemic drugs: Secondary | ICD-10-CM | POA: Diagnosis not present

## 2015-11-17 DIAGNOSIS — Z79899 Other long term (current) drug therapy: Secondary | ICD-10-CM | POA: Insufficient documentation

## 2015-11-17 DIAGNOSIS — F17200 Nicotine dependence, unspecified, uncomplicated: Secondary | ICD-10-CM | POA: Insufficient documentation

## 2015-11-17 DIAGNOSIS — F329 Major depressive disorder, single episode, unspecified: Secondary | ICD-10-CM | POA: Insufficient documentation

## 2015-11-17 DIAGNOSIS — I10 Essential (primary) hypertension: Secondary | ICD-10-CM | POA: Diagnosis not present

## 2015-11-17 DIAGNOSIS — Z113 Encounter for screening for infections with a predominantly sexual mode of transmission: Secondary | ICD-10-CM | POA: Diagnosis not present

## 2015-11-17 LAB — GLUCOSE, POCT (MANUAL RESULT ENTRY): POC Glucose: 226 mg/dl — AB (ref 70–99)

## 2015-11-17 LAB — POCT GLYCOSYLATED HEMOGLOBIN (HGB A1C): Hemoglobin A1C: 11.4

## 2015-11-17 MED ORDER — NORTRIPTYLINE HCL 50 MG PO CAPS: 50.0000 mg | ORAL_CAPSULE | Freq: Every day | ORAL | Status: DC

## 2015-11-17 MED ORDER — GLUCOSE BLOOD VI STRP: 1.0000 | ORAL_STRIP | Freq: Three times a day (TID) | Status: DC

## 2015-11-17 MED ORDER — FLUCONAZOLE 150 MG PO TABS: 150.0000 mg | ORAL_TABLET | Freq: Once | ORAL | Status: DC

## 2015-11-17 MED ORDER — METFORMIN HCL 1000 MG PO TABS: 1000.0000 mg | ORAL_TABLET | Freq: Two times a day (BID) | ORAL | Status: DC

## 2015-11-17 MED ORDER — ATORVASTATIN CALCIUM 40 MG PO TABS: 40.0000 mg | ORAL_TABLET | Freq: Every day | ORAL | Status: DC

## 2015-11-17 MED ORDER — INSULIN PEN NEEDLE 32G X 4 MM MISC: 1.0000 | Freq: Every day | Status: DC

## 2015-11-17 MED ORDER — SITAGLIPTIN PHOSPHATE 100 MG PO TABS: 100.0000 mg | ORAL_TABLET | Freq: Every day | ORAL | Status: DC

## 2015-11-17 MED ORDER — METRONIDAZOLE 500 MG PO TABS: 500.0000 mg | ORAL_TABLET | Freq: Two times a day (BID) | ORAL | Status: DC

## 2015-11-17 MED ORDER — INSULIN GLARGINE 100 UNIT/ML SOLOSTAR PEN: 30.0000 [IU] | PEN_INJECTOR | Freq: Every day | SUBCUTANEOUS | Status: DC

## 2015-11-17 MED ORDER — ACCU-CHEK SOFTCLIX LANCETS MISC: 1.0000 | Freq: Three times a day (TID) | Status: DC

## 2015-11-17 MED ORDER — ACCU-CHEK AVIVA PLUS W/DEVICE KIT: 1.0000 | PACK | Freq: Three times a day (TID) | Status: DC

## 2015-11-17 MED ORDER — LISINOPRIL 5 MG PO TABS: 5.0000 mg | ORAL_TABLET | Freq: Every day | ORAL | Status: DC

## 2015-11-17 MED FILL — metroNIDAZOLE 500 MG TABS: 500 | 7 days supply | Qty: 14 | Fill #0

## 2015-11-17 MED FILL — ATORVASTATIN 40 MG TABLET: 40 | 30 days supply | Qty: 30 | Fill #0

## 2015-11-17 MED FILL — LISINOPRIL 5 MG TABLET: 5 | 30 days supply | Qty: 30 | Fill #0

## 2015-11-17 MED FILL — LANTUS SOLOSTAR 100 UNITS/M: 100 | 20 days supply | Qty: 6 | Fill #0

## 2015-11-17 MED FILL — ACCU-CHEK AVIVA PLUS TEST S: 30 days supply | Qty: 100 | Fill #0

## 2015-11-17 MED FILL — NORTRIPTYLINE HCL 50 MG CAP: 50 | 30 days supply | Qty: 30 | Fill #0

## 2015-11-17 MED FILL — metFORMIN HCL 1000 MG TABS: 1000 | 30 days supply | Qty: 60 | Fill #0

## 2015-11-17 MED FILL — FLUCONAZOLE 150 MG TABLET: 150 | 1 days supply | Qty: 1 | Fill #0

## 2015-11-17 MED FILL — ACCU-CHEK SOFTCLIX LANCETS: 30 days supply | Qty: 100 | Fill #0

## 2015-11-17 MED FILL — ACCU-CHEK AVIVA PLUS METER: W/DEVICE | 30 days supply | Qty: 1 | Fill #0

## 2015-11-17 MED FILL — ULTICARE PEN NDL 4MM 32G: 32G X 4 MM | 30 days supply | Qty: 50 | Fill #0

## 2015-11-17 NOTE — Assessment & Plan Note (Signed)
A: HTN hx in 2014, normotensive now on lisinopril mostly for renal protection in diabetes P: Continue lisinopril, decrease to 5 mg daily

## 2015-11-17 NOTE — Assessment & Plan Note (Signed)
Vaginal discharge. Recent trich.  Plan: Wet prep GC/chlam  Retreat with flagyl followed by diflucan Condoms with sex

## 2015-11-17 NOTE — Progress Notes (Signed)
F/U DM HTN medicine refills  No medication x 1 week  Pt stated still with Sx, Dx trich  No pain today  No suicidal thought in the past two weeks

## 2015-11-17 NOTE — Patient Instructions (Addendum)
Robin Montgomery was seen today for diabetes and hypertension.  Diagnoses and all orders for this visit:  Diabetes mellitus type 2, insulin dependent (HCC) -     POCT glycosylated hemoglobin (Hb A1C) -     POCT glucose (manual entry) -     Microalbumin/Creatinine Ratio, Urine -     Insulin Glargine (LANTUS SOLOSTAR) 100 UNIT/ML Solostar Pen; Inject 30 Units into the skin daily at 10 pm. -     metFORMIN (GLUCOPHAGE) 1000 MG tablet; Take 1 tablet (1,000 mg total) by mouth 2 (two) times daily with a meal. -     sitaGLIPtin (JANUVIA) 100 MG tablet; Take 1 tablet (100 mg total) by mouth daily. -     Blood Glucose Monitoring Suppl (ACCU-CHEK AVIVA PLUS) w/Device KIT; 1 Device by Does not apply route 3 (three) times daily after meals. ICD 10 E11.9 -     glucose blood (ACCU-CHEK AVIVA PLUS) test strip; 1 each by Other route 3 (three) times daily. ICD 10 E11.9 -     Insulin Pen Needle (ULTICARE MICRO PEN NEEDLES) 32G X 4 MM MISC; 1 each by Does not apply route at bedtime. -     ACCU-CHEK SOFTCLIX LANCETS lancets; 1 each by Other route 3 (three) times daily. ICD 10 E11.9 -     atorvastatin (LIPITOR) 40 MG tablet; Take 1 tablet (40 mg total) by mouth daily. -     lisinopril (PRINIVIL,ZESTRIL) 5 MG tablet; Take 1 tablet (5 mg total) by mouth daily. For blood pressure  Depression -     nortriptyline (PAMELOR) 50 MG capsule; Take 1 capsule (50 mg total) by mouth at bedtime.  Vaginal discharge -     Cervicovaginal ancillary only -     fluconazole (DIFLUCAN) 150 MG tablet; Take 1 tablet (150 mg total) by mouth once. -     metroNIDAZOLE (FLAGYL) 500 MG tablet; Take 1 tablet (500 mg total) by mouth 2 (two) times daily.   Diabetes blood sugar goals  Fasting (in AM before breakfast, 8 hrs of no eating or drinking (except water or unsweetened coffee or tea): 90-110 2 hrs after meals: < 160,   No low sugars: nothing < 70   F/u in 6 weeks for blood sugar review  Dr. Adrian Blackwater

## 2015-11-17 NOTE — Progress Notes (Signed)
Subjective:  Patient ID: Robin Montgomery, female    DOB: 17-Apr-1961  Age: 55 y.o. MRN: [id number]  CC: Diabetes and Hypertension   HPI GARGI BERCH has HTN, DM2, depression, substance abuse presents for   1. DM2: she has been out of medication x 1 week. She has not been monitoring her blood sugars. She has depression and feels depressed most of the time. She is not taking her antidepressant.   2. HTN: she has out of lisinopril. She dose not have very high BP. No HA, CP, SOB or swelling.   3. Vaginal discharge: persistent. She was treated for trich in 05/2015. She is not sure if her partner has been treated. She has discharge. No lesions. No odor.   4. Depression: chronic. Most of her life. She has been treated at [facility] in the past but has not returned. She is not taking nortriptyline. She admits to substance abuse. She last used cocaine one week ago.   Social History  Substance Use Topics  . Smoking status: Current Every Day Smoker  . Smokeless tobacco: Not on file     Comment: states she cut back to 4 cigarettes/per day  . Alcohol Use: 0.0 oz/week    0 Standard drinks or equivalent per week     Comment: every other week     Outpatient Prescriptions Prior to Visit  Medication Sig Dispense Refill  . ACCU-CHEK AVIVA PLUS test strip USE AS DIRECTED BY PHYSICIAN 3 TIMES DAILY 100 each 12  . Blood Glucose Monitoring Suppl (ACCU-CHEK AVIVA PLUS) W/DEVICE KIT 1 Device by Does not apply route 3 (three) times daily after meals. 1 kit 0  . Insulin Glargine (LANTUS SOLOSTAR) 100 UNIT/ML Solostar Pen Inject 20 Units into the skin daily at 10 pm. 5 pen 3  . Insulin Pen Needle (ULTICARE MICRO PEN NEEDLES) 32G X 4 MM MISC 1 each by Does not apply route at bedtime. 100 each 2  . lisinopril (PRINIVIL,ZESTRIL) 10 MG tablet Take 1 tablet (10 mg total) by mouth daily. For blood pressure 30 tablet 5  . metFORMIN (GLUCOPHAGE) 1000 MG tablet Take 1 tablet (1,000 mg total) by mouth 2 (two) times  daily with a meal. 60 tablet 5  . nortriptyline (PAMELOR) 50 MG capsule Take 1 capsule (50 mg total) by mouth at bedtime. 30 capsule 1  . PROAIR HFA 108 (90 Base) MCG/ACT inhaler INHALE 1 PUFF INTO THE LUNGS EVERY 6 HOURS AS NEEDED FOR WHEEZING OR SHORTNESS OF BREATH. 8.5 g 0  . simvastatin (ZOCOR) 20 MG tablet Take 1 tablet (20 mg total) by mouth daily at 6 PM. For high cholesterol 30 tablet 5  . sitaGLIPtin (JANUVIA) 100 MG tablet Take 1 tablet (100 mg total) by mouth daily. 30 tablet 11  . terbinafine (LAMISIL) 250 MG tablet Take 1 tablet (250 mg total) by mouth daily. 30 tablet 2  . fluconazole (DIFLUCAN) 150 MG tablet Take 1 tablet (150 mg total) by mouth once. (Patient not taking: Reported on 11/17/2015) 1 tablet 0  . metroNIDAZOLE (FLAGYL) 500 MG tablet Take 1 tablet (500 mg total) by mouth 2 (two) times daily. (Patient not taking: Reported on 11/17/2015) 14 tablet 0   No facility-administered medications prior to visit.    ROS Review of Systems  Constitutional: Negative for fever and chills.  Eyes: Negative for visual disturbance.  Respiratory: Negative for shortness of breath.   Cardiovascular: Negative for chest pain.  Gastrointestinal: Negative for abdominal pain and blood in stool.  Genitourinary: Positive for vaginal discharge.  Musculoskeletal: Positive for arthralgias (feet and wrist ). Negative for back pain.  Skin: Negative for rash.  Allergic/Immunologic: Negative for immunocompromised state.  Hematological: Negative for adenopathy. Does not bruise/bleed easily.  Psychiatric/Behavioral: Positive for dysphoric mood. Negative for suicidal ideas.    Objective:  BP 108/73 mmHg  Pulse 86  Temp(Src) 97.7 F (36.5 C) (Oral)  Resp 16  Ht '5\' 2"'  (1.575 m)  Wt 149 lb (67.586 kg)  BMI 27.25 kg/m2  SpO2 96%  BP/Weight 11/17/2015 05/28/2015 1/76/1607  Systolic BP 371 97 062  Diastolic BP 73 63 80  Wt. (Lbs) 149 154 -  BMI 27.25 28.16 -   Physical Exam  Constitutional:  She appears well-developed and well-nourished. No distress.  Cardiovascular: Normal rate, regular rhythm, normal heart sounds and intact distal pulses.   Pulmonary/Chest: Effort normal and breath sounds normal.  Genitourinary: Uterus normal. Pelvic exam was performed with patient prone. There is no rash, tenderness or lesion on the right labia. There is no rash, tenderness or lesion on the left labia. Cervix exhibits no motion tenderness, no discharge and no friability. Vaginal discharge found.  Thin with vaginal discharge, moderate in amount   Musculoskeletal: She exhibits no edema.  Lymphadenopathy:       Right: No inguinal adenopathy present.       Left: No inguinal adenopathy present.  Skin: Skin is warm and dry. No rash noted.   Lab Results  Component Value Date   HGBA1C 9.70 05/28/2015   CBG 226   Depression screen [facility] 2/9 11/17/2015 05/28/2015 12/22/2014 04/01/2014  Decreased Interest '3 3 2 3  ' Down, Depressed, Hopeless '3 3 2 3  ' PHQ - 2 Score '6 6 4 6  ' Altered sleeping 3 3 0 3  Tired, decreased energy '3 3 3 3  ' Change in appetite '3 3 3 2  ' Feeling bad or failure about yourself  '3 3 1 3  ' Trouble concentrating '3 3 1 3  ' Moving slowly or fidgety/restless '2 3 2 3  ' Suicidal thoughts '2 2 1 2  ' PHQ-9 Score '25 26 15 25    ' GAD 7 : Generalized Anxiety Score 11/17/2015 05/28/2015  Nervous, Anxious, on Edge 3 3  Control/stop worrying 3 3  Worry too much - different things 3 3  Trouble relaxing 3 3  Restless 3 3  Easily annoyed or irritable 3 3  Afraid - awful might happen 3 3  Total GAD 7 Score 21 21    Assessment & Plan:   Jerald was seen today for diabetes and hypertension.  Diagnoses and all orders for this visit:  Diabetes mellitus type 2, insulin dependent (HCC) -     POCT glycosylated hemoglobin (Hb A1C) -     POCT glucose (manual entry) -     Microalbumin/Creatinine Ratio, Urine -     Insulin Glargine (LANTUS SOLOSTAR) 100 UNIT/ML Solostar Pen; Inject 30 Units into the  skin daily at 10 pm. -     metFORMIN (GLUCOPHAGE) 1000 MG tablet; Take 1 tablet (1,000 mg total) by mouth 2 (two) times daily with a meal. -     sitaGLIPtin (JANUVIA) 100 MG tablet; Take 1 tablet (100 mg total) by mouth daily. -     Blood Glucose Monitoring Suppl (ACCU-CHEK AVIVA PLUS) w/Device KIT; 1 Device by Does not apply route 3 (three) times daily after meals. ICD 10 E11.9 -     glucose blood (ACCU-CHEK AVIVA PLUS) test strip; 1 each by Other route  3 (three) times daily. ICD 10 E11.9 -     Insulin Pen Needle (ULTICARE MICRO PEN NEEDLES) 32G X 4 MM MISC; 1 each by Does not apply route at bedtime. -     ACCU-CHEK SOFTCLIX LANCETS lancets; 1 each by Other route 3 (three) times daily. ICD 10 E11.9 -     atorvastatin (LIPITOR) 40 MG tablet; Take 1 tablet (40 mg total) by mouth daily. -     lisinopril (PRINIVIL,ZESTRIL) 5 MG tablet; Take 1 tablet (5 mg total) by mouth daily. For blood pressure  Depression -     nortriptyline (PAMELOR) 50 MG capsule; Take 1 capsule (50 mg total) by mouth at bedtime.  Vaginal discharge -     Cervicovaginal ancillary only -     fluconazole (DIFLUCAN) 150 MG tablet; Take 1 tablet (150 mg total) by mouth once. -     metroNIDAZOLE (FLAGYL) 500 MG tablet; Take 1 tablet (500 mg total) by mouth 2 (two) times daily.  Essential hypertension   No orders of the defined types were placed in this encounter.    Follow-up: Return in about 6 weeks (around 12/29/2015) for CBG review .   Boykin Nearing MD

## 2015-11-17 NOTE — Assessment & Plan Note (Signed)
Uncontrolled DM2 with rising A1c  Plan: Increase lantus to 30 U daily  Continue metformin 1000 mg BID Continue januvia 100 mg daily

## 2015-11-18 LAB — MICROALBUMIN / CREATININE URINE RATIO
Creatinine, Urine: 114 mg/dL (ref 20–320)
Microalb Creat Ratio: 5 mcg/mg creat (ref <30)
Microalb, Ur: 0.6 mg/dL

## 2015-11-19 LAB — CERVICOVAGINAL ANCILLARY ONLY
Chlamydia: NEGATIVE
Neisseria Gonorrhea: NEGATIVE
Wet Prep (BD Affirm): POSITIVE — AB

## 2015-11-24 ENCOUNTER — Telehealth: Payer: Self-pay | Admitting: *Deleted

## 2015-11-24 NOTE — Telephone Encounter (Signed)
-----   Message from Dessa PhiJosalyn Funches, MD sent at 11/19/2015 11:49 AM EDT ----- Negative screening Gc/chlam

## 2015-11-24 NOTE — Telephone Encounter (Signed)
-----   Message from Dessa PhiJosalyn Funches, MD sent at 11/18/2015  3:55 PM EDT ----- Urine microalbumin normal

## 2015-11-25 NOTE — Telephone Encounter (Signed)
Unable to contact pt  No voice mail  

## 2016-01-20 NOTE — Progress Notes (Signed)
ERROR

## 2016-03-22 ENCOUNTER — Ambulatory Visit: Payer: Self-pay | Admitting: Family Medicine

## 2016-03-25 ENCOUNTER — Ambulatory Visit: Payer: Self-pay | Admitting: Family Medicine

## 2016-05-06 ENCOUNTER — Ambulatory Visit: Payer: Self-pay | Admitting: Family Medicine

## 2016-05-24 ENCOUNTER — Other Ambulatory Visit: Payer: Self-pay | Admitting: Family Medicine

## 2016-05-24 DIAGNOSIS — N898 Other specified noninflammatory disorders of vagina: Secondary | ICD-10-CM

## 2016-05-24 MED FILL — LISINOPRIL 5 MG TABLET: 5 | 30 days supply | Qty: 30 | Fill #1

## 2016-05-24 MED FILL — JANUVIA 100 MG TABLET: 100 | 30 days supply | Qty: 30 | Fill #0

## 2016-05-24 MED FILL — FLUCONAZOLE 150 MG TABLET: 150 | 1 days supply | Qty: 1 | Fill #0

## 2016-05-24 MED FILL — metFORMIN HCL 1000 MG TABS: 1000 | 30 days supply | Qty: 60 | Fill #1

## 2016-05-24 MED FILL — NORTRIPTYLINE HCL 50 MG CAP: 50 | 30 days supply | Qty: 30 | Fill #1

## 2016-05-24 MED FILL — LANTUS SOLOSTAR 100 UNITS/M: 100 | 30 days supply | Qty: 9 | Fill #1

## 2016-05-24 MED FILL — ATORVASTATIN 40 MG TABLET: 40 | 30 days supply | Qty: 30 | Fill #1

## 2016-05-24 MED FILL — metroNIDAZOLE 500 MG TABS: 500 | 7 days supply | Qty: 14 | Fill #0

## 2016-05-24 MED FILL — PROAIR HFA 90 MCG INHALER: 108 (90 BAS | 30 days supply | Qty: 9 | Fill #1

## 2016-06-09 ENCOUNTER — Ambulatory Visit: Payer: Self-pay | Admitting: Family Medicine

## 2016-06-17 ENCOUNTER — Ambulatory Visit: Payer: Self-pay | Admitting: Family Medicine

## 2016-07-18 ENCOUNTER — Other Ambulatory Visit: Payer: Self-pay | Admitting: Family Medicine

## 2016-07-18 MED FILL — JANUVIA 100 MG TABLET: 100 | 30 days supply | Qty: 30 | Fill #1

## 2016-07-18 MED FILL — LANTUS SOLOSTAR 100 UNITS/M: 100 | 30 days supply | Qty: 9 | Fill #2

## 2016-07-18 MED FILL — ULTICARE PEN NDL 4MM 32G: 32G X 4 MM | 30 days supply | Qty: 50 | Fill #1

## 2016-07-18 MED FILL — metFORMIN HCL 1000 MG TABS: 1000 | 30 days supply | Qty: 60 | Fill #2

## 2016-07-18 MED FILL — LISINOPRIL 5 MG TABLET: 5 | 30 days supply | Qty: 30 | Fill #2

## 2016-07-18 MED FILL — ATORVASTATIN 40 MG TABLET: 40 | 30 days supply | Qty: 30 | Fill #2

## 2016-07-18 MED FILL — NORTRIPTYLINE HCL 50 MG CAP: 50 | 30 days supply | Qty: 30 | Fill #2

## 2016-07-18 MED FILL — PROAIR HFA 90 MCG INHALER: 108 (90 BAS | 30 days supply | Qty: 9 | Fill #0

## 2016-07-28 ENCOUNTER — Encounter: Payer: Self-pay | Admitting: Licensed Clinical Social Worker

## 2016-07-28 ENCOUNTER — Encounter: Payer: Self-pay | Admitting: Family Medicine

## 2016-07-28 ENCOUNTER — Ambulatory Visit: Payer: Medicaid Other | Attending: Family Medicine | Admitting: Family Medicine

## 2016-07-28 VITALS — BP 126/76 | HR 93 | Temp 98.7°F | Resp 18 | Ht 63.5 in | Wt 141.2 lb

## 2016-07-28 DIAGNOSIS — E119 Type 2 diabetes mellitus without complications: Secondary | ICD-10-CM | POA: Diagnosis not present

## 2016-07-28 DIAGNOSIS — F1490 Cocaine use, unspecified, uncomplicated: Secondary | ICD-10-CM | POA: Diagnosis not present

## 2016-07-28 DIAGNOSIS — Z79899 Other long term (current) drug therapy: Secondary | ICD-10-CM | POA: Insufficient documentation

## 2016-07-28 DIAGNOSIS — M25562 Pain in left knee: Secondary | ICD-10-CM | POA: Diagnosis not present

## 2016-07-28 DIAGNOSIS — F17200 Nicotine dependence, unspecified, uncomplicated: Secondary | ICD-10-CM

## 2016-07-28 DIAGNOSIS — F329 Major depressive disorder, single episode, unspecified: Secondary | ICD-10-CM | POA: Diagnosis not present

## 2016-07-28 DIAGNOSIS — F324 Major depressive disorder, single episode, in partial remission: Secondary | ICD-10-CM | POA: Diagnosis not present

## 2016-07-28 DIAGNOSIS — R0602 Shortness of breath: Secondary | ICD-10-CM

## 2016-07-28 DIAGNOSIS — I10 Essential (primary) hypertension: Secondary | ICD-10-CM | POA: Insufficient documentation

## 2016-07-28 DIAGNOSIS — Z794 Long term (current) use of insulin: Secondary | ICD-10-CM | POA: Diagnosis not present

## 2016-07-28 DIAGNOSIS — F17210 Nicotine dependence, cigarettes, uncomplicated: Secondary | ICD-10-CM | POA: Diagnosis not present

## 2016-07-28 DIAGNOSIS — F1410 Cocaine abuse, uncomplicated: Secondary | ICD-10-CM

## 2016-07-28 LAB — CBC
HCT: 38.4 % (ref 35.0–45.0)
Hemoglobin: 12.8 g/dL (ref 11.7–15.5)
MCH: 31.3 pg (ref 27.0–33.0)
MCHC: 33.3 g/dL (ref 32.0–36.0)
MCV: 93.9 fL (ref 80.0–100.0)
MPV: 10 fL (ref 7.5–12.5)
Platelets: 451 10*3/uL — ABNORMAL HIGH (ref 140–400)
RBC: 4.09 MIL/uL (ref 3.80–5.10)
RDW: 13.6 % (ref 11.0–15.0)
WBC: 5.9 10*3/uL (ref 3.8–10.8)

## 2016-07-28 LAB — POCT URINALYSIS DIPSTICK
Bilirubin, UA: NEGATIVE
Blood, UA: NEGATIVE
Glucose, UA: 500
Ketones, UA: NEGATIVE
Leukocytes, UA: NEGATIVE
Nitrite, UA: NEGATIVE
Protein, UA: NEGATIVE
Spec Grav, UA: 1.02
Urobilinogen, UA: 1
pH, UA: 6

## 2016-07-28 LAB — GLUCOSE, POCT (MANUAL RESULT ENTRY)
POC Glucose: 278 mg/dl — AB (ref 70–99)
POC Glucose: 134 mg/dl — AB (ref 70–99)

## 2016-07-28 LAB — POCT GLYCOSYLATED HEMOGLOBIN (HGB A1C): Hemoglobin A1C: 9.2

## 2016-07-28 MED ORDER — LISINOPRIL 5 MG PO TABS: 5.0000 mg | ORAL_TABLET | Freq: Every day | ORAL | 3 refills | Status: DC

## 2016-07-28 MED ORDER — INSULIN GLARGINE 100 UNIT/ML SOLOSTAR PEN: 30.0000 [IU] | PEN_INJECTOR | Freq: Every day | SUBCUTANEOUS | 3 refills | Status: DC

## 2016-07-28 MED ORDER — METFORMIN HCL 1000 MG PO TABS: 1000.0000 mg | ORAL_TABLET | Freq: Two times a day (BID) | ORAL | 3 refills | Status: DC

## 2016-07-28 MED ORDER — SITAGLIPTIN PHOSPHATE 100 MG PO TABS: 100.0000 mg | ORAL_TABLET | Freq: Every day | ORAL | 3 refills | Status: DC

## 2016-07-28 MED ORDER — NORTRIPTYLINE HCL 50 MG PO CAPS: 50.0000 mg | ORAL_CAPSULE | Freq: Every day | ORAL | 2 refills | Status: DC

## 2016-07-28 MED ORDER — INSULIN ASPART 100 UNIT/ML ~~LOC~~ SOLN
10.0000 [IU] | Freq: Once | SUBCUTANEOUS | Status: AC
  Administered 2016-07-28: 10 [IU] via SUBCUTANEOUS

## 2016-07-28 MED ORDER — METHYLPREDNISOLONE ACETATE 40 MG/ML IJ SUSP
40.0000 mg | Freq: Once | INTRAMUSCULAR | Status: AC
  Administered 2016-07-28: 40 mg via INTRAMUSCULAR

## 2016-07-28 MED ORDER — ALBUTEROL SULFATE HFA 108 (90 BASE) MCG/ACT IN AERS: INHALATION_SPRAY | RESPIRATORY_TRACT | 5 refills | Status: DC

## 2016-07-28 NOTE — Progress Notes (Signed)
Patient is here for DM FU  Patient complains of left knee pain being present for the past week. Patient complains of SOB with exertion increasing over the past month.  Patient has not taken medication today. Patient has eaten today  Patient tolerated 10 units of insulin well today.

## 2016-07-28 NOTE — BH Specialist Note (Signed)
Session Start time: 3:00 PM   End Time: 3:30 PM Total Time:  30 minutes Type of Service: Behavioral Health - Individual/Family Interpreter: No.   Interpreter Name & Language: N/A # [facility]   SUBJECTIVE: Robin MeadCynthia V Montgomery is a 56 y.o. female  Pt. was referred by Dr. Armen PickupFunches for:  anxiety and depression. Pt. reports the following symptoms/concerns: overwhelming feelings of sadness and worry, difficulty sleeping, withdrawn behavior, irritability, wavering appetite, racing thoughts, and suicidal ideations Duration of problem:  "since I was a young girl" Severity: severe Previous treatment: Pt has hx receiving [facility] and [facility]. She has not received services in a year.   OBJECTIVE: Mood: Anxious & Affect: Appropriate Risk of harm to self or others: Pt has hx of suicidal ideations and suicide attempts (approx 4-5) Pt reported that she has hx of command audio hallucinations noting last occurrence was a week ago. Pt denies plan or intent to self-harm and/or harm others Assessments administered: PHQ-9; GAD-7  LIFE CONTEXT:  Family & Social: Pt currently stays in a hotel with her 74seventeen year old son, 75twenty year old daughter, and her son's father. She receives emotional and financial support from her boyfriend who does not reside in the home School/ Work: Pt [place] [employer]; however, notes that it will soon lapse. She does not receive any additional public benefits Self-Care: Pt receives emotional support from her pet dog. She actively uses substances (crack) once a week Life changes: Pt is chronically homeless noting that she has resided in hotels for the past ten years. She is experiencing financial strain and actively using substances What is important to pt/family (values): Family, Independence    GOALS ADDRESSED:  Decrease symptoms of depression Decrease symptoms of anxiety Increase knowledge of coping  skills  INTERVENTIONS: Solution Focused, Strength-based and Supportive   ASSESSMENT:  Pt currently experiencing depression and anxiety triggered by chronic homelessness, active substance use, and financial strain. Pt reports overwhelming feelings of sadness and worry, difficulty sleeping, withdrawn behavior, irritability, wavering appetite, racing thoughts, and suicidal ideations. She has not received any behavioral health services in over a year. Pt may benefit from psychotherapy, medication, and substance use treatment. LCSWA educated pt on the cycle of depression and anxiety and discussed how substance use can negatively impact pt's mental and physical health. Pt and LCSWA discussed protective factors, in addition, to realistic healthy coping skills that can assist in a decrease symptoms. Pt has a safety plan and was open to re-initiating therapy and medication management. She was provided resources on crisis intervention, substance use treatment, and agencies that will accept uninsured patients, in the case her medicaid lapses.      PLAN: 1. F/U with behavioral health clinician: Pt was encouraged to contact LCSWA if symptoms worsen or fail to improve to schedule [facility]. 2. Behavioral Health meds: none reported 3. Behavioral recommendations: LCSWA recommends that pt apply healthy coping skills discussed, re-initiate behavioral health services, and participate in substance treatment. Pt is encouraged to schedule follow up appointment with LCSWA 4. Referral: [facility]ing[facility]otherapy, [facility], Problem-solving teaching/coping strategies, Psychoeducation and Supportive Counseling 5. From scale of 1-10, how likely are you to follow plan: 7/10   Bridgett LarssonJasmine D Lewis, MSW, [facility]SWA  Clinical Social Worker 07/29/2016 4:57 PM  Warmhandoff:   Warm Hand Off Completed.

## 2016-07-28 NOTE — Assessment & Plan Note (Signed)
Active use Internal referral to clinical social worker for substance abuse resources

## 2016-07-28 NOTE — Patient Instructions (Addendum)
Robin BeechamCynthia was seen today for diabetes.  Diagnoses and all orders for this visit:  Diabetes mellitus type 2, insulin dependent (HCC) -     POCT glycosylated hemoglobin (Hb A1C) -     Glucose (CBG) -     insulin aspart (novoLOG) injection 10 Units; Inject 0.1 mLs (10 Units total) into the skin once. -     Insulin Glargine (LANTUS SOLOSTAR) 100 UNIT/ML Solostar Pen; Inject 30 Units into the skin daily at 10 pm. -     metFORMIN (GLUCOPHAGE) 1000 MG tablet; Take 1 tablet (1,000 mg total) by mouth 2 (two) times daily with a meal. -     sitaGLIPtin (JANUVIA) 100 MG tablet; Take 1 tablet (100 mg total) by mouth daily. -     lisinopril (PRINIVIL,ZESTRIL) 5 MG tablet; Take 1 tablet (5 mg total) by mouth daily. For blood pressure  Major depressive disorder with single episode, in partial remission (HCC) -     nortriptyline (PAMELOR) 50 MG capsule; Take 1 capsule (50 mg total) by mouth at bedtime.  Smoker -     albuterol (PROAIR HFA) 108 (90 Base) MCG/ACT inhaler; INHALE 1 PUFF INTO THE LUNGS EVERY 6 HOURS AS NEEDED FOR WHEEZING OR SHORTNESS OF BREATH.  Shortness of breath -     CBC -     DG Chest 2 View; Future  Left medial knee pain -     DG Knee AP/LAT W/Sunrise Left; Future  Cocaine abuse  Diabetes blood sugar goals  Fasting (in AM before breakfast, 8 hrs of no eating or drinking (except water or unsweetened coffee or tea): 90-110 2 hrs after meals: < 160,   No low sugars: nothing < 70   F/u in 4 weeks for CBG review with clinical pharmacologist  F/u with me in 3 months for diabetes   Dr. Armen PickupFunches

## 2016-07-28 NOTE — Progress Notes (Signed)
Subjective:  Patient ID: Robin Montgomery, female    DOB: January 13, 1961  Age: 56 y.o. MRN: [id number]  CC: Diabetes   HPI Robin Montgomery has HTN, DM2, depression, substance abuse presents for   1. DM2: she has been out of medication x 1 month. She has not been monitoring her blood sugars. When she had all of her medicines she was doing well.   2. HTN: she has out of lisinopril. She dose not have very high BP. No HA, CP, SOB or swelling.   3. L knee pain: medial knee. Pain x one week. Was swelling and swelling improved with wrapping with ace bandage. No redness. No similar pain in the past. Current level of pain in knee is 7/10.   4. Shortness of breath: x one month. She smokes 4 cigs per day. She uses cocaine. Last use was 4 days go. Denies chest pain and cough. She has itching in her throat.    Social History  Substance Use Topics  . Smoking status: Current Every Day Smoker  . Smokeless tobacco: Not on file     Comment: states she cut back to 4 cigarettes/per day  . Alcohol use 0.0 oz/week     Comment: every other week     Outpatient Medications Prior to Visit  Medication Sig Dispense Refill  . ACCU-CHEK SOFTCLIX LANCETS lancets 1 each by Other route 3 (three) times daily. ICD 10 E11.9 100 each 12  . atorvastatin (LIPITOR) 40 MG tablet Take 1 tablet (40 mg total) by mouth daily. 90 tablet 3  . Blood Glucose Monitoring Suppl (ACCU-CHEK AVIVA PLUS) w/Device KIT 1 Device by Does not apply route 3 (three) times daily after meals. ICD 10 E11.9 1 kit 0  . fluconazole (DIFLUCAN) 150 MG tablet TAKE 1 TABLET BY MOUTH ONCE. 1 tablet 0  . glucose blood (ACCU-CHEK AVIVA PLUS) test strip 1 each by Other route 3 (three) times daily. ICD 10 E11.9 100 each 12  . Insulin Glargine (LANTUS SOLOSTAR) 100 UNIT/ML Solostar Pen Inject 30 Units into the skin daily at 10 pm. 5 pen 3  . Insulin Pen Needle (ULTICARE MICRO PEN NEEDLES) 32G X 4 MM MISC 1 each by Does not apply route at bedtime. 100 each 2    . lisinopril (PRINIVIL,ZESTRIL) 5 MG tablet Take 1 tablet (5 mg total) by mouth daily. For blood pressure 90 tablet 3  . metFORMIN (GLUCOPHAGE) 1000 MG tablet Take 1 tablet (1,000 mg total) by mouth 2 (two) times daily with a meal. 180 tablet 3  . metroNIDAZOLE (FLAGYL) 500 MG tablet TAKE 1 TABLET (500 MG TOTAL) BY MOUTH 2 (TWO) TIMES DAILY. 14 tablet 0  . nortriptyline (PAMELOR) 50 MG capsule Take 1 capsule (50 mg total) by mouth at bedtime. 90 capsule 2  . PROAIR HFA 108 (90 Base) MCG/ACT inhaler INHALE 1 PUFF INTO THE LUNGS EVERY 6 HOURS AS NEEDED FOR WHEEZING OR SHORTNESS OF BREATH. 8.5 g 0  . sitaGLIPtin (JANUVIA) 100 MG tablet Take 1 tablet (100 mg total) by mouth daily. 90 tablet 3  . terbinafine (LAMISIL) 250 MG tablet Take 1 tablet (250 mg total) by mouth daily. 30 tablet 2   No facility-administered medications prior to visit.     ROS Review of Systems  Constitutional: Negative for chills and fever.  Eyes: Negative for visual disturbance.  Respiratory: Positive for shortness of breath.   Cardiovascular: Negative for chest pain.  Gastrointestinal: Negative for abdominal pain and blood in stool.  Genitourinary: Negative for vaginal discharge.  Musculoskeletal: Positive for arthralgias (left knee ). Negative for back pain.  Skin: Negative for rash.  Allergic/Immunologic: Negative for immunocompromised state.  Hematological: Negative for adenopathy. Does not bruise/bleed easily.  Psychiatric/Behavioral: Positive for dysphoric mood. Negative for suicidal ideas.    Objective:  BP 126/76 (BP Location: Right Arm, Patient Position: Sitting, Cuff Size: Normal)   Pulse 93   Temp 98.7 F (37.1 C) (Oral)   Resp 18   Ht 5' 3.5" (1.613 m)   Wt 141 lb 3.2 oz (64 kg)   SpO2 100%   BMI 24.62 kg/m   BP/Weight 07/28/2016 11/17/2015 41/93/7902  Systolic BP 409 735 97  Diastolic BP 76 73 63  Wt. (Lbs) 141.2 149 154  BMI 24.62 27.25 28.16  Some encounter information is confidential and  restricted. Go to Review Flowsheets activity to see all data.   Physical Exam  Constitutional: She appears well-developed and well-nourished. No distress.  Cardiovascular: Normal rate, regular rhythm, normal heart sounds and intact distal pulses.   Pulmonary/Chest: Effort normal and breath sounds normal.  Genitourinary: Pelvic exam was performed with patient prone. Cervix exhibits no motion tenderness, no discharge and no friability.  Musculoskeletal: She exhibits no edema.  Skin: Skin is warm and dry. No rash noted.   Lab Results  Component Value Date   HGBA1C 11.4 11/17/2015   Lab Results  Component Value Date   HGBA1C 9.2 07/28/2016    CBG 278  After obtaining informed consent and cleaning the skin using iodine and alcohol a  steroid injection was performed at L knee.  Using 1:1 , 1 ml of  1% plain Lidocaine and 40 mg/ml of Depo Medrol. This was well tolerated.    Depression screen [facility] 2/9 11/17/2015 05/28/2015 12/22/2014 04/01/2014  Decreased Interest '3 3 2 3  ' Down, Depressed, Hopeless '3 3 2 3  ' PHQ - 2 Score '6 6 4 6  ' Altered sleeping 3 3 0 3  Tired, decreased energy '3 3 3 3  ' Change in appetite '3 3 3 2  ' Feeling bad or failure about yourself  '3 3 1 3  ' Trouble concentrating '3 3 1 3  ' Moving slowly or fidgety/restless '2 3 2 3  ' Suicidal thoughts '2 2 1 2  ' PHQ-9 Score '25 26 15 25    ' GAD 7 : Generalized Anxiety Score 11/17/2015 05/28/2015  Nervous, Anxious, on Edge 3 3  Control/stop worrying 3 3  Worry too much - different things 3 3  Trouble relaxing 3 3  Restless 3 3  Easily annoyed or irritable 3 3  Afraid - awful might happen 3 3  Total GAD 7 Score 21 21    Assessment & Plan:   Robin Montgomery was seen today for diabetes.  Diagnoses and all orders for this visit:  Diabetes mellitus type 2, insulin dependent (HCC) -     POCT glycosylated hemoglobin (Hb A1C) -     Glucose (CBG) -     insulin aspart (novoLOG) injection 10 Units; Inject 0.1 mLs (10 Units total) into the skin  once. -     Insulin Glargine (LANTUS SOLOSTAR) 100 UNIT/ML Solostar Pen; Inject 30 Units into the skin daily at 10 pm. -     metFORMIN (GLUCOPHAGE) 1000 MG tablet; Take 1 tablet (1,000 mg total) by mouth 2 (two) times daily with a meal. -     sitaGLIPtin (JANUVIA) 100 MG tablet; Take 1 tablet (100 mg total) by mouth daily. -  lisinopril (PRINIVIL,ZESTRIL) 5 MG tablet; Take 1 tablet (5 mg total) by mouth daily. For blood pressure -     Urinalysis Dipstick -     POCT glucose (manual entry)  Major depressive disorder with single episode, in partial remission (HCC) -     nortriptyline (PAMELOR) 50 MG capsule; Take 1 capsule (50 mg total) by mouth at bedtime.  Smoker -     albuterol (PROAIR HFA) 108 (90 Base) MCG/ACT inhaler; INHALE 1 PUFF INTO THE LUNGS EVERY 6 HOURS AS NEEDED FOR WHEEZING OR SHORTNESS OF BREATH.  Shortness of breath -     CBC -     DG Chest 2 View; Future  Left medial knee pain -     DG Knee AP/LAT W/Sunrise Left; Future -     methylPREDNISolone acetate (DEPO-MEDROL) injection 40 mg; Inject 1 mL (40 mg total) into the muscle once.  Cocaine abuse   Meds ordered this encounter  Medications  . insulin aspart (novoLOG) injection 10 Units  . Insulin Glargine (LANTUS SOLOSTAR) 100 UNIT/ML Solostar Pen    Sig: Inject 30 Units into the skin daily at 10 pm.    Dispense:  5 pen    Refill:  3  . metFORMIN (GLUCOPHAGE) 1000 MG tablet    Sig: Take 1 tablet (1,000 mg total) by mouth 2 (two) times daily with a meal.    Dispense:  180 tablet    Refill:  3  . sitaGLIPtin (JANUVIA) 100 MG tablet    Sig: Take 1 tablet (100 mg total) by mouth daily.    Dispense:  90 tablet    Refill:  3  . lisinopril (PRINIVIL,ZESTRIL) 5 MG tablet    Sig: Take 1 tablet (5 mg total) by mouth daily. For blood pressure    Dispense:  90 tablet    Refill:  3  . albuterol (PROAIR HFA) 108 (90 Base) MCG/ACT inhaler    Sig: INHALE 1 PUFF INTO THE LUNGS EVERY 6 HOURS AS NEEDED FOR WHEEZING OR  SHORTNESS OF BREATH.    Dispense:  8.5 g    Refill:  5  . nortriptyline (PAMELOR) 50 MG capsule    Sig: Take 1 capsule (50 mg total) by mouth at bedtime.    Dispense:  90 capsule    Refill:  2  . methylPREDNISolone acetate (DEPO-MEDROL) injection 40 mg    Follow-up: Return in about 4 weeks (around 08/25/2016) for CBG check .   Boykin Nearing MD

## 2016-07-31 DIAGNOSIS — M25562 Pain in left knee: Secondary | ICD-10-CM | POA: Insufficient documentation

## 2016-07-31 NOTE — Assessment & Plan Note (Signed)
Improved Refilled medications Continue current regimen CBG monitoring Close f/u for recheck

## 2016-07-31 NOTE — Assessment & Plan Note (Signed)
Left knee pain Steroid shot for pain relief X-ray of knee

## 2016-08-03 ENCOUNTER — Telehealth: Payer: Self-pay

## 2016-08-03 NOTE — Telephone Encounter (Signed)
Pt was called and informed of lab results. 

## 2016-10-19 ENCOUNTER — Encounter: Payer: Self-pay | Admitting: Family Medicine

## 2016-11-21 MED FILL — LISINOPRIL 5 MG TAB: 5 | 30 days supply | Qty: 30 | Fill #0

## 2016-11-21 MED FILL — metFORMIN HCL 1000 MG TABS: 1000 | 30 days supply | Qty: 60 | Fill #0

## 2016-11-21 MED FILL — NORTRIPTYLINE HCL 50 MG CAP: 50 | 30 days supply | Qty: 30 | Fill #0

## 2016-11-21 MED FILL — LANTUS SOLOSTAR 100 UNITS/M: 100 | 30 days supply | Qty: 9 | Fill #0

## 2016-11-21 MED FILL — PROAIR HFA 90 MCG INHALER: 108 (90 BAS | 30 days supply | Qty: 9 | Fill #0

## 2016-11-21 MED FILL — JANUVIA 100 MG TABLET: 100 | 30 days supply | Qty: 30 | Fill #0

## 2017-02-09 ENCOUNTER — Encounter (HOSPITAL_COMMUNITY): Payer: Self-pay | Admitting: Emergency Medicine

## 2017-02-09 ENCOUNTER — Emergency Department (HOSPITAL_COMMUNITY)
Admission: EM | Admit: 2017-02-09 | Discharge: 2017-02-10 | Disposition: A | Discharge status: Home / Self Care | Payer: Self-pay | Attending: Emergency Medicine | Admitting: Emergency Medicine

## 2017-02-09 DIAGNOSIS — I10 Essential (primary) hypertension: Secondary | ICD-10-CM | POA: Insufficient documentation

## 2017-02-09 DIAGNOSIS — R079 Chest pain, unspecified: Secondary | ICD-10-CM

## 2017-02-09 DIAGNOSIS — F1410 Cocaine abuse, uncomplicated: Secondary | ICD-10-CM | POA: Insufficient documentation

## 2017-02-09 DIAGNOSIS — N898 Other specified noninflammatory disorders of vagina: Secondary | ICD-10-CM

## 2017-02-09 DIAGNOSIS — E119 Type 2 diabetes mellitus without complications: Secondary | ICD-10-CM | POA: Insufficient documentation

## 2017-02-09 DIAGNOSIS — Z79899 Other long term (current) drug therapy: Secondary | ICD-10-CM | POA: Insufficient documentation

## 2017-02-09 DIAGNOSIS — F329 Major depressive disorder, single episode, unspecified: Secondary | ICD-10-CM | POA: Insufficient documentation

## 2017-02-09 DIAGNOSIS — Z794 Long term (current) use of insulin: Secondary | ICD-10-CM | POA: Insufficient documentation

## 2017-02-09 DIAGNOSIS — A599 Trichomoniasis, unspecified: Secondary | ICD-10-CM

## 2017-02-09 DIAGNOSIS — M25562 Pain in left knee: Secondary | ICD-10-CM | POA: Insufficient documentation

## 2017-02-09 DIAGNOSIS — Z9049 Acquired absence of other specified parts of digestive tract: Secondary | ICD-10-CM | POA: Insufficient documentation

## 2017-02-09 DIAGNOSIS — G8929 Other chronic pain: Secondary | ICD-10-CM

## 2017-02-09 DIAGNOSIS — F17210 Nicotine dependence, cigarettes, uncomplicated: Secondary | ICD-10-CM | POA: Insufficient documentation

## 2017-02-09 LAB — CBG MONITORING, ED: Glucose-Capillary: 166 mg/dL — ABNORMAL HIGH (ref 65–99)

## 2017-02-09 LAB — URINALYSIS, ROUTINE W REFLEX MICROSCOPIC
Bilirubin Urine: NEGATIVE
Glucose, UA: 150 mg/dL — AB
Hgb urine dipstick: NEGATIVE
Ketones, ur: NEGATIVE mg/dL
Nitrite: NEGATIVE
Protein, ur: 30 mg/dL — AB
Specific Gravity, Urine: 1.027 (ref 1.005–1.030)
pH: 5 (ref 5.0–8.0)

## 2017-02-09 MED ORDER — ONDANSETRON 4 MG PO TBDP
4.0000 mg | ORAL_TABLET | Freq: Once | ORAL | Status: AC
  Administered 2017-02-10: 4 mg via ORAL
  Filled 2017-02-09: qty 1

## 2017-02-09 MED ORDER — CEFTRIAXONE SODIUM 250 MG IJ SOLR
250.0000 mg | Freq: Once | INTRAMUSCULAR | Status: AC
  Administered 2017-02-10: 250 mg via INTRAMUSCULAR
  Filled 2017-02-09: qty 250

## 2017-02-09 MED ORDER — METRONIDAZOLE 500 MG PO TABS
2000.0000 mg | ORAL_TABLET | Freq: Once | ORAL | Status: AC
  Administered 2017-02-10: 2000 mg via ORAL
  Filled 2017-02-09 (×2): qty 4

## 2017-02-09 MED ORDER — AZITHROMYCIN 250 MG PO TABS
1000.0000 mg | ORAL_TABLET | Freq: Once | ORAL | Status: AC
  Administered 2017-02-10: 1000 mg via ORAL
  Filled 2017-02-09: qty 4

## 2017-02-09 NOTE — ED Notes (Signed)
Patient moved from [address] to [address]

## 2017-02-09 NOTE — ED Provider Notes (Signed)
[place] DEPT Provider Note   CSN: [id number] Arrival date & time: 02/09/17  1911     History   Chief Complaint Chief Complaint  Patient presents with  . Vaginal Discharge  . Leg Pain    HPI Robin Montgomery is a 56 y.o. female.  56 yo F with multiple complaints. Patient had chest pain that lasted for about an hour or so earlier today. She describes it as sharp. She is not sure of it better or worse. It resolved spontaneously. This was about 8 hours ago. She had no shortness of breath with it no diaphoresis no vomiting or nausea. Denied radiation of the pain.  Patient is also complaining of left medial knee pain is been going on for the past month or longer. She was supposed to see her primary care physician but was unable to make it to her appointment. Worse with movement palpation. Denies fevers or swelling. Denies trauma.  Patient also has a new sexual partner and she is complaining of vaginal discharge. She denies likelihood being pregnant.   The history is provided by the patient.  Illness  This is a new problem. The current episode started more than 1 week ago. The problem occurs constantly. The problem has not changed since onset.Associated symptoms include chest pain. Pertinent negatives include no headaches and no shortness of breath. Nothing aggravates the symptoms. Nothing relieves the symptoms. She has tried nothing for the symptoms. The treatment provided no relief.    Past Medical History:  Diagnosis Date  . Depression   . Diabetes mellitus without complication ([employer])   . Hypertension     Patient Active Problem List   Diagnosis Date Noted  . Left medial knee pain 07/31/2016  . HTN (hypertension) 11/17/2015  . Alcohol abuse   . MDD (major depressive disorder), recurrent severe, without psychosis ([place]) 02/12/2015  . Substance induced mood disorder ([place]) 02/12/2015  . Polysubstance abuse   . Wrist pain, chronic 12/22/2014  . Trochanteric bursitis of left  hip 12/22/2014  . Seasonal allergies 09/12/2014  . Smoker 09/12/2014  . Shortness of breath 09/12/2014  . Diabetes mellitus type 2, insulin dependent ([place]) 04/01/2014  . Pain due to onychomycosis of toenail 04/01/2014  . Cocaine abuse 04/01/2014  . Depression 03/19/2014    Past Surgical History:  Procedure Laterality Date  . CHOLECYSTECTOMY      OB History    No data available       Home Medications    Prior to Admission medications   Medication Sig Start Date End Date Taking? Authorizing Provider  ACCU-CHEK SOFTCLIX LANCETS lancets 1 each by Other route 3 (three) times daily. ICD 10 E11.9 11/17/15   Funches, Adriana Mccallum, MD  albuterol (PROAIR HFA) 108 (90 Base) MCG/ACT inhaler INHALE 1 PUFF INTO THE LUNGS EVERY 6 HOURS AS NEEDED FOR WHEEZING OR SHORTNESS OF BREATH. 07/28/16   Boykin Nearing, MD  atorvastatin (LIPITOR) 40 MG tablet Take 1 tablet (40 mg total) by mouth daily. 11/17/15   Funches, Adriana Mccallum, MD  Blood Glucose Monitoring Suppl (ACCU-CHEK AVIVA PLUS) w/Device KIT 1 Device by Does not apply route 3 (three) times daily after meals. ICD 10 E11.9 11/17/15   Boykin Nearing, MD  doxycycline (VIBRAMYCIN) 100 MG capsule Take 1 capsule (100 mg total) by mouth 2 (two) times daily. 02/10/17   Deno Etienne, DO  glucose blood (ACCU-CHEK AVIVA PLUS) test strip 1 each by Other route 3 (three) times daily. ICD 10 E11.9 11/17/15   Boykin Nearing, MD  Insulin Glargine (LANTUS SOLOSTAR) 100 UNIT/ML Solostar Pen Inject 30 Units into the skin daily at 10 pm. 07/28/16   Boykin Nearing, MD  Insulin Pen Needle (ULTICARE MICRO PEN NEEDLES) 32G X 4 MM MISC 1 each by Does not apply route at bedtime. 11/17/15   Funches, Adriana Mccallum, MD  lisinopril (PRINIVIL,ZESTRIL) 5 MG tablet Take 1 tablet (5 mg total) by mouth daily. For blood pressure 07/28/16   Funches, Adriana Mccallum, MD  metFORMIN (GLUCOPHAGE) 1000 MG tablet Take 1 tablet (1,000 mg total) by mouth 2 (two) times daily with a meal. 07/28/16   Funches, Adriana Mccallum, MD    nortriptyline (PAMELOR) 50 MG capsule Take 1 capsule (50 mg total) by mouth at bedtime. 07/28/16   Funches, Adriana Mccallum, MD  sitaGLIPtin (JANUVIA) 100 MG tablet Take 1 tablet (100 mg total) by mouth daily. 07/28/16   Funches, Adriana Mccallum, MD  terbinafine (LAMISIL) 250 MG tablet Take 1 tablet (250 mg total) by mouth daily. 05/28/15   Boykin Nearing, MD    Family History No family history on file.  Social History Social History  Substance Use Topics  . Smoking status: Current Every Day Smoker    Packs/day: 0.25    Types: Cigarettes  . Smokeless tobacco: Current User     Comment: states she cut back to 4 cigarettes/per day  . Alcohol use 0.0 oz/week     Comment: every other week      Allergies   Aspirin   Review of Systems Review of Systems  Constitutional: Negative for chills and fever.  HENT: Negative for congestion and rhinorrhea.   Eyes: Negative for redness and visual disturbance.  Respiratory: Negative for shortness of breath and wheezing.   Cardiovascular: Positive for chest pain. Negative for palpitations.  Gastrointestinal: Negative for nausea and vomiting.  Genitourinary: Positive for vaginal discharge. Negative for dysuria and urgency.  Musculoskeletal: Positive for arthralgias. Negative for myalgias.  Skin: Negative for pallor and wound.  Neurological: Negative for dizziness and headaches.     Physical Exam Updated Vital Signs BP (!) 146/74   Pulse 82   Temp 98.4 F (36.9 C) (Oral)   Resp (!) 22   Ht '5\' 3"'  (1.6 m)   Wt 59 kg (130 lb)   SpO2 99%   BMI 23.03 kg/m   Physical Exam  Constitutional: She is oriented to person, place, and time. She appears well-developed and well-nourished. No distress.  HENT:  Head: Normocephalic and atraumatic.  Eyes: Pupils are equal, round, and reactive to light. EOM are normal.  Neck: Normal range of motion. Neck supple.  Cardiovascular: Normal rate and regular rhythm.  Exam reveals no gallop and no friction rub.   No  murmur heard. Pulmonary/Chest: Effort normal. She has no wheezes. She has no rales.  Abdominal: Soft. She exhibits no distension and no mass. There is tenderness (mild, reproduces chest pain). There is no guarding.  Genitourinary: Cervix exhibits motion tenderness and discharge (purulent appearing). Right adnexum displays no mass, no tenderness and no fullness. Left adnexum displays no mass, no tenderness and no fullness.  Musculoskeletal: She exhibits tenderness (mild to medial joint line of LLE.  PMS intact distally). She exhibits no edema.  Neurological: She is alert and oriented to person, place, and time.  Skin: Skin is warm and dry. She is not diaphoretic.  Psychiatric: She has a normal mood and affect. Her behavior is normal.  Nursing note and vitals reviewed.    ED Treatments / Results  Labs (all labs ordered are listed, but  only abnormal results are displayed) Labs Reviewed  WET PREP, GENITAL - Abnormal; Notable for the following:       Result Value   Trich, Wet Prep PRESENT (*)    WBC, Wet Prep HPF POC MANY (*)    All other components within normal limits  URINALYSIS, ROUTINE W REFLEX MICROSCOPIC - Abnormal; Notable for the following:    APPearance HAZY (*)    Glucose, UA 150 (*)    Protein, ur 30 (*)    Leukocytes, UA LARGE (*)    Bacteria, UA RARE (*)    Squamous Epithelial / LPF 0-5 (*)    All other components within normal limits  CBG MONITORING, ED - Abnormal; Notable for the following:    Glucose-Capillary 166 (*)    All other components within normal limits  RPR  HIV ANTIBODY (ROUTINE TESTING)  I-STAT BETA HCG BLOOD, ED ([facility], [facility], [facility] ONLY)  GC/CHLAMYDIA PROBE AMP ([place]) NOT AT [facility]    EKG  EKG Interpretation  Date/Time:  Friday February 10 2017 00:23:10 EDT Ventricular Rate:  81 PR Interval:    QRS Duration: 75 QT Interval:  377 QTC Calculation: 438 R Axis:   70 Text Interpretation:  Sinus rhythm No significant change since last tracing Confirmed  by Deno Etienne [phone]) on 02/10/2017 12:33:51 AM       Radiology No results found.  Procedures Procedures (including critical care time)  Medications Ordered in ED Medications  cefTRIAXone (ROCEPHIN) injection 250 mg (250 mg Intramuscular Given 02/10/17 0013)  azithromycin (ZITHROMAX) tablet 1,000 mg (1,000 mg Oral Given 02/10/17 0012)  metroNIDAZOLE (FLAGYL) tablet 2,000 mg (2,000 mg Oral Given 02/10/17 0011)  ondansetron (ZOFRAN-ODT) disintegrating tablet 4 mg (4 mg Oral Given 02/10/17 0012)     Initial Impression / Assessment and Plan / ED Course  I have reviewed the triage vital signs and the nursing notes.  Pertinent labs & imaging results that were available during my care of the patient were reviewed by me and considered in my medical decision making (see chart for details).     56 yo F With multiple complaints. She had chest pain sounds completely atypical for ACS. Her symptoms were mildly reproduced with palpation to her epigastrium. She is found to have Trichomonas on her urine. I discussed results with her. Will treat presumptively for GC. Patient has some very mild medial joint pain. full range of motion.  3:40 AM:  I have discussed the diagnosis/risks/treatment options with the patient and believe the pt to be eligible for discharge home to follow-up with PCP. We also discussed returning to the ED immediately if new or worsening sx occur. We discussed the sx which are most concerning (e.g., sudden worsening pain, fever, inability to tolerate by mouth) that necessitate immediate return. Medications administered to the patient during their visit and any new prescriptions provided to the patient are listed below.  Medications given during this visit Medications  cefTRIAXone (ROCEPHIN) injection 250 mg (250 mg Intramuscular Given 02/10/17 0013)  azithromycin (ZITHROMAX) tablet 1,000 mg (1,000 mg Oral Given 02/10/17 0012)  metroNIDAZOLE (FLAGYL) tablet 2,000 mg (2,000 mg Oral Given 02/10/17  0011)  ondansetron (ZOFRAN-ODT) disintegrating tablet 4 mg (4 mg Oral Given 02/10/17 0012)     The patient appears reasonably screen and/or stabilized for discharge and I doubt any other medical condition or other [facility] requiring further screening, evaluation, or treatment in the ED at this time prior to discharge.    Final Clinical Impressions(s) / ED Diagnoses  Final diagnoses:  Trichomoniasis  Vaginal discharge  Chronic pain of left knee  Chest pain in adult    New Prescriptions Discharge Medication List as of 02/10/2017 12:59 AM    START taking these medications   Details  doxycycline (VIBRAMYCIN) 100 MG capsule Take 1 capsule (100 mg total) by mouth 2 (two) times daily., Starting Fri 02/10/2017, Print         Tyrone Nine, Linna Hoff, DO 02/10/17 [phone]

## 2017-02-09 NOTE — ED Triage Notes (Signed)
Pt came in w/ multiple complaints including "being sick", vaginal discharge (few days) and left leg pain.  Pt able to ambulate w/o difficulties.

## 2017-02-10 LAB — WET PREP, GENITAL
Clue Cells Wet Prep HPF POC: NONE SEEN
Sperm: NONE SEEN
Yeast Wet Prep HPF POC: NONE SEEN

## 2017-02-10 LAB — GC/CHLAMYDIA PROBE AMP (~~LOC~~) NOT AT ARMC
Chlamydia: NEGATIVE
Neisseria Gonorrhea: NEGATIVE

## 2017-02-10 LAB — RPR: RPR Ser Ql: NONREACTIVE

## 2017-02-10 LAB — HIV ANTIBODY (ROUTINE TESTING W REFLEX): HIV Screen 4th Generation wRfx: NONREACTIVE

## 2017-02-10 MED ORDER — DOXYCYCLINE HYCLATE 100 MG PO CAPS: 100.0000 mg | ORAL_CAPSULE | Freq: Two times a day (BID) | ORAL | 0 refills | Status: DC

## 2017-02-10 NOTE — ED Notes (Signed)
Pt stable, ambulatory, states understanding of discharge instructions 

## 2017-02-10 NOTE — ED Notes (Signed)
Pt walked back into ED from lobby states her ride wasn't there yet this RN able to go over discharge paperwork and prescription.

## 2017-02-10 NOTE — ED Notes (Signed)
Pt eloped from room prior to receiving discharge instructions or antibiotic prescription.  Dr. Adela LankFloyd notified.

## 2017-02-10 NOTE — Discharge Instructions (Signed)
Take 4 over the counter ibuprofen tablets 3 times a day or 2 over-the-counter naproxen tablets twice a day for pain. Also take tylenol 1000mg(2 extra strength) four times a day.    

## 2017-03-28 ENCOUNTER — Ambulatory Visit: Payer: Self-pay | Admitting: Internal Medicine

## 2017-06-09 ENCOUNTER — Encounter: Payer: Self-pay | Admitting: Nurse Practitioner

## 2017-06-09 ENCOUNTER — Ambulatory Visit: Payer: Self-pay | Attending: Nurse Practitioner | Admitting: Nurse Practitioner

## 2017-06-09 VITALS — BP 103/65 | HR 90 | Temp 97.8°F | Resp 14 | Ht 63.39 in | Wt 141.8 lb

## 2017-06-09 DIAGNOSIS — I10 Essential (primary) hypertension: Secondary | ICD-10-CM | POA: Insufficient documentation

## 2017-06-09 DIAGNOSIS — F324 Major depressive disorder, single episode, in partial remission: Secondary | ICD-10-CM

## 2017-06-09 DIAGNOSIS — Z1231 Encounter for screening mammogram for malignant neoplasm of breast: Secondary | ICD-10-CM

## 2017-06-09 DIAGNOSIS — E1165 Type 2 diabetes mellitus with hyperglycemia: Secondary | ICD-10-CM | POA: Insufficient documentation

## 2017-06-09 DIAGNOSIS — Z794 Long term (current) use of insulin: Secondary | ICD-10-CM | POA: Insufficient documentation

## 2017-06-09 DIAGNOSIS — F17200 Nicotine dependence, unspecified, uncomplicated: Secondary | ICD-10-CM

## 2017-06-09 DIAGNOSIS — Z79899 Other long term (current) drug therapy: Secondary | ICD-10-CM | POA: Insufficient documentation

## 2017-06-09 DIAGNOSIS — E119 Type 2 diabetes mellitus without complications: Secondary | ICD-10-CM

## 2017-06-09 DIAGNOSIS — Z1211 Encounter for screening for malignant neoplasm of colon: Secondary | ICD-10-CM

## 2017-06-09 DIAGNOSIS — E785 Hyperlipidemia, unspecified: Secondary | ICD-10-CM | POA: Insufficient documentation

## 2017-06-09 DIAGNOSIS — Z23 Encounter for immunization: Secondary | ICD-10-CM | POA: Insufficient documentation

## 2017-06-09 DIAGNOSIS — M79671 Pain in right foot: Secondary | ICD-10-CM | POA: Insufficient documentation

## 2017-06-09 DIAGNOSIS — F1994 Other psychoactive substance use, unspecified with psychoactive substance-induced mood disorder: Secondary | ICD-10-CM

## 2017-06-09 DIAGNOSIS — E1169 Type 2 diabetes mellitus with other specified complication: Secondary | ICD-10-CM

## 2017-06-09 DIAGNOSIS — F17210 Nicotine dependence, cigarettes, uncomplicated: Secondary | ICD-10-CM | POA: Insufficient documentation

## 2017-06-09 DIAGNOSIS — F329 Major depressive disorder, single episode, unspecified: Secondary | ICD-10-CM | POA: Insufficient documentation

## 2017-06-09 LAB — POCT GLYCOSYLATED HEMOGLOBIN (HGB A1C): Hemoglobin A1C: 11.2

## 2017-06-09 LAB — GLUCOSE, POCT (MANUAL RESULT ENTRY): POC Glucose: 244 mg/dl — AB (ref 70–99)

## 2017-06-09 MED ORDER — SITAGLIPTIN PHOSPHATE 100 MG PO TABS: 100.0000 mg | ORAL_TABLET | Freq: Every day | ORAL | 1 refills | Status: DC

## 2017-06-09 MED ORDER — ACCU-CHEK AVIVA PLUS W/DEVICE KIT: 1.0000 | PACK | Freq: Three times a day (TID) | 0 refills | Status: DC

## 2017-06-09 MED ORDER — GLUCOSE BLOOD VI STRP: ORAL_STRIP | 12 refills | Status: DC

## 2017-06-09 MED ORDER — TRUE METRIX METER W/DEVICE KIT: PACK | 0 refills | Status: DC

## 2017-06-09 MED ORDER — GLUCOSE BLOOD VI STRP: 1.0000 | ORAL_STRIP | Freq: Three times a day (TID) | 12 refills | Status: DC

## 2017-06-09 MED ORDER — TRUEPLUS LANCETS 28G MISC: 12 refills | Status: DC

## 2017-06-09 MED ORDER — ACCU-CHEK SOFTCLIX LANCETS MISC
1.0000 | Freq: Three times a day (TID) | 12 refills | Status: DC
Start: 2017-06-09 — End: 2017-06-11

## 2017-06-09 MED ORDER — NICOTINE 21-14-7 MG/24HR TD KIT: 1.0000 | PACK | TRANSDERMAL | 0 refills | Status: DC

## 2017-06-09 MED ORDER — METFORMIN HCL 1000 MG PO TABS: 1000.0000 mg | ORAL_TABLET | Freq: Two times a day (BID) | ORAL | 1 refills | Status: DC

## 2017-06-09 MED ORDER — ALBUTEROL SULFATE HFA 108 (90 BASE) MCG/ACT IN AERS: INHALATION_SPRAY | RESPIRATORY_TRACT | 1 refills | Status: DC

## 2017-06-09 MED ORDER — LISINOPRIL 5 MG PO TABS: 5.0000 mg | ORAL_TABLET | Freq: Every day | ORAL | 1 refills | Status: DC

## 2017-06-09 MED ORDER — ATORVASTATIN CALCIUM 40 MG PO TABS: 40.0000 mg | ORAL_TABLET | Freq: Every day | ORAL | 1 refills | Status: DC

## 2017-06-09 MED ORDER — INSULIN GLARGINE 100 UNIT/ML SOLOSTAR PEN: 30.0000 [IU] | PEN_INJECTOR | Freq: Every day | SUBCUTANEOUS | 3 refills | Status: DC

## 2017-06-09 MED ORDER — INSULIN PEN NEEDLE 32G X 4 MM MISC: 1.0000 | Freq: Every day | 2 refills | Status: DC

## 2017-06-09 MED ORDER — NORTRIPTYLINE HCL 50 MG PO CAPS: 50.0000 mg | ORAL_CAPSULE | Freq: Every day | ORAL | 1 refills | Status: DC

## 2017-06-09 MED FILL — NORTRIPTYLINE HCL 50 MG CAP: 50 | 30 days supply | Qty: 30 | Fill #0

## 2017-06-09 MED FILL — ?METFORMIN HCL 1,000 MG TAB: 1000 | 30 days supply | Qty: 60 | Fill #0

## 2017-06-09 MED FILL — TRUEPLUS PEN NDL 32GX5/32": 32G X 4 MM | 30 days supply | Qty: 100 | Fill #0

## 2017-06-09 MED FILL — !LANTUS SOLOSTAR 100UNITS/M: 100 | 30 days supply | Qty: 9 | Fill #0

## 2017-06-09 MED FILL — ?ATORVASTATIN 40MG TABLET: 40 | 30 days supply | Qty: 30 | Fill #0

## 2017-06-09 MED FILL — TRUEPLUS PEN NDL 32GX5/32: 32G X 4 MM | 30 days supply | Qty: 100 | Fill #0

## 2017-06-09 MED FILL — ?LISINOPRIL 5 MG TABLET: 5 | 30 days supply | Qty: 30 | Fill #0

## 2017-06-09 MED FILL — !VENTOLIN HFA INHALER: 108 (90 BAS | 25 days supply | Qty: 18 | Fill #0

## 2017-06-09 MED FILL — TRUE METRIX TEST STRIP: 30 days supply | Qty: 100 | Fill #0

## 2017-06-09 MED FILL — !TRUE METRIX BLOOD GLUCOSE: 365 days supply | Qty: 1 | Fill #0

## 2017-06-09 MED FILL — JANUVIA 100 MG TABLET: 100 | 30 days supply | Qty: 30 | Fill #0

## 2017-06-09 MED FILL — TRUEplus LANCETS 28G MISC: 30 days supply | Qty: 100 | Fill #0

## 2017-06-09 NOTE — Progress Notes (Addendum)
Assessment & Plan:  Robin Montgomery was seen today for establish care.  Diagnoses and all orders for this visit:  Diabetes mellitus type 2, insulin dependent (HCC) -     Glucose (CBG) -     HgB A1c -     Ambulatory referral to Ophthalmology -     CBC -     Basic metabolic panel -     Lipid panel  Insulin Glargine (LANTUS SOLOSTAR) 100 UNIT/ML Solostar Pen; Inject 30 Units into the skin daily at 10 pm. -     Insulin Pen Needle (ULTICARE MICRO PEN NEEDLES) 32G X 4 MM MISC; 1 each by Does not apply route at bedtime. -     metFORMIN (GLUCOPHAGE) 1000 MG tablet; Take 1 tablet (1,000 mg total) by mouth 2 (two) times daily with a meal. -     sitaGLIPtin (JANUVIA) 100 MG tablet; Take 1 tablet (100 mg total) by mouth daily. -     Referral to Nutrition and Diabetes Services -     glucose blood (TRUE METRIX BLOOD GLUCOSE TEST) test strip; 1 each by Other route 3 (three) times daily. -     TRUEPLUS LANCETS 28G MISC; 1 each by Other route 3 (three) times daily. Diabetes is poorly controlled. Advised patient to keep a fasting blood sugar log fast, 2 hours post lunch and bedtime which will be reviewed at the next office visit.  Colon cancer screening -     Ambulatory referral to Gastroenterology  Substance induced mood disorder ([place]) Stable  Major depressive disorder with single episode, in partial remission (HCC) -     nortriptyline (PAMELOR) 50 MG capsule; Take 1 capsule (50 mg total) by mouth at bedtime  Essential hypertension -     lisinopril (PRINIVIL,ZESTRIL) 5 MG tablet; Take 1 tablet (5 mg total) by mouth daily. For blood pressure Continue all antihypertensives as prescribed.  Remember to bring in your blood pressure log with you for your follow up appointment.  DASH/Mediterranean Diets are healthier choices for HTN.    Tobacco dependence -     albuterol (PROAIR HFA) 108 (90 Base) MCG/ACT inhaler; INHALE 1 PUFF INTO THE LUNGS EVERY 6 HOURS AS NEEDED FOR WHEEZING OR SHORTNESS OF BREATH. -      Nicotine 21-14-7 MG/24HR KIT; Place 1 patch onto the skin daily.  Breast cancer screening by mammogram -     MM DIGITAL SCREENING BILATERAL; Future  Right foot pain -     DG Foot Complete Right; Future  Immunization due -     Flu Vaccine QUAD 6+ mos PF IM (Fluarix Quad PF)  Hyperlipidemia associated with type 2 diabetes mellitus (HCC) -     atorvastatin (LIPITOR) 40 MG tablet; Take 1 tablet (40 mg total) by mouth daily. Exercise 150 minutes per week.  Low fat, heart healthy diet and participate in regular aerobic exercise program to control as well.  Other orders -     Blood Glucose Monitoring Suppl (TRUE METRIX METER) w/Device KIT; Use as directed    Patient has been counseled on age-appropriate routine health concerns for screening and prevention. These are reviewed and up-to-date. Referrals have been placed accordingly. Immunizations are up-to-date or declined.    Subjective:   Chief Complaint  Patient presents with  . Establish Care    Patient is here to establish care. Patient stated she need medication refills. Patient would like her right foot to be check out. Patient stated she dropped a bowl on it 2 weeks ago.  HPI Robin Montgomery 57 y.o. female presents to office today for refills. She is establishing care with me today. Reports being out of medications for 3-4 months now.   Right Foot Pain 2 weeks ago she dropped a large bowl of ribs on the side of her right foot. She immediately experienced pain and bruising near the fifth toe. She has been able to walk on her right foot however there is a deformity below the fifth toe that is a new finding since the injury a few weeks ago. There is no skin breakdown or sign of infection.   DM She reports not being able to find her glucometer. Has not been checking her blood sugars nor is she diet or exercise compliant. Ordered diabetes nutritionist counseling today. She endorses uncertainty with diabetes nutrition and how to  manage her diabetes. We discussed complications that can occur from poorly controlled diabetes. She does endorse visual changes and lightheadedness. Denies neuropathy at this time.   Lab Results  Component Value Date   HGBA1C 11.2 06/09/2017   HTN Well controlled considering she has been out of her blood pressure medication for several months. She denies chest pain, shortness of breath, palpitations, headaches or bilateral lower extremity edema.  BP Readings from Last 3 Encounters:  06/09/17 103/65  02/10/17 (!) 146/74  07/28/16 126/76    Tobacco Dependence Down to 3 cigarettes a day. She has smoked up to a ppd in the past.   She has been smoking since the age of 61-71 years of age. She is ready to quit. Requesting nicotine patches.    Depression Well controlled on pamelor. Denies suicidal or homicidal ideation.    Substance Abuse Denies "recent" use of cocaine or heroin but not forthcoming with exact timeframe of last use.    Review of Systems  Constitutional: Negative for fever, malaise/fatigue and weight loss.  HENT: Negative.  Negative for nosebleeds.   Eyes: Negative.  Negative for blurred vision, double vision and photophobia.  Respiratory: Negative.  Negative for cough and shortness of breath.   Cardiovascular: Negative.  Negative for chest pain, palpitations and leg swelling.  Gastrointestinal: Negative.  Negative for abdominal pain, constipation, diarrhea, heartburn, nausea and vomiting.  Musculoskeletal: Positive for joint pain. Negative for myalgias.  Neurological: Negative.  Negative for dizziness, focal weakness, seizures and headaches.  Endo/Heme/Allergies: Negative for environmental allergies.  Psychiatric/Behavioral: Positive for depression and substance abuse. Negative for hallucinations, memory loss and suicidal ideas. The patient has insomnia. The patient is not nervous/anxious.     Past Medical History:  Diagnosis Date  . Depression     Past Surgical  History:  Procedure Laterality Date  . CHOLECYSTECTOMY      History reviewed. No pertinent family history.  Social History Reviewed with no changes to be made today.   Outpatient Medications Prior to Visit  Medication Sig Dispense Refill  . ACCU-CHEK SOFTCLIX LANCETS lancets 1 each by Other route 3 (three) times daily. ICD 10 E11.9 (Patient not taking: Reported on 06/09/2017) 100 each 12  . albuterol (PROAIR HFA) 108 (90 Base) MCG/ACT inhaler INHALE 1 PUFF INTO THE LUNGS EVERY 6 HOURS AS NEEDED FOR WHEEZING OR SHORTNESS OF BREATH. (Patient not taking: Reported on 06/09/2017) 8.5 g 5  . atorvastatin (LIPITOR) 40 MG tablet Take 1 tablet (40 mg total) by mouth daily. (Patient not taking: Reported on 06/09/2017) 90 tablet 3  . Blood Glucose Monitoring Suppl (ACCU-CHEK AVIVA PLUS) w/Device KIT 1 Device by Does not apply route 3 (  three) times daily after meals. ICD 10 E11.9 (Patient not taking: Reported on 06/09/2017) 1 kit 0  . doxycycline (VIBRAMYCIN) 100 MG capsule Take 1 capsule (100 mg total) by mouth 2 (two) times daily. (Patient not taking: Reported on 06/09/2017) 20 capsule 0  . glucose blood (ACCU-CHEK AVIVA PLUS) test strip 1 each by Other route 3 (three) times daily. ICD 10 E11.9 (Patient not taking: Reported on 06/09/2017) 100 each 12  . Insulin Glargine (LANTUS SOLOSTAR) 100 UNIT/ML Solostar Pen Inject 30 Units into the skin daily at 10 pm. (Patient not taking: Reported on 06/09/2017) 5 pen 3  . Insulin Pen Needle (ULTICARE MICRO PEN NEEDLES) 32G X 4 MM MISC 1 each by Does not apply route at bedtime. (Patient not taking: Reported on 06/09/2017) 100 each 2  . lisinopril (PRINIVIL,ZESTRIL) 5 MG tablet Take 1 tablet (5 mg total) by mouth daily. For blood pressure (Patient not taking: Reported on 06/09/2017) 90 tablet 3  . metFORMIN (GLUCOPHAGE) 1000 MG tablet Take 1 tablet (1,000 mg total) by mouth 2 (two) times daily with a meal. (Patient not taking: Reported on 06/09/2017) 180 tablet 3  . nortriptyline  (PAMELOR) 50 MG capsule Take 1 capsule (50 mg total) by mouth at bedtime. (Patient not taking: Reported on 06/09/2017) 90 capsule 2  . sitaGLIPtin (JANUVIA) 100 MG tablet Take 1 tablet (100 mg total) by mouth daily. (Patient not taking: Reported on 06/09/2017) 90 tablet 3  . terbinafine (LAMISIL) 250 MG tablet Take 1 tablet (250 mg total) by mouth daily. (Patient not taking: Reported on 06/09/2017) 30 tablet 2   No facility-administered medications prior to visit.     Allergies  Allergen Reactions  . Aspirin Nausea And Vomiting    Upset stomach       Objective:    BP 103/65 (BP Location: Left Arm, Patient Position: Sitting, Cuff Size: Normal)   Pulse 90   Temp 97.8 F (36.6 C) (Oral)   Resp 14   Ht 5' 3.39" (1.61 m)   Wt 141 lb 12.8 oz (64.3 kg)   SpO2 97%   BMI 24.81 kg/m  Wt Readings from Last 3 Encounters:  06/09/17 141 lb 12.8 oz (64.3 kg)  02/09/17 130 lb (59 kg)  07/28/16 141 lb 3.2 oz (64 kg)    Physical Exam  Constitutional: She is oriented to person, place, and time. She appears well-developed and well-nourished. She is cooperative.  HENT:  Head: Normocephalic and atraumatic.  Eyes: EOM are normal.  Neck: Normal range of motion.  Cardiovascular: Normal rate, regular rhythm, normal heart sounds and intact distal pulses. Exam reveals no gallop and no friction rub.  No murmur heard. Pulmonary/Chest: Effort normal and breath sounds normal. No tachypnea. No respiratory distress. She has no decreased breath sounds. She has no wheezes. She has no rhonchi. She has no rales. She exhibits no tenderness.  Abdominal: Soft. Bowel sounds are normal.  Musculoskeletal: Normal range of motion. She exhibits no edema.       Feet:  Neurological: She is alert and oriented to person, place, and time. Coordination normal.  Skin: Skin is warm and dry.  Sensory exam of the foot is normal, tested with the monofilament. Good pulses, no lesions or ulcers, good peripheral pulses.  Psychiatric:  She has a normal mood and affect. Her behavior is normal. Judgment and thought content normal.  Nursing note and vitals reviewed.      Patient has been counseled extensively about nutrition and exercise as well as the  importance of adherence with medications and regular follow-up. The patient was given clear instructions to go to ER or return to medical center if symptoms don't improve, worsen or new problems develop. The patient verbalized understanding.   Follow-up: Return in about 3 months (around 09/07/2017) for HTN/HPL/DM.   Gildardo Pounds, FNP-BC [facility] and [facility] [place], [place]   06/11/2017, 8:36 PM

## 2017-06-09 NOTE — Patient Instructions (Addendum)
Diabetes Mellitus and Nutrition When you have diabetes (diabetes mellitus), it is very important to have healthy eating habits because your blood sugar (glucose) levels are greatly affected by what you eat and drink. Eating healthy foods in the appropriate amounts, at about the same times every day, can help you:  Control your blood glucose.  Lower your risk of heart disease.  Improve your blood pressure.  Reach or maintain a healthy weight.  Every person with diabetes is different, and each person has different needs for a meal plan. Your health care provider may recommend that you work with a diet and nutrition specialist (dietitian) to make a meal plan that is best for you. Your meal plan may vary depending on factors such as:  The calories you need.  The medicines you take.  Your weight.  Your blood glucose, blood pressure, and cholesterol levels.  Your activity level.  Other health conditions you have, such as heart or kidney disease.  How do carbohydrates affect me? Carbohydrates affect your blood glucose level more than any other type of food. Eating carbohydrates naturally increases the amount of glucose in your blood. Carbohydrate counting is a method for keeping track of how many carbohydrates you eat. Counting carbohydrates is important to keep your blood glucose at a healthy level, especially if you use insulin or take certain oral diabetes medicines. It is important to know how many carbohydrates you can safely have in each meal. This is different for every person. Your dietitian can help you calculate how many carbohydrates you should have at each meal and for snack. Foods that contain carbohydrates include:  Bread, cereal, rice, pasta, and crackers.  Potatoes and corn.  Peas, beans, and lentils.  Milk and yogurt.  Fruit and juice.  Desserts, such as cakes, cookies, ice cream, and candy.  How does alcohol affect me? Alcohol can cause a sudden decrease in blood  glucose (hypoglycemia), especially if you use insulin or take certain oral diabetes medicines. Hypoglycemia can be a life-threatening condition. Symptoms of hypoglycemia (sleepiness, dizziness, and confusion) are similar to symptoms of having too much alcohol. If your health care provider says that alcohol is safe for you, follow these guidelines:  Limit alcohol intake to no more than 1 drink per day for nonpregnant women and 2 drinks per day for men. One drink equals 12 oz of beer, 5 oz of wine, or 1 oz of hard liquor.  Do not drink on an empty stomach.  Keep yourself hydrated with water, diet soda, or unsweetened iced tea.  Keep in mind that regular soda, juice, and other mixers may contain a lot of sugar and must be counted as carbohydrates.  What are tips for following this plan? Reading food labels  Start by checking the serving size on the label. The amount of calories, carbohydrates, fats, and other nutrients listed on the label are based on one serving of the food. Many foods contain more than one serving per package.  Check the total grams (g) of carbohydrates in one serving. You can calculate the number of servings of carbohydrates in one serving by dividing the total carbohydrates by 15. For example, if a food has 30 g of total carbohydrates, it would be equal to 2 servings of carbohydrates.  Check the number of grams (g) of saturated and trans fats in one serving. Choose foods that have low or no amount of these fats.  Check the number of milligrams (mg) of sodium in one serving. Most people   should limit total sodium intake to less than 2,300 mg per day.  Always check the nutrition information of foods labeled as "low-fat" or "nonfat". These foods may be higher in added sugar or refined carbohydrates and should be avoided.  Talk to your dietitian to identify your daily goals for nutrients listed on the label. Shopping  Avoid buying canned, premade, or processed foods. These  foods tend to be high in fat, sodium, and added sugar.  Shop around the outside edge of the grocery store. This includes fresh fruits and vegetables, bulk grains, fresh meats, and fresh dairy. Cooking  Use low-heat cooking methods, such as baking, instead of high-heat cooking methods like deep frying.  Cook using healthy oils, such as olive, canola, or sunflower oil.  Avoid cooking with butter, cream, or high-fat meats. Meal planning  Eat meals and snacks regularly, preferably at the same times every day. Avoid going long periods of time without eating.  Eat foods high in fiber, such as fresh fruits, vegetables, beans, and whole grains. Talk to your dietitian about how many servings of carbohydrates you can eat at each meal.  Eat 4-6 ounces of lean protein each day, such as lean meat, chicken, fish, eggs, or tofu. 1 ounce is equal to 1 ounce of meat, chicken, or fish, 1 egg, or 1/4 cup of tofu.  Eat some foods each day that contain healthy fats, such as avocado, nuts, seeds, and fish. Lifestyle   Check your blood glucose regularly.  Exercise at least 30 minutes 5 or more days each week, or as told by your health care provider.  Take medicines as told by your health care provider.  Do not use any products that contain nicotine or tobacco, such as cigarettes and e-cigarettes. If you need help quitting, ask your health care provider.  Work with a counselor or diabetes educator to identify strategies to manage stress and any emotional and social challenges. What are some questions to ask my health care provider?  Do I need to meet with a diabetes educator?  Do I need to meet with a dietitian?  What number can I call if I have questions?  When are the best times to check my blood glucose? Where to find more information:  American Diabetes Association: diabetes.org/food-and-fitness/food  Academy of Nutrition and Dietetics:  www.eatright.org/resources/health/diseases-and-conditions/diabetes  National Institute of Diabetes and Digestive and Kidney Diseases (NIH): www.niddk.nih.gov/health-information/diabetes/overview/diet-eating-physical-activity Summary  A healthy meal plan will help you control your blood glucose and maintain a healthy lifestyle.  Working with a diet and nutrition specialist (dietitian) can help you make a meal plan that is best for you.  Keep in mind that carbohydrates and alcohol have immediate effects on your blood glucose levels. It is important to count carbohydrates and to use alcohol carefully. This information is not intended to replace advice given to you by your health care provider. Make sure you discuss any questions you have with your health care provider. Document Released: 02/17/2005 Document Revised: 06/27/2016 Document Reviewed: 06/27/2016 Elsevier Interactive Patient Education  2018 Elsevier Inc.  How to Avoid Diabetes Mellitus Problems You can take action to prevent or slow down problems that are caused by diabetes (diabetes mellitus). Following your diabetes plan and taking care of yourself can reduce your risk of serious or life-threatening complications. Manage your diabetes  Follow instructions from your health care providers about managing your diabetes. Your diabetes may be managed by a team of health care providers who can teach you how to care for   yourself and can answer questions that you have.  Educate yourself about your condition so you can make healthy choices about eating and physical activity.  Check your blood sugar (glucose) levels as often as directed. Your health care provider will help you decide how often to check your blood glucose level depending on your treatment goals and how well you are meeting them.  Ask your health care provider if you should take low-dose aspirin daily and what dose is recommended for you. Taking low-dose aspirin daily is  recommended to help prevent cardiovascular disease. Do not use nicotine or tobacco Do not use any products that contain nicotine or tobacco, such as cigarettes and e-cigarettes. If you need help quitting, ask your health care provider. Nicotine raises your risk for diabetes problems. If you quit using nicotine:  You will lower your risk for heart attack, stroke, nerve disease, and kidney disease.  Your cholesterol and blood pressure may improve.  Your blood circulation will improve.  Keep your blood pressure under control To control your blood pressure:  Follow instructions from your health care provider about meal planning, exercise, and medicines.  Make sure your health care provider checks your blood pressure at every medical visit.  A blood pressure reading consists of two numbers. Generally, the goal is to keep your top number (systolic pressure) at or below 130, and your bottom number (diastolic pressure) at or below 80. Your health care provider may recommend a lower target blood pressure. Your individualized target blood pressure is determined based on:  Your age.  Your medicines.  How long you have had diabetes.  Any other medical conditions you have.  Keep your cholesterol under control To control your cholesterol:  Follow instructions from your health care provider about meal planning, exercise, and medicines.  Have your cholesterol checked at least once a year.  You may be prescribed medicine to lower cholesterol (statin). If you are not taking a statin, ask your health care provider if you should be.  Controlling your cholesterol may:  Help prevent heart disease and stroke. These are the most common health problems for people with diabetes.  Improve your blood flow.  Schedule and keep yearly physical exams and eye exams Your health care provider will tell you how often you need medical visits depending on your diabetes management plan. Keep all follow-up visits  as directed. This is important so possible problems can be identified early and complications can be avoided or treated.  Every visit with your health care provider should include measuring your: ? Weight. ? Blood pressure. ? Blood glucose control.  Your A1c (hemoglobin A1c) level should be checked: ? At least 2 times a year, if you are meeting your treatment goals. ? 4 times a year, if you are not meeting treatment goals or if your treatment goals have changed.  Your blood lipids (lipid profile) should be checked yearly. You should also be checked yearly for protein in your urine (urine microalbumin).  If you have type 1 diabetes, get an eye exam 3-5 years after you are diagnosed, and then once a year after your first exam.  If you have type 2 diabetes, get an eye exam as soon as you are diagnosed, and then once a year after your first exam.  Keep your vaccines current It is recommended that you receive:  A flu (influenza) vaccine every year.  A pneumonia (pneumococcal) vaccine and a hepatitis B vaccine. If you are age 65 or older, you may get   the pneumonia vaccine as a series of two separate shots.  Ask your health care provider which other vaccines may be recommended. Take care of your feet Diabetes may cause you to have poor blood circulation to your legs and feet. Because of this, taking care of your feet is very important. Diabetes can cause:  The skin on the feet to get thinner, break more easily, and heal more slowly.  Nerve damage in your legs and feet, which results in decreased feeling. You may not notice minor injuries that could lead to serious problems.  To avoid foot problems:  Check your skin and feet every day for cuts, bruises, redness, blisters, or sores.  Schedule a foot exam with your health care provider once every year. This exam includes: ? Inspecting of the structure and skin of your feet. ? Checking the pulses and sensation in your feet.  Make sure  that your health care provider performs a visual foot exam at every medical visit.  Take care of your teeth People with poorly controlled diabetes are more likely to have gum (periodontal) disease. Diabetes can make periodontal diseases harder to control. If not treated, periodontal diseases can lead to tooth loss. To prevent this:  Brush your teeth twice a day.  Floss at least once a day.  Visit your dentist 2 times a year.  Drink responsibly Limit alcohol intake to no more than 1 drink a day for nonpregnant women and 2 drinks a day for men. One drink equals 12 oz of beer, 5 oz of wine, or 1 oz of hard liquor. It is important to eat food when you drink alcohol to avoid low blood glucose (hypoglycemia). Avoid alcohol if you:  Have a history of alcohol abuse or dependence.  Are pregnant.  Have liver disease, pancreatitis, advanced neuropathy, or severe hypertriglyceridemia.  Lessen stress Living with diabetes can be stressful. When you are experiencing stress, your blood glucose may be affected in two ways:  Stress hormones may cause your blood glucose to rise.  You may be distracted from taking good care of yourself.  Be aware of your stress level and make changes to help you manage challenging situations. To lower your stress levels:  Consider joining a support group.  Do planned relaxation or meditation.  Do a hobby that you enjoy.  Maintain healthy relationships.  Exercise regularly.  Work with your health care provider or a mental health professional.  Summary  You can take action to prevent or slow down problems that are caused by diabetes (diabetes mellitus). Following your diabetes plan and taking care of yourself can reduce your risk of serious or life-threatening complications.  Follow instructions from your health care providers about managing your diabetes. Your diabetes may be managed by a team of health care providers who can teach you how to care for  yourself and can answer questions that you have.  Your health care provider will tell you how often you need medical visits depending on your diabetes management plan. Keep all follow-up visits as directed. This is important so possible problems can be identified early and complications can be avoided or treated. This information is not intended to replace advice given to you by your health care provider. Make sure you discuss any questions you have with your health care provider. Document Released: 02/08/2011 Document Revised: 02/20/2016 Document Reviewed: 02/20/2016 Elsevier Interactive Patient Education  2018 Elsevier Inc.  

## 2017-06-10 LAB — BASIC METABOLIC PANEL
BUN/Creatinine Ratio: 27 — ABNORMAL HIGH (ref 9–23)
BUN: 17 mg/dL (ref 6–24)
CO2: 19 mmol/L — ABNORMAL LOW (ref 20–29)
Calcium: 9.5 mg/dL (ref 8.7–10.2)
Chloride: 102 mmol/L (ref 96–106)
Creatinine, Ser: 0.63 mg/dL (ref 0.57–1.00)
GFR calc Af Amer: 116 mL/min/{1.73_m2} (ref >59)
GFR calc non Af Amer: 101 mL/min/{1.73_m2} (ref >59)
Glucose: 272 mg/dL — ABNORMAL HIGH (ref 65–99)
Potassium: 4.6 mmol/L (ref 3.5–5.2)
Sodium: 138 mmol/L (ref 134–144)

## 2017-06-10 LAB — LIPID PANEL
Chol/HDL Ratio: 3.7 ratio (ref 0.0–4.4)
Cholesterol, Total: 256 mg/dL — ABNORMAL HIGH (ref 100–199)
HDL: 70 mg/dL (ref >39)
LDL Calculated: 161 mg/dL — ABNORMAL HIGH (ref 0–99)
Triglycerides: 123 mg/dL (ref 0–149)
VLDL Cholesterol Cal: 25 mg/dL (ref 5–40)

## 2017-06-10 LAB — CBC
Hematocrit: 38.2 % (ref 34.0–46.6)
Hemoglobin: 12.7 g/dL (ref 11.1–15.9)
MCH: 30.5 pg (ref 26.6–33.0)
MCHC: 33.2 g/dL (ref 31.5–35.7)
MCV: 92 fL (ref 79–97)
Platelets: 351 10*3/uL (ref 150–379)
RBC: 4.17 x10E6/uL (ref 3.77–5.28)
RDW: 13.6 % (ref 12.3–15.4)
WBC: 6.6 10*3/uL (ref 3.4–10.8)

## 2017-06-12 ENCOUNTER — Telehealth: Payer: Self-pay

## 2017-06-12 NOTE — Telephone Encounter (Signed)
-----   Message from Claiborne RiggZelda W Fleming, NP sent at 06/11/2017  8:52 PM EST ----- Lipid panel is abnormal. Please make sure you are taking your lipitor every evening. Other labs are normal. Try to stop smoking and let's work on reducing your A1c as we discussed.

## 2017-06-13 NOTE — Telephone Encounter (Signed)
CMA attempt to call patient regarding lab result, patient did not answer. No voicemail was set up.    Communication letter will be send out.

## 2017-06-13 NOTE — Telephone Encounter (Signed)
-----   Message from Zelda W Fleming, NP sent at 06/11/2017  8:52 PM EST ----- Lipid panel is abnormal. Please make sure you are taking your lipitor every evening. Other labs are normal. Try to stop smoking and let's work on reducing your A1c as we discussed. 

## 2017-06-27 ENCOUNTER — Other Ambulatory Visit: Payer: Self-pay | Admitting: Nurse Practitioner

## 2017-06-27 MED ORDER — NICOTINE 14 MG/24HR TD PT24: 14.0000 mg | MEDICATED_PATCH | Freq: Every day | TRANSDERMAL | 0 refills | Status: AC

## 2017-06-27 MED ORDER — NICOTINE 21 MG/24HR TD PT24: 21.0000 mg | MEDICATED_PATCH | Freq: Every day | TRANSDERMAL | 0 refills | Status: AC

## 2017-06-27 MED ORDER — NICOTINE 7 MG/24HR TD PT24: 7.0000 mg | MEDICATED_PATCH | Freq: Every day | TRANSDERMAL | 0 refills | Status: AC

## 2017-07-27 ENCOUNTER — Ambulatory Visit: Payer: Self-pay

## 2017-08-25 ENCOUNTER — Ambulatory Visit: Payer: Self-pay

## 2017-09-08 ENCOUNTER — Ambulatory Visit: Payer: Self-pay | Admitting: Nurse Practitioner

## 2017-10-23 ENCOUNTER — Ambulatory Visit: Payer: Medicaid Other | Attending: Nurse Practitioner

## 2017-10-23 MED FILL — ?METFORMIN HCL 1,000 MG TAB: 1000 | 30 days supply | Qty: 60 | Fill #1

## 2017-10-23 MED FILL — ?ATORVASTATIN 40MG TABLET: 40 | 30 days supply | Qty: 30 | Fill #1

## 2017-10-23 MED FILL — LISINOPRIL 5 MG TAB: 5 | 30 days supply | Qty: 30 | Fill #1

## 2017-10-23 MED FILL — !LANTUS SOLOSTAR 100UNITS/M: 100 | 30 days supply | Qty: 9 | Fill #1

## 2017-10-23 MED FILL — !VENTOLIN HFA INHALER: 108 (90 BAS | 25 days supply | Qty: 18 | Fill #1

## 2017-11-07 ENCOUNTER — Ambulatory Visit: Payer: Self-pay | Admitting: Nurse Practitioner

## 2017-12-30 ENCOUNTER — Emergency Department (HOSPITAL_COMMUNITY)
Admission: EM | Admit: 2017-12-30 | Discharge: 2017-12-30 | Disposition: A | Discharge status: Home / Self Care | Payer: Self-pay | Attending: Emergency Medicine | Admitting: Emergency Medicine

## 2017-12-30 ENCOUNTER — Encounter (HOSPITAL_COMMUNITY): Payer: Self-pay | Admitting: Internal Medicine

## 2017-12-30 ENCOUNTER — Emergency Department (HOSPITAL_COMMUNITY): Payer: Self-pay

## 2017-12-30 ENCOUNTER — Other Ambulatory Visit: Payer: Self-pay

## 2017-12-30 DIAGNOSIS — Z794 Long term (current) use of insulin: Secondary | ICD-10-CM | POA: Insufficient documentation

## 2017-12-30 DIAGNOSIS — J302 Other seasonal allergic rhinitis: Secondary | ICD-10-CM | POA: Insufficient documentation

## 2017-12-30 DIAGNOSIS — F17210 Nicotine dependence, cigarettes, uncomplicated: Secondary | ICD-10-CM | POA: Insufficient documentation

## 2017-12-30 DIAGNOSIS — F1410 Cocaine abuse, uncomplicated: Secondary | ICD-10-CM | POA: Insufficient documentation

## 2017-12-30 DIAGNOSIS — Z79899 Other long term (current) drug therapy: Secondary | ICD-10-CM | POA: Insufficient documentation

## 2017-12-30 DIAGNOSIS — E119 Type 2 diabetes mellitus without complications: Secondary | ICD-10-CM | POA: Insufficient documentation

## 2017-12-30 DIAGNOSIS — R0789 Other chest pain: Secondary | ICD-10-CM | POA: Insufficient documentation

## 2017-12-30 DIAGNOSIS — I10 Essential (primary) hypertension: Secondary | ICD-10-CM | POA: Insufficient documentation

## 2017-12-30 LAB — BASIC METABOLIC PANEL
Anion gap: 13 (ref 5–15)
BUN: 6 mg/dL (ref 6–20)
CO2: 22 mmol/L (ref 22–32)
Calcium: 9.1 mg/dL (ref 8.9–10.3)
Chloride: 103 mmol/L (ref 98–111)
Creatinine, Ser: 0.53 mg/dL (ref 0.44–1.00)
GFR calc Af Amer: >60 mL/min (ref >60)
GFR calc non Af Amer: >60 mL/min (ref >60)
Glucose, Bld: 142 mg/dL — ABNORMAL HIGH (ref 70–99)
Potassium: 3.3 mmol/L — ABNORMAL LOW (ref 3.5–5.1)
Sodium: 138 mmol/L (ref 135–145)

## 2017-12-30 LAB — RAPID URINE DRUG SCREEN, HOSP PERFORMED
Amphetamines: NOT DETECTED
Barbiturates: NOT DETECTED
Benzodiazepines: NOT DETECTED
Cocaine: POSITIVE — AB
Opiates: NOT DETECTED
Tetrahydrocannabinol: POSITIVE — AB

## 2017-12-30 LAB — CBC
HCT: 34.8 % — ABNORMAL LOW (ref 36.0–46.0)
Hemoglobin: 11.6 g/dL — ABNORMAL LOW (ref 12.0–15.0)
MCH: 31.6 pg (ref 26.0–34.0)
MCHC: 33.3 g/dL (ref 30.0–36.0)
MCV: 94.8 fL (ref 78.0–100.0)
Platelets: 295 10*3/uL (ref 150–400)
RBC: 3.67 MIL/uL — ABNORMAL LOW (ref 3.87–5.11)
RDW: 12.6 % (ref 11.5–15.5)
WBC: 6.3 10*3/uL (ref 4.0–10.5)

## 2017-12-30 LAB — I-STAT TROPONIN, ED
Troponin i, poc: 0 ng/mL (ref 0.00–0.08)
Troponin i, poc: 0 ng/mL (ref 0.00–0.08)

## 2017-12-30 LAB — I-STAT BETA HCG BLOOD, ED (MC, WL, AP ONLY): I-stat hCG, quantitative: <5 m[IU]/mL (ref <5)

## 2017-12-30 NOTE — ED Triage Notes (Signed)
Pt here from home c/o substernal chest pain since yesterday morning that woke her up. Pt describes pain as pressure 7/10. Pt allergic to aspirin. EMS gave 2 doses nitroglycerin. Pain did not change with nitroglycerin administration.

## 2017-12-30 NOTE — Discharge Instructions (Addendum)
Your cocaine use is likely causing chest pain.  Please seek help with cocaine abuse and follow up closely with your doctor for further care.

## 2017-12-30 NOTE — ED Notes (Signed)
Pt given d/c instructions at time of d/c and ambulated to lobby without complication or assistance. No questions or concerns at time of d/c.

## 2017-12-30 NOTE — ED Provider Notes (Signed)
[facility] EMERGENCY DEPARTMENT Provider Note   CSN: [id number] Arrival date & time: 12/30/17  1137     History   Chief Complaint Chief Complaint  Patient presents with  . Chest Pain    HPI Robin Montgomery is a 57 y.o. female.  The history is provided by the patient and medical records. No language interpreter was used.  Chest Pain       57-year-old female with history of hypertension, diabetes, depression, substance abuse disorder presenting via EMS from home for evaluation of chest pain.  History difficult to obtain as patient is drowsy and sleepy.  Patient report last night while she was watching TV she developed pain to her chest.  She described as a pressure sensation throughout her chest along with heaviness.  She did report breaking out to sweat and having some shortness of breath.  Pain is been persistent since, kept her up from sleeping and now she is very sleepy.  She rates the pain as 5 out of 10.  She report EMS was giving her sublingual nitro which did help her pain.  She denies any associated fever, headache, lightheadedness, dizziness, productive cough, hemoptysis, arm pain, abdominal or back pain.  She admits to drinking some alcohol last night and states last cocaine use was 2 to 3 days ago.  She did recall having one similar chest pain several months back which was short lasting and resolved without any specific treatment.  She is a smoker.  Past Medical History:  Diagnosis Date  . Depression   . Diabetes mellitus without complication (HCC)   . Hypertension     Patient Active Problem List   Diagnosis Date Noted  . Left medial knee pain 07/31/2016  . Essential hypertension 11/17/2015  . Alcohol abuse   . 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 Substance induced mood disorder (HCC) 02/12/2015  . Polysubstance abuse (HCC)   . Wrist pain, chronic 12/22/2014  . Trochanteric bursitis of left hip  12/22/2014  . Seasonal allergies 09/12/2014  . Tobacco dependence 09/12/2014  . Shortness of breath 09/12/2014  . Diabetes mellitus type 2, insulin dependent (HCC) 04/01/2014  . Pain due to onychomycosis of toenail 04/01/2014  . Cocaine abuse (HCC) 04/01/2014    Past Surgical History:  Procedure Laterality Date  . CHOLECYSTECTOMY       OB History   None      Home Medications    Prior to Admission medications   Medication Sig Start Date End Date Taking? Authorizing Provider  albuterol (PROAIR HFA) 108 (90 Base) MCG/ACT inhaler INHALE 1 PUFF INTO THE LUNGS EVERY 6 HOURS AS NEEDED FOR WHEEZING OR SHORTNESS OF BREATH. 06/09/17   Fleming, Zelda W, NP  atorvastatin (LIPITOR) 40 MG tablet Take 1 tablet (40 mg total) by mouth daily. 06/09/17   Fleming, Zelda W, NP  Blood Glucose Monitoring Suppl (TRUE METRIX METER) w/Device KIT Use as directed 06/09/17   Fleming, Zelda W, NP  glucose blood (TRUE METRIX BLOOD GLUCOSE TEST) test strip 1 each by Other route 3 (three) times daily. 06/09/17   Fleming, Zelda W, NP  Insulin Glargine (LANTUS SOLOSTAR) 100 UNIT/ML Solostar Pen Inject 30 Units into the skin daily at 10 pm. 06/09/17   Fleming, Zelda W, NP  Insulin Pen Needle (ULTICARE MICRO PEN NEEDLES) 32G X 4 MM MISC 1 each by Does not apply route at bedtime. 06/09/17   Fleming, Zelda W, NP  lisinopril (PRINIVIL,ZESTRIL) 5 MG   tablet Take 1 tablet (5 mg total) by mouth daily. For blood pressure 06/09/17   Fleming, Zelda W, NP  metFORMIN (GLUCOPHAGE) 1000 MG tablet Take 1 tablet (1,000 mg total) by mouth 2 (two) times daily with a meal. 06/09/17   Fleming, Zelda W, NP  nortriptyline (PAMELOR) 50 MG capsule Take 1 capsule (50 mg total) by mouth at bedtime. 06/09/17   Fleming, Zelda W, NP  sitaGLIPtin (JANUVIA) 100 MG tablet Take 1 tablet (100 mg total) by mouth daily. 06/09/17   Fleming, Zelda W, NP  TRUEPLUS LANCETS 28G MISC 1 each by Other route 3 (three) times daily. 06/09/17   Fleming, Zelda W, NP    Family  History No family history on file.  Social History Social History   Tobacco Use  . Smoking status: Current Every Day Smoker    Packs/day: 0.25    Types: Cigarettes  . Smokeless tobacco: Current User  . Tobacco comment: states she cut back to 4 cigarettes/per day  Substance Use Topics  . Alcohol use: Yes    Alcohol/week: 0.0 oz    Comment: every other week   . Drug use: Yes    Types: Cocaine    Comment: crack last used Friday November 15, 2015     Allergies   Aspirin   Review of Systems Review of Systems  Cardiovascular: Positive for chest pain.  All other systems reviewed and are negative.    Physical Exam Updated Vital Signs Ht 5' 4" (1.626 m)   Wt 74.8 kg (165 lb)   SpO2 97% Comment: RA  BMI 28.32 kg/m   Physical Exam  Constitutional: She appears well-developed and well-nourished. No distress.  HENT:  Head: Atraumatic.  Eyes: Conjunctivae are normal.  Neck: Neck supple.  Cardiovascular: Normal rate, regular rhythm, intact distal pulses and normal pulses.  Pulmonary/Chest: Effort normal and breath sounds normal. She has no decreased breath sounds. She has no wheezes. She has no rhonchi. She has no rales. She exhibits no tenderness.  Abdominal: Soft. There is no tenderness.  Neurological:  Sleepy but easily arousable and in no acute discomfort.  Skin: No rash noted.  Psychiatric: She has a normal mood and affect.  Nursing note and vitals reviewed.    ED Treatments / Results  Labs (all labs ordered are listed, but only abnormal results are displayed) Labs Reviewed  BASIC METABOLIC PANEL - Abnormal; Notable for the following components:      Result Value   Potassium 3.3 (*)    Glucose, Bld 142 (*)    All other components within normal limits  CBC - Abnormal; Notable for the following components:   RBC 3.67 (*)    Hemoglobin 11.6 (*)    HCT 34.8 (*)    All other components within normal limits  RAPID URINE DRUG SCREEN, HOSP PERFORMED - Abnormal;  Notable for the following components:   Cocaine POSITIVE (*)    Tetrahydrocannabinol POSITIVE (*)    All other components within normal limits  I-STAT TROPONIN, ED  I-STAT BETA HCG BLOOD, ED ([facility], [facility], [facility] ONLY)  I-STAT TROPONIN, ED    EKG None   Date: 12/30/2017  Rate: 95  Rhythm: normal sinus rhythm  QRS Axis: normal  Intervals: normal  ST/T Wave abnormalities: normal  Conduction Disutrbances: none  Narrative Interpretation:   Old EKG Reviewed: No significant changes noted     Radiology Dg Chest 2 View  Result Date: 12/30/2017 CLINICAL DATA:  Acute chest pain for 1 day. EXAM: CHEST -   2 VIEW COMPARISON:  02/17/2011 radiographs FINDINGS: Cardiomegaly identified. There is no evidence of focal airspace disease, pulmonary edema, suspicious pulmonary nodule/mass, pleural effusion, or pneumothorax. No acute bony abnormalities are identified. IMPRESSION: Cardiomegaly without evidence of acute cardiopulmonary disease. Electronically Signed   By: Margarette Canada M.D.   On: 12/30/2017 12:16    Procedures Procedures (including critical care time)  Medications Ordered in ED Medications - No data to display   Initial Impression / Assessment and Plan / ED Course  I have reviewed the triage vital signs and the nursing notes.  Pertinent labs & imaging results that were available during my care of the patient were reviewed by me and considered in my medical decision making (see chart for details).     BP 135/71   Pulse 89   Temp 98.3 F (36.8 C) (Oral)   Resp (!) 23   Ht 5' 4" (1.626 m)   Wt 74.8 kg (165 lb)   SpO2 96%   BMI 28.32 kg/m    Final Clinical Impressions(s) / ED Diagnoses   Final diagnoses:  Cocaine use disorder (HCC)  Atypical chest pain    ED Discharge Orders    None     12:34 PM Patient with history of cocaine abuse here with active chest pain.  Patient is sleepy because she report not sleeping at all last night.  She does not have any significant  reproducible chest wall pain.  She is afebrile with stable normal vital sign.  Her symptom is not consistent with PE based on Wells criteria.  Will check UDS, patient is allergic to aspirin, she is not having any significant discomfort currently therefore will monitor closely and work-up initiated.  3:21 PM Patient test positive for cocaine.  She has a negative delta troponin.  Is resting comfortably currently.  I spent time encourage patient to avoid cocaine use that is can precipitate his symptoms.  However, she is stable for discharge with outpatient follow-up.   Domenic Moras, PA-C 12/30/17 1526    Tegeler, Gwenyth Allegra, MD 12/30/17 [phone]

## 2017-12-30 NOTE — ED Notes (Signed)
Pt ambulated to restroom with steady gate with nurse tech.

## 2018-08-29 ENCOUNTER — Telehealth: Payer: Self-pay | Admitting: Nurse Practitioner

## 2018-08-29 NOTE — Telephone Encounter (Signed)
1) Medication(s) Requested (by name): insulin aspart (novoLOG) injection 10 Units    2) Pharmacy of Choice: [facility]  3) Special Requests:   Approved medications will be sent to the pharmacy, we will reach out if there is an issue.  Requests made after 3pm may not be addressed until the following business day!  If a patient is unsure of the name of the medication(s) please note and ask patient to call back when they are able to provide all info, do not send to responsible party until all information is available!

## 2018-08-30 NOTE — Telephone Encounter (Signed)
It has been over a year since this patient was seen, she will have to have an appt for refills.

## 2018-10-01 ENCOUNTER — Encounter: Payer: Self-pay | Admitting: Nurse Practitioner

## 2018-10-01 ENCOUNTER — Other Ambulatory Visit: Payer: Self-pay

## 2018-10-01 ENCOUNTER — Ambulatory Visit: Payer: Self-pay | Attending: Nurse Practitioner | Admitting: Nurse Practitioner

## 2018-10-01 DIAGNOSIS — E782 Mixed hyperlipidemia: Secondary | ICD-10-CM

## 2018-10-01 DIAGNOSIS — F1994 Other psychoactive substance use, unspecified with psychoactive substance-induced mood disorder: Secondary | ICD-10-CM

## 2018-10-01 DIAGNOSIS — F324 Major depressive disorder, single episode, in partial remission: Secondary | ICD-10-CM

## 2018-10-01 DIAGNOSIS — E1165 Type 2 diabetes mellitus with hyperglycemia: Secondary | ICD-10-CM

## 2018-10-01 DIAGNOSIS — E118 Type 2 diabetes mellitus with unspecified complications: Secondary | ICD-10-CM

## 2018-10-01 DIAGNOSIS — I10 Essential (primary) hypertension: Secondary | ICD-10-CM

## 2018-10-01 MED ORDER — LISINOPRIL 10 MG PO TABS: 10.0000 mg | ORAL_TABLET | Freq: Every day | ORAL | 3 refills | Status: DC

## 2018-10-01 MED ORDER — INSULIN PEN NEEDLE 31G X 5 MM MISC: 1 refills | Status: DC

## 2018-10-01 MED ORDER — NORTRIPTYLINE HCL 50 MG PO CAPS: 50.0000 mg | ORAL_CAPSULE | Freq: Every day | ORAL | 0 refills | Status: DC

## 2018-10-01 MED ORDER — SITAGLIPTIN PHOS-METFORMIN HCL 50-1000 MG PO TABS: 1.0000 | ORAL_TABLET | Freq: Two times a day (BID) | ORAL | 3 refills | Status: DC

## 2018-10-01 MED ORDER — GLUCOSE BLOOD VI STRP: ORAL_STRIP | 12 refills | Status: DC

## 2018-10-01 MED ORDER — TRUEPLUS LANCETS 28G MISC: 3 refills | Status: DC

## 2018-10-01 MED ORDER — INSULIN DETEMIR 100 UNIT/ML FLEXPEN: 20.0000 [IU] | PEN_INJECTOR | Freq: Two times a day (BID) | SUBCUTANEOUS | 11 refills | Status: DC

## 2018-10-01 MED ORDER — ATORVASTATIN CALCIUM 40 MG PO TABS: 40.0000 mg | ORAL_TABLET | Freq: Every day | ORAL | 3 refills | Status: DC

## 2018-10-01 MED FILL — !LEVEMIR FLEXTOUCH 100 UNIT: 100 | 30 days supply | Qty: 12 | Fill #0

## 2018-10-01 MED FILL — !JANUMET 50-1000MG TABLET: 50-1000 | 30 days supply | Qty: 60 | Fill #0

## 2018-10-01 MED FILL — LISINOPRIL 10 MG TABS: 10 | 30 days supply | Qty: 30 | Fill #0

## 2018-10-01 MED FILL — TRUEPLUS PEN NDL 31GX3/16: 31G X 5 MM | 25 days supply | Qty: 100 | Fill #0

## 2018-10-01 MED FILL — NORTRIPTYLINE HCL 50 MG CAP: 50 | 30 days supply | Qty: 30 | Fill #0

## 2018-10-01 MED FILL — TRUE METRIX TEST STRIP: 50 days supply | Qty: 100 | Fill #0

## 2018-10-01 MED FILL — ATORVASTATIN CALCIUM 40 MG: 40 | 30 days supply | Qty: 30 | Fill #0

## 2018-10-01 MED FILL — TRUEPLUS PEN NDL 31GX3/16": 31G X 5 MM | 25 days supply | Qty: 100 | Fill #0

## 2018-10-01 MED FILL — TRUEplus LANCETS 28G MISC: 50 days supply | Qty: 100 | Fill #0

## 2018-10-01 NOTE — Progress Notes (Signed)
Virtual Visit via Telephone Note  I connected with Robin Montgomery on 10/01/18  at   4:10 PM EDT  EDT by telephone and verified that I am speaking with the correct person using two identifiers.   Consent I discussed the limitations, risks, security and privacy concerns of performing an evaluation and management service by telephone and the availability of in person appointments. I also discussed with the patient that there may be a patient responsible charge related to this service. The patient expressed understanding and agreed to proceed.   Location of Patient: Private  Residence   Location of Provider: [place] [facility] [employer] Office    Persons participating in Telemedicine visit: [name] FNP-BC [place]    History of Present Illness: Telemedicine visit for: DM, HTN, HPL She has a history of substance abuse (alcohol/heroin/cocaine), Tobacco dependence, Depression, HTN, HPL and DM.   DM TYPE 2 She lost her medicaid and has not been able to afford any of her medications. Has been without meds for almost 8 months. At her last office visit with me 06-09-2017 she had also been out of her medications for several months prior to her office visit.  Continues to smoke. She is unable to recall any medications that she was taking last year for her health issues. Patient has been advised to apply for financial assistance and schedule to see our financial counselor. States she could not find her meter last year. I sent her another one at that time.  Lab Results  Component Value Date   HGBA1C 11.2 06/09/2017     Essential Hypertension She does not monitor her blood pressure at home. Will resume lisinopril 10 mg daily. She has been medication and exercise non compliant. Denies chest pain, shortness of breath, palpitations, lightheadedness, dizziness, headaches or BLE edema.  BP Readings from Last 3 Encounters:  12/30/17 136/74  06/09/17 103/65  02/10/17  (!) 146/74     Hyperlipidemia LDL not at goal. She has not been compliant with taking lipitor 53m daily. She denies any statin intolerance or myalgias.  Lab Results  Component Value Date   LDLCALC 161 (H) 06/09/2017    Depression She has been out of her pamelor. Will restart at this time. She currently denies any thoughts of self harm.   Past Medical History:  Diagnosis Date  . Depression   . Diabetes mellitus without complication ([place]   . Hypertension     Past Surgical History:  Procedure Laterality Date  . CHOLECYSTECTOMY      History reviewed. No pertinent family history.  Social History   Socioeconomic History  . Marital status: Single    Spouse name: Not on file  . Number of children: Not on file  . Years of education: Not on file  . Highest education level: Not on file  Occupational History  . Not on file  Social Needs  . Financial resource strain: Not on file  . Food insecurity:    Worry: Not on file    Inability: Not on file  . Transportation needs:    Medical: Not on file    Non-medical: Not on file  Tobacco Use  . Smoking status: Current Every Day Smoker    Packs/day: 0.25    Types: Cigarettes  . Smokeless tobacco: Current User  . Tobacco comment: states she cut back to 4 cigarettes/per day  Substance and Sexual Activity  . Alcohol use: Yes    Alcohol/week: 0.0 standard drinks  Comment: every other week   . Drug use: Yes    Types: Cocaine    Comment: crack last used Friday November 15, 2015  . Sexual activity: Not on file  Lifestyle  . Physical activity:    Days per week: Not on file    Minutes per session: Not on file  . Stress: Not on file  Relationships  . Social connections:    Talks on phone: Not on file    Gets together: Not on file    Attends religious service: Not on file    Active member of club or organization: Not on file    Attends meetings of clubs or organizations: Not on file    Relationship status: Not on file  Other  Topics Concern  . Not on file  Social History Narrative   Smoker   Cocaine use off and on for past 20 years   Living in motel   Living with 58 yo and 67-something year old child      Observations/Objective: Awake, alert and oriented x 3   Review of Systems  Constitutional: Negative for fever, malaise/fatigue and weight loss.  HENT: Negative.  Negative for nosebleeds.   Eyes: Negative.  Negative for blurred vision, double vision and photophobia.  Respiratory: Negative.  Negative for cough and shortness of breath.   Cardiovascular: Negative.  Negative for chest pain, palpitations and leg swelling.  Gastrointestinal: Negative.  Negative for heartburn, nausea and vomiting.  Musculoskeletal: Negative.  Negative for myalgias.  Neurological: Negative.  Negative for dizziness, focal weakness, seizures and headaches.  Psychiatric/Behavioral: Positive for depression and substance abuse. Negative for suicidal ideas.    Assessment and Plan: Naraly was seen today for medication refill.  Diagnoses and all orders for this visit:  Poorly controlled type 2 diabetes mellitus with complication ([place]) -     CBC; Future -     CMP14+EGFR; Future -     Lipid panel; Future -     Microalbumin / creatinine urine ratio; Future -     Hemoglobin A1c; Future -     sitaGLIPtin-metformin (JANUMET) 50-1000 MG tablet; Take 1 tablet by mouth 2 (two) times daily with a meal for 30 days. -     glucose blood (TRUE METRIX BLOOD GLUCOSE TEST) test strip; Use as instructed -     TRUEplus Lancets 28G MISC; Use as instructed. Monitor blood glucose level by fingerstick twice a day -     Insulin Detemir (LEVEMIR) 100 UNIT/ML Pen; Inject 20 Units into the skin 2 (two) times daily for 30 days. -     Insulin Pen Needle (B-D UF III MINI PEN NEEDLES) 31G X 5 MM MISC; Use as instructed. Inject into the skin once nightly. Diabetes is poorly controlled. Advised patient to keep a fasting blood sugar log fast, 2 hours post lunch  and bedtime which will be reviewed at the next office visit.  Essential hypertension -     lisinopril (ZESTRIL) 10 MG tablet; Take 1 tablet (10 mg total) by mouth daily. Continue all antihypertensives as prescribed.  Remember to bring in your blood pressure log with you for your follow up appointment.  DASH/Mediterranean Diets are healthier choices for HTN.    Mixed hyperlipidemia -     atorvastatin (LIPITOR) 40 MG tablet; Take 1 tablet (40 mg total) by mouth daily. INSTRUCTIONS: Work on a low fat, heart healthy diet and participate in regular aerobic exercise program by working out at least 150 minutes per week; 5  days a week-30 minutes per day. Avoid red meat, fried foods. junk foods, sodas, sugary drinks, unhealthy snacking, alcohol and smoking.  Drink at least 48oz of water per day and monitor your carbohydrate intake daily.   Substance induced mood disorder (HCC) Declined to speak with the onsite SW today  Major depressive disorder with single episode, in partial remission ([place]) -     nortriptyline (PAMELOR) 50 MG capsule; Take 1 capsule (50 mg total) by mouth at bedtime.   Follow Up Instructions Return in about 4 weeks (around 10/29/2018).     I discussed the assessment and treatment plan with the patient. The patient was provided an opportunity to ask questions and all were answered. The patient agreed with the plan and demonstrated an understanding of the instructions.   The patient was advised to call back or seek an in-person evaluation if the symptoms worsen or if the condition fails to improve as anticipated.  I provided 40 minutes of non-face-to-face time during this encounter including median intraservice time, reviewing previous notes, labs, imaging, medications and explaining diagnosis and management.  Gildardo Pounds, FNP-BC

## 2018-10-02 ENCOUNTER — Ambulatory Visit: Payer: Medicaid Other | Attending: Family Medicine

## 2018-10-02 ENCOUNTER — Other Ambulatory Visit: Payer: Self-pay

## 2018-10-02 DIAGNOSIS — E1165 Type 2 diabetes mellitus with hyperglycemia: Secondary | ICD-10-CM

## 2018-10-02 DIAGNOSIS — E118 Type 2 diabetes mellitus with unspecified complications: Principal | ICD-10-CM

## 2018-10-03 LAB — CMP14+EGFR
ALT: 18 IU/L (ref 0–32)
AST: 11 IU/L (ref 0–40)
Albumin/Globulin Ratio: 1.8 (ref 1.2–2.2)
Albumin: 4.5 g/dL (ref 3.8–4.9)
Alkaline Phosphatase: 117 IU/L (ref 39–117)
BUN/Creatinine Ratio: 24 — ABNORMAL HIGH (ref 9–23)
BUN: 16 mg/dL (ref 6–24)
Bilirubin Total: 0.2 mg/dL (ref 0.0–1.2)
CO2: 20 mmol/L (ref 20–29)
Calcium: 9.6 mg/dL (ref 8.7–10.2)
Chloride: 94 mmol/L — ABNORMAL LOW (ref 96–106)
Creatinine, Ser: 0.67 mg/dL (ref 0.57–1.00)
GFR calc Af Amer: 113 mL/min/{1.73_m2} (ref >59)
GFR calc non Af Amer: 98 mL/min/{1.73_m2} (ref >59)
Globulin, Total: 2.5 g/dL (ref 1.5–4.5)
Glucose: 460 mg/dL — ABNORMAL HIGH (ref 65–99)
Potassium: 4.2 mmol/L (ref 3.5–5.2)
Sodium: 133 mmol/L — ABNORMAL LOW (ref 134–144)
Total Protein: 7 g/dL (ref 6.0–8.5)

## 2018-10-03 LAB — CBC
Hematocrit: 40.5 % (ref 34.0–46.6)
Hemoglobin: 13.3 g/dL (ref 11.1–15.9)
MCH: 31.1 pg (ref 26.6–33.0)
MCHC: 32.8 g/dL (ref 31.5–35.7)
MCV: 95 fL (ref 79–97)
Platelets: 373 10*3/uL (ref 150–450)
RBC: 4.27 x10E6/uL (ref 3.77–5.28)
RDW: 12 % (ref 11.7–15.4)
WBC: 7.3 10*3/uL (ref 3.4–10.8)

## 2018-10-03 LAB — LIPID PANEL
Chol/HDL Ratio: 3.9 ratio (ref 0.0–4.4)
Cholesterol, Total: 273 mg/dL — ABNORMAL HIGH (ref 100–199)
HDL: 70 mg/dL (ref >39)
LDL Calculated: 173 mg/dL — ABNORMAL HIGH (ref 0–99)
Triglycerides: 151 mg/dL — ABNORMAL HIGH (ref 0–149)
VLDL Cholesterol Cal: 30 mg/dL (ref 5–40)

## 2018-10-03 LAB — HEMOGLOBIN A1C
Est. average glucose Bld gHb Est-mCnc: 292 mg/dL
Hgb A1c MFr Bld: 11.8 % — ABNORMAL HIGH (ref 4.8–5.6)

## 2018-10-03 LAB — MICROALBUMIN / CREATININE URINE RATIO
Creatinine, Urine: 85.2 mg/dL
Microalb/Creat Ratio: 47 mg/g creat — ABNORMAL HIGH (ref 0–29)
Microalbumin, Urine: 40.2 ug/mL

## 2018-10-09 ENCOUNTER — Telehealth: Payer: Self-pay

## 2018-10-09 NOTE — Telephone Encounter (Signed)
-----   Message from Claiborne Rigg, NP sent at 10/07/2018  9:51 PM EDT ----- Urine shows microscopic kidney damage. The 10 mg lisinopril tablet will help protect your kidneys. Cholesterol levels are high and A1c has not improved. You are at high risk for stroke and a heart attack if you do not take your medications as prescribed. Will also send a cholesterol medication for you to take to lower your cholesterol levels

## 2018-10-09 NOTE — Telephone Encounter (Signed)
CMA attempt to reach patient to inform on results.  No answer and left a VM for patient to call back.  

## 2018-10-31 ENCOUNTER — Other Ambulatory Visit: Payer: Self-pay

## 2018-10-31 ENCOUNTER — Ambulatory Visit: Payer: Self-pay | Admitting: Nurse Practitioner

## 2018-10-31 ENCOUNTER — Other Ambulatory Visit: Payer: Self-pay | Admitting: Pharmacist

## 2018-10-31 MED ORDER — TRUE METRIX METER W/DEVICE KIT: PACK | 0 refills | Status: DC

## 2018-10-31 MED FILL — TRUEplus LANCETS 28G MISC: 25 days supply | Qty: 100 | Fill #0

## 2018-10-31 MED FILL — TRUE METRIX TEST STRIP: 25 days supply | Qty: 100 | Fill #0

## 2018-10-31 MED FILL — !TRUE METRIX BLOOD GLUCOSE: 1 days supply | Qty: 1 | Fill #0

## 2018-11-02 MED FILL — LISINOPRIL 10 MG TABS: 10 | 30 days supply | Qty: 30 | Fill #1

## 2018-11-02 MED FILL — !LEVEMIR FLEXTOUCH 100 UNIT: 100 | 30 days supply | Qty: 12 | Fill #1

## 2018-11-05 ENCOUNTER — Other Ambulatory Visit: Payer: Self-pay | Admitting: Nurse Practitioner

## 2018-11-05 DIAGNOSIS — F17200 Nicotine dependence, unspecified, uncomplicated: Secondary | ICD-10-CM

## 2018-11-05 MED FILL — !JANUMET 50-1000MG TABLET: 50-1000 | 30 days supply | Qty: 60 | Fill #1

## 2018-11-05 MED FILL — ATORVASTATIN CALCIUM 40 MG: 40 | 30 days supply | Qty: 30 | Fill #1

## 2018-11-08 ENCOUNTER — Other Ambulatory Visit: Payer: Self-pay | Admitting: Nurse Practitioner

## 2018-11-08 DIAGNOSIS — F324 Major depressive disorder, single episode, in partial remission: Secondary | ICD-10-CM

## 2018-11-08 MED FILL — NORTRIPTYLINE HCL 50 MG CAP: 50 | 30 days supply | Qty: 30 | Fill #0

## 2018-11-09 MED FILL — VENTOLIN HFA 90 MCG INHALER: 108 (90 BAS | 25 days supply | Qty: 18 | Fill #0

## 2018-11-13 ENCOUNTER — Ambulatory Visit: Payer: Self-pay | Admitting: Nurse Practitioner

## 2019-02-05 MED FILL — LISINOPRIL 10 MG TABS: 10 | 30 days supply | Qty: 30 | Fill #2

## 2019-04-01 MED FILL — LISINOPRIL 10 MG TABS: 10 | 30 days supply | Qty: 30 | Fill #3

## 2019-06-10 MED FILL — !LEVEMIR FLEXPEN 100UNITS/M: 100U/ML (3) | 30 days supply | Qty: 12 | Fill #3

## 2019-06-10 MED FILL — LISINOPRIL 10 MG TABS: 10 | 30 days supply | Qty: 30 | Fill #4

## 2019-07-15 MED FILL — !LEVEMIR FLEXPEN 100UNITS/M: 100U/ML (3) | 30 days supply | Qty: 12 | Fill #4

## 2019-07-15 MED FILL — LISINOPRIL 10 MG TABS: 10 | 30 days supply | Qty: 30 | Fill #5

## 2019-08-19 ENCOUNTER — Emergency Department (HOSPITAL_COMMUNITY)
Admission: EM | Admit: 2019-08-19 | Discharge: 2019-08-19 | Disposition: A | Discharge status: Home / Self Care | Payer: Medicaid Other | Attending: Emergency Medicine | Admitting: Emergency Medicine

## 2019-08-19 ENCOUNTER — Encounter (HOSPITAL_COMMUNITY): Payer: Self-pay | Admitting: Emergency Medicine

## 2019-08-19 ENCOUNTER — Other Ambulatory Visit: Payer: Self-pay

## 2019-08-19 DIAGNOSIS — I10 Essential (primary) hypertension: Secondary | ICD-10-CM | POA: Insufficient documentation

## 2019-08-19 DIAGNOSIS — E119 Type 2 diabetes mellitus without complications: Secondary | ICD-10-CM | POA: Diagnosis not present

## 2019-08-19 DIAGNOSIS — Z79899 Other long term (current) drug therapy: Secondary | ICD-10-CM | POA: Diagnosis not present

## 2019-08-19 DIAGNOSIS — K0889 Other specified disorders of teeth and supporting structures: Secondary | ICD-10-CM | POA: Diagnosis present

## 2019-08-19 DIAGNOSIS — F17210 Nicotine dependence, cigarettes, uncomplicated: Secondary | ICD-10-CM | POA: Diagnosis not present

## 2019-08-19 DIAGNOSIS — Z794 Long term (current) use of insulin: Secondary | ICD-10-CM | POA: Insufficient documentation

## 2019-08-19 MED ORDER — NAPROXEN 500 MG PO TABS: 500.0000 mg | ORAL_TABLET | Freq: Two times a day (BID) | ORAL | 0 refills | Status: DC

## 2019-08-19 MED ORDER — AMOXICILLIN 500 MG PO CAPS: 500.0000 mg | ORAL_CAPSULE | Freq: Three times a day (TID) | ORAL | 0 refills | Status: DC

## 2019-08-19 MED ORDER — OXYCODONE-ACETAMINOPHEN 5-325 MG PO TABS
1.0000 | ORAL_TABLET | Freq: Once | ORAL | Status: AC
  Administered 2019-08-19: 1 via ORAL
  Filled 2019-08-19: qty 1

## 2019-08-19 MED ORDER — ACETAMINOPHEN 500 MG PO TABS: 500.0000 mg | ORAL_TABLET | Freq: Four times a day (QID) | ORAL | 0 refills | Status: DC | PRN

## 2019-08-19 MED ORDER — OMEPRAZOLE 20 MG PO CPDR: 40.0000 mg | DELAYED_RELEASE_CAPSULE | Freq: Every day | ORAL | 0 refills | Status: DC

## 2019-08-19 MED FILL — LISINOPRIL 10 MG TABS: 10 | 30 days supply | Qty: 30 | Fill #6

## 2019-08-19 MED FILL — NAPROXEN 500 MG TABLET: 500 | 15 days supply | Qty: 30 | Fill #0

## 2019-08-19 MED FILL — OMEPRAZOLE 20 MG CAP: 20 | 30 days supply | Qty: 60 | Fill #0

## 2019-08-19 MED FILL — AMOXICILLIN 500 MG CAPSULE: 500 | 7 days supply | Qty: 21 | Fill #0

## 2019-08-19 NOTE — ED Triage Notes (Signed)
Have been have dental pain for a while and past week pain increasing 10/10 throbbing.

## 2019-08-19 NOTE — ED Provider Notes (Signed)
[facility] EMERGENCY DEPARTMENT Provider Note   CSN: [id number] Arrival date & time: 08/19/19  1146     History Chief Complaint  Patient presents with  . Dental Pain    Robin Montgomery is a 59 y.o. female with PMH significant for IDDM, HTN, HLD, and substance use disorder presents to the ED with complaints of 10 out of 10 dental pain.  Patient reports that she has a history of poor dentition and was scheduled to have dental extractions performed, but that her Medicaid insurance was changed.  She is currently in the process of acquiring disability insurance, however it may be a while before she receives her necessary procedural intervention.  She reports that for the past two days she has been taking far more NSAIDs than she knows is appropriate and is endorsing some mild reflux and gastritis symptoms.  No frank melena.  It has been difficult for her to eat these past couple of days because she has pain on both sides of her mouth.  She states that she has numerous broken teeth, but denies any masses, abscess, discharge, difficulty breathing, sore throat, wheezing, fevers or chills, neck pain, drooling, trismus, or other symptoms.  HPI     Past Medical History:  Diagnosis Date  . Depression   . Diabetes mellitus without complication ([place])   . Hypertension     Patient Active Problem List   Diagnosis Date Noted  . Left medial knee pain 07/31/2016  . Essential hypertension 11/17/2015  . Alcohol abuse   . MDD (major depressive disorder), recurrent severe, without psychosis ([place]) 02/12/2015  . Substance induced mood disorder ([place]) 02/12/2015  . Polysubstance abuse ([place])   . Wrist pain, chronic 12/22/2014  . Trochanteric bursitis of left hip 12/22/2014  . Seasonal allergies 09/12/2014  . Tobacco dependence 09/12/2014  . Shortness of breath 09/12/2014  . Diabetes mellitus type 2, insulin dependent ([place]) 04/01/2014  . Pain due to onychomycosis of toenail 04/01/2014    . Cocaine abuse ([place]) 04/01/2014    Past Surgical History:  Procedure Laterality Date  . CHOLECYSTECTOMY       OB History   No obstetric history on file.     No family history on file.  Social History   Tobacco Use  . Smoking status: Current Every Day Smoker    Packs/day: 0.25    Types: Cigarettes  . Smokeless tobacco: Current User  . Tobacco comment: states she cut back to 4 cigarettes/per day  Substance Use Topics  . Alcohol use: Yes    Alcohol/week: 0.0 standard drinks    Comment: every other week   . Drug use: Yes    Types: Cocaine    Comment: crack last used Friday November 15, 2015    Home Medications Prior to Admission medications   Medication Sig Start Date End Date Taking? Authorizing Provider  acetaminophen (TYLENOL) 500 MG tablet Take 1 tablet (500 mg total) by mouth every 6 (six) hours as needed. 08/19/19   Corena Herter, PA-C  albuterol (VENTOLIN HFA) 108 (90 Base) MCG/ACT inhaler INHALE 1 PUFF INTO THE LUNGS EVERY 6 HOURS AS NEEDED FOR WHEEZING OR SHORTNESS OF BREATH. 11/08/18   Gildardo Pounds, NP  amoxicillin (AMOXIL) 500 MG capsule Take 1 capsule (500 mg total) by mouth 3 (three) times daily. 08/19/19   Corena Herter, PA-C  atorvastatin (LIPITOR) 40 MG tablet Take 1 tablet (40 mg total) by mouth daily. 10/01/18   Gildardo Pounds, NP  Blood Glucose Monitoring Suppl (TRUE METRIX METER) w/Device KIT Use to check blood sugar daily. 10/31/18   Charlott Rakes, MD  glucose blood (TRUE METRIX BLOOD GLUCOSE TEST) test strip Use as instructed 10/01/18   Gildardo Pounds, NP  ibuprofen (ADVIL,MOTRIN) 200 MG tablet Take 600 mg by mouth every 6 (six) hours as needed for fever or moderate pain.    [provider]  Insulin Detemir (LEVEMIR) 100 UNIT/ML Pen Inject 20 Units into the skin 2 (two) times daily for 30 days. 10/01/18 10/31/18  Gildardo Pounds, NP  Insulin Pen Needle (B-D UF III MINI PEN NEEDLES) 31G X 5 MM MISC Use as instructed. Inject into the skin  once nightly. 10/01/18   Gildardo Pounds, NP  lisinopril (ZESTRIL) 10 MG tablet Take 1 tablet (10 mg total) by mouth daily. 10/01/18   Gildardo Pounds, NP  naproxen (NAPROSYN) 500 MG tablet Take 1 tablet (500 mg total) by mouth 2 (two) times daily. 08/19/19   Corena Herter, PA-C  nortriptyline (PAMELOR) 50 MG capsule TAKE 1 CAPSULE (50 MG TOTAL) BY MOUTH AT BEDTIME. 11/08/18   Gildardo Pounds, NP  omeprazole (PRILOSEC) 20 MG capsule Take 2 capsules (40 mg total) by mouth daily. 08/19/19   Corena Herter, PA-C  sitaGLIPtin-metformin (JANUMET) 50-1000 MG tablet Take 1 tablet by mouth 2 (two) times daily with a meal for 30 days. 10/01/18 10/31/18  Gildardo Pounds, NP  TRUEplus Lancets 28G MISC Use as instructed. Monitor blood glucose level by fingerstick twice a day 10/01/18   Gildardo Pounds, NP    Allergies    Aspirin  Review of Systems   Review of Systems  Constitutional: Negative for fever.  HENT: Positive for dental problem. Negative for drooling, facial swelling, sore throat, trouble swallowing and voice change.   Respiratory: Negative for wheezing.     Physical Exam Updated Vital Signs BP (!) 193/102   Pulse 84   Temp 97.6 F (36.4 C) (Oral)   Resp 18   Ht '5\' 2"'  (1.575 m)   Wt 69.9 kg   SpO2 99%   BMI 28.17 kg/m   Physical Exam Vitals and nursing note reviewed. Exam conducted with a chaperone present.  Constitutional:      Appearance: Normal appearance. She is not ill-appearing.  HENT:     Head: Normocephalic and atraumatic.     Mouth/Throat:     Comments: Poor dentition throughout.  Numerous previous extractions and only 6 remaining teeth, all of which are either broken or decaying.  Most of her tenderness is around her #4 in #20 teeth, which have surrounding erythema, but no mass or fluctuance concerning for abscess.  Her oropharynx is patent, nonerythematous.  No tonsillar reports fevers is noted.  No tongue swelling.  No floor of mouth induration.  Mild  halitosis. Eyes:     General: No scleral icterus.    Conjunctiva/sclera: Conjunctivae normal.  Neck:     Comments: No meningismus. Cardiovascular:     Rate and Rhythm: Normal rate and regular rhythm.     Pulses: Normal pulses.     Heart sounds: Normal heart sounds.  Pulmonary:     Effort: Pulmonary effort is normal. No respiratory distress.     Breath sounds: Normal breath sounds. No wheezing.  Musculoskeletal:     Cervical back: Normal range of motion and neck supple. No rigidity.  Skin:    General: Skin is dry.     Capillary Refill: Capillary refill takes  less than 2 seconds.  Neurological:     Mental Status: She is alert.     GCS: GCS eye subscore is 4. GCS verbal subscore is 5. GCS motor subscore is 6.  Psychiatric:        Mood and Affect: Mood normal.        Behavior: Behavior normal.        Thought Content: Thought content normal.     ED Results / Procedures / Treatments   Labs (all labs ordered are listed, but only abnormal results are displayed) Labs Reviewed - No data to display  EKG None  Radiology No results found.  Procedures Procedures (including critical care time)  Medications Ordered in ED Medications  oxyCODONE-acetaminophen (PERCOCET/ROXICET) 5-325 MG per tablet 1 tablet (1 tablet Oral Given 08/19/19 1257)    ED Course  I have reviewed the triage vital signs and the nursing notes.  Pertinent labs & imaging results that were available during my care of the patient were reviewed by me and considered in my medical decision making (see chart for details).    MDM Rules/Calculators/A&P                      Patient denies any history of PUD or CKD.  I feel as though it is reasonable for her to continue taking NSAIDs so long as she is also taking a proton pump inhibitor to help hedge against risk of PUD.  She denies any alcohol use, but endorses smoking regularly.  Cautioned her on risk of continuing to smoke in presence of her dental infection.   Encouraged her to cease her tobacco use.    We will administer pain medication here in the ED and discharge her home with combination of Tylenol and naproxen in addition to omeprazole.  Will also prescribe her amoxicillin for her suspected odontogenic infection.  Her blood pressure was elevated here in the ED, but she notes that she is taking her medications regularly and is followed by her primary care provider at [facility].  I encouraged her to follow-up with her PCP regarding today's elevated pressure, but suspect that is likely at least partially attributable to her 10 out of 10 dental pain.  Provided patient with dental resources.  Strict ED return precautions discussed.  Patient voices understanding is agreeable to plan.  Final Clinical Impression(s) / ED Diagnoses Final diagnoses:  Pain, dental    Rx / DC Orders ED Discharge Orders         Ordered    naproxen (NAPROSYN) 500 MG tablet  2 times daily     08/19/19 1247    acetaminophen (TYLENOL) 500 MG tablet  Every 6 hours PRN     08/19/19 1247    omeprazole (PRILOSEC) 20 MG capsule  Daily     08/19/19 1247    amoxicillin (AMOXIL) 500 MG capsule  3 times daily     08/19/19 1247           Corena Herter, PA-C 08/19/19 1321    Sherwood Gambler, MD 08/19/19 1329

## 2019-08-19 NOTE — ED Notes (Signed)
Pt states she takes 6 or 7 advil at a time throughout the day at home for this to stop the pain for "about 10 or 15 min." Provided counseling about risk for GI ulceration from this behavior; pt responds "I've been having stomach pain and issues, but its worth it to make this pain stop." Encouraged pt to take as directed on OTC bottle and to alternate with tylenol.

## 2019-08-19 NOTE — Discharge Instructions (Addendum)
Please take your medications, as prescribed.  Discontinue any other NSAIDs/pain medication.  Discontinue naproxen immediately should you develop any worsening belly pain or dark black stools.  You will need dental intervention in order to definitively improve your symptoms.  Pain medications are simply a band-aid until extractions are performed.  Please follow-up with your primary care provider regarding today's encounter and to discuss your elevated blood pressure.  Return to the ED or seek immediate medical attention for any new or worsening symptoms.  Please read the attachment on dental resources as well as the resource listed below: Please follow-up with one of the dental clinics provided to you below or in your paperwork. Call and tell them you were seen in the Emergency Dept and arrange for an appointment. You may have to call multiple places in order to find a place to be seen.  Dental Assistance If the dentist on-call cannot see you, please use the resources below:   Patients with Medicaid: [facility] [phone], [phone] [address], [phone]  If unable to pay, or uninsured, contact [facility] [phone]) or [facility] Department [phone] in [place], [phone] in [facility]) to become qualified for the adult dental clinic  Other Low-Cost Community Dental Services: [facility]- [address] Natasha Bence [place], [place], [address]    [phone], Ext. [phone]    2nd and 4th Thursday of the month at 6:30am    10 clients each day by appointment, can sometimes see walk-in     patients if someone does not show for an appointment [facility]- [address]ins [place], [place], [address]    [phone] [facility] [address] [place], [place], [address]    [phone]  [facility] Department- [phone] [facility] Health Department- [phone] [facility] Department- [phone]

## 2019-08-19 NOTE — ED Notes (Signed)
Patient verbalizes understanding of discharge instructions. Opportunity for questioning and answers were provided. Armband removed by staff, pt discharged from ED to pharmacy then home.Pt states she will walk across the street to pick up rx and then will be picked up by family from there. Ambulates with ease independently. States "I can see them in the car over there already!"

## 2019-08-28 ENCOUNTER — Other Ambulatory Visit: Payer: Self-pay

## 2019-08-28 ENCOUNTER — Ambulatory Visit: Payer: Self-pay | Attending: Nurse Practitioner | Admitting: Nurse Practitioner

## 2019-08-28 ENCOUNTER — Encounter: Payer: Self-pay | Admitting: Nurse Practitioner

## 2019-08-28 ENCOUNTER — Other Ambulatory Visit: Payer: Self-pay | Admitting: Nurse Practitioner

## 2019-08-28 DIAGNOSIS — K219 Gastro-esophageal reflux disease without esophagitis: Secondary | ICD-10-CM

## 2019-08-28 DIAGNOSIS — E782 Mixed hyperlipidemia: Secondary | ICD-10-CM

## 2019-08-28 DIAGNOSIS — F324 Major depressive disorder, single episode, in partial remission: Secondary | ICD-10-CM

## 2019-08-28 DIAGNOSIS — F17200 Nicotine dependence, unspecified, uncomplicated: Secondary | ICD-10-CM

## 2019-08-28 DIAGNOSIS — E1165 Type 2 diabetes mellitus with hyperglycemia: Secondary | ICD-10-CM

## 2019-08-28 DIAGNOSIS — I10 Essential (primary) hypertension: Secondary | ICD-10-CM

## 2019-08-28 DIAGNOSIS — D649 Anemia, unspecified: Secondary | ICD-10-CM

## 2019-08-28 DIAGNOSIS — Z1211 Encounter for screening for malignant neoplasm of colon: Secondary | ICD-10-CM

## 2019-08-28 DIAGNOSIS — Z794 Long term (current) use of insulin: Secondary | ICD-10-CM

## 2019-08-28 MED ORDER — ALBUTEROL SULFATE HFA 108 (90 BASE) MCG/ACT IN AERS: INHALATION_SPRAY | RESPIRATORY_TRACT | 1 refills | Status: DC

## 2019-08-28 MED ORDER — ATORVASTATIN CALCIUM 40 MG PO TABS: 40.0000 mg | ORAL_TABLET | Freq: Every day | ORAL | 3 refills | Status: DC

## 2019-08-28 MED ORDER — JANUMET 50-1000 MG PO TABS: 1.0000 | ORAL_TABLET | Freq: Two times a day (BID) | ORAL | 3 refills | Status: DC

## 2019-08-28 MED ORDER — NORTRIPTYLINE HCL 50 MG PO CAPS: 50.0000 mg | ORAL_CAPSULE | Freq: Every day | ORAL | 2 refills | Status: DC

## 2019-08-28 MED ORDER — IBUPROFEN 600 MG PO TABS: 600.0000 mg | ORAL_TABLET | Freq: Three times a day (TID) | ORAL | 0 refills | Status: AC | PRN

## 2019-08-28 MED ORDER — OMEPRAZOLE 20 MG PO CPDR: 40.0000 mg | DELAYED_RELEASE_CAPSULE | Freq: Every day | ORAL | 0 refills | Status: DC

## 2019-08-28 MED ORDER — TRUEPLUS LANCETS 28G MISC: 3 refills | Status: DC

## 2019-08-28 MED FILL — !VENTOLIN HFA INHALER: 108 (90 BAS | 25 days supply | Qty: 18 | Fill #0

## 2019-08-28 MED FILL — JANUMET 50-1,000 MG TABLET: 50-1000 | 30 days supply | Qty: 60 | Fill #0

## 2019-08-28 MED FILL — TRUEplus LANCETS 28G MISC: 50 days supply | Qty: 100 | Fill #0

## 2019-08-28 MED FILL — IBUPROFEN 600 MG TABLET: 600 | 20 days supply | Qty: 60 | Fill #0

## 2019-08-28 MED FILL — NORTRIPTYLINE HCL 50 MG CAP: 50 | 30 days supply | Qty: 30 | Fill #0

## 2019-08-28 MED FILL — ATORVASTATIN CALCIUM 40 MG: 40 | 30 days supply | Qty: 30 | Fill #0

## 2019-08-28 NOTE — Progress Notes (Signed)
Virtual Visit via Telephone Note Due to national recommendations of social distancing due to [place] 19, telehealth visit is felt to be most appropriate for this patient at this time.  I discussed the limitations, risks, security and privacy concerns of performing an evaluation and management service by telephone and the availability of in person appointments. I also discussed with the patient that there may be a patient responsible charge related to this service. The patient expressed understanding and agreed to proceed.    I connected with Robin Montgomery on 08/28/19  at   9:50 AM EDT  EDT by telephone and verified that I am speaking with the correct person using two identifiers.   Consent I discussed the limitations, risks, security and privacy concerns of performing an evaluation and management service by telephone and the availability of in person appointments. I also discussed with the patient that there may be a patient responsible charge related to this service. The patient expressed understanding and agreed to proceed.   Location of Patient: Private  Residence    Location of Provider: [place] [facility] [employer] Office    Persons participating in Telemedicine visit: [name]    History of Present Illness: Telemedicine visit for: Follow Up  Essential Hypertension Currently taking lisinopril 10 mg daily as prescribed. Denies chest pain, shortness of breath, palpitations, lightheadedness, dizziness, or BLE edema. She does not monitor her blood pressure at home as she does not have a device. Endorses headache today and not feeling well.  BP Readings from Last 3 Encounters:  08/19/19 (!) 193/102  12/30/17 136/74  06/09/17 103/65     DM TYPE 2 She is only taking levemir once a day instead of twice per day. I had changed her lantus dosing to twice a day last April. She is not taking janumet 50-1000 mg BID at this time. She states  her CBGs have been running high and then states she doesn't like sticking herself with needles and doesn't know how to use her meter. So unclear if she is actually checking her blood glucose levels or just endorsing high due to symptoms of hyperglycemia. Overdue for eye exam.  Referral placed today.  Lab Results  Component Value Date   HGBA1C 11.8 (H) 10/02/2018   Dyslipidemia LDL not at goal of <70. She has not been adherent with taking atorvastatin 40 mg daily or her diet. Denies statin intolerance.  Lab Results  Component Value Date   LDLCALC 173 (H) 10/02/2018     Past Medical History:  Diagnosis Date  . Depression   . Diabetes mellitus without complication ([place])   . Hypertension     Past Surgical History:  Procedure Laterality Date  . CHOLECYSTECTOMY      History reviewed. No pertinent family history.  Social History   Socioeconomic History  . Marital status: Single    Spouse name: Not on file  . Number of children: Not on file  . Years of education: Not on file  . Highest education level: Not on file  Occupational History  . Not on file  Tobacco Use  . Smoking status: Current Every Day Smoker    Packs/day: 0.25    Types: Cigarettes  . Smokeless tobacco: Current User  . Tobacco comment: states she cut back to 4 cigarettes/per day  Substance and Sexual Activity  . Alcohol use: Yes    Alcohol/week: 0.0 standard drinks    Comment: every other week   .  Drug use: Not Currently    Types: Cocaine    Comment: crack last used Friday November 15, 2015  . Sexual activity: Not on file  Other Topics Concern  . Not on file  Social History Narrative   Smoker   Cocaine use off and on for past 20 years   Living in motel   Living with 59 yo and 53-something year old child    Social Determinants of Health   Financial Resource Strain:   . Difficulty of Paying Living Expenses:   Food Insecurity:   . Worried About Charity fundraiser in the Last Year:   . Arboriculturist in  the Last Year:   Transportation Needs:   . Film/video editor (Medical):   Marland Kitchen Lack of Transportation (Non-Medical):   Physical Activity:   . Days of Exercise per Week:   . Minutes of Exercise per Session:   Stress:   . Feeling of Stress :   Social Connections:   . Frequency of Communication with Friends and Family:   . Frequency of Social Gatherings with Friends and Family:   . Attends Religious Services:   . Active Member of Clubs or Organizations:   . Attends Archivist Meetings:   Marland Kitchen Marital Status:      Observations/Objective: Awake, alert and oriented x 3   Review of Systems  Constitutional: Negative for fever, malaise/fatigue and weight loss.  HENT: Negative.  Negative for nosebleeds.   Eyes: Negative.  Negative for blurred vision, double vision and photophobia.  Respiratory: Negative.  Negative for cough and shortness of breath.   Cardiovascular: Negative.  Negative for chest pain, palpitations and leg swelling.  Gastrointestinal: Positive for heartburn. Negative for nausea and vomiting.  Musculoskeletal: Negative.  Negative for myalgias.  Neurological: Positive for headaches. Negative for dizziness, focal weakness and seizures.  Psychiatric/Behavioral: Positive for depression. Negative for suicidal ideas.    Assessment and Plan: Robin Montgomery was seen today for follow-up.  Diagnoses and all orders for this visit:  Type 2 diabetes mellitus with hyperglycemia, with long-term current use of insulin ([place]) -     Ambulatory referral to Ophthalmology -     sitaGLIPtin-metformin (JANUMET) 50-1000 MG tablet; Take 1 tablet by mouth 2 (two) times daily with a meal. -     TRUEplus Lancets 28G MISC; Use as instructed. Monitor blood glucose level by fingerstick twice a day -     Hemoglobin A1c; Future -     CMP14+EGFR; Future -     Microalbumin / creatinine urine ratio; Future Continue blood sugar control as discussed in office today, low carbohydrate diet, and regular  physical exercise as tolerated, 150 minutes per week (30 min each day, 5 days per week, or 50 min 3 days per week). Keep blood sugar logs with fasting goal of 90-130 mg/dl, post prandial (after you eat) less than 180.  For Hypoglycemia: BS <60 and Hyperglycemia BS >400; contact the clinic ASAP. Annual eye exams and foot exams are recommended.   Essential hypertension -     CMP14+EGFR; Future Continue all antihypertensives as prescribed.  Remember to bring in your blood pressure log with you for your follow up appointment.  DASH/Mediterranean Diets are healthier choices for HTN.     Mixed hyperlipidemia -     Lipid panel; Future -     atorvastatin (LIPITOR) 40 MG tablet; Take 1 tablet (40 mg total) by mouth daily. INSTRUCTIONS: Work on a low fat, heart healthy diet and  participate in regular aerobic exercise program by working out at least 150 minutes per week; 5 days a week-30 minutes per day. Avoid red meat/beef/steak,  fried foods. junk foods, sodas, sugary drinks, unhealthy snacking, alcohol and smoking.  Drink at least 80 oz of water per day and monitor your carbohydrate intake daily.    Colon cancer screening -     Fecal occult blood, imunochemical; Future  Anemia, unspecified type -     CBC; Future  Tobacco dependence -     albuterol (VENTOLIN HFA) 108 (90 Base) MCG/ACT inhaler; INHALE 1 PUFF INTO THE LUNGS EVERY 6 HOURS AS NEEDED FOR WHEEZING OR SHORTNESS OF BREATH. Robin Montgomery was counseled on the dangers of tobacco use, and was advised to quit. Reviewed strategies to maximize success, including removing cigarettes and smoking materials from environment, stress management and support of family/friends as well as pharmacological alternatives including: Wellbutrin, Chantix, Nicotine patch, Nicotine gum or lozenges. Smoking cessation support: smoking cessation hotline: 1-800-QUIT-NOW.  Smoking cessation classes are also available through [facility]. Call  [phone] or visit our website at [email or web address]   A total of 3 minutes was spent on counseling for smoking cessation and Robin Montgomery is not ready to quit.   Major depressive disorder with single episode, in partial remission (HCC) -     nortriptyline (PAMELOR) 50 MG capsule; Take 1 capsule (50 mg total) by mouth at bedtime.  GERD without esophagitis -     omeprazole (PRILOSEC) 20 MG capsule; Take 2 capsules (40 mg total) by mouth daily. INSTRUCTIONS: Avoid GERD Triggers: acidic, spicy or fried foods, caffeine, coffee, sodas,  alcohol and chocolate.   Other orders -     ibuprofen (ADVIL) 600 MG tablet; Take 1 tablet (600 mg total) by mouth every 8 (eight) hours as needed for fever, headache or moderate pain.     Follow Up Instructions Return in about 3 months (around 11/28/2019).     I discussed the assessment and treatment plan with the patient. The patient was provided an opportunity to ask questions and all were answered. The patient agreed with the plan and demonstrated an understanding of the instructions.   The patient was advised to call back or seek an in-person evaluation if the symptoms worsen or if the condition fails to improve as anticipated.  I provided 19 minutes of non-face-to-face time during this encounter including median intraservice time, reviewing previous notes, labs, imaging, medications and explaining diagnosis and management.  Robin Pounds, FNP-BC

## 2019-09-04 ENCOUNTER — Ambulatory Visit: Payer: Medicaid Other | Admitting: Pharmacist

## 2019-09-04 ENCOUNTER — Other Ambulatory Visit: Payer: Medicaid Other

## 2019-09-11 ENCOUNTER — Ambulatory Visit: Payer: Medicaid Other | Admitting: Pharmacist

## 2019-09-11 ENCOUNTER — Other Ambulatory Visit: Payer: Medicaid Other

## 2019-09-16 ENCOUNTER — Telehealth: Payer: Self-pay

## 2019-09-16 NOTE — Telephone Encounter (Signed)
Telephoned home phone number. Left a message with her daughter, contact [facility] to schedule appt.

## 2019-09-30 MED FILL — LEVEMIR FLEXTOUCH 100 UNITS: 100 | 30 days supply | Qty: 12 | Fill #5

## 2019-09-30 MED FILL — LISINOPRIL 10 MG TABS: 10 | 30 days supply | Qty: 30 | Fill #7

## 2019-11-11 ENCOUNTER — Other Ambulatory Visit: Payer: Self-pay | Admitting: Nurse Practitioner

## 2019-11-11 DIAGNOSIS — I10 Essential (primary) hypertension: Secondary | ICD-10-CM

## 2019-11-11 DIAGNOSIS — E118 Type 2 diabetes mellitus with unspecified complications: Secondary | ICD-10-CM

## 2019-11-11 DIAGNOSIS — E1165 Type 2 diabetes mellitus with hyperglycemia: Secondary | ICD-10-CM

## 2019-11-18 ENCOUNTER — Ambulatory Visit: Payer: Medicaid Other | Attending: Nurse Practitioner

## 2019-11-18 ENCOUNTER — Telehealth: Payer: Self-pay | Admitting: Nurse Practitioner

## 2019-11-18 ENCOUNTER — Other Ambulatory Visit: Payer: Self-pay

## 2019-11-18 DIAGNOSIS — I10 Essential (primary) hypertension: Secondary | ICD-10-CM

## 2019-11-18 DIAGNOSIS — D649 Anemia, unspecified: Secondary | ICD-10-CM

## 2019-11-18 DIAGNOSIS — Z1211 Encounter for screening for malignant neoplasm of colon: Secondary | ICD-10-CM

## 2019-11-18 DIAGNOSIS — E782 Mixed hyperlipidemia: Secondary | ICD-10-CM

## 2019-11-18 DIAGNOSIS — E1165 Type 2 diabetes mellitus with hyperglycemia: Secondary | ICD-10-CM

## 2019-11-18 MED FILL — LISINOPRIL 10 MG TABS: 10 | 30 days supply | Qty: 30 | Fill #0

## 2019-11-18 NOTE — Telephone Encounter (Signed)
Patient came into the office and requested for a referral for an eye and ear specialist. Please follow up at your earliest convenience.

## 2019-11-19 LAB — CBC
Hematocrit: 39.6 % (ref 34.0–46.6)
Hemoglobin: 13 g/dL (ref 11.1–15.9)
MCH: 31.2 pg (ref 26.6–33.0)
MCHC: 32.8 g/dL (ref 31.5–35.7)
MCV: 95 fL (ref 79–97)
Platelets: 407 10*3/uL (ref 150–450)
RBC: 4.17 x10E6/uL (ref 3.77–5.28)
RDW: 12.3 % (ref 11.7–15.4)
WBC: 8.1 10*3/uL (ref 3.4–10.8)

## 2019-11-19 LAB — CMP14+EGFR
ALT: 7 IU/L (ref 0–32)
AST: 7 IU/L (ref 0–40)
Albumin/Globulin Ratio: 1.6 (ref 1.2–2.2)
Albumin: 4.3 g/dL (ref 3.8–4.9)
Alkaline Phosphatase: 104 IU/L (ref 48–121)
BUN/Creatinine Ratio: 15 (ref 9–23)
BUN: 8 mg/dL (ref 6–24)
Bilirubin Total: <0.2 mg/dL (ref 0.0–1.2)
CO2: 21 mmol/L (ref 20–29)
Calcium: 9.6 mg/dL (ref 8.7–10.2)
Chloride: 104 mmol/L (ref 96–106)
Creatinine, Ser: 0.53 mg/dL — ABNORMAL LOW (ref 0.57–1.00)
GFR calc Af Amer: 120 mL/min/{1.73_m2} (ref >59)
GFR calc non Af Amer: 104 mL/min/{1.73_m2} (ref >59)
Globulin, Total: 2.7 g/dL (ref 1.5–4.5)
Glucose: 349 mg/dL — ABNORMAL HIGH (ref 65–99)
Potassium: 4.3 mmol/L (ref 3.5–5.2)
Sodium: 135 mmol/L (ref 134–144)
Total Protein: 7 g/dL (ref 6.0–8.5)

## 2019-11-19 LAB — HEMOGLOBIN A1C
Est. average glucose Bld gHb Est-mCnc: 255 mg/dL
Hgb A1c MFr Bld: 10.5 % — ABNORMAL HIGH (ref 4.8–5.6)

## 2019-11-19 LAB — LIPID PANEL
Chol/HDL Ratio: 4.3 ratio (ref 0.0–4.4)
Cholesterol, Total: 263 mg/dL — ABNORMAL HIGH (ref 100–199)
HDL: 61 mg/dL (ref >39)
LDL Chol Calc (NIH): 154 mg/dL — ABNORMAL HIGH (ref 0–99)
Triglycerides: 265 mg/dL — ABNORMAL HIGH (ref 0–149)
VLDL Cholesterol Cal: 48 mg/dL — ABNORMAL HIGH (ref 5–40)

## 2019-11-19 LAB — MICROALBUMIN / CREATININE URINE RATIO
Creatinine, Urine: 44.5 mg/dL
Microalb/Creat Ratio: 11 mg/g creat (ref 0–29)
Microalbumin, Urine: 4.8 ug/mL

## 2019-11-20 ENCOUNTER — Other Ambulatory Visit: Payer: Self-pay | Admitting: Nurse Practitioner

## 2019-11-20 DIAGNOSIS — Z1211 Encounter for screening for malignant neoplasm of colon: Secondary | ICD-10-CM

## 2019-11-20 DIAGNOSIS — Z794 Long term (current) use of insulin: Secondary | ICD-10-CM

## 2019-11-20 DIAGNOSIS — E119 Type 2 diabetes mellitus without complications: Secondary | ICD-10-CM

## 2019-11-20 DIAGNOSIS — Z1231 Encounter for screening mammogram for malignant neoplasm of breast: Secondary | ICD-10-CM

## 2019-11-22 NOTE — Telephone Encounter (Signed)
Attempt to reach on why she needed a referral to eye doctor and ENT. No answer and LVM.

## 2019-11-28 NOTE — Telephone Encounter (Signed)
Patient returned nurse call regarding results. Please f/u  °

## 2019-12-02 NOTE — Telephone Encounter (Signed)
Spoke to patient and informed on results. Pt. Understood.  

## 2019-12-03 ENCOUNTER — Other Ambulatory Visit: Payer: Self-pay

## 2019-12-03 ENCOUNTER — Other Ambulatory Visit: Payer: Self-pay | Admitting: Pharmacist

## 2019-12-03 ENCOUNTER — Ambulatory Visit: Payer: Medicaid Other | Attending: Nurse Practitioner | Admitting: Nurse Practitioner

## 2019-12-03 ENCOUNTER — Other Ambulatory Visit: Payer: Self-pay | Admitting: Nurse Practitioner

## 2019-12-03 ENCOUNTER — Encounter: Payer: Self-pay | Admitting: Nurse Practitioner

## 2019-12-03 VITALS — BP 156/82 | HR 90 | Temp 97.7°F | Ht 63.39 in | Wt 147.0 lb

## 2019-12-03 DIAGNOSIS — E1165 Type 2 diabetes mellitus with hyperglycemia: Secondary | ICD-10-CM | POA: Diagnosis not present

## 2019-12-03 DIAGNOSIS — Z79899 Other long term (current) drug therapy: Secondary | ICD-10-CM | POA: Insufficient documentation

## 2019-12-03 DIAGNOSIS — F324 Major depressive disorder, single episode, in partial remission: Secondary | ICD-10-CM

## 2019-12-03 DIAGNOSIS — Z886 Allergy status to analgesic agent status: Secondary | ICD-10-CM | POA: Diagnosis not present

## 2019-12-03 DIAGNOSIS — E782 Mixed hyperlipidemia: Secondary | ICD-10-CM

## 2019-12-03 DIAGNOSIS — Z794 Long term (current) use of insulin: Secondary | ICD-10-CM

## 2019-12-03 DIAGNOSIS — F325 Major depressive disorder, single episode, in full remission: Secondary | ICD-10-CM | POA: Diagnosis not present

## 2019-12-03 DIAGNOSIS — I10 Essential (primary) hypertension: Secondary | ICD-10-CM | POA: Diagnosis not present

## 2019-12-03 DIAGNOSIS — E119 Type 2 diabetes mellitus without complications: Secondary | ICD-10-CM | POA: Diagnosis not present

## 2019-12-03 DIAGNOSIS — K219 Gastro-esophageal reflux disease without esophagitis: Secondary | ICD-10-CM | POA: Diagnosis not present

## 2019-12-03 LAB — GLUCOSE, POCT (MANUAL RESULT ENTRY): POC Glucose: 250 mg/dl — AB (ref 70–99)

## 2019-12-03 MED ORDER — TRUEPLUS LANCETS 28G MISC: 3 refills | Status: DC

## 2019-12-03 MED ORDER — OMEPRAZOLE 20 MG PO CPDR: 40.0000 mg | DELAYED_RELEASE_CAPSULE | Freq: Every day | ORAL | 0 refills | Status: DC

## 2019-12-03 MED ORDER — JANUMET 50-1000 MG PO TABS: 1.0000 | ORAL_TABLET | Freq: Two times a day (BID) | ORAL | 3 refills | Status: DC

## 2019-12-03 MED ORDER — LISINOPRIL 20 MG PO TABS: 20.0000 mg | ORAL_TABLET | Freq: Every day | ORAL | 1 refills | Status: DC

## 2019-12-03 MED ORDER — ATORVASTATIN CALCIUM 40 MG PO TABS: 40.0000 mg | ORAL_TABLET | Freq: Every day | ORAL | 3 refills | Status: DC

## 2019-12-03 MED ORDER — LEVEMIR FLEXTOUCH 100 UNIT/ML ~~LOC~~ SOPN: 30.0000 [IU] | PEN_INJECTOR | Freq: Two times a day (BID) | SUBCUTANEOUS | 1 refills | Status: DC

## 2019-12-03 MED ORDER — TRUE METRIX BLOOD GLUCOSE TEST VI STRP: ORAL_STRIP | 12 refills | Status: DC

## 2019-12-03 MED ORDER — ACCU-CHEK SOFTCLIX LANCETS MISC: 6 refills | Status: DC

## 2019-12-03 MED ORDER — MICROLET LANCETS MISC
2 refills | Status: DC
Start: 2019-12-03 — End: 2019-12-03

## 2019-12-03 MED ORDER — NORTRIPTYLINE HCL 50 MG PO CAPS: 50.0000 mg | ORAL_CAPSULE | Freq: Every day | ORAL | 2 refills | Status: DC

## 2019-12-03 MED ORDER — TRUEPLUS PEN NEEDLES 31G X 5 MM MISC: 3 refills | Status: DC

## 2019-12-03 MED ORDER — ACCU-CHEK GUIDE ME W/DEVICE KIT: PACK | 0 refills | Status: DC

## 2019-12-03 MED FILL — LISINOPRIL 20 MG TABLET: 20 | 90 days supply | Qty: 90 | Fill #0

## 2019-12-03 MED FILL — ACCU-CHEK AVIVA PLUS STRP: 25 days supply | Qty: 100 | Fill #0

## 2019-12-03 MED FILL — NORTRIPTYLINE HCL 50 MG CAP: 50 | 30 days supply | Qty: 30 | Fill #0

## 2019-12-03 MED FILL — LEVEMIR FLEXTOUCH 100 UNITS: 100 | 30 days supply | Qty: 18 | Fill #0

## 2019-12-03 MED FILL — ATORVASTATIN CALCIUM 40 MG: 40 | 90 days supply | Qty: 90 | Fill #0

## 2019-12-03 MED FILL — TRUEPLUS 5-BEVEL PEN NEEDLE: 31G X 5 MM | 90 days supply | Qty: 100 | Fill #0

## 2019-12-03 MED FILL — OMEPRAZOLE 20 MG CAP: 20 | 30 days supply | Qty: 60 | Fill #0

## 2019-12-03 MED FILL — ACCU-CHEK SOFTCLIX LANCETS: 50 days supply | Qty: 100 | Fill #0

## 2019-12-03 NOTE — Progress Notes (Signed)
Assessment & Plan:  Robin Montgomery was seen today for follow-up.  Diagnoses and all orders for this visit:  Type 2 diabetes mellitus with hyperglycemia, with long-term current use of insulin (HCC) -     Glucose (CBG) -     sitaGLIPtin-metformin (JANUMET) 50-1000 MG tablet; Take 1 tablet by mouth 2 (two) times daily with a meal. -     insulin detemir (LEVEMIR FLEXTOUCH) 100 UNIT/ML FlexPen; Inject 30 Units into the skin 2 (two) times daily. -     glucose blood (TRUE METRIX BLOOD GLUCOSE TEST) test strip; Use as instructed -     TRUEplus Lancets 28G MISC; Use as instructed. Monitor blood glucose level by fingerstick twice a day -     Insulin Pen Needle (TRUEPLUS PEN NEEDLES) 31G X 5 MM MISC; USE AS INSTRUCTED. INJECT INTO THE SKIN ONCE NIGHTLY. E11.65 Continue blood sugar control as discussed in office today, low carbohydrate diet, and regular physical exercise as tolerated, 150 minutes per week (30 min each day, 5 days per week, or 50 min 3 days per week). Keep blood sugar logs with fasting goal of 90-130 mg/dl, post prandial (after you eat) less than 180.  For Hypoglycemia: BS <60 and Hyperglycemia BS >400; contact the clinic ASAP. Annual eye exams and foot exams are recommended.  Essential hypertension -     lisinopril (ZESTRIL) 20 MG tablet; Take 1 tablet (20 mg total) by mouth daily. Continue all antihypertensives as prescribed.  Remember to bring in your blood pressure log with you for your follow up appointment.  DASH/Mediterranean Diets are healthier choices for HTN.    GERD without esophagitis -     omeprazole (PRILOSEC) 20 MG capsule; Take 2 capsules (40 mg total) by mouth daily. INSTRUCTIONS: Avoid GERD Triggers: acidic, spicy or fried foods, caffeine, coffee, sodas,  alcohol and chocolate.   Major depressive disorder with single episode, in partial remission (HCC) -     nortriptyline (PAMELOR) 50 MG capsule; Take 1 capsule (50 mg total) by mouth at bedtime. She is not adherent with  taking her antidepressant.  Depression screen [facility] 2/9 12/03/2019 08/28/2019 06/09/2017 07/28/2016 11/17/2015  Decreased Interest 2 0 _0 Down, Depressed, Hopeless 0 _1 PHQ - 2 Score _2 Altered sleeping _3 Tired, decreased energy 2 0 _4 Change in appetite 2 0 _5 Feeling bad or failure about yourself  _6 Trouble concentrating 1 0 _7 Moving slowly or fidgety/restless 0 0 _8 Suicidal thoughts 0 0 0 2 2  PHQ-9 Score _9 Mixed hyperlipidemia -     atorvastatin (LIPITOR) 40 MG tablet; Take 1 tablet (40 mg total) by mouth daily. INSTRUCTIONS: Work on a low fat, heart healthy diet and participate in regular aerobic exercise program by working out at least 150 minutes per week; 5 days a week-30 minutes per day. Avoid red meat/beef/steak,  fried foods. junk foods, sodas, sugary drinks, unhealthy snacking, alcohol and smoking.  Drink at least 80 oz of water per day and monitor your carbohydrate intake daily.     Patient has been counseled on age-appropriate routine health concerns for screening and prevention. These are reviewed and up-to-date. Referrals have been placed accordingly. Immunizations are up-to-date or declined.    Subjective:   Chief Complaint  Patient presents with  .  Follow-up    Pt. is here for diabetes and blood pressure follow up.    HPI Robin Montgomery 59 y.o. female presents to office today for follow up.  has a past medical history of Depression, Diabetes mellitus without complication ([place]), and Hypertension.  Patient has been counseled on age-appropriate routine health concerns for screening and prevention. These are reviewed and up-to-date. Referrals have been placed accordingly. Immunizations are up-to-date or declined.    Still awaiting mammogram which has been ordered.    DM TYPE 2 Poorly controlled. LDL not at goal. She in increased her levemir to 30 units. She is not currently taking her janumet 50-1000 mg  BID. Not monitoring her blood glucose levels. States she lost her meter.  Lab Results  Component Value Date   HGBA1C 10.5 (H) 11/18/2019   Lab Results  Component Value Date   LDLCALC 154 (H) 11/18/2019   Essential Hypertension Not well controlled. WIll increase lisinopril from 10 mg daily to 20 mg. She has a history of non adherence. Denies chest pain, shortness of breath, palpitations, lightheadedness, dizziness, headaches or BLE edema.  BP Readings from Last 3 Encounters:  12/03/19 (!) 156/82  08/19/19 (!) 193/102  12/30/17 136/74   Review of Systems  Constitutional: Negative for fever, malaise/fatigue and weight loss.  HENT: Negative.  Negative for nosebleeds.   Eyes: Negative.  Negative for blurred vision, double vision and photophobia.  Respiratory: Negative.  Negative for cough and shortness of breath.   Cardiovascular: Negative.  Negative for chest pain, palpitations and leg swelling.  Gastrointestinal: Positive for heartburn. Negative for nausea and vomiting.  Musculoskeletal: Negative.  Negative for myalgias.  Neurological: Negative.  Negative for dizziness, focal weakness, seizures and headaches.  Psychiatric/Behavioral: Positive for depression. Negative for suicidal ideas.    Past Medical History:  Diagnosis Date  . Depression   . Diabetes mellitus without complication ([place])   . Hypertension     Past Surgical History:  Procedure Laterality Date  . CHOLECYSTECTOMY      History reviewed. No pertinent family history.  Social History Reviewed with no changes to be made today.   Outpatient Medications Prior to Visit  Medication Sig Dispense Refill  . albuterol (VENTOLIN HFA) 108 (90 Base) MCG/ACT inhaler INHALE 1 PUFF INTO THE LUNGS EVERY 6 HOURS AS NEEDED FOR WHEEZING OR SHORTNESS OF BREATH. 18 g 1  . Blood Glucose Monitoring Suppl (TRUE METRIX METER) w/Device KIT Use to check blood sugar daily. 1 kit 0  . amoxicillin (AMOXIL) 500 MG capsule Take 1 capsule (500  mg total) by mouth 3 (three) times daily. 21 capsule 0  . glucose blood (TRUE METRIX BLOOD GLUCOSE TEST) test strip Use as instructed 100 each 12  . LEVEMIR FLEXTOUCH 100 UNIT/ML FlexPen INJECT 20 UNITS INTO THE SKIN 2 (TWO) TIMES DAILY FOR 30 DAYS. (Patient taking differently: 30 Units 2 (two) times daily. ) 12 mL 0  . lisinopril (ZESTRIL) 10 MG tablet TAKE 1 TABLET (10 MG TOTAL) BY MOUTH DAILY. 30 tablet 0  . TRUEplus Lancets 28G MISC Use as instructed. Monitor blood glucose level by fingerstick twice a day 100 each 3  . TRUEPLUS PEN NEEDLES 31G X 5 MM MISC USE AS INSTRUCTED. INJECT INTO THE SKIN ONCE NIGHTLY. 100 each 1  . acetaminophen (TYLENOL) 500 MG tablet Take 1 tablet (500 mg total) by mouth every 6 (six) hours as needed. (Patient not taking: Reported on 08/28/2019) 30 tablet 0  . atorvastatin (LIPITOR) 40 MG tablet Take  1 tablet (40 mg total) by mouth daily. (Patient not taking: Reported on 12/03/2019) 90 tablet 3  . nortriptyline (PAMELOR) 50 MG capsule Take 1 capsule (50 mg total) by mouth at bedtime. (Patient not taking: Reported on 12/03/2019) 30 capsule 2  . omeprazole (PRILOSEC) 20 MG capsule Take 2 capsules (40 mg total) by mouth daily. (Patient not taking: Reported on 12/03/2019) 60 capsule 0  . sitaGLIPtin-metformin (JANUMET) 50-1000 MG tablet Take 1 tablet by mouth 2 (two) times daily with a meal. 60 tablet 3   No facility-administered medications prior to visit.    Allergies  Allergen Reactions  . Aspirin Nausea And Vomiting    Upset stomach       Objective:    BP (!) 156/82 (BP Location: Left Arm, Patient Position: Sitting, Cuff Size: Normal)   Pulse 90   Temp 97.7 F (36.5 C) (Temporal)   Ht 5' 3.39" (1.61 m)   Wt 147 lb (66.7 kg)   SpO2 97%   BMI 25.72 kg/m  Wt Readings from Last 3 Encounters:  12/03/19 147 lb (66.7 kg)  08/19/19 154 lb (69.9 kg)  12/30/17 165 lb (74.8 kg)    Physical Exam Vitals and nursing note reviewed.  Constitutional:       Appearance: She is well-developed.  HENT:     Head: Normocephalic and atraumatic.  Cardiovascular:     Rate and Rhythm: Normal rate and regular rhythm.     Heart sounds: Normal heart sounds. No murmur heard.  No friction rub. No gallop.   Pulmonary:     Effort: Pulmonary effort is normal. No tachypnea or respiratory distress.     Breath sounds: Normal breath sounds. No decreased breath sounds, wheezing, rhonchi or rales.  Chest:     Chest wall: No tenderness.  Abdominal:     General: Bowel sounds are normal.     Palpations: Abdomen is soft.  Musculoskeletal:        General: Normal range of motion.     Cervical back: Normal range of motion.  Skin:    General: Skin is warm and dry.  Neurological:     Mental Status: She is alert and oriented to person, place, and time.     Coordination: Coordination normal.  Psychiatric:        Behavior: Behavior normal. Behavior is cooperative.        Thought Content: Thought content normal.        Judgment: Judgment normal.          Patient has been counseled extensively about nutrition and exercise as well as the importance of adherence with medications and regular follow-up. The patient was given clear instructions to go to ER or return to medical center if symptoms don't improve, worsen or new problems develop. The patient verbalized understanding.   Follow-up: Return in about 4 weeks (around 12/31/2019) for BP recheck, PAP SMEAR.   Gildardo Pounds, FNP-BC [facility] [place], [place]   12/03/2019, 2:19 PM

## 2019-12-12 ENCOUNTER — Encounter (HOSPITAL_COMMUNITY): Payer: Self-pay

## 2019-12-12 ENCOUNTER — Other Ambulatory Visit: Payer: Self-pay

## 2019-12-12 ENCOUNTER — Emergency Department (HOSPITAL_COMMUNITY)
Admission: EM | Admit: 2019-12-12 | Discharge: 2019-12-13 | Disposition: A | Discharge status: Other Acute Inpatient Hospital | Payer: Medicaid Other | Attending: Emergency Medicine | Admitting: Emergency Medicine

## 2019-12-12 DIAGNOSIS — E119 Type 2 diabetes mellitus without complications: Secondary | ICD-10-CM | POA: Diagnosis not present

## 2019-12-12 DIAGNOSIS — F17210 Nicotine dependence, cigarettes, uncomplicated: Secondary | ICD-10-CM | POA: Insufficient documentation

## 2019-12-12 DIAGNOSIS — Z794 Long term (current) use of insulin: Secondary | ICD-10-CM | POA: Insufficient documentation

## 2019-12-12 DIAGNOSIS — F333 Major depressive disorder, recurrent, severe with psychotic symptoms: Secondary | ICD-10-CM | POA: Insufficient documentation

## 2019-12-12 DIAGNOSIS — Z03818 Encounter for observation for suspected exposure to other biological agents ruled out: Secondary | ICD-10-CM | POA: Diagnosis not present

## 2019-12-12 DIAGNOSIS — R45851 Suicidal ideations: Secondary | ICD-10-CM | POA: Diagnosis not present

## 2019-12-12 DIAGNOSIS — Z20822 Contact with and (suspected) exposure to covid-19: Secondary | ICD-10-CM | POA: Insufficient documentation

## 2019-12-12 DIAGNOSIS — F332 Major depressive disorder, recurrent severe without psychotic features: Secondary | ICD-10-CM

## 2019-12-12 DIAGNOSIS — I10 Essential (primary) hypertension: Secondary | ICD-10-CM | POA: Insufficient documentation

## 2019-12-12 LAB — COMPREHENSIVE METABOLIC PANEL
ALT: 20 U/L (ref 0–44)
AST: 19 U/L (ref 15–41)
Albumin: 4.3 g/dL (ref 3.5–5.0)
Alkaline Phosphatase: 90 U/L (ref 38–126)
Anion gap: 17 — ABNORMAL HIGH (ref 5–15)
BUN: 7 mg/dL (ref 6–20)
CO2: 23 mmol/L (ref 22–32)
Calcium: 9.1 mg/dL (ref 8.9–10.3)
Chloride: 101 mmol/L (ref 98–111)
Creatinine, Ser: 0.51 mg/dL (ref 0.44–1.00)
GFR calc Af Amer: >60 mL/min (ref >60)
GFR calc non Af Amer: >60 mL/min (ref >60)
Glucose, Bld: 214 mg/dL — ABNORMAL HIGH (ref 70–99)
Potassium: 3.4 mmol/L — ABNORMAL LOW (ref 3.5–5.1)
Sodium: 141 mmol/L (ref 135–145)
Total Bilirubin: 0.6 mg/dL (ref 0.3–1.2)
Total Protein: 7.7 g/dL (ref 6.5–8.1)

## 2019-12-12 LAB — CBG MONITORING, ED
Glucose-Capillary: 210 mg/dL — ABNORMAL HIGH (ref 70–99)
Glucose-Capillary: 241 mg/dL — ABNORMAL HIGH (ref 70–99)

## 2019-12-12 LAB — CBC
HCT: 39.8 % (ref 36.0–46.0)
Hemoglobin: 13.4 g/dL (ref 12.0–15.0)
MCH: 31.8 pg (ref 26.0–34.0)
MCHC: 33.7 g/dL (ref 30.0–36.0)
MCV: 94.5 fL (ref 80.0–100.0)
Platelets: 421 10*3/uL — ABNORMAL HIGH (ref 150–400)
RBC: 4.21 MIL/uL (ref 3.87–5.11)
RDW: 13.1 % (ref 11.5–15.5)
WBC: 8.1 10*3/uL (ref 4.0–10.5)
nRBC: 0 % (ref 0.0–0.2)

## 2019-12-12 LAB — SALICYLATE LEVEL: Salicylate Lvl: <7 mg/dL — ABNORMAL LOW (ref 7.0–30.0)

## 2019-12-12 LAB — ETHANOL: Alcohol, Ethyl (B): 127 mg/dL — ABNORMAL HIGH (ref <10)

## 2019-12-12 LAB — ACETAMINOPHEN LEVEL: Acetaminophen (Tylenol), Serum: <10 ug/mL — ABNORMAL LOW (ref 10–30)

## 2019-12-12 MED ORDER — NORTRIPTYLINE HCL 25 MG PO CAPS
50.0000 mg | ORAL_CAPSULE | Freq: Every day | ORAL | Status: DC
  Administered 2019-12-12: 50 mg via ORAL
  Filled 2019-12-12 (×3): qty 2

## 2019-12-12 MED ORDER — SITAGLIPTIN PHOS-METFORMIN HCL 50-1000 MG PO TABS: 1.0000 | ORAL_TABLET | Freq: Two times a day (BID) | ORAL | Status: DC

## 2019-12-12 MED ORDER — INSULIN DETEMIR 100 UNIT/ML ~~LOC~~ SOLN
30.0000 [IU] | Freq: Two times a day (BID) | SUBCUTANEOUS | Status: DC
  Administered 2019-12-12 – 2019-12-13 (×2): 30 [IU] via SUBCUTANEOUS
  Filled 2019-12-12 (×4): qty 0.3

## 2019-12-12 MED ORDER — PANTOPRAZOLE SODIUM 40 MG PO TBEC
40.0000 mg | DELAYED_RELEASE_TABLET | Freq: Every day | ORAL | Status: DC
  Administered 2019-12-12 – 2019-12-13 (×2): 40 mg via ORAL
  Filled 2019-12-12 (×2): qty 1

## 2019-12-12 MED ORDER — ALBUTEROL SULFATE HFA 108 (90 BASE) MCG/ACT IN AERS
1.0000 | INHALATION_SPRAY | Freq: Four times a day (QID) | RESPIRATORY_TRACT | Status: DC | PRN
  Administered 2019-12-13: 1 via RESPIRATORY_TRACT
  Filled 2019-12-12: qty 6.7

## 2019-12-12 MED ORDER — LINAGLIPTIN 5 MG PO TABS
5.0000 mg | ORAL_TABLET | Freq: Two times a day (BID) | ORAL | Status: DC
  Administered 2019-12-13: 5 mg via ORAL
  Filled 2019-12-12 (×3): qty 1

## 2019-12-12 MED ORDER — ATORVASTATIN CALCIUM 40 MG PO TABS
40.0000 mg | ORAL_TABLET | Freq: Every day | ORAL | Status: DC
  Administered 2019-12-12 – 2019-12-13 (×2): 40 mg via ORAL
  Filled 2019-12-12 (×2): qty 1

## 2019-12-12 MED ORDER — METFORMIN HCL 500 MG PO TABS
1000.0000 mg | ORAL_TABLET | Freq: Two times a day (BID) | ORAL | Status: DC
  Filled 2019-12-12 (×2): qty 2

## 2019-12-12 MED ORDER — ACETAMINOPHEN 500 MG PO TABS: 500.0000 mg | ORAL_TABLET | Freq: Four times a day (QID) | ORAL | Status: DC | PRN

## 2019-12-12 MED ORDER — LISINOPRIL 20 MG PO TABS
20.0000 mg | ORAL_TABLET | Freq: Every day | ORAL | Status: DC
  Administered 2019-12-12 – 2019-12-13 (×2): 20 mg via ORAL
  Filled 2019-12-12 (×2): qty 1

## 2019-12-12 NOTE — BH Assessment (Signed)
Clinician attempted to make contact w/ Triage 3x between 2214 & 2227 in an attempt to complete pt's BH Assessment but there was no answer. TTS will attempt to make contact w/ Triage in an attempt to complete the assessment at a later time.

## 2019-12-12 NOTE — ED Notes (Signed)
Patient refusing COVID test at this time.

## 2019-12-12 NOTE — ED Provider Notes (Signed)
[facility] DEPT Provider Note   CSN: [id number] Arrival date & time: 12/12/19  1629     History Chief Complaint  Patient presents with  . Suicidal    Robin Montgomery is a 59 y.o. female with past medical history notable for cocaine use disorder, MDD, hallucinations, and suicidal ideation who presents to the ED with complaints of severe depression and suicidal ideation.  She is also complaining of auditory hallucinations that have gotten louder and are "angry".  She reports that her symptoms all began 1 week ago today when she was at her cousin's funeral.  She states that later that day she tried to kill herself by smoking $500 worth of crack.  She also has been drinking a significant amount of alcohol each night.  She is very scared for her own wellbeing and is unsure as to what will happen if she gets discharged.  She denies any symptoms of illness.  She reports that she did not take her insulin or antihypertensive medication this morning because she simply does not care.  She denies any chest pain, shortness of breath, back pain, blurred vision, or other focal neurologic deficits.  She states that she has been living on the streets for the past week which is exacerbating her depression.  HPI     Past Medical History:  Diagnosis Date  . Depression   . Diabetes mellitus without complication ([place])   . Hypertension     Patient Active Problem List   Diagnosis Date Noted  . Left medial knee pain 07/31/2016  . Essential hypertension 11/17/2015  . Alcohol abuse   . MDD (major depressive disorder), recurrent severe, without psychosis ([place]) 02/12/2015  . Substance induced mood disorder ([place]) 02/12/2015  . Polysubstance abuse ([place])   . Wrist pain, chronic 12/22/2014  . Trochanteric bursitis of left hip 12/22/2014  . Seasonal allergies 09/12/2014  . Tobacco dependence 09/12/2014  . Shortness of breath 09/12/2014  . Diabetes mellitus type 2, insulin  dependent ([place]) 04/01/2014  . Pain due to onychomycosis of toenail 04/01/2014  . Cocaine abuse ([place]) 04/01/2014    Past Surgical History:  Procedure Laterality Date  . CHOLECYSTECTOMY       OB History   No obstetric history on file.     History reviewed. No pertinent family history.  Social History   Tobacco Use  . Smoking status: Current Every Day Smoker    Packs/day: 0.25    Types: Cigarettes  . Smokeless tobacco: Former Systems developer    Types: Snuff  Vaping Use  . Vaping Use: Never used  Substance Use Topics  . Alcohol use: Yes    Alcohol/week: 0.0 standard drinks    Comment: every other week   . Drug use: Yes    Types: Cocaine, Marijuana    Home Medications Prior to Admission medications   Medication Sig Start Date End Date Taking? Authorizing Provider  acetaminophen (TYLENOL) 500 MG tablet Take 1 tablet (500 mg total) by mouth every 6 (six) hours as needed. Patient taking differently: Take 500 mg by mouth every 6 (six) hours as needed for moderate pain.  08/19/19  Yes Corena Herter, PA-C  albuterol (VENTOLIN HFA) 108 (90 Base) MCG/ACT inhaler INHALE 1 PUFF INTO THE LUNGS EVERY 6 HOURS AS NEEDED FOR WHEEZING OR SHORTNESS OF BREATH. 08/28/19  Yes Gildardo Pounds, NP  atorvastatin (LIPITOR) 40 MG tablet Take 1 tablet (40 mg total) by mouth daily. 12/03/19  Yes Gildardo Pounds, NP  insulin detemir (LEVEMIR FLEXTOUCH) 100 UNIT/ML FlexPen Inject 30 Units into the skin 2 (two) times daily. 12/03/19 03/02/20 Yes Gildardo Pounds, NP  lisinopril (ZESTRIL) 20 MG tablet Take 1 tablet (20 mg total) by mouth daily. 12/03/19 03/02/20 Yes Gildardo Pounds, NP  nortriptyline (PAMELOR) 50 MG capsule Take 1 capsule (50 mg total) by mouth at bedtime. 12/03/19  Yes Gildardo Pounds, NP  omeprazole (PRILOSEC) 20 MG capsule Take 2 capsules (40 mg total) by mouth daily. 12/03/19  Yes Gildardo Pounds, NP  sitaGLIPtin-metformin (JANUMET) 50-1000 MG tablet Take 1 tablet by mouth 2 (two) times daily with  a meal. 12/03/19 01/02/20 Yes Gildardo Pounds, NP  Accu-Chek Softclix Lancets lancets Use as instructed to check blood sugar twice daily. 12/03/19   Charlott Rakes, MD  Blood Glucose Monitoring Suppl (ACCU-CHEK GUIDE ME) w/Device KIT Use to check blood sugar twice daily. 12/03/19   Charlott Rakes, MD  glucose blood (TRUE METRIX BLOOD GLUCOSE TEST) test strip Use as instructed 12/03/19   Gildardo Pounds, NP  Insulin Pen Needle (TRUEPLUS PEN NEEDLES) 31G X 5 MM MISC USE AS INSTRUCTED. INJECT INTO THE SKIN ONCE NIGHTLY. E11.65 12/03/19   Gildardo Pounds, NP    Allergies    Aspirin  Review of Systems   Review of Systems  All other systems reviewed and are negative.   Physical Exam Updated Vital Signs BP (!) 157/100   Pulse (!) 116   Temp 99.3 F (37.4 C) (Oral)   Resp 18   Ht '5\' 2"'  (1.575 m)   Wt 66.1 kg   SpO2 97%   BMI 26.67 kg/m   Physical Exam Vitals and nursing note reviewed. Exam conducted with a chaperone present.  Constitutional:      General: She is not in acute distress.    Appearance: She is not ill-appearing.     Comments: Teary-eyed.  HENT:     Head: Normocephalic and atraumatic.  Eyes:     General: No scleral icterus.    Conjunctiva/sclera: Conjunctivae normal.  Cardiovascular:     Rate and Rhythm: Normal rate and regular rhythm.     Pulses: Normal pulses.     Heart sounds: Normal heart sounds.  Pulmonary:     Effort: Pulmonary effort is normal. No respiratory distress.     Breath sounds: Normal breath sounds.  Abdominal:     General: Abdomen is flat. There is no distension.     Palpations: Abdomen is soft.     Tenderness: There is no abdominal tenderness.  Musculoskeletal:     Cervical back: Normal range of motion.  Skin:    General: Skin is dry.     Capillary Refill: Capillary refill takes less than 2 seconds.  Neurological:     Mental Status: She is alert and oriented to person, place, and time.     GCS: GCS eye subscore is 4. GCS verbal subscore  is 5. GCS motor subscore is 6.  Psychiatric:     Comments: Teary-eyed.  Morose.       ED Results / Procedures / Treatments   Labs (all labs ordered are listed, but only abnormal results are displayed) Labs Reviewed  COMPREHENSIVE METABOLIC PANEL - Abnormal; Notable for the following components:      Result Value   Potassium 3.4 (*)    Glucose, Bld 214 (*)    Anion gap 17 (*)    All other components within normal limits  ETHANOL - Abnormal; Notable for the  following components:   Alcohol, Ethyl (B) 127 (*)    All other components within normal limits  SALICYLATE LEVEL - Abnormal; Notable for the following components:   Salicylate Lvl <6.1 (*)    All other components within normal limits  ACETAMINOPHEN LEVEL - Abnormal; Notable for the following components:   Acetaminophen (Tylenol), Serum <10 (*)    All other components within normal limits  CBC - Abnormal; Notable for the following components:   Platelets 421 (*)    All other components within normal limits  CBG MONITORING, ED - Abnormal; Notable for the following components:   Glucose-Capillary 210 (*)    All other components within normal limits  SARS CORONAVIRUS 2 BY RT PCR (HOSPITAL ORDER, [place] [facility])  RAPID URINE DRUG SCREEN, HOSP PERFORMED    EKG None  Radiology No results found.  Procedures Procedures (including critical care time)  Medications Ordered in ED Medications  acetaminophen (TYLENOL) tablet 500 mg (has no administration in time range)  albuterol (VENTOLIN HFA) 108 (90 Base) MCG/ACT inhaler 1 puff (has no administration in time range)  atorvastatin (LIPITOR) tablet 40 mg (has no administration in time range)  insulin detemir (LEVEMIR) injection 30 Units (has no administration in time range)  lisinopril (ZESTRIL) tablet 20 mg (has no administration in time range)  nortriptyline (PAMELOR) capsule 50 mg (has no administration in time range)  pantoprazole (PROTONIX) EC tablet  40 mg (has no administration in time range)  linagliptin (TRADJENTA) tablet 5 mg (has no administration in time range)    And  metFORMIN (GLUCOPHAGE) tablet 1,000 mg (has no administration in time range)    ED Course  I have reviewed the triage vital signs and the nursing notes.  Pertinent labs & imaging results that were available during my care of the patient were reviewed by me and considered in my medical decision making (see chart for details).    MDM Rules/Calculators/A&P                          Patient will need to be evaluated by TTS for her suicidal ideation, MDD, and auditory hallucinations.  She drinks copious amount of alcohol and endorses regular polysubstance abuse.  She recently has lost a family member and has become homeless.  She is relatively hopeless on my examination and did not take her medications today because she simply "does not care".  Patient's laboratory work-up is reviewed and largely unremarkable.  While she was tachycardic here in the ED, she is anxious and tearful on exam.  She denies any fevers or symptoms of illness.  Her physical exam is entirely benign.  Her UDS still needs to be collected, but otherwise patient is medically cleared.  Alcohol is elevated to 127, but patient appears to be sober enough to speak with psychiatry.  We will consult TTS to evaluate patient and determine disposition.  Patient is voluntary.     Final Clinical Impression(s) / ED Diagnoses Final diagnoses:  Suicidal ideation  Severe episode of recurrent major depressive disorder, with psychotic features [facility])    Rx / DC Orders ED Discharge Orders    None       Reita Chard 12/12/19 2132    Lacretia Leigh, MD 12/13/19 1529

## 2019-12-12 NOTE — ED Triage Notes (Signed)
Patient states she is suicidal. Patient states "I tried to smoke my self to death. I smoked $500 worth of crack." Patient states she hears voices that tell her to hurt herself and sees shadows periodically. Patient denies HI.

## 2019-12-13 LAB — SARS CORONAVIRUS 2 BY RT PCR (HOSPITAL ORDER, PERFORMED IN ~~LOC~~ HOSPITAL LAB): SARS Coronavirus 2: NEGATIVE

## 2019-12-13 LAB — RAPID URINE DRUG SCREEN, HOSP PERFORMED
Amphetamines: NOT DETECTED
Barbiturates: NOT DETECTED
Benzodiazepines: NOT DETECTED
Cocaine: POSITIVE — AB
Opiates: NOT DETECTED
Tetrahydrocannabinol: POSITIVE — AB

## 2019-12-13 LAB — CBG MONITORING, ED: Glucose-Capillary: 139 mg/dL — ABNORMAL HIGH (ref 70–99)

## 2019-12-13 MED ORDER — NICOTINE 7 MG/24HR TD PT24
7.0000 mg | MEDICATED_PATCH | Freq: Once | TRANSDERMAL | Status: DC
  Administered 2019-12-13: 7 mg via TRANSDERMAL
  Filled 2019-12-13: qty 1

## 2019-12-13 NOTE — ED Notes (Signed)
Attempted to call report to receiving nurse at [facility] for pt transfer to facility. No answer at this time.

## 2019-12-13 NOTE — ED Notes (Signed)
Pt's belongings labeled and in belonging cabinet. Safety sitter at bedside.

## 2019-12-13 NOTE — BH Assessment (Signed)
Comprehensive Clinical Assessment (CCA) Note  12/13/2019 Robin Robin Montgomery [id number]  Visit Diagnosis:      ICD-10-CM   1. Suicidal ideation  R45.851   2. Severe episode of recurrent major depressive disorder, with psychotic features (HCC)  F33.3       CCA Screening, Triage and Referral (STR) Robin BowensCynthia Montgomery is a 59 year old patient who [facility] at her request. Pt shares she has been abusing EtOH after not drinking for several years, used $500 of crack on Thursday, December 05, 2019 in an attempt to o/d and kill herself (after abstaining from use for several years), and that she has been experiencing SI and AVH. She shares this has all occurred since the sudden death of her cousin, which was unexpected and occurred within a 48-hour period after he broke his hip. She states, "I wanted to harm myself for a couple of days now. I've been hearing voices in my head lately; I used to but I hadn't in a few months. I wasn't on any medication. [I'm also] depressed; I just lost a family member."  Pt acknowledges she's experienced SI in the past, though states it's been a while. She shares she's been hospitalized for mental health concerns in the past, though she does not currently have a therapist or a psychiatrist. She denies HI, NSSIB, and access to guns/weapons, engagement with the legal system.  Pt's protective factors include her relationship with her family and her desire to get Robin Montgomery.  Pt declined to provide clinician verbal consent to contact friends/family members for collateral information.  Pt is oriented x5. Her recent and remote memory is intact. Pt was cooperative and pleasant throughout the assessment. Pt's insight, judgement, and impulse control is fair - poor.   Patient Reported Information How did you hear about us? Self  Referral name: No data recorded Referral phone number: No data recorded  Whom do you see for routine medical problems? Primary  Care  [facility]  Practice/Facility Phone Number: No data recorded Name of Contact: No data recorded Contact Number: No data recorded Contact Fax Number: No data recorded Prescriber Name: No data recorded Prescriber Address (if known): No data recorded  What Is the Reason for Your Visit/Call Today? No data recorded How Long Has This Been Causing You Problems? 1-6 months  What Do You Feel Would Help You the Most Today? Other (Comment) (Hospitalization)   Have You Recently Been in Any Inpatient Treatment (Hospital/Detox/Crisis Center/28-Day Program)? No  Name/Location of Program/Hospital:No data recorded How Long Were You There? No data recorded When Were You Discharged? No data recorded  Have You Ever Received Services From [facility] Before? Yes  Who [facility] You [facility]? Unknown   Have You Recently Had Any Thoughts About Hurting Yourself? Yes  Are You Planning to Commit Suicide/Harm Yourself At This time? Yes   Have you Recently Had Thoughts About Hurting Someone Robin Ohslse? Yes  Explanation: No data recorded  Have You Used Any Alcohol or Drugs in the Past 24 Hours? Yes  How Long Ago Did You Use Drugs or Alcohol? 1700  What Did You Use and How Much? Alcohol and on Thursday, July 1st, did $500 of crack   Do You Currently Have a Therapist/Psychiatrist? No  Name of Therapist/Psychiatrist: No data recorded  Have You Been Recently Discharged From Any Office Practice or Programs? No  Explanation of Discharge From Practice/Program: No data recorded    CCA Screening  Triage Referral Assessment Type of Contact: Tele-Assessment  Is this Initial or Reassessment? Initial Assessment  Date Telepsych consult ordered in [facility]:  12/12/19  Time Telepsych consult ordered in [facility]:  1936   Patient Reported Information Reviewed? Yes  Patient Left Without Being Seen? No data recorded Reason for Not Completing Assessment: No data  recorded  Collateral Involvement: Pt declined to provide verbal consent for clinician to contact her friends/family for collateral information   Does Patient Have a Court Appointed Legal Guardian? No data recorded Name and Contact of Legal Guardian: No data recorded If Minor and Not Living with Parent(s), Who has Custody? N/A  Is CPS involved or ever been involved? Never  Is APS involved or ever been involved? Never   Patient Determined To Be At Risk for Harm To Self or Others Based on Review of Patient Reported Information or Presenting Complaint? Yes, for Self-Harm  Method: No data recorded Availability of Means: No data recorded Intent: No data recorded Notification Required: No data recorded Additional Information for Danger to Others Potential: No data recorded Additional Comments for Danger to Others Potential: No data recorded Are There Guns or Other Weapons in Your Home? No data recorded Types of Guns/Weapons: No data recorded Are These Weapons Safely Secured?                            No data recorded Who Could Verify You Are Able To Have These Secured: No data recorded Do You Have any Outstanding Charges, Pending Court Dates, Parole/Probation? No data recorded Contacted To Inform of Risk of Harm To Self or Others: No data recorded  Location of Assessment: [facility] ED   Does Patient Present under Involuntary Commitment? No  IVC Papers Initial File Date: No data recorded  [place] of Residence: [place]   Patient Currently Receiving the Following Services: Not Receiving Services   Determination of Need: Emergent (2 hours)   Options For Referral: Inpatient Hospitalization     CCA Biopsychosocial  Intake/Chief Complaint:  CCA Intake With Chief Complaint CCA Part Two Date: 12/13/19 CCA Part Two Time: 0140 Chief Complaint/Presenting Problem: Pt shares she has been experiencing AVH, severe depression, SI, and abusing alcohol and crack Patient's Currently Reported  Symptoms/Problems: MDD and SI Individual's Strengths: "Good at caring for elderly people" Individual's Preferences: Pt would like to receive inpatient hospitalization services Individual's Abilities: Pt is good at communicating her needs Type of Services Patient Feels Are Needed: Pt would like to receive inpatient hospitalization services Initial Clinical Notes/Concerns: Concerns for pt's safety are prevalent  Mental Health Symptoms Depression:  Depression: Hopelessness, Tearfulness, Irritability, Fatigue  Mania:  Mania: None  Anxiety:   Anxiety: Difficulty concentrating, Worrying, Tension  Psychosis:  Psychosis: Hallucinations  Trauma:  Trauma: Guilt/shame, Re-experience of traumatic event  Obsessions:  Obsessions: None  Compulsions:  Compulsions: None  Inattention:  Inattention: None  Hyperactivity/Impulsivity:  Hyperactivity/Impulsivity: N/A  Oppositional/Defiant Behaviors:  Oppositional/Defiant Behaviors: None  Emotional Irregularity:  Emotional Irregularity: Potentially harmful impulsivity, Chronic feelings of emptiness  Other Mood/Personality Symptoms:      Mental Status Exam Appearance and self-care  Stature:  Stature: Average  Weight:  Weight: Average weight  Clothing:     Grooming:  Grooming: Normal  Cosmetic use:  Cosmetic Use: None  Posture/gait:  Posture/Gait: Normal  Motor activity:  Motor Activity: Not Remarkable  Sensorium  Attention:  Attention: Normal  Concentration:  Concentration: Normal  Orientation:  Orientation: X5  Recall/memory:  Recall/Memory:  Normal  Affect and Mood  Affect:  Affect: Depressed  Mood:  Mood: Worthless, Depressed  Relating  Eye contact:  Eye Contact: Normal  Facial expression:  Facial Expression: Depressed  Attitude toward examiner:  Attitude Toward Examiner: Cooperative  Thought and Language  Speech flow: Speech Flow: Clear and Coherent  Thought content:  Thought Content: Appropriate to Mood and Circumstances  Preoccupation:   Preoccupations: None  Hallucinations:  Hallucinations: Auditory, Visual  Organization:     Company secretary of Knowledge:  Fund of Knowledge: Average  Intelligence:  Intelligence: Average  Abstraction:     Judgement:  Judgement: Fair  Dance movement psychotherapist:     Insight:  Insight: Gaps  Decision Making:  Decision Making: Impulsive  Social Functioning  Social Maturity:     Social Judgement:     Stress  Stressors:  Stressors: Family conflict, Grief/losses  Coping Ability:  Coping Ability: Building surveyor Deficits:     Supports:        Religion: Religion/Spirituality Are You A Religious Person?: Yes What is Your Religious Affiliation?: Environmental consultant: Leisure / Recreation Do You Have Hobbies?: No  Exercise/Diet: Exercise/Diet Do You Exercise?: No Have You Gained or Lost A Significant Amount of Weight in the Past Six Months?: No Do You Follow a Special Diet?: No Do You Have Any Trouble Sleeping?: Yes Explanation of Sleeping Difficulties: Difficulties falling asleep and staying asleep   CCA Employment/Education  Employment/Work Situation:    Education:     CCA Family/Childhood History  Family and Relationship History:    Childhood History:     Child/Adolescent Assessment:     CCA Substance Use  Alcohol/Drug Use: Alcohol / Drug Use Pain Medications: Please see MAR Prescriptions: Please see MAR Over the Counter: Please see MAR History of alcohol / drug use?: Yes Longest period of sobriety (when/how long): Unknown Substance #1 Name of Substance 1: Crack 1 - Amount (size/oz): $500 1 - Frequency: 1x1 1 - Last Use / Amount: July 1 Substance #2 Name of Substance 2: EtOH 2 - Amount (size/oz): 2-3 pints/day 2 - Frequency: Daily since Saturday 2 - Last Use / Amount: Today                     ASAM's:  Six Dimensions of Multidimensional Assessment  Dimension 1:  Acute Intoxication and/or Withdrawal Potential:       Dimension 2:  Biomedical Conditions and Complications:      Dimension 3:  Emotional, Behavioral, or Cognitive Conditions and Complications:     Dimension 4:  Readiness to Change:     Dimension 5:  Relapse, Continued use, or Continued Problem Potential:     Dimension 6:  Recovery/Living Environment:     ASAM Severity Score:    ASAM Recommended Level of Treatment:     Substance use Disorder (SUD)    Recommendations for Services/Supports/Treatments: Nira Conn, NP, reviewed pt's chart and information and determined pt meets criteria for inpatient hospitalization. Pt is currently being reviewed by [facility]; if there are no appropriate beds for her at [facility], pt's referral information will be faxed out to multiple other hospitals. This information was provided to pt's nurse, Chari Manning RN, via internal messenger at [phone].    DSM5 Diagnoses: Patient Active Problem List   Diagnosis Date Noted  . Left medial knee pain 07/31/2016  . Essential hypertension 11/17/2015  . Alcohol abuse   . 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  Substance induced mood disorder (HCC) 02/12/2015  . Polysubstance abuse (HCC)   . Wrist pain, chronic 12/22/2014  . Trochanteric bursitis of left hip 12/22/2014  . Seasonal allergies 09/12/2014  . Tobacco dependence 09/12/2014  . Shortness of breath 09/12/2014  . Diabetes mellitus type 2, insulin dependent (HCC) 04/01/2014  . Pain due to onychomycosis of toenail 04/01/2014  . Cocaine abuse (HCC) 04/01/2014    Patient Centered Plan: Patient is on the following Treatment Plan(s):  Depression and Substance Abuse   Referrals to Alternative Service(s): Referred to Alternative Service(s):   Place:   Date:   Time:    Referred to Alternative Service(s):   Place:   Date:   Time:    Referred to Alternative Service(s):   Place:   Date:   Time:    Referred to Alternative Service(s):   Place:   Date:   Time:     Ralph Dowdy

## 2019-12-13 NOTE — ED Notes (Signed)
Contacted Safe transport for status update on pt transfer to [facility]

## 2019-12-13 NOTE — BH Assessment (Signed)
[facility] Assessment Progress Note  Per Caryn Bee, DNP, this pt requires psychiatric hospitalization at this time.  At 13:52 [place] calls from [facility].  Pt has been accepted to their facility by Dr Forrestine Him to the [facility] unit.  Fredna Dow, concurs with this disposition, as does the pt who is currently under voluntary status.  Pt's nurse has been notified, and agrees to call report to [phone].  Pt is to be transported via [employer].  Doylene Canning, [place] Behavioral Health Coordinator [phone]

## 2019-12-19 MED FILL — DOXEPIN HCL 100 MG CAPS: 100 | 30 days supply | Qty: 30 | Fill #0

## 2019-12-24 ENCOUNTER — Ambulatory Visit (INDEPENDENT_AMBULATORY_CARE_PROVIDER_SITE_OTHER): Payer: Medicaid Other | Admitting: Psychiatry

## 2019-12-24 ENCOUNTER — Encounter (HOSPITAL_COMMUNITY): Payer: Self-pay | Admitting: Psychiatry

## 2019-12-24 ENCOUNTER — Other Ambulatory Visit: Payer: Self-pay

## 2019-12-24 DIAGNOSIS — F332 Major depressive disorder, recurrent severe without psychotic features: Secondary | ICD-10-CM | POA: Diagnosis not present

## 2019-12-24 MED ORDER — ARIPIPRAZOLE (SENSOR) 10 MG PO TABS: 10.0000 mg | ORAL_TABLET | Freq: Every day | ORAL | 1 refills | Status: DC

## 2019-12-24 MED ORDER — TRAZODONE HCL 50 MG PO TABS: 25.0000 mg | ORAL_TABLET | Freq: Every evening | ORAL | 1 refills | Status: DC | PRN

## 2019-12-24 MED FILL — ARIPiprazole 10 MG TABS: 10 | 14 days supply | Qty: 14 | Fill #0

## 2019-12-24 MED FILL — traZODone HCL 50 MG TABS: 50 | 30 days supply | Qty: 15 | Fill #0
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
## 2019-12-26 ENCOUNTER — Telehealth (HOSPITAL_COMMUNITY): Payer: Self-pay | Admitting: *Deleted

## 2019-12-26 NOTE — Telephone Encounter (Signed)
Pre authorization done for patients Abilify. Case # [id number]. Authorization ends on 12/25/2020. Pharmacy notified.

## 2019-12-30 ENCOUNTER — Telehealth: Payer: Self-pay | Admitting: Nurse Practitioner

## 2019-12-30 NOTE — Telephone Encounter (Signed)
Copied from CRM [id number]. Topic: Referral - Question >> Dec 30, 2019  9:27 AM Elliot Gault wrote: Reason for CRM: patient would like a referral to ophthalmologist, patient states she cant see and would like glasses. Patient has a upcoming CPE on  8/5 and would like a reminder call

## 2020-01-02 NOTE — Telephone Encounter (Signed)
Spoke to patient and informed referral coordinator placed her with [facility].  Pt. Is aware they will call her to schedule an appt.

## 2020-01-08 ENCOUNTER — Ambulatory Visit (INDEPENDENT_AMBULATORY_CARE_PROVIDER_SITE_OTHER): Payer: Medicaid Other | Admitting: Psychiatry

## 2020-01-08 ENCOUNTER — Other Ambulatory Visit: Payer: Self-pay | Admitting: Nurse Practitioner

## 2020-01-08 ENCOUNTER — Other Ambulatory Visit: Payer: Self-pay

## 2020-01-08 ENCOUNTER — Encounter (HOSPITAL_COMMUNITY): Payer: Self-pay | Admitting: Psychiatry

## 2020-01-08 ENCOUNTER — Encounter: Payer: Self-pay | Admitting: Nurse Practitioner

## 2020-01-08 ENCOUNTER — Other Ambulatory Visit (HOSPITAL_COMMUNITY)
Admission: RE | Admit: 2020-01-08 | Discharge: 2020-01-08 | Disposition: A | Discharge status: Home / Self Care | Payer: Medicaid Other | Source: Ambulatory Visit | Attending: Nurse Practitioner | Admitting: Nurse Practitioner

## 2020-01-08 ENCOUNTER — Ambulatory Visit: Payer: Medicaid Other | Attending: Nurse Practitioner | Admitting: Nurse Practitioner

## 2020-01-08 VITALS — BP 163/92 | HR 90 | Temp 97.7°F | Ht 63.39 in | Wt 147.6 lb

## 2020-01-08 DIAGNOSIS — Z114 Encounter for screening for human immunodeficiency virus [HIV]: Secondary | ICD-10-CM

## 2020-01-08 DIAGNOSIS — Z794 Long term (current) use of insulin: Secondary | ICD-10-CM

## 2020-01-08 DIAGNOSIS — E1165 Type 2 diabetes mellitus with hyperglycemia: Secondary | ICD-10-CM

## 2020-01-08 DIAGNOSIS — Z124 Encounter for screening for malignant neoplasm of cervix: Secondary | ICD-10-CM

## 2020-01-08 DIAGNOSIS — F332 Major depressive disorder, recurrent severe without psychotic features: Secondary | ICD-10-CM

## 2020-01-08 DIAGNOSIS — Z1231 Encounter for screening mammogram for malignant neoplasm of breast: Secondary | ICD-10-CM

## 2020-01-08 DIAGNOSIS — I10 Essential (primary) hypertension: Secondary | ICD-10-CM

## 2020-01-08 DIAGNOSIS — G2401 Drug induced subacute dyskinesia: Secondary | ICD-10-CM

## 2020-01-08 LAB — GLUCOSE, POCT (MANUAL RESULT ENTRY): POC Glucose: 219 mg/dl — AB (ref 70–99)

## 2020-01-08 MED ORDER — JANUMET 50-1000 MG PO TABS: 1.0000 | ORAL_TABLET | Freq: Two times a day (BID) | ORAL | 3 refills | Status: DC

## 2020-01-08 MED ORDER — AUSTEDO 12 MG PO TABS: 18.0000 mg | ORAL_TABLET | Freq: Two times a day (BID) | ORAL | 2 refills | Status: DC

## 2020-01-08 MED ORDER — TRAZODONE HCL 50 MG PO TABS: 25.0000 mg | ORAL_TABLET | Freq: Every evening | ORAL | 2 refills | Status: DC | PRN

## 2020-01-08 MED ORDER — LISINOPRIL 40 MG PO TABS: 40.0000 mg | ORAL_TABLET | Freq: Every day | ORAL | 0 refills | Status: DC

## 2020-01-08 MED ORDER — ARIPIPRAZOLE (SENSOR) 10 MG PO TABS: 10.0000 mg | ORAL_TABLET | Freq: Every day | ORAL | 2 refills | Status: DC

## 2020-01-08 MED FILL — JANUMET 50-1,000 MG TABLET: 50-1000 | 14 days supply | Qty: 28 | Fill #0

## 2020-01-08 MED FILL — LISINOPRIL 40 MG TABLET: 40 | 90 days supply | Qty: 90 | Fill #0

## 2020-01-08 MED FILL — traZODone HCL 50 MG TABS: 50 | 30 days supply | Qty: 30 | Fill #0

## 2020-01-08 MED FILL — ARIPiprazole 10 MG TABS: 10 | 30 days supply | Qty: 30 | Fill #0

## 2020-01-08 NOTE — Progress Notes (Signed)
BH MD/PA/NP OP Progress Note  01/08/2020 10:33 AM Robin Montgomery  MRN:  [id number]  Chief Complaint:  "The rocking's better but its still there"  HPI: 59 year old female seen today for follow up psychiatric evaluation.    She has a psychiatric history of cocaine use disorder, alcohol use disorder, and substance-induced mood disorder.  She is currently being managed on Abilify 10 mg, Austedo, and trazodone 25 mg-51m mg at bedtime.  Today she notes she was unable to fill her prescriptions due to not being able to afford them.  Today she notes that symptoms of depression are improving. She notes that since stopping the Doxepin she has had less headaches. She also notes that Austedo has improved her Tardive Dyskinesia. She denies anxiety however she notes she worries abour her two children who live with her. She denies symptoms of mania and SI/HI/VH.  She notes that she has been taking over the counter meds to assist with sleep however reports that she needs more sleep.   Provider conducted aims assessment today.  Patient was unable to sit still.  She was noted to rock back and forth.  Her rocking however is less pronounced than at last visit. She also had mild tongue twitching.  Patient is agreeable to increase Austedo to 18 mg BID and continue all other medications as prescribed. Potential side effects of medication and risks vs benefits of treatment vs non-treatment were explained and discussed. All questions were answered. She will continue all other medications as prescribed.  No other concerns noted at this time. Visit Diagnosis:    ICD-10-CM   1. Tardive dyskinesia  G24.01 Deutetrabenazine (AUSTEDO) 12 MG TABS  2. MDD (major depressive disorder), recurrent severe, without psychosis ([place]  F33.2 ARIPiprazole, sensor, 10 MG TABS    traZODone (DESYREL) 50 MG tablet    Past Psychiatric History: cocaine use disorder, alcohol use disorder, and substance-induced mood disorder.  Past Medical  History:  Past Medical History:  Diagnosis Date  . Depression   . Diabetes mellitus without complication ([place]   . Hypertension     Past Surgical History:  Procedure Laterality Date  . CHOLECYSTECTOMY      Family Psychiatric History: Patient notes that she is adopted. She is unaware of family history however notes that her daughter and son has mental health issues Family History: History reviewed. No pertinent family history.  Social History:  Social History   Socioeconomic History  . Marital status: Single    Spouse name: Not on file  . Number of children: Not on file  . Years of education: Not on file  . Highest education level: Not on file  Occupational History  . Not on file  Tobacco Use  . Smoking status: Current Every Day Smoker    Packs/day: 0.25    Types: Cigarettes  . Smokeless tobacco: Former USystems developer   Types: Snuff  Vaping Use  . Vaping Use: Never used  Substance and Sexual Activity  . Alcohol use: Yes    Alcohol/week: 0.0 standard drinks    Comment: every other week   . Drug use: Yes    Types: Cocaine, Marijuana  . Sexual activity: Not on file  Other Topics Concern  . Not on file  Social History Narrative   Smoker   Cocaine use off and on for past 20 years   Living in motel   Living with 59yo and 248something year old child    Social Determinants of HRadio broadcast assistant  Strain:   . Difficulty of Paying Living Expenses:   Food Insecurity:   . Worried About Charity fundraiser in the Last Year:   . Arboriculturist in the Last Year:   Transportation Needs:   . Film/video editor (Medical):   Marland Kitchen Lack of Transportation (Non-Medical):   Physical Activity:   . Days of Exercise per Week:   . Minutes of Exercise per Session:   Stress:   . Feeling of Stress :   Social Connections:   . Frequency of Communication with Friends and Family:   . Frequency of Social Gatherings with Friends and Family:   . Attends Religious Services:   . Active  Member of Clubs or Organizations:   . Attends Archivist Meetings:   Marland Kitchen Marital Status:     Allergies:  Allergies  Allergen Reactions  . Aspirin Nausea And Vomiting    Upset stomach    Metabolic Disorder Labs: Lab Results  Component Value Date   HGBA1C 10.5 (H) 11/18/2019   MPG 232 (H) 03/20/2014   MPG 131 (H) 01/07/2010   No results found for: PROLACTIN Lab Results  Component Value Date   CHOL 263 (H) 11/18/2019   TRIG 265 (H) 11/18/2019   HDL 61 11/18/2019   CHOLHDL 4.3 11/18/2019   VLDL 40 03/19/2014   LDLCALC 154 (H) 11/18/2019   LDLCALC 173 (H) 10/02/2018   Lab Results  Component Value Date   TSH 1.330 03/20/2014   TSH 1.017 01/07/2010    Therapeutic Level Labs: No results found for: LITHIUM No results found for: VALPROATE No components found for:  CBMZ  Current Medications: Current Outpatient Medications  Medication Sig Dispense Refill  . Accu-Chek Softclix Lancets lancets Use as instructed to check blood sugar twice daily. 100 each 6  . acetaminophen (TYLENOL) 500 MG tablet Take 1 tablet (500 mg total) by mouth every 6 (six) hours as needed. (Patient taking differently: Take 500 mg by mouth every 6 (six) hours as needed for moderate pain. ) 30 tablet 0  . albuterol (VENTOLIN HFA) 108 (90 Base) MCG/ACT inhaler INHALE 1 PUFF INTO THE LUNGS EVERY 6 HOURS AS NEEDED FOR WHEEZING OR SHORTNESS OF BREATH. 18 g 1  . ARIPiprazole, sensor, 10 MG TABS Take 10 mg by mouth daily. 30 tablet 2  . atorvastatin (LIPITOR) 40 MG tablet Take 1 tablet (40 mg total) by mouth daily. 90 tablet 3  . Blood Glucose Monitoring Suppl (ACCU-CHEK GUIDE ME) w/Device KIT Use to check blood sugar twice daily. 1 kit 0  . Deutetrabenazine (AUSTEDO) 12 MG TABS Take 18 mg by mouth in the morning and at bedtime. 60 tablet 2  . glucose blood (TRUE METRIX BLOOD GLUCOSE TEST) test strip Use as instructed 100 each 12  . insulin detemir (LEVEMIR FLEXTOUCH) 100 UNIT/ML FlexPen Inject 30 Units  into the skin 2 (two) times daily. 54 mL 1  . Insulin Pen Needle (TRUEPLUS PEN NEEDLES) 31G X 5 MM MISC USE AS INSTRUCTED. INJECT INTO THE SKIN ONCE NIGHTLY. E11.65 100 each 3  . lisinopril (ZESTRIL) 20 MG tablet Take 1 tablet (20 mg total) by mouth daily. 180 tablet 1  . omeprazole (PRILOSEC) 20 MG capsule Take 2 capsules (40 mg total) by mouth daily. 60 capsule 0  . sitaGLIPtin-metformin (JANUMET) 50-1000 MG tablet Take 1 tablet by mouth 2 (two) times daily with a meal. 60 tablet 3  . traZODone (DESYREL) 50 MG tablet Take 0.5 tablets (25 mg total) by  mouth at bedtime as needed for sleep. 30 tablet 2   No current facility-administered medications for this visit.     Musculoskeletal: Strength & Muscle Tone: within normal limits Gait & Station: normal Patient leans: N/A  Psychiatric Specialty Exam: Review of Systems  There were no vitals taken for this visit.There is no height or weight on file to calculate BMI.  General Appearance: Well Groomed  Eye Contact:  Good  Speech:  Clear and Coherent and Normal Rate  Volume:  Normal  Mood:  Euthymic  Affect:  Congruent  Thought Process:  Coherent, Goal Directed and Linear  Orientation:  Full (Time, Place, and Person)  Thought Content: WDL and Logical   Suicidal Thoughts:  No  Homicidal Thoughts:  No  Memory:  Immediate;   Good Recent;   Good Remote;   Good  Judgement:  Good  Insight:  Good  Psychomotor Activity:  EPS  Concentration:  Concentration: Good and Attention Span: Good  Recall:  Good  Fund of Knowledge: Good  Language: Good  Akathisia:  No  Handed:  Right  AIMS (if indicated): not done  Assets:  Communication Skills Desire for Improvement Housing Social Support  ADL's:  Intact  Cognition: WNL  Sleep:  Fair   Screenings: AUDIT     Admission (Discharged) from 03/19/2014 in [facility]  Alcohol Use Disorder Identification Test Final Score (AUDIT) 30    GAD-7     Office Visit  from 12/03/2019 in [place] Office Visit from 08/28/2019 in [place] Office Visit from 06/09/2017 in [place] Office Visit from 07/28/2016 in [place] Office Visit from 11/17/2015 in [place]  Total GAD-7 Score _0 PHQ2-9     Office Visit from 12/03/2019 in [place] Office Visit from 08/28/2019 in [place] Office Visit from 06/09/2017 in [place] Visit from 07/28/2016 in [place] Office Visit from 11/17/2015 in [place]  PHQ-2 Total Score _1 PHQ-9 Total Score _2 Assessment and Plan: Patient notes that her symptoms of anxiety anddepression are improving. She notes that since stopping the Doxepin she has had less headaches. She also notes that Austedo has improved her Tardive Dyskinesia.  Patient is agreeable to increase Austedo to 18 mg BID and continue all other medications as prescribed.   1. MDD (major depressive disorder), recurrent severe, without psychosis ([place])  Continue- ARIPiprazole, sensor, 10 MG TABS; Take 10 mg by mouth daily.  Dispense: 30 tablet; Refill: 2 Continue- traZODone (DESYREL) 50 MG tablet; Take 0.5 tablets (25 mg total) by mouth at bedtime as needed for sleep.  Dispense: 30 tablet; Refill: 2  2. Tardive dyskinesia  Increased- Deutetrabenazine (AUSTEDO) 12 MG TABS; Take 18 mg by mouth in the morning and at bedtime.  Dispense: 60 tablet; Refill: 2  Follow up in 2 months  Salley Slaughter, NP 01/08/2020, 10:33 AM

## 2020-01-08 NOTE — Progress Notes (Signed)
Assessment & Plan:  Robin Montgomery was seen today for gynecologic exam.  Diagnoses and all orders for this visit:  Encounter for Papanicolaou smear for cervical cancer screening -     Cytology - PAP -     Cervicovaginal ancillary only  Type 2 diabetes mellitus with hyperglycemia, with long-term current use of insulin (HCC) -     Glucose (CBG) -     sitaGLIPtin-metformin (JANUMET) 50-1000 MG tablet; Take 1 tablet by mouth 2 (two) times daily with a meal.  Encounter for screening for HIV -     HIV antibody (with reflex)  Essential hypertension -     lisinopril (ZESTRIL) 40 MG tablet; Take 1 tablet (40 mg total) by mouth daily. Continue all antihypertensives as prescribed.  Remember to bring in your blood pressure log with you for your follow up appointment.  DASH/Mediterranean Diets are healthier choices for HTN.   Breast cancer screening by mammogram -     MM 3D SCREEN BREAST BILATERAL; Future    Patient has been counseled on age-appropriate routine health concerns for screening and prevention. These are reviewed and up-to-date. Referrals have been placed accordingly. Immunizations are up-to-date or declined.    Subjective:   Chief Complaint  Patient presents with   Gynecologic Exam    Pt. is here for a pap smear and BP check.    HPI Robin Montgomery 59 y.o. female presents to office today for PAP smear, breast exam and requesting referral to ophthalmologist.   Essential Hypertension Elevated/not well controlled. Will increase lisinopril 20 mg to 40 mg today. Denies chest pain, shortness of breath, palpitations, lightheadedness, dizziness, headaches or BLE edema. She continues to smoke. Not ready to quit.  BP Readings from Last 3 Encounters:  01/08/20 (!) 163/92  12/03/19 (!) 156/82  08/19/19 (!) 193/102    DM TYPE 2 Poorly controlled. She has been out of her janumet 50-1000 mg. Endorses adherence taking levemir 30 units BID. However A1c is not indicative of medication  adherence. On STATIN and ACE. Overdue for eye exam. She is not diet or exercise adherent.  Lab Results  Component Value Date   HGBA1C 10.5 (H) 11/18/2019    Review of Systems  Constitutional: Negative.  Negative for chills, fever, malaise/fatigue and weight loss.  Respiratory: Negative.  Negative for cough, shortness of breath and wheezing.   Cardiovascular: Negative.  Negative for chest pain, orthopnea and leg swelling.  Gastrointestinal: Negative for abdominal pain.  Genitourinary: Negative.  Negative for flank pain.  Skin: Negative.  Negative for rash.  Psychiatric/Behavioral: Negative for suicidal ideas.    Past Medical History:  Diagnosis Date   Depression    Diabetes mellitus without complication ([place])    Hypertension     Past Surgical History:  Procedure Laterality Date   CHOLECYSTECTOMY      History reviewed. No pertinent family history.  Social History Reviewed with no changes to be made today.   Outpatient Medications Prior to Visit  Medication Sig Dispense Refill   Accu-Chek Softclix Lancets lancets Use as instructed to check blood sugar twice daily. 100 each 6   acetaminophen (TYLENOL) 500 MG tablet Take 1 tablet (500 mg total) by mouth every 6 (six) hours as needed. (Patient taking differently: Take 500 mg by mouth every 6 (six) hours as needed for moderate pain. ) 30 tablet 0   albuterol (VENTOLIN HFA) 108 (90 Base) MCG/ACT inhaler INHALE 1 PUFF INTO THE LUNGS EVERY 6 HOURS AS NEEDED FOR WHEEZING OR SHORTNESS  OF BREATH. 18 g 1   ARIPiprazole, sensor, 10 MG TABS Take 10 mg by mouth daily. 30 tablet 2   atorvastatin (LIPITOR) 40 MG tablet Take 1 tablet (40 mg total) by mouth daily. 90 tablet 3   Blood Glucose Monitoring Suppl (ACCU-CHEK GUIDE ME) w/Device KIT Use to check blood sugar twice daily. 1 kit 0   Deutetrabenazine (AUSTEDO) 12 MG TABS Take 18 mg by mouth in the morning and at bedtime. 60 tablet 2   glucose blood (TRUE METRIX BLOOD GLUCOSE  TEST) test strip Use as instructed 100 each 12   insulin detemir (LEVEMIR FLEXTOUCH) 100 UNIT/ML FlexPen Inject 30 Units into the skin 2 (two) times daily. 54 mL 1   Insulin Pen Needle (TRUEPLUS PEN NEEDLES) 31G X 5 MM MISC USE AS INSTRUCTED. INJECT INTO THE SKIN ONCE NIGHTLY. E11.65 100 each 3   omeprazole (PRILOSEC) 20 MG capsule Take 2 capsules (40 mg total) by mouth daily. 60 capsule 0   traZODone (DESYREL) 50 MG tablet Take 0.5 tablets (25 mg total) by mouth at bedtime as needed for sleep. 30 tablet 2   lisinopril (ZESTRIL) 20 MG tablet Take 1 tablet (20 mg total) by mouth daily. 180 tablet 1   sitaGLIPtin-metformin (JANUMET) 50-1000 MG tablet Take 1 tablet by mouth 2 (two) times daily with a meal. 60 tablet 3   No facility-administered medications prior to visit.    Allergies  Allergen Reactions   Aspirin Nausea And Vomiting    Upset stomach       Objective:    BP (!) 163/92 (BP Location: Left Arm, Patient Position: Sitting, Cuff Size: Normal)    Pulse 90    Temp 97.7 F (36.5 C) (Temporal)    Ht 5' 3.39" (1.61 m)    Wt 147 lb 9.6 oz (67 kg)    SpO2 97%    BMI 25.83 kg/m  Wt Readings from Last 3 Encounters:  01/08/20 147 lb 9.6 oz (67 kg)  12/03/19 147 lb (66.7 kg)  08/19/19 154 lb (69.9 kg)    Physical Exam Constitutional:      Appearance: She is well-developed.  HENT:     Head: Normocephalic.  Cardiovascular:     Rate and Rhythm: Normal rate and regular rhythm.     Heart sounds: Normal heart sounds.  Pulmonary:     Effort: Pulmonary effort is normal.     Breath sounds: Normal breath sounds.  Chest:     Breasts:        Right: No swelling, inverted nipple or nipple discharge.        Left: No swelling, inverted nipple or nipple discharge.     Comments: Numerous cysts in breasts with more prominent cyst in the upper left breast quadrant at the 2 o'clock position.  Abdominal:     General: Bowel sounds are normal.     Palpations: Abdomen is soft.     Hernia:  There is no hernia in the left inguinal area.  Genitourinary:    Labia:        Right: No rash, tenderness, lesion or injury.        Left: No rash, tenderness, lesion or injury.      Vagina: Normal. No signs of injury and foreign body. No vaginal discharge, erythema, tenderness or bleeding.     Cervix: No cervical motion tenderness or friability.     Uterus: Not deviated and not enlarged.      Adnexa:  Right: No mass, tenderness or fullness.         Left: No mass, tenderness or fullness.       Rectum: Normal. No external hemorrhoid.  Lymphadenopathy:     Upper Body:     Right upper body: No supraclavicular, axillary or pectoral adenopathy.     Left upper body: No supraclavicular, axillary or pectoral adenopathy.     Lower Body: No right inguinal adenopathy. No left inguinal adenopathy.  Skin:    General: Skin is warm and dry.  Neurological:     Mental Status: She is alert and oriented to person, place, and time.  Psychiatric:        Behavior: Behavior normal.        Thought Content: Thought content normal.        Judgment: Judgment normal.          Patient has been counseled extensively about nutrition and exercise as well as the importance of adherence with medications and regular follow-up. The patient was given clear instructions to go to ER or return to medical center if symptoms don't improve, worsen or new problems develop. The patient verbalized understanding.   Follow-up: Return in about 3 weeks (around 01/29/2020) for BP CHECK WITH LUKE; [place], FNP-BC [facility] [place], [place]   01/08/2020, 3:46 PM

## 2020-01-09 ENCOUNTER — Encounter: Payer: Self-pay | Admitting: Nurse Practitioner

## 2020-01-09 LAB — CERVICOVAGINAL ANCILLARY ONLY
Bacterial Vaginitis (gardnerella): POSITIVE — AB
Candida Glabrata: NEGATIVE
Candida Vaginitis: NEGATIVE
Chlamydia: NEGATIVE
Comment: NEGATIVE
Comment: NEGATIVE
Comment: NEGATIVE
Comment: NEGATIVE
Comment: NEGATIVE
Comment: NORMAL
Neisseria Gonorrhea: NEGATIVE
Trichomonas: NEGATIVE

## 2020-01-09 LAB — CYTOLOGY - PAP
Comment: NEGATIVE
Diagnosis: NEGATIVE
High risk HPV: NEGATIVE

## 2020-01-09 LAB — HIV ANTIBODY (ROUTINE TESTING W REFLEX): HIV Screen 4th Generation wRfx: NONREACTIVE

## 2020-01-09 MED FILL — LEVEMIR FLEXTOUCH 100 UNITS: 100 | 25 days supply | Qty: 15 | Fill #0

## 2020-01-10 ENCOUNTER — Other Ambulatory Visit: Payer: Self-pay | Admitting: Nurse Practitioner

## 2020-01-10 MED ORDER — METRONIDAZOLE 500 MG PO TABS: 500.0000 mg | ORAL_TABLET | Freq: Two times a day (BID) | ORAL | 0 refills | Status: AC

## 2020-01-10 MED FILL — metroNIDAZOLE 500 MG TABS: 500 | 7 days supply | Qty: 14 | Fill #0

## 2020-01-28 NOTE — Progress Notes (Unsigned)
   S:     PCP: Bertram Denver, NP  Patient arrives in good spirits. Presents to the clinic for hypertension evaluation, counseling, and management.  Patient was referred and last seen by Primary Care Provider on 01/08/2020 in which lisinopril was increased from 20 to 40 mg daily.   PMH: HTN, T2DM, depression, cocaine use disorder, alcohol use disorder, and substance-induced mood disorder  Today, ***  CHECK BMP d/t lisinopril dose increase   Compliance? Focus (non-compliant with DM meds) Took meds this morning? When do you take your meds? Dizziness, headaches, blurred vision? History of swelling? Check Clinic BP? Home BP logs? If no logs, bring to next visit w/ BP cuff Go over BP goals Additional BP therapy if needed . Amlodipine  Diet??  Exercise??   Insurance coverage: [place] [employer] Healthy [employer]  Medication adherence *** .  Current BP Medications include:  Lisinopril 40 mg daily  Dietary habits include: *** Exercise habits include:*** Family / Social history:  -Fhx: *** -Tobacco use: Current every day smoker - 0.25 ppd. Former chewing tobacco user. Illicit drugs: Cocaine use off and on for past 20 years; marijuana  -Alcohol use: yes   ASCVD risk factors include: HTN, DM, tobacco smoker  O:   Home BP readings: ***  Last 3 Office BP readings: BP Readings from Last 3 Encounters:  01/08/20 (!) 163/92  12/13/19 134/67  12/03/19 (!) 156/82    BMET    Component Value Date/Time   NA 141 12/12/2019 1830   NA 135 11/18/2019 1422   K 3.4 (L) 12/12/2019 1830   CL 101 12/12/2019 1830   CO2 23 12/12/2019 1830   GLUCOSE 214 (H) 12/12/2019 1830   BUN 7 12/12/2019 1830   BUN 8 11/18/2019 1422   CREATININE 0.51 12/12/2019 1830   CALCIUM 9.1 12/12/2019 1830   GFRNONAA >60 12/12/2019 1830   GFRAA >60 12/12/2019 1830    Renal function: CrCl cannot be calculated (Patient's most recent lab result is older than the maximum 21 days allowed.).  Clinical ASCVD: No  The  10-year ASCVD risk score Denman George DC Jr., et al., 2013) is: 55.1%   Values used to calculate the score:     Age: 59 years     Sex: Female     Is Non-Hispanic African American: Yes     Diabetic: Yes     Tobacco smoker: Yes     Systolic Blood Pressure: 163 mmHg     Is BP treated: Yes     HDL Cholesterol: 61 mg/dL     Total Cholesterol: 263 mg/dL  PHQ-2 Score: ***   A/P: Hypertension diagnosed *** currently *** on current medications. BP Goal = < *** mmHg. Medication adherence ***.  -{Meds adjust:18428} ***.  -F/u labs ordered - *** -Counseled on lifestyle modifications for blood pressure control including reduced dietary sodium, increased exercise, adequate sleep.  Results reviewed and written information provided.   Total time in face-to-face counseling *** minutes.   F/U Clinic Visit in ***.    Fabio Neighbors, PharmD PGY2 Ambulatory Care Resident [facility]

## 2020-01-29 ENCOUNTER — Ambulatory Visit: Payer: Medicaid Other | Admitting: Pharmacist

## 2020-03-10 ENCOUNTER — Encounter (HOSPITAL_COMMUNITY): Payer: Medicaid Other | Admitting: Psychiatry

## 2020-03-26 ENCOUNTER — Other Ambulatory Visit: Payer: Self-pay | Admitting: Nurse Practitioner

## 2020-03-26 DIAGNOSIS — I10 Essential (primary) hypertension: Secondary | ICD-10-CM

## 2020-03-26 MED FILL — TECHLITE PEN NEEDLES 31G X: 31G X 5 MM | 34 days supply | Qty: 100 | Fill #0

## 2020-03-26 MED FILL — JANUMET 50-1,000 MG TABLET: 50-1000 | 14 days supply | Qty: 28 | Fill #1

## 2020-03-26 MED FILL — PROAIR HFA 90 MCG INHALER: 108 (90 BAS | 25 days supply | Qty: 9 | Fill #0

## 2020-04-01 MED FILL — LEVEMIR FLEXTOUCH 100 UNITS: 100 | 24 days supply | Qty: 15 | Fill #0

## 2020-04-23 ENCOUNTER — Emergency Department (HOSPITAL_COMMUNITY): Payer: Medicaid Other

## 2020-04-23 ENCOUNTER — Encounter (HOSPITAL_COMMUNITY): Payer: Medicaid Other | Admitting: Psychiatry

## 2020-04-23 ENCOUNTER — Emergency Department (HOSPITAL_COMMUNITY)
Admission: EM | Admit: 2020-04-23 | Discharge: 2020-04-23 | Disposition: A | Discharge status: Home / Self Care | Payer: Medicaid Other | Attending: Emergency Medicine | Admitting: Emergency Medicine

## 2020-04-23 ENCOUNTER — Encounter (HOSPITAL_COMMUNITY): Payer: Self-pay

## 2020-04-23 DIAGNOSIS — I10 Essential (primary) hypertension: Secondary | ICD-10-CM | POA: Insufficient documentation

## 2020-04-23 DIAGNOSIS — E119 Type 2 diabetes mellitus without complications: Secondary | ICD-10-CM | POA: Insufficient documentation

## 2020-04-23 DIAGNOSIS — F17210 Nicotine dependence, cigarettes, uncomplicated: Secondary | ICD-10-CM | POA: Insufficient documentation

## 2020-04-23 DIAGNOSIS — R079 Chest pain, unspecified: Secondary | ICD-10-CM | POA: Insufficient documentation

## 2020-04-23 DIAGNOSIS — R202 Paresthesia of skin: Secondary | ICD-10-CM | POA: Diagnosis not present

## 2020-04-23 DIAGNOSIS — S0990XA Unspecified injury of head, initial encounter: Secondary | ICD-10-CM | POA: Diagnosis not present

## 2020-04-23 DIAGNOSIS — S299XXA Unspecified injury of thorax, initial encounter: Secondary | ICD-10-CM | POA: Diagnosis not present

## 2020-04-23 DIAGNOSIS — Y9389 Activity, other specified: Secondary | ICD-10-CM | POA: Insufficient documentation

## 2020-04-23 DIAGNOSIS — S4992XA Unspecified injury of left shoulder and upper arm, initial encounter: Secondary | ICD-10-CM | POA: Diagnosis not present

## 2020-04-23 DIAGNOSIS — Y92410 Unspecified street and highway as the place of occurrence of the external cause: Secondary | ICD-10-CM | POA: Insufficient documentation

## 2020-04-23 DIAGNOSIS — Z794 Long term (current) use of insulin: Secondary | ICD-10-CM | POA: Insufficient documentation

## 2020-04-23 DIAGNOSIS — Z79899 Other long term (current) drug therapy: Secondary | ICD-10-CM | POA: Insufficient documentation

## 2020-04-23 DIAGNOSIS — M545 Low back pain, unspecified: Secondary | ICD-10-CM | POA: Diagnosis not present

## 2020-04-23 DIAGNOSIS — J984 Other disorders of lung: Secondary | ICD-10-CM | POA: Diagnosis not present

## 2020-04-23 DIAGNOSIS — M546 Pain in thoracic spine: Secondary | ICD-10-CM | POA: Diagnosis not present

## 2020-04-23 DIAGNOSIS — M542 Cervicalgia: Secondary | ICD-10-CM | POA: Diagnosis not present

## 2020-04-23 LAB — CBG MONITORING, ED: Glucose-Capillary: 132 mg/dL — ABNORMAL HIGH (ref 70–99)

## 2020-04-23 MED ORDER — ACETAMINOPHEN 325 MG PO TABS
650.0000 mg | ORAL_TABLET | Freq: Once | ORAL | Status: AC
  Administered 2020-04-23: 650 mg via ORAL
  Filled 2020-04-23: qty 2

## 2020-04-23 NOTE — Discharge Instructions (Signed)
Please take Tylenol (acetaminophen) to relieve your pain.  You may take tylenol, up to 1,000 mg (two extra strength pills).  Do not take more than 3,000 mg tylenol in a 24 hour period.  Please check all medication labels as many medications such as pain and cold medications may contain tylenol. Please do not drink alcohol while taking this medication.  ° °
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
## 2020-04-23 NOTE — ED Notes (Signed)
Patient swallowed meds with no difficulty with a couple sips of water.
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
## 2020-05-21 ENCOUNTER — Telehealth: Payer: Self-pay | Admitting: Nurse Practitioner

## 2020-05-21 MED FILL — LISINOPRIL 40 MG TABS: 40 | 30 days supply | Qty: 30 | Fill #0

## 2020-05-21 NOTE — Telephone Encounter (Signed)
Copied from CRM [id number]. Topic: Quick Communication - Rx Refill/Question >> May 21, 2020 12:53 PM Randol Kern wrote: Best contact: [phone]   Pt is completely out of her blood pressure medication, she is now homeless and is locked out of her house. She doesn't know the name of her BP medication.   Called patient to clarify what was going on. Someone other than patient picked up and stated patient was not home. Asked person who answered to tell patient to call back [phone] about her concern. Gave no further information. Please follow up.

## 2020-05-25 ENCOUNTER — Encounter (HOSPITAL_COMMUNITY): Payer: Medicaid Other | Admitting: Psychiatry
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
## 2020-05-27 ENCOUNTER — Ambulatory Visit: Payer: Medicaid Other | Admitting: Physician Assistant

## 2020-05-28 NOTE — Telephone Encounter (Signed)
Unable to reach patient, and patient no show to her appt. 05/27/2020.

## 2020-06-15 MED FILL — BD PEN NDL MINI 31GX5MM: 31G X 5 MM | 34 days supply | Qty: 100 | Fill #1

## 2020-06-15 MED FILL — LEVEMIR FLEXTOUCH 100 UNITS: 100 | 24 days supply | Qty: 15 | Fill #1

## 2020-06-15 MED FILL — ATORVASTATIN CALCIUM 40 MG: 40 | 30 days supply | Qty: 30 | Fill #0

## 2020-06-15 MED FILL — LISINOPRIL 40 MG TABLET: 40 | 30 days supply | Qty: 30 | Fill #0

## 2020-06-15 MED FILL — PROAIR HFA 90 MCG INHALER: 108 (90 BAS | 25 days supply | Qty: 9 | Fill #1

## 2020-08-05 ENCOUNTER — Ambulatory Visit: Payer: Medicaid Other | Admitting: Nurse Practitioner

## 2020-08-10 MED FILL — LEVEMIR FLEXTOUCH 100 UNITS: 100 | 24 days supply | Qty: 15 | Fill #2

## 2020-08-10 MED FILL — LISINOPRIL 40 MG TAB: 40 | 30 days supply | Qty: 30 | Fill #1

## 2020-08-10 MED FILL — BD PEN NDL MINI 31GX5MM: 31G X 5 MM | 34 days supply | Qty: 100 | Fill #2

## 2020-08-10 MED FILL — ATORVASTATIN CALCIUM 40 MG: 40 | 30 days supply | Qty: 30 | Fill #1

## 2020-09-21 ENCOUNTER — Telehealth: Payer: Self-pay | Admitting: Nurse Practitioner

## 2020-09-21 ENCOUNTER — Other Ambulatory Visit: Payer: Self-pay

## 2020-09-21 MED FILL — Lisinopril Tab 40 MG: ORAL | 30 days supply | Qty: 30 | Fill #0 | Status: AC

## 2020-09-21 MED FILL — Insulin Detemir Soln Pen-injector 100 Unit/ML: SUBCUTANEOUS | 90 days supply | Qty: 54 | Fill #0 | Status: AC

## 2020-09-21 MED FILL — Atorvastatin Calcium Tab 40 MG (Base Equivalent): ORAL | 90 days supply | Qty: 90 | Fill #0 | Status: AC

## 2020-09-21 NOTE — Telephone Encounter (Signed)
Pharmacy has script on  file and will get them ready for pick up.

## 2020-09-21 NOTE — Telephone Encounter (Signed)
Medication: atorvastatin (LIPITOR) 40 MG tablet [[id number]] , Rx #: [id number] lisinopril (ZESTRIL) 40 MG tablet [[id number]] , Rx #: [id number]  insulin detemir (LEVEMIR) 100 UNIT/ML FlexPen [[id number]]     Has the patient contacted their pharmacy? YES  (Agent: If no, request that the patient contact the pharmacy for the refill.) (Agent: If yes, when and what did the pharmacy advise?)  Preferred Pharmacy (with phone number or street name): [facility] [address] [place] [address] Phone: [phone] Fax: [phone] Hours: M-F 8:30a-5:30p    Agent: Please be advised that RX refills may take up to 3 business days. We ask that you follow-up with your pharmacy.

## 2020-10-12 ENCOUNTER — Telehealth: Payer: Self-pay | Admitting: Nurse Practitioner

## 2020-10-12 NOTE — Telephone Encounter (Signed)
Provider Zelda working virtual on 5/11. Call Pt and no answer. Left vm that appt has been changed to a virtual visit. If in person is preferred to call [phone] to reschedule.

## 2020-10-14 ENCOUNTER — Ambulatory Visit: Payer: Medicaid Other | Attending: Nurse Practitioner | Admitting: Nurse Practitioner

## 2020-10-14 ENCOUNTER — Telehealth: Payer: Self-pay | Admitting: Nurse Practitioner

## 2020-10-14 ENCOUNTER — Other Ambulatory Visit: Payer: Self-pay

## 2020-10-14 DIAGNOSIS — E1165 Type 2 diabetes mellitus with hyperglycemia: Secondary | ICD-10-CM

## 2020-10-14 DIAGNOSIS — I10 Essential (primary) hypertension: Secondary | ICD-10-CM | POA: Diagnosis not present

## 2020-10-14 DIAGNOSIS — J42 Unspecified chronic bronchitis: Secondary | ICD-10-CM

## 2020-10-14 DIAGNOSIS — Z794 Long term (current) use of insulin: Secondary | ICD-10-CM

## 2020-10-14 DIAGNOSIS — F17200 Nicotine dependence, unspecified, uncomplicated: Secondary | ICD-10-CM

## 2020-10-14 DIAGNOSIS — Z1231 Encounter for screening mammogram for malignant neoplasm of breast: Secondary | ICD-10-CM

## 2020-10-14 DIAGNOSIS — E782 Mixed hyperlipidemia: Secondary | ICD-10-CM

## 2020-10-14 DIAGNOSIS — E119 Type 2 diabetes mellitus without complications: Secondary | ICD-10-CM

## 2020-10-14 DIAGNOSIS — K219 Gastro-esophageal reflux disease without esophagitis: Secondary | ICD-10-CM

## 2020-10-14 MED ORDER — JANUMET 50-1000 MG PO TABS
1.0000 | ORAL_TABLET | Freq: Two times a day (BID) | ORAL | 0 refills | Status: DC
  Filled 2020-10-14 – 2020-10-15 (×2): qty 180, 90d supply, fill #0

## 2020-10-14 MED ORDER — BD PEN NEEDLE MINI U/F 31G X 5 MM MISC
3 refills | Status: DC
  Filled 2020-10-14: qty 100, 34d supply, fill #0

## 2020-10-14 MED ORDER — ALBUTEROL SULFATE HFA 108 (90 BASE) MCG/ACT IN AERS
INHALATION_SPRAY | RESPIRATORY_TRACT | 1 refills | Status: DC
  Filled 2020-10-14: qty 8.5, 25d supply, fill #0

## 2020-10-14 MED ORDER — BUDESONIDE-FORMOTEROL FUMARATE 160-4.5 MCG/ACT IN AERO
2.0000 | INHALATION_SPRAY | Freq: Two times a day (BID) | RESPIRATORY_TRACT | 3 refills | Status: DC
  Filled 2020-10-14: qty 10.2, 30d supply, fill #0

## 2020-10-14 MED ORDER — LISINOPRIL 40 MG PO TABS
40.0000 mg | ORAL_TABLET | Freq: Every day | ORAL | 0 refills | Status: DC
  Filled 2020-10-14 – 2020-12-22 (×2): qty 90, 90d supply, fill #0

## 2020-10-14 NOTE — Telephone Encounter (Signed)
Unable to reach. LVM will retry in 10-15min

## 2020-10-14 NOTE — Progress Notes (Signed)
Virtual Visit via Telephone Note Due to national recommendations of social distancing due to COVID 19, telehealth visit is felt to be most appropriate for this patient at this time.  I discussed the limitations, risks, security and privacy concerns of performing an evaluation and management service by telephone and the availability of in person appointments. I also discussed with the patient that there may be a patient responsible charge related to this service. The patient expressed understanding and agreed to proceed.    I connected with Robin Montgomery on 10/14/20  at   2:10 PM EDT  EDT by telephone and verified that I am speaking with the correct person using two identifiers.   Consent I discussed the limitations, risks, security and privacy concerns of performing an evaluation and management service by telephone and the availability of in person appointments. I also discussed with the patient that there may be a patient responsible charge related to this service. The patient expressed understanding and agreed to proceed.   Location of Patient: Private Residence   [facility] of Provider: [facility]    Persons participating in Telemedicine visit: Bertram Denver FNP-BC YY [place] CMA Robin Montgomery    History of Present Illness: Telemedicine visit for: Follow up She has a past medical history of Depression, Diabetes mellitus without complication (HCC), and Hypertension.  Unfortunately I have not seen Ms. Robin Montgomery since August 2021.  Today's visit was supposed to be an office visit however due to unforeseen circumstances with office scheduling she was seen as a tele visit today  Essential Hypertension Well-controlled with lisinopril 40 mg daily. Denies chest pain, shortness of breath, palpitations, lightheadedness, dizziness, headaches or BLE edema.  BP Readings from Last 3 Encounters:  04/23/20 105/78  01/08/20 (!) 163/92  12/03/19 (!) 156/82   DM  2 Poorly controlled.  She has been out of her Levemir 30 units twice daily and Janumet 50-1000 mg twice daily for quite some time.  LDL not at goal with atorvastatin 40 mg daily.  She has not been monitoring her blood glucose levels at home and is not diet or exercise adherent. Lab Results  Component Value Date   HGBA1C 10.5 (H) 11/18/2019   Lab Results  Component Value Date   LDLCALC 154 (H) 11/18/2019    COPD Requiring frequent use of her SABA inhaler.  We will try her on Symbicort today encouraged to stop smoking.   Past Medical History:  Diagnosis Date  . Depression   . Diabetes mellitus without complication (HCC)   . Hypertension     Past Surgical History:  Procedure Laterality Date  . CHOLECYSTECTOMY      No family history on file.  Social History   Socioeconomic History  . Marital status: Single    Spouse name: Not on file  . Number of children: Not on file  . Years of education: Not on file  . Highest education level: Not on file  Occupational History  . Not on file  Tobacco Use  . Smoking status: Current Every Day Smoker    Packs/day: 0.25    Types: Cigarettes  . Smokeless tobacco: Former Neurosurgeon    Types: Snuff  Vaping Use  . Vaping Use: Never used  Substance and Sexual Activity  . Alcohol use: Yes    Alcohol/week: 0.0 standard drinks    Comment: every other week   . Drug use: Yes    Types: Cocaine, Marijuana  . Sexual activity: Not on file  Other Topics Concern  . Not on file  Social History Narrative   Smoker   Cocaine use off and on for past 20 years   Living in motel   Living with 60 yo and 66-something year old child    Social Determinants of Health   Financial Resource Strain: Not on file  Food Insecurity: Not on file  Transportation Needs: Not on file  Physical Activity: Not on file  Stress: Not on file  Social Connections: Not on file     Observations/Objective: Awake, alert and oriented x 3   Review of Systems  Constitutional:  Negative for fever, malaise/fatigue and weight loss.  HENT: Negative.  Negative for nosebleeds.   Eyes: Negative.  Negative for blurred vision, double vision and photophobia.  Respiratory: Positive for cough and shortness of breath. Negative for wheezing.   Cardiovascular: Negative.  Negative for chest pain, palpitations and leg swelling.  Gastrointestinal: Positive for heartburn. Negative for nausea and vomiting.  Musculoskeletal: Negative.  Negative for myalgias.  Neurological: Negative.  Negative for dizziness, focal weakness, seizures and headaches.  Psychiatric/Behavioral: Positive for depression. Negative for suicidal ideas. The patient is nervous/anxious.     Assessment and Plan: Diagnoses and all orders for this visit:  Diabetes mellitus type 2, insulin dependent (HCC)  Essential hypertension -     lisinopril (ZESTRIL) 40 MG tablet; Take 1 tablet (40 mg total) by mouth daily. Continue all antihypertensives as prescribed.  Remember to bring in your blood pressure log with you for your follow up appointment.  DASH/Mediterranean Diets are healthier choices for HTN.   Breast cancer screening by mammogram -     MM 3D SCREEN BREAST BILATERAL; Future  Type 2 diabetes mellitus with hyperglycemia, with long-term current use of insulin (HCC) -     Ambulatory referral to Ophthalmology -     sitaGLIPtin-metformin (JANUMET) 50-1000 MG tablet; Take 1 tablet by mouth 2 (two) times daily with a meal. -     Discontinue: Insulin Pen Needle (B-D UF III MINI PEN NEEDLES) 31G X 5 MM MISC; use as instructed.inject into the skin once nightly -     insulin detemir (LEVEMIR FLEXTOUCH) 100 UNIT/ML FlexPen; Inject 30 Units into the skin 2 (two) times daily. -     glucose blood (TRUE METRIX BLOOD GLUCOSE TEST) test strip; Use as instructed -     Insulin Pen Needle (B-D UF III MINI PEN NEEDLES) 31G X 5 MM MISC; use as instructed.inject into the skin once nightly -     Accu-Chek Softclix Lancets lancets;  Use as instructed to check blood sugar twice daily. Continue blood sugar control as discussed in office today, low carbohydrate diet, and regular physical exercise as tolerated, 150 minutes per week (30 min each day, 5 days per week, or 50 min 3 days per week). Keep blood sugar logs with fasting goal of 90-130 mg/dl, post prandial (after you eat) less than 180.  For Hypoglycemia: BS <60 and Hyperglycemia BS >400; contact the clinic ASAP. Annual eye exams and foot exams are recommended.   Tobacco dependence -     budesonide-formoterol (SYMBICORT) 160-4.5 MCG/ACT inhaler; Inhale 2 puffs into the lungs 2 (two) times daily. -     albuterol (VENTOLIN HFA) 108 (90 Base) MCG/ACT inhaler; INHALE 1 PUFF INTO THE LUNGS EVERY 6 HOURS AS NEEDED FOR WHEEZING OR SHORTNESS OF BREATH.  Chronic bronchitis, unspecified chronic bronchitis type (HCC) -     budesonide-formoterol (SYMBICORT) 160-4.5 MCG/ACT inhaler; Inhale 2 puffs into  the lungs 2 (two) times daily. -     albuterol (VENTOLIN HFA) 108 (90 Base) MCG/ACT inhaler; INHALE 1 PUFF INTO THE LUNGS EVERY 6 HOURS AS NEEDED FOR WHEEZING OR SHORTNESS OF BREATH.  GERD without esophagitis -     omeprazole (PRILOSEC) 40 MG capsule; Take 1 capsule (40 mg total) by mouth daily. INSTRUCTIONS: Avoid GERD Triggers: acidic, spicy or fried foods, caffeine, coffee, sodas,  alcohol and chocolate.   Mixed hyperlipidemia -     atorvastatin (LIPITOR) 40 MG tablet; Take 1 tablet (40 mg total) by mouth daily. INSTRUCTIONS: Work on a low fat, heart healthy diet and participate in regular aerobic exercise program by working out at least 150 minutes per week; 5 days a week-30 minutes per day. Avoid red meat/beef/steak,  fried foods. junk foods, sodas, sugary drinks, unhealthy snacking, alcohol and smoking.  Drink at least 80 oz of water per day and monitor your carbohydrate intake daily.      Follow Up Instructions Return in about 3 months (around 01/14/2021).     I discussed  the assessment and treatment plan with the patient. The patient was provided an opportunity to ask questions and all were answered. The patient agreed with the plan and demonstrated an understanding of the instructions.   The patient was advised to call back or seek an in-person evaluation if the symptoms worsen or if the condition fails to improve as anticipated.  I provided 18 minutes of non-face-to-face time during this encounter including median intraservice time, reviewing previous notes, labs, imaging, medications and explaining diagnosis and management.  Claiborne Rigg, FNP-BC

## 2020-10-14 NOTE — Telephone Encounter (Signed)
S/w ms. Roediger.she requested I call her back in 30 minutes. I stated that I would try but could not guarantee due to my current schedule.

## 2020-10-15 ENCOUNTER — Other Ambulatory Visit: Payer: Self-pay

## 2020-10-15 ENCOUNTER — Telehealth: Payer: Self-pay

## 2020-10-15 NOTE — Telephone Encounter (Signed)
Janumet PA approved until 10/15/2021

## 2020-10-16 ENCOUNTER — Other Ambulatory Visit: Payer: Self-pay

## 2020-10-22 ENCOUNTER — Other Ambulatory Visit: Payer: Self-pay

## 2020-10-23 ENCOUNTER — Other Ambulatory Visit: Payer: Self-pay

## 2020-10-23 ENCOUNTER — Encounter: Payer: Self-pay | Admitting: Nurse Practitioner

## 2020-10-23 MED ORDER — ACCU-CHEK GUIDE VI STRP
ORAL_STRIP | 12 refills | Status: DC
  Filled 2020-10-23: qty 100, 50d supply, fill #0

## 2020-10-23 MED ORDER — ATORVASTATIN CALCIUM 40 MG PO TABS
40.0000 mg | ORAL_TABLET | Freq: Every day | ORAL | 3 refills | Status: DC
  Filled 2020-10-23: qty 90, 90d supply, fill #0

## 2020-10-23 MED ORDER — LEVEMIR FLEXTOUCH 100 UNIT/ML ~~LOC~~ SOPN
30.0000 [IU] | PEN_INJECTOR | Freq: Two times a day (BID) | SUBCUTANEOUS | 1 refills | Status: DC
  Filled 2020-10-23: qty 54, 90d supply, fill #0

## 2020-10-23 MED ORDER — TECHLITE PEN NEEDLES 31G X 5 MM MISC
3 refills | Status: DC
  Filled 2020-10-23 – 2020-12-22 (×2): qty 100, 90d supply, fill #0

## 2020-10-23 MED ORDER — OMEPRAZOLE 40 MG PO CPDR
40.0000 mg | DELAYED_RELEASE_CAPSULE | Freq: Every day | ORAL | 0 refills | Status: DC
  Filled 2020-10-23: qty 90, 90d supply, fill #0

## 2020-10-23 MED ORDER — ACCU-CHEK SOFTCLIX LANCETS MISC
6 refills | Status: DC
  Filled 2020-10-23: qty 100, 50d supply, fill #0

## 2020-10-26 ENCOUNTER — Other Ambulatory Visit: Payer: Self-pay

## 2020-10-30 ENCOUNTER — Other Ambulatory Visit: Payer: Self-pay

## 2020-11-03 ENCOUNTER — Other Ambulatory Visit: Payer: Self-pay

## 2020-11-19 ENCOUNTER — Other Ambulatory Visit: Payer: Self-pay

## 2020-11-19 MED ORDER — AMOXICILLIN 500 MG PO CAPS
500.0000 mg | ORAL_CAPSULE | Freq: Three times a day (TID) | ORAL | 0 refills | Status: DC
Start: 2020-11-19 — End: 2021-06-21
  Filled 2020-11-19 – 2020-12-22 (×2): qty 21, 7d supply, fill #0

## 2020-11-19 MED ORDER — TRAMADOL HCL 50 MG PO TABS
ORAL_TABLET | ORAL | 0 refills | Status: DC
  Filled 2020-11-19: qty 12, 2d supply, fill #0
  Filled 2020-12-22: qty 12, 3d supply, fill #0

## 2020-11-20 ENCOUNTER — Other Ambulatory Visit: Payer: Self-pay

## 2020-11-27 ENCOUNTER — Other Ambulatory Visit: Payer: Self-pay

## 2020-11-30 ENCOUNTER — Other Ambulatory Visit: Payer: Self-pay | Admitting: Nurse Practitioner

## 2020-11-30 DIAGNOSIS — E782 Mixed hyperlipidemia: Secondary | ICD-10-CM

## 2020-11-30 DIAGNOSIS — I10 Essential (primary) hypertension: Secondary | ICD-10-CM

## 2020-11-30 DIAGNOSIS — D649 Anemia, unspecified: Secondary | ICD-10-CM

## 2020-11-30 DIAGNOSIS — Z794 Long term (current) use of insulin: Secondary | ICD-10-CM
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
## 2020-12-22 ENCOUNTER — Other Ambulatory Visit: Payer: Self-pay

## 2020-12-23 ENCOUNTER — Other Ambulatory Visit: Payer: Self-pay

## 2020-12-23 ENCOUNTER — Ambulatory Visit: Payer: No Typology Code available for payment source | Attending: Nurse Practitioner

## 2020-12-23 DIAGNOSIS — Z794 Long term (current) use of insulin: Secondary | ICD-10-CM

## 2020-12-23 DIAGNOSIS — E119 Type 2 diabetes mellitus without complications: Secondary | ICD-10-CM

## 2020-12-23 DIAGNOSIS — E782 Mixed hyperlipidemia: Secondary | ICD-10-CM | POA: Diagnosis not present

## 2020-12-23 DIAGNOSIS — D649 Anemia, unspecified: Secondary | ICD-10-CM

## 2020-12-23 DIAGNOSIS — I10 Essential (primary) hypertension: Secondary | ICD-10-CM

## 2020-12-24 ENCOUNTER — Other Ambulatory Visit: Payer: Self-pay | Admitting: Nurse Practitioner

## 2020-12-24 DIAGNOSIS — E1165 Type 2 diabetes mellitus with hyperglycemia: Secondary | ICD-10-CM

## 2020-12-24 LAB — LIPID PANEL
Chol/HDL Ratio: 3.8 ratio (ref 0.0–4.4)
Cholesterol, Total: 225 mg/dL — ABNORMAL HIGH (ref 100–199)
HDL: 59 mg/dL (ref >39)
LDL Chol Calc (NIH): 150 mg/dL — ABNORMAL HIGH (ref 0–99)
Triglycerides: 90 mg/dL (ref 0–149)
VLDL Cholesterol Cal: 16 mg/dL (ref 5–40)

## 2020-12-24 LAB — CMP14+EGFR
ALT: 14 IU/L (ref 0–32)
AST: 11 IU/L (ref 0–40)
Albumin/Globulin Ratio: 1.6 (ref 1.2–2.2)
Albumin: 4.1 g/dL (ref 3.8–4.9)
Alkaline Phosphatase: 108 IU/L (ref 44–121)
BUN/Creatinine Ratio: 16 (ref 12–28)
BUN: 10 mg/dL (ref 8–27)
Bilirubin Total: <0.2 mg/dL (ref 0.0–1.2)
CO2: 22 mmol/L (ref 20–29)
Calcium: 9.2 mg/dL (ref 8.7–10.3)
Chloride: 104 mmol/L (ref 96–106)
Creatinine, Ser: 0.63 mg/dL (ref 0.57–1.00)
Globulin, Total: 2.5 g/dL (ref 1.5–4.5)
Glucose: 104 mg/dL — ABNORMAL HIGH (ref 65–99)
Potassium: 4.3 mmol/L (ref 3.5–5.2)
Sodium: 141 mmol/L (ref 134–144)
Total Protein: 6.6 g/dL (ref 6.0–8.5)
eGFR: 101 mL/min/{1.73_m2} (ref >59)

## 2020-12-24 LAB — CBC
Hematocrit: 39.8 % (ref 34.0–46.6)
Hemoglobin: 13.3 g/dL (ref 11.1–15.9)
MCH: 31.7 pg (ref 26.6–33.0)
MCHC: 33.4 g/dL (ref 31.5–35.7)
MCV: 95 fL (ref 79–97)
Platelets: 425 10*3/uL (ref 150–450)
RBC: 4.2 x10E6/uL (ref 3.77–5.28)
RDW: 13 % (ref 11.7–15.4)
WBC: 11.4 10*3/uL — ABNORMAL HIGH (ref 3.4–10.8)

## 2020-12-24 LAB — HEMOGLOBIN A1C
Est. average glucose Bld gHb Est-mCnc: 220 mg/dL
Hgb A1c MFr Bld: 9.3 % — ABNORMAL HIGH (ref 4.8–5.6)

## 2020-12-24 MED ORDER — TRULICITY 0.75 MG/0.5ML ~~LOC~~ SOAJ
0.7500 mg | SUBCUTANEOUS | 0 refills | Status: AC
  Filled 2020-12-24: qty 2, 28d supply, fill #0

## 2020-12-24 MED ORDER — LEVEMIR FLEXTOUCH 100 UNIT/ML ~~LOC~~ SOPN
30.0000 [IU] | PEN_INJECTOR | Freq: Two times a day (BID) | SUBCUTANEOUS | 1 refills | Status: DC
  Filled 2020-12-24: qty 54, 90d supply, fill #0

## 2020-12-24 MED ORDER — METFORMIN HCL 1000 MG PO TABS
1000.0000 mg | ORAL_TABLET | Freq: Two times a day (BID) | ORAL | 3 refills | Status: DC
  Filled 2020-12-24: qty 180, 90d supply, fill #0

## 2020-12-25 ENCOUNTER — Other Ambulatory Visit: Payer: Self-pay

## 2020-12-28 ENCOUNTER — Other Ambulatory Visit: Payer: Self-pay

## 2021-01-01 ENCOUNTER — Other Ambulatory Visit: Payer: Self-pay

## 2021-01-04 ENCOUNTER — Other Ambulatory Visit: Payer: Self-pay

## 2021-01-04 ENCOUNTER — Other Ambulatory Visit: Payer: Self-pay | Admitting: Nurse Practitioner

## 2021-01-04 DIAGNOSIS — D72829 Elevated white blood cell count, unspecified: Secondary | ICD-10-CM

## 2021-01-13 ENCOUNTER — Telehealth: Payer: Self-pay | Admitting: Nurse Practitioner

## 2021-01-13 NOTE — Telephone Encounter (Signed)
Ms.Ortega called me back while I was typing this encounter. She is scheduled with the  MM RNCM on 01/19/21.  Weston Settle Care Guide, High Risk Medicaid Managed Care Embedded Care Coordination [facility]  [facility]

## 2021-01-19 ENCOUNTER — Ambulatory Visit: Payer: Self-pay

## 2021-01-19 ENCOUNTER — Other Ambulatory Visit: Payer: Self-pay

## 2021-01-19 NOTE — Patient Outreach (Addendum)
Medicaid Managed Care   Nurse Care Manager Note  01/19/2021 Name:  Robin Montgomery MRN:  [id number] DOB:  [birth year]  Robin Montgomery is an 60 y.o. year old female who is a primary patient of Gildardo Pounds, NP.  [facility] Managed Care Coordination team was consulted for assistance with:    HTN HLD COPD DMI  Ms. Better was given information about Medicaid Managed Care Coordination team services today. Robin Montgomery Patient agreed to services and verbal consent obtained.  Engaged with patient by telephone for initial visit in response to provider referral for case management and/or care coordination services.   Assessments/Interventions:  Review of past medical history, allergies, medications, health status, including review of consultants reports, laboratory and other test data, was performed as part of comprehensive evaluation and provision of chronic care management services.  SDOH (Social Determinants of Health) assessments and interventions performed: SDOH Interventions    Flowsheet Row Most Recent Value  SDOH Interventions   SDOH Interventions for the Following Domains Depression, Financial Strain, Tobacco  Financial Strain Interventions Intervention Not Indicated  Tobacco Interventions Intervention Not Indicated  Depression Interventions/Treatment  Currently on Treatment       Care Plan  Allergies  Allergen Reactions   Aspirin Nausea And Vomiting    Upset stomach    Medications Reviewed Today     Reviewed by Inge Rise, RN (Case Manager) on 01/19/21 at [place] List Status: <None>   Medication Order Taking? Sig Documenting Provider Last Dose Status Informant  Accu-Chek Softclix Lancets lancets [id number]  Use as instructed to check blood sugar twice daily. Gildardo Pounds, NP  Active   acetaminophen (TYLENOL) 500 MG tablet [id number]  Take 1 tablet (500 mg total) by mouth every 6 (six) hours as needed.  Patient taking differently: Take 500 mg by  mouth every 6 (six) hours as needed for moderate pain.   Corena Herter, PA-C  Active Self  albuterol (VENTOLIN HFA) 108 (90 Base) MCG/ACT inhaler [id number]  INHALE 1 PUFF INTO THE LUNGS EVERY 6 HOURS AS NEEDED FOR WHEEZING OR SHORTNESS OF BREATH. Gildardo Pounds, NP  Active   amoxicillin (AMOXIL) 500 MG capsule [id number]  Take 1 capsule (500 mg total) by mouth 3 (three) times daily.   Active   ARIPiprazole, sensor, 10 MG TABS [id number]  Take 10 mg by mouth daily.  Patient not taking: Reported on 04/23/2020   Salley Slaughter, NP  Expired 01/07/21 2359   atorvastatin (LIPITOR) 40 MG tablet [id number]  Take 1 tablet (40 mg total) by mouth daily. Gildardo Pounds, NP  Active   Blood Glucose Monitoring Suppl (ACCU-CHEK GUIDE ME) w/Device KIT [id number]  Use to check blood sugar twice daily. Charlott Rakes, MD  Active Self  budesonide-formoterol [facility]) 160-4.5 MCG/ACT inhaler [id number]  Inhale 2 puffs into the lungs 2 (two) times daily. Gildardo Pounds, NP  Active   Deutetrabenazine (AUSTEDO) 12 MG TABS [id number]  Take 18 mg by mouth in the morning and at bedtime.  Patient not taking: Reported on 04/23/2020   Salley Slaughter, NP  Active   Dulaglutide (TRULICITY) 1.94 RD/4.0CX SOPN [id number]  Inject 0.75 mg into the skin once a week. Gildardo Pounds, NP  Active   glucose blood (ACCU-CHEK GUIDE) test strip [id number]  Use as instructed Gildardo Pounds, NP  Active   insulin detemir (LEVEMIR FLEXTOUCH) 100 UNIT/ML FlexPen [id number]  Inject 30 Units into the skin 2 (two) times daily. Raul Del,  Vernia Buff, NP  Active   Insulin Pen Needle (TECHLITE PEN NEEDLES) 31G X 5 MM MISC [id number]  use as instructed.inject into the skin once nightly Gildardo Pounds, NP  Active   lisinopril (ZESTRIL) 40 MG tablet [id number]  Take 1 tablet (40 mg total) by mouth daily. Gildardo Pounds, NP  Active   metFORMIN (GLUCOPHAGE) 1000 MG tablet [id number]  Take 1 tablet (1,000 mg total) by mouth 2 (two) times daily  with a meal.  Patient not taking: Reported on 01/19/2021   Gildardo Pounds, NP  Active   omeprazole (PRILOSEC) 40 MG capsule [id number]  Take 1 capsule (40 mg total) by mouth daily.  Patient not taking: Reported on 01/19/2021   Gildardo Pounds, NP  Active   traMADol (ULTRAM) 50 MG tablet [id number]  take 1 tablet (50 mg) by oral route every 4-6 hours as needed   Active   traZODone (DESYREL) 50 MG tablet [id number]  Take 0.5 tablets (25 mg total) by mouth at bedtime as needed for sleep. 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  Care Plan : RN Care Manager Plan of Care  Updates made by Inge Rise, RN since 01/19/2021 12:00 AM     Problem: Chronic Disease Management & Care Coordination Needs for HTN, DM II, COPD, HLD, Depression   Priority: High  Onset Date: 01/19/2021     Long-Range Goal: Development of Plan of Care for Chronic Disease Management and Care Coordination Needs   Start Date: 01/19/2021  Expected End Date: 05/19/2021  Priority: High  Note:   Current Barriers:  Knowledge Deficits related to plan of care for management of HTN, HLD, COPD, DMII. Care Coordination needs related to Financial constraints related to obtaining medications, Medication procurement,  and Literacy concerns  Chronic Disease Management support and education needs related to HTN, HLD, COPD, DMII. Film/video editor.  Literacy barriers Non-adherence to scheduled provider appointments Non-adherence to prescribed medication regimen Difficulty obtaining medications  RNCM Clinical Goal(s):  Patient will verbalize understanding of plan for management of HTN, HLD, COPD, DMII verbalize basic understanding of HTN, HLD, COPD, DMII disease process and self health management plan  take all medications exactly as prescribed and will call provider for medication related questions attend all scheduled medical appointments: No upcoming scheduled PCP appointment continue to work with RN Care Manager to address care management and care coordination needs related to HTN, HLD, COPD, DMII. work with pharmacist to address Financial constraints related to obtaining medications and Medication procurement related to HTN, HLD, COPD, DMII. work with Education officer, museum to address Financial constraints related to obtaining medications, Medication procurement, and Literacy concerns related to the management of HTN, HLD, COPD, DMII through collaboration with RN Care  manager, provider, and care team.   Interventions:   Inter-disciplinary care team collaboration (see longitudinal plan of care) Evaluation of current treatment plan related to  self management and patient's adherence to plan as established by provider   COPD: (Status: New goal.) Provided patient with basic written and verbal COPD education on self care/management/and exacerbation prevention; Advised patient to track and manage COPD triggers;  Provided instruction about proper use of medications used for management of COPD including inhalers. Patient does not have inhalers available to her due to financial difficulties obtaining numerous medications.  Plan to work on obtaining medications. Advised patient to self assesses COPD action plan zone  and make appointment with provider if in the yellow zone for 48 hours without improvement; Discussed the importance of adequate rest and management of fatigue with COPD; Assessed social determinant of health barriers;   Diabetes:  (Status: New goal.) Lab Results  Component Value Date   HGBA1C 9.3 (H) 12/23/2020  Assessed patient's understanding of A1c goal: <7% Provided education to patient about basic DM disease process; Reviewed medications with patient and discussed importance of medication adherence.  Patient not taking numerous medications ordered for DM II due to financial difficulties obtaining numerous medications.  Plan to work on obtaining medications.      Counseled on importance of regular laboratory monitoring as prescribed;        Discussed plans with patient for ongoing care management follow up and provided patient with direct contact information for care management team;      Reviewed scheduled/upcoming provider appointments including: No upcoming scheduled PCP appointments at this time;         Advised patient, providing education and rationale, to check cbg 3 times daily and record.  Patient does not have a glucometer currently and is in need of one.  Plan to work on obtaining one for patient.       Call provider for findings outside established parameters;       Referral made to pharmacy team for assistance with medication management for complex medication regime.;       Referral made to social work team for assistance with obtaining medications due to financial issues;      Review of patient status, including review of consultants reports, relevant laboratory and other test results, and medications completed;       Assessed social determinant of health barriers;         Hyperlipidemia:  (Status: New goal.) Lab Results  Component Value Date   CHOL 225 (H) 12/23/2020   HDL 59 12/23/2020   LDLCALC 150 (H) 12/23/2020   TRIG 90 12/23/2020   CHOLHDL 3.8 12/23/2020      Medication review performed; medication list updated in electronic medical record.  Counseled on importance of regular laboratory monitoring as prescribed; Provided HLD educational materials; Reviewed role and benefits of statin for ASCVD risk reduction; Reviewed importance of limiting foods high in cholesterol; Assessed social determinant of health barriers;   Hypertension: (Status: New goal.) Last practice recorded BP readings:  BP Readings from Last 3 Encounters:  04/23/20 105/78  01/08/20 (!) 163/92  12/03/19 (!) 156/82  Most recent eGFR/CrCl:  Lab Results  Component Value Date   EGFR 101 12/23/2020    No components found for: CRCL  Evaluation of current treatment plan related to hypertension self management and patient's adherence to plan as established by provider;   Provided education to patient re: stroke prevention, s/s of heart attack and stroke; Reviewed prescribed diet  of low sodium and low cholesterol Reviewed medications with patient and discussed importance of compliance;  Provided assistance with obtaining home blood pressure monitor via community resources.  Patient does not have blood pressure monitor currently and needs to obtain one.  Plan to work on getting patient a blood pressure monitor. Discussed plans with patient for ongoing care management follow up and provided patient with direct contact information for care management team;  Patient Goals/Self-Care Activities: Patient will self administer medications as prescribed Patient will attend all scheduled provider appointments Patient will call pharmacy for medication refills Patient will call provider office for new concerns or questions Patient will work with BSW to address care coordination needs and will continue to work with the clinical team to address health care and disease management related needs.         Follow Up:  Patient agrees to Care Plan and Follow-up.  Plan: The Managed Medicaid care  management team will reach out to the patient again over the next 30 days.  Date/time of next scheduled RN care management/care coordination outreach:  02/16/2021 at 2:00 pm  Salvatore Marvel RN, [facility] Mobile: [phone]

## 2021-01-19 NOTE — Patient Instructions (Signed)
Visit Information  Ms. Gin was given information about Medicaid Managed Care team care coordination services as a part of their [place] Medicaid benefit. [name] verbally consented to engagement with the [facility] Managed Care team.   If you are experiencing a medical emergency, please call 911 or report to your local emergency department or urgent care.   If you have a non-emergency medical problem during routine business hours, please contact your provider's office and ask to speak with a nurse.   For questions related to your [facility], please call: [phone] or visit the homepage here: [email or web address]  If you would like to schedule transportation through your [facility], please call the following number at least 2 days in advance of your appointment: [phone].   Call the [facility] at [phone], at any time, 24 hours a day, 7 days a week. If you are in danger or need immediate medical attention call 911.  If you would like help to quit smoking, call 1-800-QUIT-NOW 916-389-7143) OR Espaol: 1-855-Djelo-Ya (7-622-633-3545) o para ms informacin haga clic aqu or Text READY to 200-400 to register via text  Ms. Gudger - following are the goals we discussed in your visit today:   Goals Addressed             This Visit's Progress    HEMOGLOBIN A1C < 7.0       Timeframe:  Long-Range Goal Priority:  High Start Date:     01/19/2021                        Expected End Date:         Patient will self administer medications as prescribed Patient will attend all scheduled provider appointments Patient will call pharmacy for medication refills Patient will call provider office for new concerns or questions Patient will work with BSW to address care coordination needs and will continue to work with the clinical team  to address health care and disease management related needs.       RNCM Hypertension Monitored & Managed       Timeframe:  Long-Range Goal Priority:  High Start Date:                             Expected End Date:         Patient will self administer medications as prescribed Patient will attend all scheduled provider appointments Patient will call pharmacy for medication refills Patient will call provider office for new concerns or questions Patient will work with BSW to address care coordination needs and will continue to work with the clinical team to address health care and disease management related needs.          Please see education materials related to low cholesterol diet provided as print materials.   The patient verbalized understanding of instructions provided today and agreed to receive a mailed copy of patient instruction and/or educational materials.  The Managed Medicaid care management team will reach out to the patient again over the next 30 days.   Salvatore Marvel RN, BSN Community Care Coordinator [facility] Mobile: [phone]   Following is a copy of your plan of care:  Patient Care Plan: RN Care Manager Plan of Care     Problem Identified: Chronic Disease Management & Care Coordination Needs for HTN, DM  II, COPD, HLD, Depression   Priority: High  Onset Date: 01/19/2021     Long-Range Goal: Development of Plan of Care for Chronic Disease Management and Care Coordination Needs   Start Date: 01/19/2021  Expected End Date: 05/19/2021  Priority: High  Note:   Current Barriers:  Knowledge Deficits related to plan of care for management of HTN, HLD, COPD, DMII. Care Coordination needs related to Financial constraints related to obtaining medications, Medication procurement, and Literacy concerns  Chronic Disease Management support and education needs related to HTN, HLD, COPD, DMII. Film/video editor.  Literacy  barriers Non-adherence to scheduled provider appointments Non-adherence to prescribed medication regimen Difficulty obtaining medications  RNCM Clinical Goal(s):  Patient will verbalize understanding of plan for management of HTN, HLD, COPD, DMII verbalize basic understanding of HTN, HLD, COPD, DMII disease process and self health management plan  take all medications exactly as prescribed and will call provider for medication related questions attend all scheduled medical appointments: No upcoming scheduled PCP appointment continue to work with RN Care Manager to address care management and care coordination needs related to HTN, HLD, COPD, DMII. work with pharmacist to address Financial constraints related to obtaining medications and Medication procurement related to HTN, HLD, COPD, DMII. work with Education officer, museum to Civil Service fast streamer constraints related to obtaining medications, Medication procurement, and Literacy concerns related to the management of HTN, HLD, COPD, DMII through collaboration with Consulting civil engineer, provider, and care team.   Interventions:   Inter-disciplinary care team collaboration (see longitudinal plan of care) Evaluation of current treatment plan related to  self management and patient's adherence to plan as established by provider   COPD: (Status: New goal.) Provided patient with basic written and verbal COPD education on self care/management/and exacerbation prevention; Advised patient to track and manage COPD triggers;  Provided instruction about proper use of medications used for management of COPD including inhalers. Patient does not have inhalers available to her due to financial difficulties obtaining numerous medications.  Plan to work on obtaining medications. Advised patient to self assesses COPD action plan zone and make appointment with provider if in the yellow zone for 48 hours without improvement; Discussed the importance of adequate rest and  management of fatigue with COPD; Assessed social determinant of health barriers;   Diabetes:  (Status: New goal.) Lab Results  Component Value Date   HGBA1C 9.3 (H) 12/23/2020  Assessed patient's understanding of A1c goal: <7% Provided education to patient about basic DM disease process; Reviewed medications with patient and discussed importance of medication adherence.  Patient not taking numerous medications ordered for DM II due to financial difficulties obtaining numerous medications.  Plan to work on obtaining medications.      Counseled on importance of regular laboratory monitoring as prescribed;        Discussed plans with patient for ongoing care management follow up and provided patient with direct contact information for care management team;      Reviewed scheduled/upcoming provider appointments including: No upcoming scheduled PCP appointments at this time;         Advised patient, providing education and rationale, to check cbg 3 times daily and record.  Patient does not have a glucometer currently and is in need of one.  Plan to work on obtaining one for patient.       Call provider for findings outside established parameters;       Referral made to pharmacy team for assistance with medication management for complex medication regime.;  Referral made to social work team for assistance with obtaining medications due to financial issues;      Review of patient status, including review of consultants reports, relevant laboratory and other test results, and medications completed;       Assessed social determinant of health barriers;         Hyperlipidemia:  (Status: New goal.) Lab Results  Component Value Date   CHOL 225 (H) 12/23/2020   HDL 59 12/23/2020   LDLCALC 150 (H) 12/23/2020   TRIG 90 12/23/2020   CHOLHDL 3.8 12/23/2020     Medication review performed; medication list updated in electronic medical record.  Counseled on importance of regular laboratory  monitoring as prescribed; Provided HLD educational materials; Reviewed role and benefits of statin for ASCVD risk reduction; Reviewed importance of limiting foods high in cholesterol; Assessed social determinant of health barriers;   Hypertension: (Status: New goal.) Last practice recorded BP readings:  BP Readings from Last 3 Encounters:  04/23/20 105/78  01/08/20 (!) 163/92  12/03/19 (!) 156/82  Most recent eGFR/CrCl:  Lab Results  Component Value Date   EGFR 101 12/23/2020    No components found for: CRCL  Evaluation of current treatment plan related to hypertension self management and patient's adherence to plan as established by provider;   Provided education to patient re: stroke prevention, s/s of heart attack and stroke; Reviewed prescribed diet of low sodium and low cholesterol Reviewed medications with patient and discussed importance of compliance;  Provided assistance with obtaining home blood pressure monitor via community resources.  Patient does not have blood pressure monitor currently and needs to obtain one.  Plan to work on getting patient a blood pressure monitor. Discussed plans with patient for ongoing care management follow up and provided patient with direct contact information for care management team;  Patient Goals/Self-Care Activities: Patient will self administer medications as prescribed Patient will attend all scheduled provider appointments Patient will call pharmacy for medication refills Patient will call provider office for new concerns or questions Patient will work with BSW to address care coordination needs and will continue to work with the clinical team to address health care and disease management related needs.

## 2021-01-20 ENCOUNTER — Telehealth: Payer: Self-pay | Admitting: Nurse Practitioner

## 2021-01-20 NOTE — Telephone Encounter (Signed)
..   Medicaid Managed Care   Unsuccessful Outreach Note  01/20/2021 Name: Robin Montgomery MRN: [id number] DOB: 11-04-1960  Referred by: Claiborne Rigg, NP Reason for referral : High Risk Managed Medicaid (I reached out to Ms.Heacox today to get her scheduled for a phone visit with the MM Pharmacist. I left my name and number on her VM.)   An unsuccessful telephone outreach was attempted today. The patient was referred to the case management team for assistance with care management and care coordination.   Follow Up Plan: The care management team will reach out to the patient again over the next 7-14 days.   Weston Settle Care Guide, High Risk Medicaid Managed Care Embedded Care Coordination [facility]  [facility]

## 2021-01-20 NOTE — Telephone Encounter (Signed)
This encounter was created in error - please disregard.

## 2021-01-22 ENCOUNTER — Other Ambulatory Visit: Payer: Self-pay

## 2021-01-26 ENCOUNTER — Other Ambulatory Visit: Payer: Self-pay | Admitting: Nurse Practitioner

## 2021-01-26 DIAGNOSIS — E1165 Type 2 diabetes mellitus with hyperglycemia: Secondary | ICD-10-CM

## 2021-01-26 MED ORDER — OFFSET CANE MISC: 0 refills | Status: DC

## 2021-01-26 MED ORDER — ACCU-CHEK SOFTCLIX LANCETS MISC: 6 refills | Status: DC

## 2021-01-26 MED ORDER — ACCU-CHEK GUIDE ME W/DEVICE KIT: PACK | 0 refills | Status: DC

## 2021-01-26 MED ORDER — BLOOD PRESSURE MONITOR DEVI: 0 refills | Status: DC

## 2021-01-26 MED ORDER — ACCU-CHEK GUIDE VI STRP: ORAL_STRIP | 12 refills | Status: DC

## 2021-01-28 ENCOUNTER — Other Ambulatory Visit: Payer: Self-pay

## 2021-01-28 ENCOUNTER — Other Ambulatory Visit: Payer: Self-pay | Admitting: Nurse Practitioner

## 2021-01-28 DIAGNOSIS — J42 Unspecified chronic bronchitis: Secondary | ICD-10-CM

## 2021-01-28 DIAGNOSIS — K219 Gastro-esophageal reflux disease without esophagitis: Secondary | ICD-10-CM

## 2021-01-28 DIAGNOSIS — E1165 Type 2 diabetes mellitus with hyperglycemia: Secondary | ICD-10-CM

## 2021-01-28 DIAGNOSIS — F332 Major depressive disorder, recurrent severe without psychotic features: Secondary | ICD-10-CM

## 2021-01-28 DIAGNOSIS — F17200 Nicotine dependence, unspecified, uncomplicated: Secondary | ICD-10-CM

## 2021-01-28 DIAGNOSIS — E782 Mixed hyperlipidemia: Secondary | ICD-10-CM

## 2021-01-28 DIAGNOSIS — I10 Essential (primary) hypertension: Secondary | ICD-10-CM

## 2021-01-28 MED ORDER — ATORVASTATIN CALCIUM 40 MG PO TABS: 40.0000 mg | ORAL_TABLET | Freq: Every day | ORAL | 3 refills | Status: DC

## 2021-01-28 MED ORDER — TRAZODONE HCL 50 MG PO TABS: 25.0000 mg | ORAL_TABLET | Freq: Every evening | ORAL | 2 refills | Status: DC | PRN

## 2021-01-28 MED ORDER — ALBUTEROL SULFATE HFA 108 (90 BASE) MCG/ACT IN AERS: INHALATION_SPRAY | RESPIRATORY_TRACT | 1 refills | Status: DC

## 2021-01-28 MED ORDER — LEVEMIR FLEXTOUCH 100 UNIT/ML ~~LOC~~ SOPN: 30.0000 [IU] | PEN_INJECTOR | Freq: Two times a day (BID) | SUBCUTANEOUS | 1 refills | Status: DC

## 2021-01-28 MED ORDER — METFORMIN HCL ER 500 MG PO TB24: 500.0000 mg | ORAL_TABLET | Freq: Every day | ORAL | 0 refills | Status: DC

## 2021-01-28 MED ORDER — ACCU-CHEK SOFTCLIX LANCETS MISC: 6 refills | Status: DC

## 2021-01-28 MED ORDER — LISINOPRIL 40 MG PO TABS: 40.0000 mg | ORAL_TABLET | Freq: Every day | ORAL | 0 refills | Status: DC

## 2021-01-28 MED ORDER — TECHLITE PEN NEEDLES 31G X 5 MM MISC: 3 refills | Status: DC

## 2021-01-28 MED ORDER — OMEPRAZOLE 40 MG PO CPDR: 40.0000 mg | DELAYED_RELEASE_CAPSULE | Freq: Every day | ORAL | 0 refills | Status: DC

## 2021-01-28 MED ORDER — ACCU-CHEK GUIDE VI STRP: ORAL_STRIP | 12 refills | Status: DC

## 2021-01-28 MED ORDER — BUDESONIDE-FORMOTEROL FUMARATE 160-4.5 MCG/ACT IN AERO: 2.0000 | INHALATION_SPRAY | Freq: Two times a day (BID) | RESPIRATORY_TRACT | 3 refills | Status: DC

## 2021-01-28 NOTE — Patient Outreach (Signed)
Medicaid Managed Care    Pharmacy Note  01/28/2021 Name: Robin Montgomery MRN: [id number] DOB: Nov 19, 1960  Robin Montgomery is a 60 y.o. year old female who is a primary care patient of Gildardo Pounds, NP. The [facility] Managed Care Coordination team was consulted for assistance with disease management and care coordination needs.    Engaged with patient Engaged with patient by telephone for initial visit in response to referral for case management and/or care coordination services.  Robin Montgomery was given information about Managed Medicaid Care Coordination team services today. Robin Montgomery agreed to services and verbal consent obtained.   Objective:  Lab Results  Component Value Date   CREATININE 0.63 12/23/2020   CREATININE 0.51 12/12/2019   CREATININE 0.53 (L) 11/18/2019    Lab Results  Component Value Date   HGBA1C 9.3 (H) 12/23/2020       Component Value Date/Time   CHOL 225 (H) 12/23/2020 0916   TRIG 90 12/23/2020 0916   HDL 59 12/23/2020 0916   CHOLHDL 3.8 12/23/2020 0916   CHOLHDL 3.8 03/19/2014 1930   VLDL 40 03/19/2014 1930   LDLCALC 150 (H) 12/23/2020 0916    Other: (TSH, CBC, Vit D, etc.)  Clinical ASCVD: Yes  The 10-year ASCVD risk score Robin Bussing DC Jr., et al., 2013) is: 18.2%   Values used to calculate the score:     Age: 39 years     Sex: Female     Is Non-Hispanic African American: Yes     Diabetic: Yes     Tobacco smoker: Yes     Systolic Blood Pressure: 672 mmHg     Is BP treated: Yes     HDL Cholesterol: 59 mg/dL     Total Cholesterol: 225 mg/dL    Other: (CHADS2VASc if Afib, PHQ9 if depression, MMRC or CAT for COPD, ACT, DEXA)  BP Readings from Last 3 Encounters:  04/23/20 105/78  01/08/20 (!) 163/92  12/03/19 (!) 156/82    Assessment/Interventions: Review of patient past medical history, allergies, medications, health status, including review of consultants reports, laboratory and other test data, was performed as part of  comprehensive evaluation and provision of chronic care management services.   HTN Lisinopril Plan: At goal,  patient stable/ symptoms controlled   Lipids Lab Results  Component Value Date   CHOL 225 (H) 12/23/2020   CHOL 263 (H) 11/18/2019   CHOL 273 (H) 10/02/2018   Lab Results  Component Value Date   HDL 59 12/23/2020   HDL 61 11/18/2019   HDL 70 10/02/2018   Lab Results  Component Value Date   LDLCALC 150 (H) 12/23/2020   LDLCALC 154 (H) 11/18/2019   LDLCALC 173 (H) 10/02/2018   Lab Results  Component Value Date   TRIG 90 12/23/2020   TRIG 265 (H) 11/18/2019   TRIG 151 (H) 10/02/2018   Lab Results  Component Value Date   CHOLHDL 3.8 12/23/2020   CHOLHDL 4.3 11/18/2019   CHOLHDL 3.9 10/02/2018   No results found for: LDLDIRECT Atorvastatin 63m Plan: At goal,  patient stable/ symptoms controlled   DM Lab Results  Component Value Date   HGBA1C 9.3 (H) 12/23/2020   HGBA1C 10.5 (H) 11/18/2019   HGBA1C 11.8 (H) 10/02/2018   Lab Results  Component Value Date   MICROALBUR 0.6 11/17/2015   LDLCALC 150 (H) 12/23/2020   CREATININE 0.63 12/23/2020    Lab Results  Component Value Date   NA 141 12/23/2020   K 4.3 12/23/2020  CREATININE 0.63 12/23/2020   GFRNONAA >60 12/12/2019   GFRAA >60 12/12/2019   GLUCOSE 104 (H) 12/23/2020    Lab Results  Component Value Date   WBC 11.4 (H) 12/23/2020   HGB 13.3 12/23/2020   HCT 39.8 12/23/2020   MCV 95 12/23/2020   PLT 425 12/23/2020   Detemir 30 units BID Metformin IR (NON COMPLIANT) August 2022: Non-compliant on Metformin IR due to ADR's, patient open to trying ER. She told me, "I don't take Metformin because it bothered my stomach and I heard on TV it's back for black people. Spent extensive time counseling on this  COPD No flowsheet data found.  Symbicort Albuterol Plan: At goal,  patient stable/ symptoms controlled   TD -Managed by Robin Montgomery 80m Plan: At goal,   patient stable/ symptoms controlled  MDD -Managed by Robin CannerDepression screen [facility]/9 01/19/2021 01/08/2020 12/13/2019  Decreased Interest - 1 3  Down, Depressed, Hopeless 1 0 3  PHQ - 2 Score '1 1 6  ' Altered sleeping - 0 3  Tired, decreased energy - 1 3  Change in appetite - 2 2  Feeling bad or failure about yourself  - 0 3  Trouble concentrating - 1 3  Moving slowly or fidgety/restless - 0 3  Suicidal thoughts - 0 3  PHQ-9 Score - 5 26  Difficult doing work/chores - - Extremely dIfficult  Some recent data might be hidden   Aripiprazole 13mTrazodone 2578mS Plan: At goal,  patient stable/ symptoms controlled  SDOH (Social Determinants of Health) assessments and interventions performed:    Care Plan  Allergies  Allergen Reactions   Aspirin Nausea And Vomiting    Upset stomach    Medications Reviewed Today     Reviewed by Robin Montgomery (Case Manager) on 01/19/21 at [address] Status: <None>   Medication Order Taking? Sig Documenting Provider Last Dose Status Informant  Accu-Chek Softclix Lancets lancets [id number] as instructed to check blood sugar twice daily. FleGildardo PoundsP  Active   acetaminophen (TYLENOL) 500 MG tablet [id number] 1 tablet (500 mg total) by mouth every 6 (six) hours as needed.  Patient taking differently: Take 500 mg by mouth every 6 (six) hours as needed for moderate pain.   GreCorena HerterA-C  Active Self  albuterol (VENTOLIN HFA) 108 (90 Base) MCG/ACT inhaler [id number] 1 PUFF INTO THE LUNGS EVERY 6 HOURS AS NEEDED FOR WHEEZING OR SHORTNESS OF BREATH. FleGildardo PoundsP  Active   amoxicillin (AMOXIL) 500 MG capsule [id number] 1 capsule (500 mg total) by mouth 3 (three) times daily.   Active   ARIPiprazole, sensor, 10 MG TABS [id number] 10 mg by mouth daily.  Patient not taking: Reported on 04/23/2020   ParSalley SlaughterP  Expired 01/07/21 2359   atorvastatin (LIPITOR) 40 MG tablet  [id number] 1 tablet (40 mg total) by mouth daily. FleGildardo PoundsP  Active   Blood Glucose Monitoring Suppl (ACCU-CHEK GUIDE ME) w/Device KIT [id number] to check blood sugar twice daily. NewCharlott RakesD  Active Self  budesonide-formoterol ([facility]60-4.5 MCG/ACT inhaler [id number] 2 puffs into the lungs 2 (two) times daily. FleGildardo PoundsP  Active   Montgomery (A[place]EDO) 12 MG TABS [id number] 18 mg by mouth in the morning and at bedtime.  Patient not taking: Reported on 04/23/2020   ParEulis Montgomery  E, NP  Active   Dulaglutide (TRULICITY) 6.37 CH/8.8FO SOPN [id number]  Inject 0.75 mg into the skin once a week. Gildardo Pounds, NP  Active   glucose blood (ACCU-CHEK GUIDE) test strip [id number]  Use as instructed Gildardo Pounds, NP  Active   insulin detemir (LEVEMIR FLEXTOUCH) 100 UNIT/ML FlexPen [id number]  Inject 30 Units into the skin 2 (two) times daily. Gildardo Pounds, NP  Active   Insulin Pen Needle (TECHLITE PEN NEEDLES) 31G X 5 MM MISC [id number]  use as instructed.inject into the skin once nightly Gildardo Pounds, NP  Active   lisinopril (ZESTRIL) 40 MG tablet [id number]  Take 1 tablet (40 mg total) by mouth daily. Gildardo Pounds, NP  Active   metFORMIN (GLUCOPHAGE) 1000 MG tablet [id number]  Take 1 tablet (1,000 mg total) by mouth 2 (two) times daily with a meal.  Patient not taking: Reported on 01/19/2021   Gildardo Pounds, NP  Active   omeprazole (PRILOSEC) 40 MG capsule [id number]  Take 1 capsule (40 mg total) by mouth daily.  Patient not taking: Reported on 01/19/2021   Gildardo Pounds, NP  Active   traMADol (ULTRAM) 50 MG tablet [id number]  take 1 tablet (50 mg) by oral route every 4-6 hours as needed   Active   traZODone (DESYREL) 50 MG tablet [id number]  Take 0.5 tablets (25 mg total) by mouth at bedtime as needed for sleep. Salley Slaughter, NP  Active             Patient Active Problem List   Diagnosis Date Noted   Left medial  knee pain 07/31/2016   Essential hypertension 11/17/2015   Alcohol abuse    MDD (major depressive disorder), recurrent severe, without psychosis ([place]) 02/12/2015   Substance induced mood disorder ([place]) 02/12/2015   Polysubstance abuse ([place])    Wrist pain, chronic 12/22/2014   Trochanteric bursitis of left hip 12/22/2014   Seasonal allergies 09/12/2014   Tobacco dependence 09/12/2014   Shortness of breath 09/12/2014   Diabetes mellitus type 2, insulin dependent ([place]) 04/01/2014   Pain due to onychomycosis of toenail 04/01/2014   Cocaine abuse ([place]) 04/01/2014    Conditions to be addressed/monitored: HTN and DM  Care Plan : Medication Management  Updates made by Lane Hacker, [place] since 01/28/2021 12:00 AM     Problem: Health Promotion or Disease Self-Management (General Plan of Care)      Goal: Medication management   Note:   Current Barriers:  Unable to independently afford treatment regimen Does not maintain contact with provider office Does not contact provider office for questions/concerns   Pharmacist Clinical Goal(s):  Over the next 90 days, patient will achieve adherence to monitoring guidelines and medication adherence to achieve therapeutic efficacy contact provider office for questions/concerns as evidenced notation of same in electronic health record through collaboration with PharmD and provider.    Interventions: Inter-disciplinary care team collaboration (see longitudinal plan of care) Comprehensive medication review performed; medication list updated in electronic medical record      Patient Goals/Self-Care Activities Over the next 90 days, patient will:  - take medications as prescribed collaborate with provider on medication access solutions  Follow Up Plan: The patient has been provided with contact information for the care management team and has been advised to call with any health related questions or concerns.      Task: Mutually Develop  and Royce Macadamia Achievement of Patient Goals   Note:   Care Management  Activities:    - verbalization of feelings encouraged    Notes:        Medication Assistance:  Patient non-compliant, requesting delivery of meds and packaging Plan: Verbal consent obtained for [facility] enhanced pharmacy services (medication synchronization, adherence packaging, delivery coordination). A medication sync plan was created to allow patient to get all medications delivered once every 30 to 90 days per patient preference. Patient understands they have freedom to choose pharmacy and clinical pharmacist will coordinate care between all prescribers and [facility].  Medication Name                        (please note if Rx is PRN) Prescriber                                                                  (list Provider Name & Phone Number)                                  Timing    Refill Timing Last Fill Date & DS       (if last fill/DS unavailable, list pt.'s quantity on hand) Anticipated next due date    BB B L EM BT     TS/Lancets (Softclix and Guide) [name] [phone]       09/21/20 for 90 days 02/25/21  Albuterol puffer [name] [phone]       Never Filled 02/01/21  Atorvastatin 46m ZGeryl [phone] 1     09/21/20 for 90 days 02/25/21  Symbicort 160-4.5 ZGeryl [phone]      Never Filled 02/01/21  Austedo 132mTabs take 1855mID BriBurt Eksych (Unknown number) 1.5  1.5   Never Filled 02/01/21  Detemir 30 units BID ZelGeryl [phone]    09/21/20 for 90 days 02/25/21  Pen needles 31gx5 ZelGeryl [phone]    12/23/20 for 90 days 03/23/21  Lisinopril 30m67mldGeryl Rankins-[phone]    12/23/20 for 90 days 03/23/21  Metformin ER (I don't know the dose she'll start on. Hopefully it's 1QD so please do that with EM) ZeldGeryl Rankins-[phone]   Never Filled 02/01/21  Omeprazole 30mg36mLS ZeldaGeryl [phone]  Never  Filled 02/25/21  Trazodone 25mg 94mrittaBurt Ekh (Unknown number)    1  Never Filled 02/01/21  Aripiprazole 10mg B34manBurt Ek (Unknown number)      Never Filled 02/01/21    Follow up: Agree   Plan: The patient has been provided with contact information for the care management team and has been advised to call with any health related questions or concerns.   Verba Ainley [name].D., Managed Medicaid Pharmacist - [phone]

## 2021-01-28 NOTE — Patient Instructions (Signed)
Visit Information  Robin Montgomery was given information about Medicaid Managed Care team care coordination services as a part of their [place] Medicaid benefit. [name] verbally consented to engagement with the [facility] Managed Care team.   If you are experiencing a medical emergency, please call 911 or report to your local emergency department or urgent care.   If you have a non-emergency medical problem during routine business hours, please contact your provider's office and ask to speak with a nurse.   For questions related to your [facility], please call: [phone] or visit the homepage here: [email or web address]  If you would like to schedule transportation through your [facility], please call the following number at least 2 days in advance of your appointment: [phone].   Call the [facility] at [phone], at any time, 24 hours a day, 7 days a week. If you are in danger or need immediate medical attention call 911.  If you would like help to quit smoking, call 1-800-QUIT-NOW 205 215 3689) OR Espaol: 1-855-Djelo-Ya (3-729-021-1155) o para ms informacin haga clic aqu or Text READY to 200-400 to register via text  [name] - following are the goals we discussed in your visit today:   Goals Addressed   None     Please see education materials related to DM provided as print materials.   The patient verbalized understanding of instructions provided today and declined a print copy of patient instruction materials.   The Managed Medicaid care management team will reach out to the patient again over the next 90 days.   [name], Pharm.D., Managed Medicaid Pharmacist [phone]   Following is a copy of your plan of care:  Patient Care Plan: RN Care Manager Plan of Care     Problem Identified: Chronic  Disease Management & Care Coordination Needs for HTN, DM II, COPD, HLD, Depression   Priority: High  Onset Date: 01/19/2021     Long-Range Goal: Development of Plan of Care for Chronic Disease Management and Care Coordination Needs   Start Date: 01/19/2021  Expected End Date: 05/19/2021  Priority: High  Note:   Current Barriers:  Knowledge Deficits related to plan of care for management of HTN, HLD, COPD, DMII. Care Coordination needs related to Financial constraints related to obtaining medications, Medication procurement, and Literacy concerns  Chronic Disease Management support and education needs related to HTN, HLD, COPD, DMII. Film/video editor.  Literacy barriers Non-adherence to scheduled provider appointments Non-adherence to prescribed medication regimen Difficulty obtaining medications  RNCM Clinical Goal(s):  Patient will verbalize understanding of plan for management of HTN, HLD, COPD, DMII verbalize basic understanding of HTN, HLD, COPD, DMII disease process and self health management plan  take all medications exactly as prescribed and will call provider for medication related questions attend all scheduled medical appointments: No upcoming scheduled PCP appointment continue to work with RN Care Manager to address care management and care coordination needs related to HTN, HLD, COPD, DMII. work with pharmacist to address Financial constraints related to obtaining medications and Medication procurement related to HTN, HLD, COPD, DMII. work with Education officer, museum to address Financial constraints related to obtaining medications, Medication procurement, and Literacy concerns related to the management of HTN, HLD, COPD, DMII through collaboration with Consulting civil engineer, provider, and care team.   Interventions: Inter-disciplinary care team collaboration (see longitudinal plan of care) Evaluation of current treatment plan related to  self management and patient's adherence  to  plan as established by provider   COPD: (Status: New goal.) Provided patient with basic written and verbal COPD education on self care/management/and exacerbation prevention; Advised patient to track and manage COPD triggers;  Provided instruction about proper use of medications used for management of COPD including inhalers. Patient does not have inhalers available to her due to financial difficulties obtaining numerous medications.  Plan to work on obtaining medications. Advised patient to self assesses COPD action plan zone and make appointment with provider if in the yellow zone for 48 hours without improvement; Discussed the importance of adequate rest and management of fatigue with COPD; Assessed social determinant of health barriers;   Diabetes:  (Status: New goal.) Lab Results  Component Value Date   HGBA1C 9.3 (H) 12/23/2020  Assessed patient's understanding of A1c goal: <7% Provided education to patient about basic DM disease process; Reviewed medications with patient and discussed importance of medication adherence.  Patient not taking numerous medications ordered for DM II due to financial difficulties obtaining numerous medications.  Plan to work on obtaining medications.      Counseled on importance of regular laboratory monitoring as prescribed;        Discussed plans with patient for ongoing care management follow up and provided patient with direct contact information for care management team;      Reviewed scheduled/upcoming provider appointments including: No upcoming scheduled PCP appointments at this time;         Advised patient, providing education and rationale, to check cbg 3 times daily and record.  Patient does not have a glucometer currently and is in need of one.  Plan to work on obtaining one for patient.       Call provider for findings outside established parameters;       Referral made to pharmacy team for assistance with medication management for complex  medication regime.;       Referral made to social work team for assistance with obtaining medications due to financial issues;      Review of patient status, including review of consultants reports, relevant laboratory and other test results, and medications completed;       Assessed social determinant of health barriers;         Hyperlipidemia:  (Status: New goal.) Lab Results  Component Value Date   CHOL 225 (H) 12/23/2020   HDL 59 12/23/2020   LDLCALC 150 (H) 12/23/2020   TRIG 90 12/23/2020   CHOLHDL 3.8 12/23/2020     Medication review performed; medication list updated in electronic medical record.  Counseled on importance of regular laboratory monitoring as prescribed; Provided HLD educational materials; Reviewed role and benefits of statin for ASCVD risk reduction; Reviewed importance of limiting foods high in cholesterol; Assessed social determinant of health barriers;   Hypertension: (Status: New goal.) Last practice recorded BP readings:  BP Readings from Last 3 Encounters:  04/23/20 105/78  01/08/20 (!) 163/92  12/03/19 (!) 156/82  Most recent eGFR/CrCl:  Lab Results  Component Value Date   EGFR 101 12/23/2020    No components found for: CRCL  Evaluation of current treatment plan related to hypertension self management and patient's adherence to plan as established by provider;   Provided education to patient re: stroke prevention, s/s of heart attack and stroke; Reviewed prescribed diet of low sodium and low cholesterol Reviewed medications with patient and discussed importance of compliance;  Provided assistance with obtaining home blood pressure monitor via community resources.  Patient does not have  blood pressure monitor currently and needs to obtain one.  Plan to work on getting patient a blood pressure monitor. Discussed plans with patient for ongoing care management follow up and provided patient with direct contact information for care management  team;  Patient Goals/Self-Care Activities: Patient will self administer medications as prescribed Patient will attend all scheduled provider appointments Patient will call pharmacy for medication refills Patient will call provider office for new concerns or questions Patient will work with BSW to address care coordination needs and will continue to work with the clinical team to address health care and disease management related needs.        Patient Care Plan: Medication Management     Problem Identified: Health Promotion or Disease Self-Management (General Plan of Care)      Goal: Medication management   Note:   Current Barriers:  Unable to independently afford treatment regimen Does not maintain contact with provider office Does not contact provider office for questions/concerns   Pharmacist Clinical Goal(s):  Over the next 90 days, patient will achieve adherence to monitoring guidelines and medication adherence to achieve therapeutic efficacy contact provider office for questions/concerns as evidenced notation of same in electronic health record through collaboration with PharmD and provider.    Interventions: Inter-disciplinary care team collaboration (see longitudinal plan of care) Comprehensive medication review performed; medication list updated in electronic medical record      Patient Goals/Self-Care Activities Over the next 90 days, patient will:  - take medications as prescribed collaborate with provider on medication access solutions  Follow Up Plan: The patient has been provided with contact information for the care management team and has been advised to call with any health related questions or concerns.      Task: Mutually Develop and Royce Macadamia Achievement of Patient Goals   Note:   Care Management Activities:    - verbalization of feelings encouraged    Notes:

## 2021-01-29 ENCOUNTER — Other Ambulatory Visit (HOSPITAL_COMMUNITY): Payer: Self-pay | Admitting: Psychiatry

## 2021-01-29 DIAGNOSIS — F332 Major depressive disorder, recurrent severe without psychotic features: Secondary | ICD-10-CM

## 2021-01-29 DIAGNOSIS — G2401 Drug induced subacute dyskinesia: Secondary | ICD-10-CM

## 2021-01-29 MED ORDER — TRAZODONE HCL 50 MG PO TABS: 25.0000 mg | ORAL_TABLET | Freq: Every evening | ORAL | 2 refills | Status: DC | PRN

## 2021-01-29 MED ORDER — AUSTEDO 12 MG PO TABS: 18.0000 mg | ORAL_TABLET | Freq: Two times a day (BID) | ORAL | 2 refills | Status: DC

## 2021-01-29 MED ORDER — ARIPIPRAZOLE (SENSOR) 10 MG PO TABS: 10.0000 mg | ORAL_TABLET | Freq: Every day | ORAL | 2 refills | Status: DC

## 2021-02-01 ENCOUNTER — Other Ambulatory Visit (HOSPITAL_COMMUNITY): Payer: Self-pay | Admitting: Psychiatry

## 2021-02-01 DIAGNOSIS — F1994 Other psychoactive substance use, unspecified with psychoactive substance-induced mood disorder: Secondary | ICD-10-CM

## 2021-02-01 MED ORDER — ARIPIPRAZOLE 10 MG PO TABS: 10.0000 mg | ORAL_TABLET | Freq: Every day | ORAL | 2 refills | Status: DC

## 2021-02-01 NOTE — Patient Outreach (Signed)
Aripiprazole bluetooth med isn't covered by insurance, coordinated with Doyne Keel and [employer] to determine what we can get covered.

## 2021-02-10 ENCOUNTER — Encounter (HOSPITAL_COMMUNITY): Payer: Self-pay | Admitting: Emergency Medicine

## 2021-02-10 ENCOUNTER — Emergency Department (HOSPITAL_COMMUNITY)
Admission: EM | Admit: 2021-02-10 | Discharge: 2021-02-10 | Disposition: A | Discharge status: Home / Self Care | Payer: Medicaid Other | Attending: Emergency Medicine | Admitting: Emergency Medicine

## 2021-02-10 ENCOUNTER — Other Ambulatory Visit: Payer: Self-pay

## 2021-02-10 DIAGNOSIS — R1084 Generalized abdominal pain: Secondary | ICD-10-CM

## 2021-02-10 DIAGNOSIS — R109 Unspecified abdominal pain: Secondary | ICD-10-CM | POA: Diagnosis present

## 2021-02-10 DIAGNOSIS — R000 Tachycardia, unspecified: Secondary | ICD-10-CM | POA: Diagnosis not present

## 2021-02-10 DIAGNOSIS — N898 Other specified noninflammatory disorders of vagina: Secondary | ICD-10-CM | POA: Diagnosis not present

## 2021-02-10 DIAGNOSIS — R309 Painful micturition, unspecified: Secondary | ICD-10-CM | POA: Insufficient documentation

## 2021-02-10 DIAGNOSIS — R112 Nausea with vomiting, unspecified: Secondary | ICD-10-CM | POA: Insufficient documentation

## 2021-02-10 DIAGNOSIS — R7309 Other abnormal glucose: Secondary | ICD-10-CM | POA: Insufficient documentation

## 2021-02-10 DIAGNOSIS — Z5321 Procedure and treatment not carried out due to patient leaving prior to being seen by health care provider: Secondary | ICD-10-CM | POA: Insufficient documentation

## 2021-02-10 LAB — COMPREHENSIVE METABOLIC PANEL
ALT: 48 U/L — ABNORMAL HIGH (ref 0–44)
AST: 25 U/L (ref 15–41)
Albumin: 3.9 g/dL (ref 3.5–5.0)
Alkaline Phosphatase: 90 U/L (ref 38–126)
Anion gap: 12 (ref 5–15)
BUN: 6 mg/dL (ref 6–20)
CO2: 24 mmol/L (ref 22–32)
Calcium: 9.3 mg/dL (ref 8.9–10.3)
Chloride: 99 mmol/L (ref 98–111)
Creatinine, Ser: 0.6 mg/dL (ref 0.44–1.00)
GFR, Estimated: >60 mL/min (ref >60)
Glucose, Bld: 326 mg/dL — ABNORMAL HIGH (ref 70–99)
Potassium: 3.2 mmol/L — ABNORMAL LOW (ref 3.5–5.1)
Sodium: 135 mmol/L (ref 135–145)
Total Bilirubin: 0.6 mg/dL (ref 0.3–1.2)
Total Protein: 7.5 g/dL (ref 6.5–8.1)

## 2021-02-10 LAB — CBC
HCT: 41.5 % (ref 36.0–46.0)
Hemoglobin: 13.7 g/dL (ref 12.0–15.0)
MCH: 31.1 pg (ref 26.0–34.0)
MCHC: 33 g/dL (ref 30.0–36.0)
MCV: 94.1 fL (ref 80.0–100.0)
Platelets: 365 10*3/uL (ref 150–400)
RBC: 4.41 MIL/uL (ref 3.87–5.11)
RDW: 12.5 % (ref 11.5–15.5)
WBC: 10.6 10*3/uL — ABNORMAL HIGH (ref 4.0–10.5)
nRBC: 0 % (ref 0.0–0.2)

## 2021-02-10 LAB — CBG MONITORING, ED: Glucose-Capillary: 308 mg/dL — ABNORMAL HIGH (ref 70–99)

## 2021-02-10 NOTE — ED Provider Notes (Signed)
Emergency Medicine Provider Triage Evaluation Note  Robin Montgomery , a 60 y.o. female  was evaluated in triage.  Pt complains of lower abdominal pain for the last 3 days.  Had a new sexual partner 3 days ago.  [employer] in Editor, commissioning.  Review of Systems  Positive: Nausea, vomiting, vaginal discharge, diarrhea  Negative: Fever, chills, urinary complaints  Physical Exam  BP (!) 145/83   Pulse (!) 101   Temp 98.3 F (36.8 C)   Resp 17   SpO2 94%  Gen:   Awake, no distress  Resp:  Normal effort, clear to auscultation bilaterally MSK:   Moves extremities without difficulty   Other:  Lower abdominal tenderness, good bowel sounds heard throughout all 4 quadrants, heart sounds are normal and regular.  Medical Decision Making  Medically screening exam initiated at 2:13 PM.  Appropriate orders placed.  Robin Montgomery was informed that the remainder of the evaluation will be completed by another provider, this initial triage assessment does not replace that evaluation, and the importance of remaining in the ED until their evaluation is complete.     Honor Loh [place], PA-C 02/10/21 1416    Gerhard Munch, MD 02/10/21 [phone]

## 2021-02-10 NOTE — ED Notes (Signed)
Patient states she will come back in the morning ad try to be seen again

## 2021-02-10 NOTE — ED Notes (Signed)
Pt stepped outside.  

## 2021-02-10 NOTE — ED Notes (Signed)
Pt stated "I'm going outside to smoke a cigarette."

## 2021-02-10 NOTE — ED Triage Notes (Signed)
Pt here from with c/o abd pain along with some n/v and some vaginal discharge, no bleeding or burning upon urination

## 2021-02-11 ENCOUNTER — Encounter (HOSPITAL_COMMUNITY): Payer: Self-pay

## 2021-02-11 ENCOUNTER — Emergency Department (HOSPITAL_COMMUNITY): Payer: Medicaid Other

## 2021-02-11 ENCOUNTER — Emergency Department (HOSPITAL_COMMUNITY)
Admission: EM | Admit: 2021-02-11 | Discharge: 2021-02-11 | Disposition: A | Discharge status: Home / Self Care | Payer: Medicaid Other | Attending: Emergency Medicine | Admitting: Emergency Medicine

## 2021-02-11 ENCOUNTER — Other Ambulatory Visit: Payer: Self-pay

## 2021-02-11 ENCOUNTER — Telehealth: Payer: Self-pay

## 2021-02-11 DIAGNOSIS — Z794 Long term (current) use of insulin: Secondary | ICD-10-CM | POA: Diagnosis not present

## 2021-02-11 DIAGNOSIS — Z202 Contact with and (suspected) exposure to infections with a predominantly sexual mode of transmission: Secondary | ICD-10-CM | POA: Insufficient documentation

## 2021-02-11 DIAGNOSIS — E119 Type 2 diabetes mellitus without complications: Secondary | ICD-10-CM | POA: Insufficient documentation

## 2021-02-11 DIAGNOSIS — Z7984 Long term (current) use of oral hypoglycemic drugs: Secondary | ICD-10-CM | POA: Diagnosis not present

## 2021-02-11 DIAGNOSIS — F17210 Nicotine dependence, cigarettes, uncomplicated: Secondary | ICD-10-CM | POA: Insufficient documentation

## 2021-02-11 DIAGNOSIS — K5792 Diverticulitis of intestine, part unspecified, without perforation or abscess without bleeding: Secondary | ICD-10-CM | POA: Insufficient documentation

## 2021-02-11 DIAGNOSIS — K5732 Diverticulitis of large intestine without perforation or abscess without bleeding: Secondary | ICD-10-CM | POA: Diagnosis not present

## 2021-02-11 DIAGNOSIS — I10 Essential (primary) hypertension: Secondary | ICD-10-CM | POA: Diagnosis not present

## 2021-02-11 DIAGNOSIS — Z79899 Other long term (current) drug therapy: Secondary | ICD-10-CM | POA: Diagnosis not present

## 2021-02-11 DIAGNOSIS — R109 Unspecified abdominal pain: Secondary | ICD-10-CM | POA: Diagnosis present

## 2021-02-11 LAB — CBC WITH DIFFERENTIAL/PLATELET
Abs Immature Granulocytes: 0.03 10*3/uL (ref 0.00–0.07)
Basophils Absolute: 0 10*3/uL (ref 0.0–0.1)
Basophils Relative: 0 %
Eosinophils Absolute: 0.1 10*3/uL (ref 0.0–0.5)
Eosinophils Relative: 1 %
HCT: 42.5 % (ref 36.0–46.0)
Hemoglobin: 13.9 g/dL (ref 12.0–15.0)
Immature Granulocytes: 0 %
Lymphocytes Relative: 26 %
Lymphs Abs: 2.3 10*3/uL (ref 0.7–4.0)
MCH: 30.7 pg (ref 26.0–34.0)
MCHC: 32.7 g/dL (ref 30.0–36.0)
MCV: 93.8 fL (ref 80.0–100.0)
Monocytes Absolute: 0.7 10*3/uL (ref 0.1–1.0)
Monocytes Relative: 8 %
Neutro Abs: 5.7 10*3/uL (ref 1.7–7.7)
Neutrophils Relative %: 65 %
Platelets: 324 10*3/uL (ref 150–400)
RBC: 4.53 MIL/uL (ref 3.87–5.11)
RDW: 12.5 % (ref 11.5–15.5)
WBC: 8.8 10*3/uL (ref 4.0–10.5)
nRBC: 0 % (ref 0.0–0.2)

## 2021-02-11 LAB — COMPREHENSIVE METABOLIC PANEL
ALT: 34 U/L (ref 0–44)
AST: 16 U/L (ref 15–41)
Albumin: 4.2 g/dL (ref 3.5–5.0)
Alkaline Phosphatase: 95 U/L (ref 38–126)
Anion gap: 11 (ref 5–15)
BUN: 7 mg/dL (ref 6–20)
CO2: 28 mmol/L (ref 22–32)
Calcium: 9.5 mg/dL (ref 8.9–10.3)
Chloride: 101 mmol/L (ref 98–111)
Creatinine, Ser: 0.62 mg/dL (ref 0.44–1.00)
GFR, Estimated: >60 mL/min (ref >60)
Glucose, Bld: 289 mg/dL — ABNORMAL HIGH (ref 70–99)
Potassium: 3.3 mmol/L — ABNORMAL LOW (ref 3.5–5.1)
Sodium: 140 mmol/L (ref 135–145)
Total Bilirubin: 0.6 mg/dL (ref 0.3–1.2)
Total Protein: 8.4 g/dL — ABNORMAL HIGH (ref 6.5–8.1)

## 2021-02-11 LAB — WET PREP, GENITAL
Clue Cells Wet Prep HPF POC: NONE SEEN
Sperm: NONE SEEN
Yeast Wet Prep HPF POC: NONE SEEN

## 2021-02-11 LAB — LIPASE, BLOOD: Lipase: 25 U/L (ref 11–51)

## 2021-02-11 MED ORDER — AMOXICILLIN-POT CLAVULANATE 875-125 MG PO TABS
1.0000 | ORAL_TABLET | Freq: Two times a day (BID) | ORAL | 0 refills | Status: DC
Start: 2021-02-11 — End: 2021-06-21

## 2021-02-11 MED ORDER — AZITHROMYCIN 250 MG PO TABS
500.0000 mg | ORAL_TABLET | Freq: Once | ORAL | Status: AC
  Administered 2021-02-11: 500 mg via ORAL
  Filled 2021-02-11: qty 2

## 2021-02-11 MED ORDER — IOHEXOL 350 MG/ML SOLN
80.0000 mL | Freq: Once | INTRAVENOUS | Status: AC | PRN
  Administered 2021-02-11: 80 mL via INTRAVENOUS

## 2021-02-11 MED ORDER — NAPROXEN 375 MG PO TABS: 375.0000 mg | ORAL_TABLET | Freq: Two times a day (BID) | ORAL | 0 refills | Status: DC

## 2021-02-11 MED ORDER — SODIUM CHLORIDE 0.9 % IV SOLN
1.0000 g | Freq: Once | INTRAVENOUS | Status: AC
  Administered 2021-02-11: 1 g via INTRAVENOUS
  Filled 2021-02-11: qty 10

## 2021-02-11 NOTE — ED Notes (Signed)
Pt urine specimen obtained, placed in triage urine specimen bin, holding for order.

## 2021-02-11 NOTE — ED Triage Notes (Signed)
Pt states green discharge, burning, n/v, headache, and abd pain x1 week. Last unprotected sex was 1 week ago prior to symptoms and is requesting STD check.

## 2021-02-11 NOTE — ED Notes (Signed)
Patient given crackers and ginger ale for upset stomach

## 2021-02-11 NOTE — Discharge Instructions (Signed)
Take the antibiotics as prescribed.  Take the medications for pain as needed.  Follow-up with a primary care doctor to be rechecked to make sure your symptoms are improving

## 2021-02-11 NOTE — ED Provider Notes (Signed)
Emergency Medicine Provider Triage Evaluation Note  Robin Montgomery , a 60 y.o. female  was evaluated in triage.  Pt complains of vaginal discharge that began a week ago.  Was seen evaluated at [facility] but left due to wait time yesterday.  Complains of associated lower abdominal pain which is crampy in character, nausea, vomiting, subjective fever, and chills.  Review of Systems  Positive: Dysuria  Negative: Hematuria, diarrhea  Physical Exam  BP 136/89 (BP Location: Left Arm)   Pulse 99   Temp 97.9 F (36.6 C) (Oral)   Resp 18   Ht 5\' 4"  (1.626 m)   SpO2 99%   BMI 25.34 kg/m  Gen:   Awake, no distress   Resp:  Normal effort, clear to auscultation bilaterally MSK:   Moves extremities without difficulty  Other:  Moderate lower abdominal tenderness, mild upper abdominal tenderness.  Medical Decision Making  Medically screening exam initiated at 3:03 PM.  Appropriate orders placed.  was informed that the remainder of the evaluation will be completed by another provider, this initial triage assessment does not replace that evaluation, and the importance of remaining in the ED until their evaluation is complete.     [name] [place], PA-C 02/11/21 1504    04/13/21, MD 02/13/21 1326

## 2021-02-11 NOTE — Telephone Encounter (Signed)
Transition Care Management Unsuccessful Follow-up Telephone Call  Date of discharge and from where:  02/10/2021 [place]  Attempts:  1st Attempt  Reason for unsuccessful TCM follow-up call:  Left voice message    

## 2021-02-11 NOTE — ED Provider Notes (Signed)
[facility] DEPT Provider Note   CSN: [id number] Arrival date & time: 02/11/21  1417     History Chief Complaint  Patient presents with  . Exposure to STD    Robin Montgomery is a 60 y.o. female.   Exposure to STD   Patient presents to the ED with complaints of abdominal pain, vaginal discharge.  Patient states that symptoms started about a week ago.  She is having yellow vaginal discharge.  She thinks that her boyfriend gave her an STD.  She is also having some cramping lower abdominal pain.  She is having some nausea and vomiting.  She has had chills.  Subjective fevers but not measured.  She denies any diarrhea.  Patient states she has had cholecystectomy in the past.  Past Medical History:  Diagnosis Date  . Depression   . Diabetes mellitus without complication ([place])   . Hypertension     Patient Active Problem List   Diagnosis Date Noted  . Left medial knee pain 07/31/2016  . Essential hypertension 11/17/2015  . Alcohol abuse   . MDD (major depressive disorder), recurrent severe, without psychosis ([place]) 02/12/2015  . Substance induced mood disorder ([place]) 02/12/2015  . Polysubstance abuse ([place])   . Wrist pain, chronic 12/22/2014  . Trochanteric bursitis of left hip 12/22/2014  . Seasonal allergies 09/12/2014  . Tobacco dependence 09/12/2014  . Shortness of breath 09/12/2014  . Diabetes mellitus type 2, insulin dependent ([place]) 04/01/2014  . Pain due to onychomycosis of toenail 04/01/2014  . Cocaine abuse ([place]) 04/01/2014    Past Surgical History:  Procedure Laterality Date  . CHOLECYSTECTOMY       OB History   No obstetric history on file.     History reviewed. No pertinent family history.  Social History   Tobacco Use  . Smoking status: Every Day    Packs/day: 0.25    Years: 48.00    Pack years: 12.00    Types: Cigarettes    Start date: 68  . Smokeless tobacco: Former    Types: Snuff  . Tobacco comments:     Patient states she is trying to decrease smoking.  States she smokes 4 - 5 cigarettes daily  Vaping Use  . Vaping Use: Never used  Substance Use Topics  . Alcohol use: Yes    Alcohol/week: 0.0 standard drinks    Comment: every other week   . Drug use: Yes    Types: Cocaine, Marijuana    Home Medications Prior to Admission medications   Medication Sig Start Date End Date Taking? Authorizing Provider  amoxicillin-clavulanate (AUGMENTIN) 875-125 MG tablet Take 1 tablet by mouth 2 (two) times daily. 02/11/21  Yes Dorie Rank, MD  naproxen (NAPROSYN) 375 MG tablet Take 1 tablet (375 mg total) by mouth 2 (two) times daily. 02/11/21  Yes Dorie Rank, MD  Accu-Chek Softclix Lancets lancets Use as instructed to check blood sugar twice daily. E11.65 01/28/21   Gildardo Pounds, NP  acetaminophen (TYLENOL) 500 MG tablet Take 1 tablet (500 mg total) by mouth every 6 (six) hours as needed. Patient taking differently: Take 500 mg by mouth every 6 (six) hours as needed for moderate pain. 08/19/19   Corena Herter, PA-C  albuterol (VENTOLIN HFA) 108 (90 Base) MCG/ACT inhaler INHALE 1 PUFF INTO THE LUNGS EVERY 6 HOURS AS NEEDED FOR WHEEZING OR SHORTNESS OF BREATH. 01/28/21   Gildardo Pounds, NP  amoxicillin (AMOXIL) 500 MG capsule Take 1 capsule (500 mg  total) by mouth 3 (three) times daily. Patient not taking: No sig reported 11/19/20     ARIPiprazole (ABILIFY) 10 MG tablet Take 1 tablet (10 mg total) by mouth daily. 02/01/21   Salley Slaughter, NP  atorvastatin (LIPITOR) 40 MG tablet Take 1 tablet (40 mg total) by mouth daily. 01/28/21   Gildardo Pounds, NP  Blood Glucose Monitoring Suppl (ACCU-CHEK GUIDE ME) w/Device KIT Use to check blood sugar twice daily. E11.65 01/26/21   Gildardo Pounds, NP  Blood Pressure Monitor DEVI Please provide patient with insurance approved blood pressure monitor. 01/26/21   Gildardo Pounds, NP  budesonide-formoterol [facility]) 160-4.5 MCG/ACT inhaler Inhale 2 puffs into the  lungs 2 (two) times daily. 01/28/21   Gildardo Pounds, NP  Deutetrabenazine (AUSTEDO) 12 MG TABS Take 18 mg by mouth in the morning and at bedtime. 01/29/21   Eulis Canner E, NP  glucose blood (ACCU-CHEK GUIDE) test strip Use to check blood sugar twice daily. E11.65 01/28/21   Gildardo Pounds, NP  insulin detemir (LEVEMIR FLEXTOUCH) 100 UNIT/ML FlexPen Inject 30 Units into the skin 2 (two) times daily. 01/28/21 04/28/21  Gildardo Pounds, NP  Insulin Pen Needle (TECHLITE PEN NEEDLES) 31G X 5 MM MISC use as instructed.inject into the skin once nightly 01/28/21   Gildardo Pounds, NP  lisinopril (ZESTRIL) 40 MG tablet Take 1 tablet (40 mg total) by mouth daily. 01/28/21 04/28/21  Gildardo Pounds, NP  metFORMIN (GLUCOPHAGE XR) 500 MG 24 hr tablet Take 1 tablet (500 mg total) by mouth daily with breakfast. 01/28/21   Gildardo Pounds, NP  Misc. Devices (OFFSET CANE) MISC Please provide patient with single cane. M70.62 01/26/21   Gildardo Pounds, NP  omeprazole (PRILOSEC) 40 MG capsule Take 1 capsule (40 mg total) by mouth daily. 01/28/21 04/28/21  Gildardo Pounds, NP  traMADol (ULTRAM) 50 MG tablet take 1 tablet (50 mg) by oral route every 4-6 hours as needed Patient not taking: Reported on 01/19/2021 11/19/20     traZODone (DESYREL) 50 MG tablet Take 0.5 tablets (25 mg total) by mouth at bedtime as needed for sleep. 01/29/21   Salley Slaughter, NP    Allergies    Aspirin  Review of Systems   Review of Systems  All other systems reviewed and are negative.  Physical Exam Updated Vital Signs BP 123/71   Pulse 87   Temp 97.8 F (36.6 C) (Oral)   Resp 18   Ht 1.626 m (_0 )   Wt 67 kg   SpO2 98%   BMI 25.34 kg/m   Physical Exam Vitals and nursing note reviewed.  Constitutional:      General: She is not in acute distress.    Appearance: She is well-developed.  HENT:     Head: Normocephalic and atraumatic.     Right Ear: External ear normal.     Left Ear: External ear normal.   Eyes:     General: No scleral icterus.       Right eye: No discharge.        Left eye: No discharge.     Conjunctiva/sclera: Conjunctivae normal.  Neck:     Trachea: No tracheal deviation.  Cardiovascular:     Rate and Rhythm: Normal rate and regular rhythm.  Pulmonary:     Effort: Pulmonary effort is normal. No respiratory distress.     Breath sounds: Normal breath sounds. No stridor. No wheezing or rales.  Abdominal:  General: Bowel sounds are normal. There is no distension.     Palpations: Abdomen is soft.     Tenderness: There is abdominal tenderness in the suprapubic area. There is no guarding or rebound.  Genitourinary:    Labia:        Right: No rash.        Left: No rash.      Vagina: Vaginal discharge present.     Cervix: Discharge and friability present. No cervical motion tenderness.     Uterus: Normal.      Adnexa: Right adnexa normal and left adnexa normal.     Comments: Green vaginal discharge Musculoskeletal:        General: No tenderness or deformity.     Cervical back: Neck supple.  Skin:    General: Skin is warm and dry.     Findings: No rash.  Neurological:     General: No focal deficit present.     Mental Status: She is alert.     Cranial Nerves: No cranial nerve deficit (no facial droop, extraocular movements intact, no slurred speech).     Sensory: No sensory deficit.     Motor: No abnormal muscle tone or seizure activity.     Coordination: Coordination normal.  Psychiatric:        Mood and Affect: Mood normal.    ED Results / Procedures / Treatments   Labs (all labs ordered are listed, but only abnormal results are displayed) Labs Reviewed  COMPREHENSIVE METABOLIC PANEL - Abnormal; Notable for the following components:      Result Value   Potassium 3.3 (*)    Glucose, Bld 289 (*)    Total Protein 8.4 (*)    All other components within normal limits  URINE CULTURE  WET PREP, GENITAL  CBC WITH DIFFERENTIAL/PLATELET  LIPASE, BLOOD   URINALYSIS, ROUTINE W REFLEX MICROSCOPIC  RPR  HIV ANTIBODY (ROUTINE TESTING W REFLEX)  GC/CHLAMYDIA PROBE AMP ([place]) NOT AT [facility]    EKG None  Radiology CT ABDOMEN PELVIS W CONTRAST  Result Date: 02/11/2021 CLINICAL DATA:  Lower abdominal pain with nausea and vomiting. EXAM: CT ABDOMEN AND PELVIS WITH CONTRAST TECHNIQUE: Multidetector CT imaging of the abdomen and pelvis was performed using the standard protocol following bolus administration of intravenous contrast. CONTRAST:  87m OMNIPAQUE IOHEXOL 350 MG/ML SOLN COMPARISON:  None. FINDINGS: Lower chest: No acute abnormality. Hepatobiliary: No suspicious hepatic lesion. Gallbladder surgically absent. No biliary ductal dilation. Pancreas: Unremarkable. No pancreatic ductal dilatation or surrounding inflammatory changes. Spleen: Normal in size without focal abnormality. Adrenals/Urinary Tract: Adrenal glands are unremarkable. Kidneys are normal, without renal calculi, solid enhancing lesion, or hydronephrosis. Bladder is unremarkable for degree of distension. Stomach/Bowel: Small hiatal hernia otherwise the stomach is unremarkable. No pathologic dilation of small or large bowel. The appendix and terminal ileum appear normal. Colonic diverticulosis with an inflamed descending colonic diverticulum with adjacent wall thickening and stranding. Vascular/Lymphatic: Aortic and branch vessel atherosclerosis without abdominal aortic aneurysm. No pathologically enlarged abdominal or pelvic lymph nodes. Reproductive: Uterus and bilateral adnexa are unremarkable. Other: No walled off fluid collections.  No pneumoperitoneum. Musculoskeletal: Thoracolumbar spondylosis. No acute osseous abnormality. IMPRESSION: 1. Acute uncomplicated descending colonic diverticulitis. 2. Small hiatal hernia. 3.  Aortic Atherosclerosis (ICD10-I70.0). Electronically Signed   By: JDahlia BailiffM.D.   On: 02/11/2021 19:47    Procedures Procedures   Medications Ordered in  ED Medications  cefTRIAXone (ROCEPHIN) 1 g in sodium chloride 0.9 % 100 mL IVPB (has  no administration in time range)  azithromycin (ZITHROMAX) tablet 500 mg (has no administration in time range)  iohexol (OMNIPAQUE) 350 MG/ML injection 80 mL (80 mLs Intravenous Contrast Given 02/11/21 1913)    ED Course  I have reviewed the triage vital signs and the nursing notes.  Pertinent labs & imaging results that were available during my care of the patient were reviewed by me and considered in my medical decision making (see chart for details).  Clinical Course as of 02/11/21 2132  Thu Feb 11, 2021  2132 Labs reviewed.  No leukocytosis.  Metabolic panel unremarkable.  Lipase normal.  Urinalysis not provided.  CT scan shows findings consistent with uncomplicated diverticulitis [JK]    Clinical Course User Index [JK] Dorie Rank, MD   MDM Rules/Calculators/A&P                           Patient presented with complaints of abdominal pain as well as concern for STD.  On exam patient did not have notable vaginal discharge.  No findings of PID but is concerning for possible STD.  Will treat empirically with Rocephin and azithromycin.  We will send off GC chlamydia cultures.  Patient was also having abdominal pain and her CT scan does show evidence of diverticulitis.  We will treat with antibiotic course to cover for diverticulitis as opposed to PID right now.  Recommend close outpatient follow-up.  Patient appears good candidate for outpatient management.  She is not requiring pain medications.  Laboratory tests are otherwise reassuring.  No signs of sepsis. Final Clinical Impression(s) / ED Diagnoses Final diagnoses:  Diverticulitis  Possible exposure to STD    Rx / DC Orders ED Discharge Orders          Ordered    amoxicillin-clavulanate (AUGMENTIN) 875-125 MG tablet  2 times daily        02/11/21 2127    naproxen (NAPROSYN) 375 MG tablet  2 times daily        02/11/21 2127              Dorie Rank, MD 02/11/21 2132

## 2021-02-11 NOTE — ED Notes (Signed)
Pt ambulatory in ED lobby. 

## 2021-02-12 ENCOUNTER — Telehealth: Payer: Self-pay

## 2021-02-12 LAB — HIV ANTIBODY (ROUTINE TESTING W REFLEX): HIV Screen 4th Generation wRfx: NONREACTIVE

## 2021-02-12 LAB — RPR: RPR Ser Ql: NONREACTIVE

## 2021-02-12 LAB — GC/CHLAMYDIA PROBE AMP (~~LOC~~) NOT AT ARMC
Chlamydia: NEGATIVE
Comment: NEGATIVE
Comment: NORMAL
Neisseria Gonorrhea: NEGATIVE

## 2021-02-12 NOTE — Telephone Encounter (Signed)
Transition Care Management Unsuccessful Follow-up Telephone Call  Date of discharge and from where:  02/11/2021-[facility]  Attempts:  1st Attempt  Reason for unsuccessful TCM follow-up call:  Left voice message    

## 2021-02-12 NOTE — Telephone Encounter (Signed)
Transition Care Management Unsuccessful Follow-up Telephone Call  Date of discharge and from where:  02/10/2021-[place] [facility]   Attempts:  2nd Attempt  Reason for unsuccessful TCM follow-up call:  Left voice message

## 2021-02-15 NOTE — Telephone Encounter (Signed)
Transition Care Management Unsuccessful Follow-up Telephone Call  Date of discharge and from where:  02/11/2021 from [facility]  Attempts:  2nd Attempt  Reason for unsuccessful TCM follow-up call:  Left voice message    

## 2021-02-15 NOTE — Telephone Encounter (Signed)
Transition Care Management Unsuccessful Follow-up Telephone Call  Date of discharge and from where:  02/10/2021 from [facility]  Attempts:  3rd Attempt  Reason for unsuccessful TCM follow-up call:  Unable to reach patient

## 2021-02-16 ENCOUNTER — Other Ambulatory Visit: Payer: Self-pay

## 2021-02-16 NOTE — Telephone Encounter (Signed)
Transition Care Management Unsuccessful Follow-up Telephone Call  Date of discharge and from where:  02/11/2021 from [facility]  Attempts:  3rd Attempt  Reason for unsuccessful TCM follow-up call:  Unable to reach patient    

## 2021-02-16 NOTE — Patient Outreach (Signed)
Care Coordination  02/16/2021  Robin Montgomery 1960-12-05 [id number]  02/16/2021 Name: Robin Montgomery MRN: [id number] DOB: Jul 05, 1960  Referred by: Claiborne Rigg, NP Reason for referral : High Risk Managed Medicaid (Unsuccessful Telephone Outreach)   An unsuccessful telephone outreach was attempted today. The patient was referred to the case management team for assistance with care management and care coordination.    Follow Up Plan: A HIPAA compliant phone message was left for the patient providing contact information and requesting a return call.    Virgina Norfolk RN, BSN Community Care Coordinator [facility]  [facility] Mobile: [phone]

## 2021-02-16 NOTE — Patient Instructions (Signed)
02/16/2021 Name: Robin Montgomery MRN: [id number] DOB: April 01, 1961  Referred by: Claiborne Rigg, NP Reason for referral : High Risk Managed Medicaid (Unsuccessful Telephone Outreach)   An unsuccessful telephone outreach was attempted today. The patient was referred to the case management team for assistance with care management and care coordination.    Follow Up Plan: A HIPAA compliant phone message was left for the patient providing contact information and requesting a return call.    Virgina Norfolk RN, BSN Community Care Coordinator [facility]  [facility] Mobile: [phone]

## 2021-02-18 ENCOUNTER — Inpatient Hospital Stay: Admission: RE | Admit: 2021-02-18 | Payer: Medicaid Other | Source: Ambulatory Visit

## 2021-02-19 ENCOUNTER — Telehealth: Payer: Self-pay | Admitting: Nurse Practitioner

## 2021-02-19 NOTE — Telephone Encounter (Signed)
..   Medicaid Managed Care   Unsuccessful Outreach Note  02/19/2021 Name: Robin Montgomery MRN: [id number] DOB: [birth year]  Referred by: Claiborne Rigg, NP Reason for referral : High Risk Managed Medicaid (I called this patient today to get her phone visit with the [facility] RNCM rescheduled but her phone does not seem to be in working order today.)   An unsuccessful telephone outreach was attempted today. The patient was referred to the case management team for assistance with care management and care coordination.   Follow Up Plan: The care management team will reach out to the patient again over the next 7 days.   Weston Settle Care Guide, High Risk Medicaid Managed Care Embedded Care Coordination [facility]  [facility]

## 2021-02-25 ENCOUNTER — Telehealth: Payer: Self-pay | Admitting: Nurse Practitioner

## 2021-02-25 NOTE — Telephone Encounter (Signed)
..   Medicaid Managed Care   Unsuccessful Outreach Note  02/25/2021 Name: Robin Montgomery MRN: [id number] DOB: 11/21/1960  Referred by: Claiborne Rigg, NP Reason for referral : High Risk Managed Medicaid (2nd attempt to reach this patient to get her phone visits with the [facility] and pharmacist rescheduled. Unable to leave a message.)   A second unsuccessful telephone outreach was attempted today. The patient was referred to the case management team for assistance with care management and care coordination.   Follow Up Plan: The care management team will reach out to the patient again over the next 7 days.   Weston Settle Care Guide, High Risk Medicaid Managed Care Embedded Care Coordination [facility]  [facility]

## 2021-03-01 ENCOUNTER — Ambulatory Visit: Payer: Self-pay

## 2021-03-14 ENCOUNTER — Other Ambulatory Visit (HOSPITAL_COMMUNITY): Payer: Self-pay | Admitting: Psychiatry

## 2021-03-14 DIAGNOSIS — F1994 Other psychoactive substance use, unspecified with psychoactive substance-induced mood disorder: Secondary | ICD-10-CM

## 2021-03-16 ENCOUNTER — Other Ambulatory Visit: Payer: Self-pay

## 2021-03-16 NOTE — Patient Outreach (Signed)
Care Coordination  03/16/2021  Robin Montgomery January 12, 1961 [id number]  03/16/2021 Name: Robin Montgomery MRN: [id number] DOB: November 20, 1960  Referred by: Claiborne Rigg, NP Reason for referral : High Risk Managed Medicaid (Unsuccessful Telephone Outreach)   A second unsuccessful telephone outreach was attempted today. The patient was referred to the case management team for assistance with care management and care coordination.    Follow Up Plan: A HIPAA compliant phone message was left for the patient providing contact information and requesting a return call.    Virgina Norfolk RN, BSN Community Care Coordinator [facility]  [facility] Mobile: [phone]

## 2021-03-16 NOTE — Patient Instructions (Signed)
03/16/2021 Name: Robin Montgomery MRN: [id number] DOB: July 17, 1960  Referred by: Claiborne Rigg, NP Reason for referral : High Risk Managed Medicaid (Unsuccessful Telephone Outreach)   A second unsuccessful telephone outreach was attempted today. The patient was referred to the case management team for assistance with care management and care coordination.    Follow Up Plan: A HIPAA compliant phone message was left for the patient providing contact information and requesting a return call.    Virgina Norfolk RN, BSN Community Care Coordinator [facility]  [facility] Mobile: [phone]

## 2021-03-17 ENCOUNTER — Other Ambulatory Visit (HOSPITAL_COMMUNITY): Payer: Self-pay | Admitting: *Deleted

## 2021-03-17 ENCOUNTER — Other Ambulatory Visit: Payer: Self-pay

## 2021-03-17 ENCOUNTER — Telehealth: Payer: Self-pay | Admitting: Nurse Practitioner

## 2021-03-17 DIAGNOSIS — G2401 Drug induced subacute dyskinesia: Secondary | ICD-10-CM

## 2021-03-17 MED ORDER — AUSTEDO 12 MG PO TABS: 36.0000 mg | ORAL_TABLET | Freq: Every day | ORAL | 2 refills | Status: DC

## 2021-03-17 NOTE — Patient Outreach (Signed)
Medicaid Managed Care   Nurse Care Manager Note  03/17/2021 Name:  Robin Montgomery MRN:  [id number] DOB:  Aug 25, 1960  Robin Montgomery is an 60 y.o. year old female who is a primary patient of Robin Pounds, NP.  The [facility] Managed Care Coordination team was consulted for assistance with:    HTN HLD COPD DMII  Robin Montgomery was given information about Medicaid Managed Care Coordination team services today. Robin Montgomery Patient agreed to services and verbal consent obtained.  Engaged with patient by telephone for follow up visit in response to provider referral for case management and/or care coordination services.   Assessments/Interventions:  Review of past medical history, allergies, medications, health status, including review of consultants reports, laboratory and other test data, was performed as part of comprehensive evaluation and provision of chronic care management services.  SDOH (Social Determinants of Health) assessments and interventions performed:   Care Plan  Allergies  Allergen Reactions   Aspirin Nausea And Vomiting    Upset stomach    Medications Reviewed Today     Reviewed by Inge Rise, RN (Case Manager) on 01/19/21 at [place] List Status: <None>   Medication Order Taking? Sig Documenting Provider Last Dose Status Informant  Accu-Chek Softclix Lancets lancets [id number]  Use as instructed to check blood sugar twice daily. Robin Pounds, NP  Active   acetaminophen (TYLENOL) 500 MG tablet [id number]  Take 1 tablet (500 mg total) by mouth every 6 (six) hours as needed.  Patient taking differently: Take 500 mg by mouth every 6 (six) hours as needed for moderate pain.   Corena Herter, PA-C  Active Self  albuterol (VENTOLIN HFA) 108 (90 Base) MCG/ACT inhaler [id number]  INHALE 1 PUFF INTO THE LUNGS EVERY 6 HOURS AS NEEDED FOR WHEEZING OR SHORTNESS OF BREATH. Robin Pounds, NP  Active   amoxicillin (AMOXIL) 500 MG capsule [id number]  Take 1  capsule (500 mg total) by mouth 3 (three) times daily.   Active   ARIPiprazole, sensor, 10 MG TABS [id number]  Take 10 mg by mouth daily.  Patient not taking: Reported on 04/23/2020   Salley Slaughter, NP  Active   atorvastatin (LIPITOR) 40 MG tablet [id number]  Take 1 tablet (40 mg total) by mouth daily. Robin Pounds, NP  Active   Blood Glucose Monitoring Suppl (ACCU-CHEK GUIDE ME) w/Device KIT [id number]  Use to check blood sugar twice daily. Charlott Rakes, MD  Active Self  budesonide-formoterol [facility]) 160-4.5 MCG/ACT inhaler [id number]  Inhale 2 puffs into the lungs 2 (two) times daily. Robin Pounds, NP  Active   Deutetrabenazine (AUSTEDO) 12 MG TABS [id number]  Take 18 mg by mouth in the morning and at bedtime.  Patient not taking: Reported on 04/23/2020   Salley Slaughter, NP  Active   Dulaglutide (TRULICITY) 6.83 MH/9.6QI SOPN [id number]  Inject 0.75 mg into the skin once a week. Robin Pounds, NP  Active   glucose blood (ACCU-CHEK GUIDE) test strip [id number]  Use as instructed Robin Pounds, NP  Active   insulin detemir (LEVEMIR FLEXTOUCH) 100 UNIT/ML FlexPen [id number]  Inject 30 Units into the skin 2 (two) times daily. Robin Pounds, NP  Active   Insulin Pen Needle (TECHLITE PEN NEEDLES) 31G X 5 MM MISC [id number]  use as instructed.inject into the skin once nightly Robin Pounds, NP  Active   lisinopril (ZESTRIL) 40 MG tablet [id number]  Take 1 tablet (40 mg total) by  mouth daily. Robin Pounds, NP  Active   metFORMIN (GLUCOPHAGE) 1000 MG tablet [id number]  Take 1 tablet (1,000 mg total) by mouth 2 (two) times daily with a meal.  Patient not taking: Reported on 01/19/2021   Robin Pounds, NP  Active   omeprazole (PRILOSEC) 40 MG capsule [id number]  Take 1 capsule (40 mg total) by mouth daily.  Patient not taking: Reported on 01/19/2021   Robin Pounds, NP  Active   traMADol (ULTRAM) 50 MG tablet [id number]  take 1 tablet (50 mg) by oral route every 4-6 hours  as needed   Active   traZODone (DESYREL) 50 MG tablet [id number]  Take 0.5 tablets (25 mg total) by mouth at bedtime as needed for sleep. Salley Slaughter, NP  Active             Patient Active Problem List   Diagnosis Date Noted   Left medial knee pain 07/31/2016   Essential hypertension 11/17/2015   Alcohol abuse    MDD (major depressive disorder), recurrent severe, without psychosis ([place]) 02/12/2015   Substance induced mood disorder ([place]) 02/12/2015   Polysubstance abuse ([place])    Wrist pain, chronic 12/22/2014   Trochanteric bursitis of left hip 12/22/2014   Seasonal allergies 09/12/2014   Tobacco dependence 09/12/2014   Shortness of breath 09/12/2014   Diabetes mellitus type 2, insulin dependent ([place]) 04/01/2014   Pain due to onychomycosis of toenail 04/01/2014   Cocaine abuse ([place]) 04/01/2014    Conditions to be addressed/monitored per PCP order:  HTN, HLD, COPD, and DMII  Care Plan : RN Care Manager Plan of Care  Updates made by Inge Rise, RN since 03/17/2021 12:00 AM     Problem: Chronic Disease Management & Care Coordination Needs for HTN, DM II, COPD, HLD, Depression   Priority: High  Onset Date: 01/19/2021     Long-Range Goal: Development of Plan of Care for Chronic Disease Management and Care Coordination Needs   Start Date: 01/19/2021  Expected End Date: 05/19/2021  Priority: High  Note:   Current Barriers:  Knowledge Deficits related to plan of care for management of HTN, HLD, COPD, DMII. Care Coordination needs related to Financial constraints related to obtaining medications, Medication procurement, and Literacy concerns  Chronic Disease Management support and education needs related to HTN, HLD, COPD, DMII. Film/video editor.  Literacy barriers Non-adherence to scheduled provider appointments Non-adherence to prescribed medication regimen Difficulty obtaining medications  RNCM Clinical Goal(s):  Patient will verbalize understanding  of plan for management of HTN, HLD, COPD, DMII verbalize basic understanding of HTN, HLD, COPD, DMII disease process and self health management plan  take all medications exactly as prescribed and will call provider for medication related questions attend all scheduled medical appointments: No upcoming scheduled PCP appointment continue to work with RN Care Manager to address care management and care coordination needs related to HTN, HLD, COPD, DMII. work with pharmacist to address Financial constraints related to obtaining medications and Medication procurement related to HTN, HLD, COPD, DMII. work with Education officer, museum to Civil Service fast streamer constraints related to obtaining medications, Medication procurement, and Literacy concerns related to the management of HTN, HLD, COPD, DMII through collaboration with Consulting civil engineer, provider, and care team.   Interventions: Inter-disciplinary care team collaboration (see longitudinal plan of care) Evaluation of current treatment plan related to  self management and patient's adherence to plan as established by provider   COPD: (Status: New goal.) Provided patient with basic written and verbal  COPD education on self care/management/and exacerbation prevention; Advised patient to track and manage COPD triggers. Provided instruction about proper use of medications used for management of COPD including inhalers. Patient does not have inhalers available to her due to financial difficulties obtaining numerous medications. 03/17/2021:  Patient not taking any of prescribed medications except insulin and medication at bedtime for sleep.  Plan to schedule PCP office visit per patient's request to be assessed and determine what all medications are for. Advised patient to self assesses COPD action plan zone and make appointment with provider if in the yellow zone for 48 hours without improvement Discussed the importance of adequate rest and management of fatigue with  COPD Assessed social determinant of health barriers   Diabetes:  (Status: New goal.) Lab Results  Component Value Date   HGBA1C 9.3 (H) 12/23/2020  Assessed patient's understanding of A1c goal: <7% Provided education to patient about basic DM disease process; Reviewed medications with patient and discussed importance of medication adherence.  Patient not taking numerous medications ordered for DM II due to financial difficulties obtaining numerous medications.  03/17/2021:  Patient not taking any of prescribed medications except insulin and medication at bedtime for sleep.  Plan to schedule PCP office visit per patient's request to be assessed and determine what all medications are for.    Counseled on importance of regular laboratory monitoring as prescribed;        Discussed plans with patient for ongoing care management follow up and provided patient with direct contact information for care management team;      Reviewed scheduled/upcoming provider appointments including: No upcoming scheduled PCP appointments at this time.  [place] to work on [employer] PCP office appointment as soon as possible.        Advised patient, providing education and rationale, to check cbg 3 times daily and record.  03/17/2021: Patient continues to not have a glucometer and is in need of one.  I placed a phone call to [place] where PCP order for glucometer was sent.  [facility] informed me that the pharmacist has spoken with patient 3 different times and informed her that the co-pay for the meter is $7.50.  I communicated this information to the patient.    Call provider for findings outside established parameters;       Pharmacy team working with patient for assistance with medication management for complex medication regime.      Social work team working with patient for assistance with obtaining medications due to financial issues;      Review of patient status, including review of consultants reports,  relevant laboratory and other test results, and medications completed       Assessed social determinant of health barriers         Hyperlipidemia:  (Status: New goal.) Lab Results  Component Value Date   CHOL 225 (H) 12/23/2020   HDL 59 12/23/2020   LDLCALC 150 (H) 12/23/2020   TRIG 90 12/23/2020   CHOLHDL 3.8 12/23/2020     Medication review performed; medication list updated in electronic medical record.  Counseled on importance of regular laboratory monitoring as prescribed Provided HLD educational materials.  03/17/2021: Will resent HLD educational information in the mail now that we obtained patient's correct address. Reviewed role and benefits of statin for ASCVD risk reduction; Reviewed importance of limiting foods high in cholesterol Assessed social determinant of health barriers  Hypertension: (Status: New goal.) Last practice recorded BP readings:  BP Readings from Last 3 Encounters:  02/11/21 96/67  02/10/21 (!) 142/79  04/23/20 105/78    Most recent eGFR/CrCl:  Lab Results  Component Value Date   EGFR 101 12/23/2020    No components found for: CRCL  Evaluation of current treatment plan related to hypertension self management and patient's adherence to plan as established by provider;   Provided education to patient re: stroke prevention, s/s of heart attack and stroke; Reviewed prescribed diet of low sodium and low cholesterol Reviewed medications with patient and discussed importance of compliance;  Provided assistance with obtaining home blood pressure monitor via community resources.  Patient does not have blood pressure monitor currently and needs to obtain one.   I placed a phone call to [place] where PCP order for blood pressure monitor was sent.  [facility] informed me that the pharmacist has spoken with patient 3 different times and informed her that the blood pressure cuff is free and can be picked up anytime.  I communicated this information to  the patient.  Discussed plans with patient for ongoing care management follow up and provided patient with direct contact information for care management team;  Patient Goals/Self-Care Activities: Patient will self administer medications as prescribed Patient will attend all scheduled provider appointments Patient will call pharmacy for medication refills Patient will call provider office for new concerns or questions Patient will work with BSW to address care coordination needs and will continue to work with the clinical team to address health care and disease management related needs.         Follow Up:  Patient agrees to Care Plan and Follow-up.  Plan: The Managed Medicaid care management team will reach out to the patient again over the next 14 days.  Date/time of next scheduled RN care management/care coordination outreach:  03/31/2021 at 1:00pm  Salvatore Marvel RN, BSN [facility] Coordinator [facility] Mobile: [phone]

## 2021-03-17 NOTE — Telephone Encounter (Signed)
..   Medicaid Managed Care   Unsuccessful Outreach Note  03/17/2021 Name: Robin Montgomery MRN: [id number] DOB: [birth year]  Referred by: Claiborne Rigg, NP Reason for referral : High Risk Managed Medicaid (I called the patient today to reschedule her phone appt with the [facility] RNCM. I left my name and number on her VM.)   An unsuccessful telephone outreach was attempted today. The patient was referred to the case management team for assistance with care management and care coordination.   Follow Up Plan: The care management team will reach out to the patient again over the next 7 days.   Weston Settle Care Guide, High Risk Medicaid Managed Care Embedded Care Coordination [facility]  [facility]

## 2021-03-17 NOTE — Patient Instructions (Signed)
Visit Information  Robin Montgomery was given information about Medicaid Managed Care team care coordination services as a part of their [facility] Community Plan Medicaid benefit. Robin Montgomery verbally consented to engagement with the [facility] Managed Care team.   If you are experiencing a medical emergency, please call 911 or report to your local emergency department or urgent care.   If you have a non-emergency medical problem during routine business hours, please contact your provider's office and ask to speak with a nurse.   For questions related to your [facility], please call: [phone] or visit the homepage here: [email or web address]  If you would like to schedule transportation through your [facility], please call the following number at least 2 days in advance of your appointment: [phone].   Call the Behavioral Health Crisis Line at [phone], at any time, 24 hours a day, 7 days a week. If you are in danger or need immediate medical attention call 911.  If you would like help to quit smoking, call 1-800-QUIT-NOW ((450)565-5386) OR Espaol: 1-855-Djelo-Ya (2-725-366-4403) o para ms informacin haga clic aqu or Text READY to 474-259 to register via text  Robin Montgomery - following are the goals we discussed in your visit today:   Goals Addressed             This Visit's Progress    HEMOGLOBIN A1C < 7.0       Timeframe:  Long-Range Goal Priority:  High Start Date:     01/19/2021                        Expected End Date:    05/19/2021     Patient Goals: Patient will self administer medications as prescribed Patient will attend all scheduled provider appointments Patient will call pharmacy for medication refills Patient will call provider office for new concerns or questions Patient will work with BSW to address care coordination needs and will continue to  work with the clinical team to address health care and disease management related needs.       RNCM Hypertension Monitored & Managed       Timeframe:  Long-Range Goal Priority:  High Start Date:  01/19/2021                           Expected End Date:    05/19/2021     Patient Goals: Patient will self administer medications as prescribed Patient will attend all scheduled provider appointments Patient will call pharmacy for medication refills Patient will call provider office for new concerns or questions Patient will work with BSW to address care coordination needs and will continue to work with the clinical team to address health care and disease management related needs.       RNCM: Management of COPD       Timeframe:  Long-Range Goal Priority:  High Start Date:      01/19/2021                       Expected End Date:  05/19/2021  Patient's Goals: Patient will self administer medications as prescribed Patient will attend all scheduled provider appointments Patient will call pharmacy for medication refills Patient will call provider office for new concerns or questions Patient will work with BSW to address care coordination needs and will continue to work with the clinical team to address health  care and disease management related needs. 05/19/2021                       RNCM: Monitor &  Manage HLD       Timeframe:  Long-Range Goal Priority:  High Start Date:  01/19/2021                           Expected End Date:    05/19/2021                    Patient Goals: Patient will self administer medications as prescribed Patient will attend all scheduled provider appointments Patient will call pharmacy for medication refills Patient will call provider office for new concerns or questions Patient will work with BSW to address care coordination needs and will continue to work with the clinical team to address health care and disease management related needs.         Please see  education materials related to HLD provided as print materials.   The patient verbalized understanding of instructions provided today and agreed to receive a mailed copy of patient instruction and/or educational materials.  The Managed Medicaid care management team will reach out to the patient again over the next 14 days.   Virgina Norfolk RN, BSN Community Care Coordinator [facility]  [facility] Mobile: [phone]

## 2021-03-23 DIAGNOSIS — Z1152 Encounter for screening for COVID-19: Secondary | ICD-10-CM | POA: Diagnosis not present

## 2021-03-30 ENCOUNTER — Ambulatory Visit: Payer: Self-pay

## 2021-03-31 ENCOUNTER — Telehealth: Payer: Self-pay | Admitting: Pharmacist

## 2021-03-31 ENCOUNTER — Other Ambulatory Visit: Payer: Self-pay

## 2021-03-31 NOTE — Telephone Encounter (Signed)
Created in error

## 2021-03-31 NOTE — Patient Outreach (Signed)
03/30/2021 Name: Robin Montgomery MRN: [id number] DOB: 10-21-1960  Referred by: Claiborne Rigg, NP Reason for referral : No chief complaint on file.   Patient asked to reschedule appointment.  Follow Up Plan: Appointment rescheduled to next available time slot on November 18th.  Cheral Almas PharmD, CPP High Risk Managed Medicaid [place] [phone]

## 2021-03-31 NOTE — Patient Outreach (Signed)
Care Coordination  03/31/2021  ELENORE WANNINGER 02-21-1961 [id number]  03/31/2021 Name: MAELEIGH BUSCHMAN MRN: [id number] DOB: 26-Aug-1960  Referred by: Claiborne Rigg, NP Reason for referral : High Risk Managed Medicaid (RNCM Visit for High Risk Managed Medicaid Chronic Disease Management and Care Coordination Needs)   This RN Case Manager placed a telephone call to patient.  Patient answered the telephone but she was sleeping and groggy at the time of the telephone visit.  The patient requested we reschedule this visit to next week.  Patient stated she was doing ok. I agreed with this plan to reschedule this follow up telephone visit to next week  Follow Up Plan: The Managed Medicaid care management team will reach out to the patient again over the next 7 days.  Rescheduled today's visit for next Wednesday,04/07/2021 at 3:30 pm.   Virgina Norfolk RN, BSN [facility] Coordinator [facility]  [facility] Mobile: [phone]

## 2021-04-07 ENCOUNTER — Other Ambulatory Visit: Payer: Self-pay

## 2021-04-07 NOTE — Patient Instructions (Signed)
04/07/2021 Name: ARACELI ARANGO MRN: [id number] DOB: 02-13-1961  Referred by: Claiborne Rigg, NP Reason for referral : High Risk Managed Medicaid (Unsuccessful Telephone Outreach)   An unsuccessful telephone outreach was attempted today. The patient was referred to the case management team for assistance with care management and care coordination.    Follow Up Plan: A HIPAA compliant phone message was left for the patient providing contact information and requesting a return call. and The Managed Medicaid care management team will reach out to the patient again over the next 10 days.    Virgina Norfolk RN, BSN Community Care Coordinator [facility]  [facility] Mobile: [phone]

## 2021-04-07 NOTE — Patient Outreach (Signed)
Care Coordination  04/07/2021  CATLIN DORIA 1960/10/05 [id number]  04/07/2021 Name: Robin Montgomery MRN: [id number] DOB: [birth year]  Referred by: Claiborne Rigg, NP Reason for referral : High Risk Managed Medicaid (Unsuccessful Telephone Outreach)   An unsuccessful telephone outreach was attempted today. The patient was referred to the case management team for assistance with care management and care coordination.    Follow Up Plan: A HIPAA compliant phone message was left for the patient providing contact information and requesting a return call. and The Managed Medicaid care management team will reach out to the patient again over the next 10 days.    Virgina Norfolk RN, BSN Community Care Coordinator [facility]  [facility] Mobile: [phone]

## 2021-04-13 ENCOUNTER — Other Ambulatory Visit: Payer: Self-pay | Admitting: Nurse Practitioner

## 2021-04-13 DIAGNOSIS — I10 Essential (primary) hypertension: Secondary | ICD-10-CM

## 2021-04-13 NOTE — Telephone Encounter (Signed)
Requested medications are due for refill today.  Unknown  Requested medications are on the active medications list.  yes  Last refill. 01/28/2021 for both #90  0 refills  Future visit scheduled.   no  Notes to clinic.  Per note of 03/17/2021 pt is not taking these medications.

## 2021-04-16 ENCOUNTER — Other Ambulatory Visit: Payer: Self-pay | Admitting: Nurse Practitioner

## 2021-04-16 DIAGNOSIS — I10 Essential (primary) hypertension: Secondary | ICD-10-CM

## 2021-04-17 NOTE — Telephone Encounter (Signed)
Patient called and advised of the refill request and the last time it was refused due to she needs an appointment, she verbalized understanding. Appointment scheduled for first available on Monday 06/28/20 at 1550 with Bertram Denver, NP. Patient says she has some medication left, but not enough to last until appointment. Advised a courtesy 30 day supply will be sent to last until the appointment, she verbalized understanding.

## 2021-04-17 NOTE — Telephone Encounter (Signed)
Requested Prescriptions  Pending Prescriptions Disp Refills  . lisinopril (ZESTRIL) 40 MG tablet [Pharmacy Med Name: lisinopril 40 mg tablet] 30 tablet 0    Sig: Take 1 tablet (40 mg total) by mouth every morning. OFFICE VISIT NEEDED FOR ADDITIONAL REFILLS, KEEP UPCOMING APPOINTMENT     Cardiovascular:  ACE Inhibitors Failed - 04/16/2021  9:32 AM      Failed - K in normal range and within 180 days    Potassium  Date Value Ref Range Status  02/11/2021 3.3 (L) 3.5 - 5.1 mmol/L Final         Failed - Valid encounter within last 6 months    Recent Outpatient Visits          6 months ago Diabetes mellitus type 2, insulin dependent [facility])   [place] [facility] [place], Shea Stakes, NP   1 year ago Encounter for Papanicolaou smear for cervical cancer screening   [facility] [place], [place] W, NP   1 year ago Type 2 diabetes mellitus with hyperglycemia, with long-term current use of insulin [facility])   [place] [facility] [place], [place] W, NP   1 year ago Type 2 diabetes mellitus with hyperglycemia, with long-term current use of insulin [facility])   [facility] [place], [place] W, NP   2 years ago Poorly controlled type 2 diabetes mellitus with complication [facility])   [facility] [place], Shea Stakes, NP      Future Appointments            In 2 months Claiborne Rigg, NP [facility]           Passed - Cr in normal range and within 180 days    Creatinine, Ser  Date Value Ref Range Status  02/11/2021 0.62 0.44 - 1.00 mg/dL Final   Creatinine, Urine  Date Value Ref Range Status  11/17/2015 114 20 - 320 mg/dL Final         Passed - Patient is not pregnant      Passed - Last BP in normal range    BP Readings from Last 1 Encounters:  02/11/21 96/67

## 2021-04-23 ENCOUNTER — Ambulatory Visit: Payer: Self-pay

## 2021-04-23 ENCOUNTER — Telehealth: Payer: Self-pay | Admitting: Pharmacist

## 2021-04-23 NOTE — Patient Outreach (Signed)
04/23/2021 Name: Robin Montgomery MRN: [id number] DOB: [birth year]  Referred by: Claiborne Rigg, NP Reason for referral : No chief complaint on file.   Spoke with patient briefly on phone and she stated she did not have time to talk as she was tired. Patient asked to reschedule appointment.   Follow Up Plan: Appointment rescheduled for 12/20 at 9:00 AM    Cheral Almas PharmD, CPP High Risk Managed [facility] [phone]

## 2021-04-27 ENCOUNTER — Telehealth (HOSPITAL_COMMUNITY): Payer: Self-pay | Admitting: *Deleted

## 2021-04-27 NOTE — Telephone Encounter (Signed)
Prior Authorization for Austedo 12 mg Tablet Have been working on Movement Disorder Prior Authorization & it was Denied. Provider Notified

## 2021-05-07 ENCOUNTER — Telehealth: Payer: Self-pay | Admitting: Nurse Practitioner

## 2021-05-07 NOTE — Telephone Encounter (Signed)
..   Medicaid Managed Care   Unsuccessful Outreach Note  05/07/2021 Name: Robin Montgomery MRN: [id number] DOB: September 14, 1960  Referred by: Claiborne Rigg, NP Reason for referral : High Risk Managed Medicaid (I called the patient today to get her phone visit with the MM RNCM rescheduled. I left a message on her VM.)   A second unsuccessful telephone outreach was attempted today. The patient was referred to the case management team for assistance with care management and care coordination.   Follow Up Plan: The care management team will reach out to the patient again over the next 7 days.   Weston Settle Care Guide, High Risk Medicaid Managed Care Embedded Care Coordination [facility]  [facility]

## 2021-05-11 ENCOUNTER — Telehealth: Payer: Self-pay | Admitting: Nurse Practitioner

## 2021-05-11 NOTE — Telephone Encounter (Signed)
..   Medicaid Managed Care   Unsuccessful Outreach Note  05/11/2021 Name: Robin Montgomery MRN: [id number] DOB: [birth year]  Referred by: Claiborne Rigg, NP Reason for referral : High Risk Managed Medicaid (I called the patient today to get her phone visit with the [facility] RNCM rescheduled. I left my name and number on her VM.)   A second unsuccessful telephone outreach was attempted today. The patient was referred to the case management team for assistance with care management and care coordination.   Follow Up Plan: The care management team will reach out to the patient again over the next 7 days.   Weston Settle Care Guide, High Risk Medicaid Managed Care Embedded Care Coordination [facility]  [facility]

## 2021-05-13 ENCOUNTER — Other Ambulatory Visit: Payer: Self-pay | Admitting: Nurse Practitioner

## 2021-05-13 DIAGNOSIS — K219 Gastro-esophageal reflux disease without esophagitis: Secondary | ICD-10-CM

## 2021-05-20 ENCOUNTER — Other Ambulatory Visit: Payer: Self-pay

## 2021-05-20 NOTE — Patient Instructions (Signed)
Robin Montgomery ,   The [facility] Managed Care Team is available to provide assistance to you with your healthcare needs at no cost and as a benefit of your [facility] Health plan.  [name] informed this RN Care Manager that her insurance has changed from Managed Medicaid to straight Medicaid of [place].  Due to this change in insurance we are required to close the case effective today.  I spoke with the patient and informed her of case closure.  It has been a pleasure working with [name] regarding your health care needs. .   Thank you,   Virgina Norfolk RN, BSN [facility] Coordinator [facility]   [facility] Mobile: [phone]

## 2021-05-20 NOTE — Patient Outreach (Signed)
Medicaid Managed Care   Nurse Care Manager Note  05/20/2021 Name:  Robin Montgomery MRN:  [id number] DOB:  [birth year]  Robin Montgomery is an 60 y.o. year old female who is a primary patient of Gildardo Pounds, NP.  The [facility] Managed Care Coordination team was consulted for assistance with:    HTN HLD COPD DMII  Ms. Benefiel was given information about Medicaid Managed Care Coordination team services today. Robin Montgomery Patient agreed to services and verbal consent obtained.  Engaged with patient by telephone for follow up visit in response to provider referral for case management and/or care coordination services.   Assessments/Interventions:  Review of past medical history, allergies, medications, health status, including review of consultants reports, laboratory and other test data, was performed as part of comprehensive evaluation and provision of chronic care management services.  SDOH (Social Determinants of Health) assessments and interventions performed:   Care Plan  Allergies  Allergen Reactions   Aspirin Nausea And Vomiting    Upset stomach    Medications Reviewed Today     Reviewed by Inge Rise, RN (Case Manager) on 05/20/21 at 1212  Med List Status: <None>   Medication Order Taking? Sig Documenting Provider Last Dose Status Informant  Accu-Chek Softclix Lancets lancets [id number]  Use as instructed to check blood sugar twice daily. E11.65  Patient not taking: Reported on 03/17/2021   Gildardo Pounds, NP  Active   acetaminophen (TYLENOL) 500 MG tablet [id number]  Take 1 tablet (500 mg total) by mouth every 6 (six) hours as needed.  Patient not taking: Reported on 03/17/2021   Corena Herter, PA-C  Active   albuterol (VENTOLIN HFA) 108 (90 Base) MCG/ACT inhaler [id number] Yes INHALE 1 PUFF INTO THE LUNGS EVERY 6 HOURS AS NEEDED FOR WHEEZING OR SHORTNESS OF BREATH. Gildardo Pounds, NP Taking Active   amoxicillin (AMOXIL) 500 MG capsule [id number]   Take 1 capsule (500 mg total) by mouth 3 (three) times daily.  Patient not taking: No sig reported     Active            Med Note [facility], Kaushal Vannice A   Tue Jan 19, 2021  3:34 PM) Completed antibiotic  amoxicillin-clavulanate (AUGMENTIN) 875-125 MG tablet [id number]  Take 1 tablet by mouth 2 (two) times daily.  Patient not taking: Reported on 03/17/2021   Dorie Rank, MD  Active   ARIPiprazole (ABILIFY) 10 MG tablet [id number] Yes TAKE ONE TABLET BY MOUTH ONCE DAILY Salley Slaughter, NP Taking Active   atorvastatin (LIPITOR) 40 MG tablet [id number] Yes Take 1 tablet (40 mg total) by mouth daily. Gildardo Pounds, NP Taking Active   Blood Glucose Monitoring Suppl (ACCU-CHEK GUIDE ME) w/Device KIT [id number]  Use to check blood sugar twice daily. E11.65  Patient not taking: Reported on 03/17/2021   Gildardo Pounds, NP  Active   Blood Pressure Monitor DEVI [id number]  Please provide patient with insurance approved blood pressure monitor.  Patient not taking: Reported on 03/17/2021   Gildardo Pounds, NP  Active   budesonide-formoterol [facility]) 160-4.5 MCG/ACT inhaler [id number] Yes Inhale 2 puffs into the lungs 2 (two) times daily. Gildardo Pounds, NP Taking Active   Deutetrabenazine (AUSTEDO) 12 MG TABS [id number]  Take 36 mg by mouth daily. Eulis Canner E, NP  Active   glucose blood (ACCU-CHEK GUIDE) test strip [id number]  Use to check blood sugar twice daily. E11.65  Patient not taking: Reported on 03/17/2021   Raul Del,  Vernia Buff, NP  Active   insulin detemir (LEVEMIR FLEXTOUCH) 100 UNIT/ML FlexPen [id number]  Inject 30 Units into the skin 2 (two) times daily. Gildardo Pounds, NP  Expired 04/28/21 2359   Insulin Pen Needle (TECHLITE PEN NEEDLES) 31G X 5 MM MISC [id number]  use as instructed.inject into the skin once nightly Gildardo Pounds, NP  Active   lisinopril (ZESTRIL) 40 MG tablet [id number] Yes Take 1 tablet (40 mg total) by mouth every morning. OFFICE VISIT NEEDED FOR ADDITIONAL  REFILLS, KEEP UPCOMING APPOINTMENT Gildardo Pounds, NP Taking Active   metFORMIN (GLUCOPHAGE XR) 500 MG 24 hr tablet [id number] Yes Take 1 tablet (500 mg total) by mouth daily with breakfast. Gildardo Pounds, NP Taking Active   Misc. Devices [place]) MISC [id number] Yes Please provide patient with single cane. M70.62 Gildardo Pounds, NP Taking Active   naproxen (NAPROSYN) 375 MG tablet [id number] Yes Take 1 tablet (375 mg total) by mouth 2 (two) times daily. Dorie Rank, MD Taking Active   omeprazole (PRILOSEC) 40 MG capsule [id number] Yes TAKE ONE CAPSULE BY MOUTH EVERY EVENING Charlott Rakes, MD Taking Active   traMADol (ULTRAM) 50 MG tablet [id number]  take 1 tablet (50 mg) by oral route every 4-6 hours as needed  Patient not taking: No sig reported     Active            Med Note [facility], Kloie Whiting A   Tue Jan 19, 2021  3:38 PM) Patient does not have  traZODone (DESYREL) 50 MG tablet [id number] Yes Take 0.5 tablets (25 mg total) by mouth at bedtime as needed for sleep. Salley Slaughter, NP Taking Active             Patient Active Problem List   Diagnosis Date Noted   Left medial knee pain 07/31/2016   Essential hypertension 11/17/2015   Alcohol abuse    MDD (major depressive disorder), recurrent severe, without psychosis ([place]) 02/12/2015   Substance induced mood disorder ([place]) 02/12/2015   Polysubstance abuse ([place])    Wrist pain, chronic 12/22/2014   Trochanteric bursitis of left hip 12/22/2014   Seasonal allergies 09/12/2014   Tobacco dependence 09/12/2014   Shortness of breath 09/12/2014   Diabetes mellitus type 2, insulin dependent ([place]) 04/01/2014   Pain due to onychomycosis of toenail 04/01/2014   Cocaine abuse ([place]) 04/01/2014    Conditions to be addressed/monitored per PCP order:  HTN, HLD, COPD, and DMII  Follow Up:  Patient informed this RN Care Manager that her insurance plan has changed from Managed Medicaid to Medicaid of [place].  This change in the patient  insurance requires [place] to close the case effective today.  Plan: This RN Care Manager has informed the patient that the High Risk Managed Medicaid Team will close the case effective today due to change in patient's insurance.  Date/time of next scheduled RN care management/care coordination outreach:  None scheduled.  Salvatore Marvel RN, BSN Community Care Coordinator [facility] Mobile: [phone]

## 2021-05-25 ENCOUNTER — Ambulatory Visit: Payer: Medicaid Other

## 2021-06-06 ENCOUNTER — Other Ambulatory Visit: Payer: Self-pay | Admitting: Nurse Practitioner

## 2021-06-06 ENCOUNTER — Other Ambulatory Visit (HOSPITAL_COMMUNITY): Payer: Self-pay | Admitting: Psychiatry

## 2021-06-06 DIAGNOSIS — F17200 Nicotine dependence, unspecified, uncomplicated: Secondary | ICD-10-CM

## 2021-06-06 DIAGNOSIS — F1994 Other psychoactive substance use, unspecified with psychoactive substance-induced mood disorder: Secondary | ICD-10-CM

## 2021-06-06 DIAGNOSIS — J42 Unspecified chronic bronchitis: Secondary | ICD-10-CM

## 2021-06-17 ENCOUNTER — Other Ambulatory Visit: Payer: Self-pay | Admitting: Family Medicine

## 2021-06-17 ENCOUNTER — Other Ambulatory Visit: Payer: Self-pay | Admitting: Nurse Practitioner

## 2021-06-17 DIAGNOSIS — I10 Essential (primary) hypertension: Secondary | ICD-10-CM

## 2021-06-17 DIAGNOSIS — K219 Gastro-esophageal reflux disease without esophagitis: Secondary | ICD-10-CM

## 2021-06-17 NOTE — Telephone Encounter (Signed)
Requested Prescriptions  Pending Prescriptions Disp Refills   lisinopril (ZESTRIL) 40 MG tablet [Pharmacy Med Name: lisinopril 40 mg tablet] 30 tablet 0    Sig: TAKE ONE TABLET BY MOUTH EVERY MORNING     Cardiovascular:  ACE Inhibitors Failed - 06/17/2021  4:42 PM      Failed - K in normal range and within 180 days    Potassium  Date Value Ref Range Status  02/11/2021 3.3 (L) 3.5 - 5.1 mmol/L Final         Failed - Valid encounter within last 6 months    Recent Outpatient Visits          8 months ago Diabetes mellitus type 2, insulin dependent [facility])   [place] [facility] [place], Shea Stakes, NP   1 year ago Encounter for Papanicolaou smear for cervical cancer screening   [facility] [place], [place] W, NP   1 year ago Type 2 diabetes mellitus with hyperglycemia, with long-term current use of insulin [facility])   [place] [facility] [place], [place] W, NP   1 year ago Type 2 diabetes mellitus with hyperglycemia, with long-term current use of insulin [facility])   [place] [facility] [place], [place] W, NP   2 years ago Poorly controlled type 2 diabetes mellitus with complication [facility])   [place] [facility] [place], Shea Stakes, NP      Future Appointments            In 4 days Claiborne Rigg, NP [facility] [facility]           Passed - Cr in normal range and within 180 days    Creatinine, Ser  Date Value Ref Range Status  02/11/2021 0.62 0.44 - 1.00 mg/dL Final   Creatinine, Urine  Date Value Ref Range Status  11/17/2015 114 20 - 320 mg/dL Final         Passed - Patient is not pregnant      Passed - Last BP in normal range    BP Readings from Last 1 Encounters:  02/11/21 96/67

## 2021-06-17 NOTE — Telephone Encounter (Signed)
Requested Prescriptions  Pending Prescriptions Disp Refills   omeprazole (PRILOSEC) 40 MG capsule [Pharmacy Med Name: omeprazole 40 mg capsule,delayed release] 30 capsule 0    Sig: TAKE ONE CAPSULE BY MOUTH EVERY EVENING     Gastroenterology: Proton Pump Inhibitors Passed - 06/17/2021  4:42 PM      Passed - Valid encounter within last 12 months    Recent Outpatient Visits          8 months ago Diabetes mellitus type 2, insulin dependent ([place])   [place], Vernia Buff, NP   1 year ago Encounter for Papanicolaou smear for cervical cancer screening   [place], [place] W, NP   1 year ago Type 2 diabetes mellitus with hyperglycemia, with long-term current use of insulin [facility])   [place], [place] W, NP   1 year ago Type 2 diabetes mellitus with hyperglycemia, with long-term current use of insulin [facility])   [place], [place] W, NP   2 years ago Poorly controlled type 2 diabetes mellitus with complication [facility])   [place], Vernia Buff, NP      Future Appointments            In 4 days Gildardo Pounds, NP [place]

## 2021-06-21 ENCOUNTER — Ambulatory Visit: Payer: Medicaid Other | Attending: Nurse Practitioner | Admitting: Nurse Practitioner

## 2021-06-21 ENCOUNTER — Encounter: Payer: Self-pay | Admitting: Nurse Practitioner

## 2021-06-21 ENCOUNTER — Other Ambulatory Visit: Payer: Self-pay

## 2021-06-21 DIAGNOSIS — E1165 Type 2 diabetes mellitus with hyperglycemia: Secondary | ICD-10-CM

## 2021-06-21 DIAGNOSIS — E782 Mixed hyperlipidemia: Secondary | ICD-10-CM | POA: Diagnosis not present

## 2021-06-21 DIAGNOSIS — I10 Essential (primary) hypertension: Secondary | ICD-10-CM

## 2021-06-21 DIAGNOSIS — K219 Gastro-esophageal reflux disease without esophagitis: Secondary | ICD-10-CM

## 2021-06-21 DIAGNOSIS — Z1211 Encounter for screening for malignant neoplasm of colon: Secondary | ICD-10-CM

## 2021-06-21 DIAGNOSIS — Z114 Encounter for screening for human immunodeficiency virus [HIV]: Secondary | ICD-10-CM

## 2021-06-21 DIAGNOSIS — N760 Acute vaginitis: Secondary | ICD-10-CM

## 2021-06-21 DIAGNOSIS — Z1231 Encounter for screening mammogram for malignant neoplasm of breast: Secondary | ICD-10-CM

## 2021-06-21 DIAGNOSIS — Z794 Long term (current) use of insulin: Secondary | ICD-10-CM

## 2021-06-21 DIAGNOSIS — D649 Anemia, unspecified: Secondary | ICD-10-CM

## 2021-06-21 MED ORDER — OMEPRAZOLE 40 MG PO CPDR: 40.0000 mg | DELAYED_RELEASE_CAPSULE | Freq: Every evening | ORAL | 1 refills | Status: DC

## 2021-06-21 MED ORDER — LEVEMIR FLEXTOUCH 100 UNIT/ML ~~LOC~~ SOPN: 30.0000 [IU] | PEN_INJECTOR | Freq: Two times a day (BID) | SUBCUTANEOUS | 1 refills | Status: DC

## 2021-06-21 MED ORDER — LISINOPRIL 40 MG PO TABS: 40.0000 mg | ORAL_TABLET | Freq: Every morning | ORAL | 0 refills | Status: DC

## 2021-06-21 MED ORDER — METFORMIN HCL ER 500 MG PO TB24: 500.0000 mg | ORAL_TABLET | Freq: Every day | ORAL | 0 refills | Status: DC

## 2021-06-21 MED ORDER — ACCU-CHEK GUIDE VI STRP: ORAL_STRIP | 12 refills | Status: DC

## 2021-06-21 MED ORDER — ATORVASTATIN CALCIUM 40 MG PO TABS: 40.0000 mg | ORAL_TABLET | Freq: Every day | ORAL | 3 refills | Status: DC

## 2021-06-21 NOTE — Progress Notes (Signed)
Virtual Visit via Telephone Note Due to national recommendations of social distancing due to [place] 19, telehealth visit is felt to be most appropriate for this patient at this time.  I discussed the limitations, risks, security and privacy concerns of performing an evaluation and management service by telephone and the availability of in person appointments. I also discussed with the patient that there may be a patient responsible charge related to this service. The patient expressed understanding and agreed to proceed.    I connected with Robin Montgomery on 06/21/21  at   3:50 PM EST  EDT by telephone and verified that I am speaking with the correct person using two identifiers.  Location of Patient: Private Residence   Location of Provider: [place] and [employer] Office    Persons participating in Telemedicine visit: [name] FNP-BC Robin Montgomery    History of Present Illness: Telemedicine visit for: HTN She has a past medical history of Depression, DM 2, and Hypertension.   HTN She has not been compliant with most of her medications. States she does not know what the medications were for however when she was contacted by the pharmacist FOR MED REVIEW she stated she was tired and did not have time to talk. She was not very engaged during tele visit today and only gave one word answers. When I inquired as to any concerns she stated "I just need come into the office". BP Readings from Last 3 Encounters:  02/11/21 96/67  02/10/21 (!) 142/79  04/23/20 105/78    DM Poorly controlled. Unclear if she is taking levemir as prescribed as prescription looks to be expired.  Lab Results  Component Value Date   HGBA1C 9.3 (H) 12/23/2020    Past Medical History:  Diagnosis Date   Depression    Diabetes mellitus without complication ([place])    Hypertension     Past Surgical History:  Procedure Laterality Date   CHOLECYSTECTOMY      History reviewed. No pertinent  family history.  Social History   Socioeconomic History   Marital status: Single    Spouse name: Not on file   Number of children: Not on file   Years of education: Not on file   Highest education level: Not on file  Occupational History   Not on file  Tobacco Use   Smoking status: Every Day    Packs/day: 0.25    Years: 48.00    Pack years: 12.00    Types: Cigarettes    Start date: 48   Smokeless tobacco: Former    Types: Snuff   Tobacco comments:    Patient states she is trying to decrease smoking.  States she smokes 4 - 5 cigarettes daily  Vaping Use   Vaping Use: Never used  Substance and Sexual Activity   Alcohol use: Yes    Alcohol/week: 0.0 standard drinks    Comment: every other week    Drug use: Yes    Types: Cocaine, Marijuana   Sexual activity: Not on file  Other Topics Concern   Not on file  Social History Narrative   Smoker   Cocaine use off and on for past 20 years   Living in motel   Living with 61 yo and 58-something year old child    Social Determinants of Health   Financial Resource Strain: Medium Risk   Difficulty of Paying Living Expenses: Somewhat hard  Food Insecurity: Not on file  Transportation Needs: Not on file  Physical  Activity: Not on file  Stress: Stress Concern Present   Feeling of Stress : To some extent  Social Connections: Not on file     Observations/Objective: Awake, alert and oriented x 3   Review of Systems  Constitutional:  Negative for fever, malaise/fatigue and weight loss.  HENT: Negative.  Negative for nosebleeds.   Eyes: Negative.  Negative for blurred vision, double vision and photophobia.  Respiratory: Negative.  Negative for cough and shortness of breath.   Cardiovascular: Negative.  Negative for chest pain, palpitations and leg swelling.  Gastrointestinal:  Positive for heartburn. Negative for nausea and vomiting.  Genitourinary:        Increased vaginal discharge   Musculoskeletal: Negative.  Negative  for myalgias.  Neurological: Negative.  Negative for dizziness, focal weakness, seizures and headaches.  Psychiatric/Behavioral:  Positive for depression. Negative for memory loss and suicidal ideas. The patient is nervous/anxious and has insomnia.    Assessment and Plan: Diagnoses and all orders for this visit:  Essential hypertension -     CMP14+EGFR; Future -     lisinopril (ZESTRIL) 40 MG tablet; Take 1 tablet (40 mg total) by mouth every morning. FOR BLOOD PRESSURE  Type 2 diabetes mellitus with hyperglycemia, with long-term current use of insulin (HCC) -     Hemoglobin A1c; Future -     insulin detemir (LEVEMIR FLEXTOUCH) 100 UNIT/ML FlexPen; Inject 30 Units into the skin 2 (two) times daily. For diabetes -     metFORMIN (GLUCOPHAGE XR) 500 MG 24 hr tablet; Take 1 tablet (500 mg total) by mouth daily with breakfast. FOR DIABETES -     glucose blood (ACCU-CHEK GUIDE) test strip; Use to check blood sugar twice daily. E11.65  Mixed hyperlipidemia -     Lipid panel; Future -     atorvastatin (LIPITOR) 40 MG tablet; Take 1 tablet (40 mg total) by mouth daily. FOR CHOLESTEROL  Breast cancer screening by mammogram -     MM 3D SCREEN BREAST BILATERAL; Future  Colon cancer screening -     Ambulatory referral to Gastroenterology  Anemia, unspecified type -     CBC; Future  Acute vaginitis -     Cervicovaginal ancillary only; Future  Encounter for screening for HIV -     HIV antibody (with reflex); Future  GERD without esophagitis -     omeprazole (PRILOSEC) 40 MG capsule; Take 1 capsule (40 mg total) by mouth every evening. FOR ACID REFLUX     Follow Up Instructions Return in about 3 months (around 09/19/2021).     I discussed the assessment and treatment plan with the patient. The patient was provided an opportunity to ask questions and all were answered. The patient agreed with the plan and demonstrated an understanding of the instructions.   The patient was advised to call  back or seek an in-person evaluation if the symptoms worsen or if the condition fails to improve as anticipated.  I provided 11 minutes of non-face-to-face time during this encounter including median intraservice time, reviewing previous notes, labs, imaging, medications and explaining diagnosis and management.  Gildardo Pounds, FNP-BC

## 2021-06-22 ENCOUNTER — Telehealth (HOSPITAL_COMMUNITY): Payer: Self-pay

## 2021-06-22 NOTE — Telephone Encounter (Signed)
RECEIVED A FAX FROM [facility] REQUESTING A PA ON PATIENT'S ARIPIPRAZOLE 10MG  TABLET. WRITER S/W LISA AT [place] TRACKS TO DO THE PA BUT WRITER COULDN'T DO IT. LISA STATED THAT THERE IS SOMETHING GOING ON WITH THE PATIENT'S COVERAGE AND THAT THE PT NEEDS TO CONTACT HER CASE WORKER AT [phone].  WRITER CALLED PT TO RELAY THE MESSAGE BUT SHE DIDN'T PICK UP THE PHONE - WRITER LVM

## 2021-07-01 ENCOUNTER — Encounter: Payer: Self-pay | Admitting: Nurse Practitioner

## 2021-07-14 ENCOUNTER — Telehealth: Payer: Self-pay | Admitting: Nurse Practitioner

## 2021-07-14 NOTE — Telephone Encounter (Signed)
Caller name Gwynne   Relation to pt: from Dr. Diona Browner  Call back number: [phone] fax # [phone]    Reason for call:  Repeat A1 C and then will proceed extraction surgery, caller states PCP is aware A1C was abnormal. Requesting patient repeat. Caller would like a follow up call regarding status

## 2021-07-15 ENCOUNTER — Telehealth: Payer: Self-pay

## 2021-07-15 NOTE — Telephone Encounter (Signed)
Called and made pt an appt for labs

## 2021-07-21 ENCOUNTER — Other Ambulatory Visit (HOSPITAL_COMMUNITY)
Admission: RE | Admit: 2021-07-21 | Discharge: 2021-07-21 | Disposition: A | Discharge status: Home / Self Care | Payer: Medicaid Other | Source: Ambulatory Visit | Attending: Nurse Practitioner | Admitting: Nurse Practitioner

## 2021-07-21 ENCOUNTER — Ambulatory Visit: Payer: Medicaid Other | Attending: Nurse Practitioner

## 2021-07-21 DIAGNOSIS — N760 Acute vaginitis: Secondary | ICD-10-CM | POA: Insufficient documentation

## 2021-07-21 DIAGNOSIS — I10 Essential (primary) hypertension: Secondary | ICD-10-CM

## 2021-07-21 DIAGNOSIS — E782 Mixed hyperlipidemia: Secondary | ICD-10-CM

## 2021-07-21 DIAGNOSIS — E1165 Type 2 diabetes mellitus with hyperglycemia: Secondary | ICD-10-CM

## 2021-07-21 DIAGNOSIS — Z114 Encounter for screening for human immunodeficiency virus [HIV]: Secondary | ICD-10-CM

## 2021-07-21 DIAGNOSIS — D649 Anemia, unspecified: Secondary | ICD-10-CM

## 2021-07-22 ENCOUNTER — Other Ambulatory Visit: Payer: Self-pay | Admitting: Nurse Practitioner

## 2021-07-22 LAB — CMP14+EGFR
ALT: 20 IU/L (ref 0–32)
AST: 11 IU/L (ref 0–40)
Albumin/Globulin Ratio: 2 (ref 1.2–2.2)
Albumin: 4.7 g/dL (ref 3.8–4.9)
Alkaline Phosphatase: 100 IU/L (ref 44–121)
BUN/Creatinine Ratio: 25 (ref 12–28)
BUN: 16 mg/dL (ref 8–27)
Bilirubin Total: <0.2 mg/dL (ref 0.0–1.2)
CO2: 21 mmol/L (ref 20–29)
Calcium: 9.7 mg/dL (ref 8.7–10.3)
Chloride: 104 mmol/L (ref 96–106)
Creatinine, Ser: 0.65 mg/dL (ref 0.57–1.00)
Globulin, Total: 2.4 g/dL (ref 1.5–4.5)
Glucose: 284 mg/dL — ABNORMAL HIGH (ref 70–99)
Potassium: 4.7 mmol/L (ref 3.5–5.2)
Sodium: 139 mmol/L (ref 134–144)
Total Protein: 7.1 g/dL (ref 6.0–8.5)
eGFR: 101 mL/min/{1.73_m2} (ref >59)

## 2021-07-22 LAB — LIPID PANEL
Chol/HDL Ratio: 3.2 ratio (ref 0.0–4.4)
Cholesterol, Total: 229 mg/dL — ABNORMAL HIGH (ref 100–199)
HDL: 72 mg/dL (ref >39)
LDL Chol Calc (NIH): 135 mg/dL — ABNORMAL HIGH (ref 0–99)
Triglycerides: 128 mg/dL (ref 0–149)
VLDL Cholesterol Cal: 22 mg/dL (ref 5–40)

## 2021-07-22 LAB — HEMOGLOBIN A1C
Est. average glucose Bld gHb Est-mCnc: 214 mg/dL
Hgb A1c MFr Bld: 9.1 % — ABNORMAL HIGH (ref 4.8–5.6)

## 2021-07-22 LAB — CERVICOVAGINAL ANCILLARY ONLY
Bacterial Vaginitis (gardnerella): POSITIVE — AB
Candida Glabrata: NEGATIVE
Candida Vaginitis: NEGATIVE
Chlamydia: NEGATIVE
Comment: NEGATIVE
Comment: NEGATIVE
Comment: NEGATIVE
Comment: NEGATIVE
Comment: NEGATIVE
Comment: NORMAL
Neisseria Gonorrhea: NEGATIVE
Trichomonas: NEGATIVE

## 2021-07-22 LAB — CBC
Hematocrit: 39.1 % (ref 34.0–46.6)
Hemoglobin: 13.1 g/dL (ref 11.1–15.9)
MCH: 32.2 pg (ref 26.6–33.0)
MCHC: 33.5 g/dL (ref 31.5–35.7)
MCV: 96 fL (ref 79–97)
Platelets: 418 10*3/uL (ref 150–450)
RBC: 4.07 x10E6/uL (ref 3.77–5.28)
RDW: 12.3 % (ref 11.7–15.4)
WBC: 7.7 10*3/uL (ref 3.4–10.8)

## 2021-07-22 LAB — HIV ANTIBODY (ROUTINE TESTING W REFLEX): HIV Screen 4th Generation wRfx: NONREACTIVE

## 2021-07-22 MED ORDER — METRONIDAZOLE 500 MG PO TABS: 500.0000 mg | ORAL_TABLET | Freq: Two times a day (BID) | ORAL | 0 refills | Status: AC

## 2021-07-23 ENCOUNTER — Telehealth: Payer: Self-pay

## 2021-07-23 NOTE — Telephone Encounter (Signed)
Pt was called and is aware of results, DOB was confirmed.  ?

## 2021-07-23 NOTE — Telephone Encounter (Signed)
-----   Message from Claiborne Rigg, NP sent at 07/22/2021  8:52 PM EST ----- Flagyl sent for bacterial vaginosis

## 2021-08-19 ENCOUNTER — Telehealth (HOSPITAL_COMMUNITY): Payer: Self-pay | Admitting: *Deleted

## 2021-08-19 NOTE — Telephone Encounter (Signed)
FAX FROM [facility] REQUESTING A PA ON PATIENT'S ARIPIPRAZOLE 10MG  TABLET. S/W  [place] TRACKS TO DO THE PA BUT COULDN'T DO IT. ?THERE IS SOMETHING GOING ON WITH THE PATIENT'S COVERAGE AND THAT THE PT NEEDS TO CONTACT HER CASE WORKER AT [phone].  ?CALLED PT TO RELAY THE MESSAGE BUT SHE DIDN'T PICK UP THE PHONE - LVM ?

## 2021-08-25 ENCOUNTER — Ambulatory Visit: Payer: Medicaid Other | Admitting: Nurse Practitioner

## 2021-08-31 ENCOUNTER — Telehealth (HOSPITAL_COMMUNITY): Payer: Self-pay | Admitting: *Deleted

## 2021-08-31 NOTE — Telephone Encounter (Signed)
[place] TRACKS -[place] MEDICAID & [place] CHOICE PHARMACY PRIOR APPROVAL REQUEST FOR MOVEMENT DISORDER: AUSTEDO ? ?Deutetrabenazine (AUSTEDO) 12 MG TABS ?Take 36 mg by mouth daily  ? ? ?P.A. # [id number] ? ?EFFECTIVE: 08/27/2021  TO  08/22/2022 ?

## 2021-09-07 ENCOUNTER — Other Ambulatory Visit: Payer: Self-pay | Admitting: Nurse Practitioner

## 2021-09-07 DIAGNOSIS — F17200 Nicotine dependence, unspecified, uncomplicated: Secondary | ICD-10-CM

## 2021-09-07 DIAGNOSIS — J42 Unspecified chronic bronchitis: Secondary | ICD-10-CM

## 2021-09-10 ENCOUNTER — Other Ambulatory Visit: Payer: Self-pay | Admitting: Nurse Practitioner

## 2021-09-10 ENCOUNTER — Other Ambulatory Visit: Payer: Self-pay | Admitting: Family Medicine

## 2021-09-10 DIAGNOSIS — J42 Unspecified chronic bronchitis: Secondary | ICD-10-CM

## 2021-09-10 DIAGNOSIS — F17200 Nicotine dependence, unspecified, uncomplicated: Secondary | ICD-10-CM

## 2021-09-10 DIAGNOSIS — I10 Essential (primary) hypertension: Secondary | ICD-10-CM

## 2021-09-15 ENCOUNTER — Ambulatory Visit: Payer: Medicaid Other | Admitting: Physician Assistant

## 2022-01-05 ENCOUNTER — Ambulatory Visit: Payer: Self-pay | Admitting: *Deleted

## 2022-01-05 NOTE — Telephone Encounter (Signed)
  Chief Complaint: recent fall, dizziness- concerned about BP/glucose cause- unable to check Symptoms: dizziness, blurred vision, fall Frequency: started over the week end Pertinent Negatives: Patient denies fever, chest pain, vomiting, diarrhea, bleeding Disposition: [] ED /[] Urgent Care (no appt availability in office) / [] Appointment(In office/virtual)/ []  [place] [facility]/ [] Home Care/ [] Refused Recommended Disposition /[x] [place] [facility]/ []  Follow-up with PCP Additional Notes: No open appointment- advised mobile unit today/UC

## 2022-01-05 NOTE — Telephone Encounter (Signed)
Reason for Disposition  [1] MODERATE dizziness (e.g., interferes with normal activities) AND [2] has NOT been evaluated by doctor (or NP/PA) for this  (Exception: Dizziness caused by heat exposure, sudden standing, or poor fluid intake.)  Answer Assessment - Initial Assessment Questions 1. DESCRIPTION: "Describe your dizziness."    Off balance 2. LIGHTHEADED: "Do you feel lightheaded?" (e.g., somewhat faint, woozy, weak upon standing)     yes 3. VERTIGO: "Do you feel like either you or the room is spinning or tilting?" (i.e. vertigo)     No 4. SEVERITY: "How bad is it?"  "Do you feel like you are going to faint?" "Can you stand and walk?"   - MILD: Feels slightly dizzy, but walking normally.   - MODERATE: Feels unsteady when walking, but not falling; interferes with normal activities (e.g., school, work).   - SEVERE: Unable to walk without falling, or requires assistance to walk without falling; feels like passing out now.      mild 5. ONSET:  "When did the dizziness begin?"     Just started- over the weekend 6. AGGRAVATING FACTORS: "Does anything make it worse?" (e.g., standing, change in head position)     no 7. HEART RATE: "Can you tell me your heart rate?" "How many beats in 15 seconds?"  (Note: not all patients can do this)       No way to check BP/glucose 8. CAUSE: "What do you think is causing the dizziness?"     Possible BP/glucose level 9. RECURRENT SYMPTOM: "Have you had dizziness before?" If Yes, ask: "When was the last time?" "What happened that time?"     no 10. OTHER SYMPTOMS: "Do you have any other symptoms?" (e.g., fever, chest pain, vomiting, diarrhea, bleeding)       Blurred vision, SOB 11. PREGNANCY: "Is there any chance you are pregnant?" "When was your last menstrual period?"  Protocols used: Dizziness - Lightheadedness-A-AH

## 2022-01-24 ENCOUNTER — Other Ambulatory Visit: Payer: Self-pay | Admitting: Family Medicine

## 2022-01-24 DIAGNOSIS — J42 Unspecified chronic bronchitis: Secondary | ICD-10-CM

## 2022-01-24 DIAGNOSIS — I10 Essential (primary) hypertension: Secondary | ICD-10-CM

## 2022-01-24 DIAGNOSIS — F17200 Nicotine dependence, unspecified, uncomplicated: Secondary | ICD-10-CM

## 2022-01-25 NOTE — Telephone Encounter (Signed)
Patient has future OV scheduled will refill medication.  Requested Prescriptions  Pending Prescriptions Disp Refills  . SYMBICORT 160-4.5 MCG/ACT inhaler [Pharmacy Med Name: Symbicort 160 mcg-4.5 mcg/actuation HFA aerosol inhaler] 10.2 g 0    Sig: INHALE TWO PUFFS BY MOUTH INTO LUNGS TWICE DAILY     Pulmonology:  Combination Products Passed - 01/24/2022 11:24 AM      Passed - Valid encounter within last 12 months    Recent Outpatient Visits          7 months ago Essential hypertension   [place] [facility] [place], Shea Stakes, NP   1 year ago Diabetes mellitus type 2, insulin dependent [facility])   [place] [facility] [place], Shea Stakes, NP   2 years ago Encounter for Papanicolaou smear for cervical cancer screening   [facility] [place], [place] W, NP   2 years ago Type 2 diabetes mellitus with hyperglycemia, with long-term current use of insulin [facility])   [place] [facility] [place], [place] W, NP   2 years ago Type 2 diabetes mellitus with hyperglycemia, with long-term current use of insulin [facility])   [place] [facility] [place], Shea Stakes, NP      Future Appointments            In 1 month Claiborne Rigg, NP [facility]           . VENTOLIN HFA 108 (90 Base) MCG/ACT inhaler [Pharmacy Med Name: Ventolin HFA 90 mcg/actuation aerosol inhaler] 18 g 0    Sig: INHALE 1 PUFF BY MOUTH INTO LUNGS EVERY 6 HOURS AS NEEDED FOR SHORTNESS OF BREATH OR wheezing     Pulmonology:  Beta Agonists 2 Passed - 01/24/2022 11:24 AM      Passed - Last BP in normal range    BP Readings from Last 1 Encounters:  02/11/21 96/67         Passed - Last Heart Rate in normal range    Pulse Readings from Last 1 Encounters:  02/11/21 82         Passed - Valid encounter within last 12 months    Recent Outpatient Visits          7 months ago Essential hypertension   [facility] [place], Shea Stakes, NP   1 year ago Diabetes mellitus type 2, insulin dependent [facility])   [place] [facility] [place], Shea Stakes, NP   2 years ago Encounter for Papanicolaou smear for cervical cancer screening   [facility] [place], [place] W, NP   2 years ago Type 2 diabetes mellitus with hyperglycemia, with long-term current use of insulin [facility])   [place] [facility] [place], [place] W, NP   2 years ago Type 2 diabetes mellitus with hyperglycemia, with long-term current use of insulin [facility])    [facility] [place], Shea Stakes, NP      Future Appointments            In 1 month Claiborne Rigg, NP [facility]           . lisinopril (ZESTRIL) 40 MG tablet [Pharmacy Med Name: lisinopril 40 mg tablet] 30 tablet 0    Sig: TAKE ONE TABLET BY MOUTH EVERY MORNING     Cardiovascular:  ACE Inhibitors Failed -  01/24/2022 11:24 AM      Failed - Cr in normal range and within 180 days    Creatinine, Ser  Date Value Ref Range Status  07/21/2021 0.65 0.57 - 1.00 mg/dL Final   Creatinine, Urine  Date Value Ref Range Status  11/17/2015 114 20 - 320 mg/dL Final         Failed - K in normal range and within 180 days    Potassium  Date Value Ref Range Status  07/21/2021 4.7 3.5 - 5.2 mmol/L Final         Failed - Valid encounter within last 6 months    Recent Outpatient Visits          7 months ago Essential hypertension   [place] [facility] [place], Shea Stakes, NP   1 year ago Diabetes mellitus type 2, insulin dependent [facility])   [place] [facility] [place], Shea Stakes, NP   2 years ago Encounter for Papanicolaou smear for cervical cancer screening   [facility] [place], [place] W, NP   2 years ago Type 2 diabetes mellitus with hyperglycemia, with long-term current use  of insulin [facility])   [place] [facility] [place], [place] W, NP   2 years ago Type 2 diabetes mellitus with hyperglycemia, with long-term current use of insulin [facility])   [place] [facility] Claiborne Rigg, NP      Future Appointments            In 1 month Claiborne Rigg, NP [facility] [facility]           Passed - Patient is not pregnant      Passed - Last BP in normal range    BP Readings from Last 1 Encounters:  02/11/21 96/67

## 2022-02-13 ENCOUNTER — Other Ambulatory Visit (HOSPITAL_COMMUNITY): Payer: Self-pay | Admitting: Psychiatry

## 2022-02-13 DIAGNOSIS — F332 Major depressive disorder, recurrent severe without psychotic features: Secondary | ICD-10-CM

## 2022-02-13 DIAGNOSIS — G2401 Drug induced subacute dyskinesia: Secondary | ICD-10-CM

## 2022-02-13 DIAGNOSIS — F1994 Other psychoactive substance use, unspecified with psychoactive substance-induced mood disorder: Secondary | ICD-10-CM

## 2022-02-21 ENCOUNTER — Other Ambulatory Visit: Payer: Self-pay | Admitting: Nurse Practitioner

## 2022-02-21 DIAGNOSIS — E1165 Type 2 diabetes mellitus with hyperglycemia: Secondary | ICD-10-CM

## 2022-02-22 NOTE — Telephone Encounter (Signed)
Appointment scheduled 02/28/22- curtesy RF 30 day given Requested Prescriptions  Pending Prescriptions Disp Refills  . metFORMIN (GLUCOPHAGE-XR) 500 MG 24 hr tablet [Pharmacy Med Name: metformin ER 500 mg tablet,extended release 24 hr] 90 tablet 0    Sig: TAKE ONE TABLET BY MOUTH ONCE DAILY FOR DIABETES     Endocrinology:  Diabetes - Biguanides Failed - 02/21/2022  5:24 PM      Failed - HBA1C is between 0 and 7.9 and within 180 days    Hgb A1c MFr Bld  Date Value Ref Range Status  07/21/2021 9.1 (H) 4.8 - 5.6 % Final    Comment:             Prediabetes: 5.7 - 6.4          Diabetes: >6.4          Glycemic control for adults with diabetes: <7.0          Failed - B12 Level in normal range and within 720 days    Vitamin B-12  Date Value Ref Range Status  03/24/2014 435 211 - 911 pg/mL Final    Comment:    Performed at [facility]         Failed - Valid encounter within last 6 months    Recent Outpatient Visits          8 months ago Essential hypertension   [place], Vernia Buff, NP   1 year ago Diabetes mellitus type 2, insulin dependent ([place])   [place], Vernia Buff, NP   2 years ago Encounter for Papanicolaou smear for cervical cancer screening   [employer], [place] W, NP   2 years ago Type 2 diabetes mellitus with hyperglycemia, with long-term current use of insulin ([place])   [place], [place] W, NP   2 years ago Type 2 diabetes mellitus with hyperglycemia, with long-term current use of insulin ([place])   [place], Vernia Buff, NP      Future Appointments            In 6 days Gildardo Pounds, NP [place]           Failed - CBC within normal limits and completed in the last 12 months    WBC  Date Value Ref Range Status  07/21/2021 7.7 3.4 - 10.8  x10E3/uL Final  02/11/2021 8.8 4.0 - 10.5 K/uL Final   RBC  Date Value Ref Range Status  07/21/2021 4.07 3.77 - 5.28 x10E6/uL Final  02/11/2021 4.53 3.87 - 5.11 MIL/uL Final   Hemoglobin  Date Value Ref Range Status  07/21/2021 13.1 11.1 - 15.9 g/dL Final   Hematocrit  Date Value Ref Range Status  07/21/2021 39.1 34.0 - 46.6 % Final   MCHC  Date Value Ref Range Status  07/21/2021 33.5 31.5 - 35.7 g/dL Final  02/11/2021 32.7 30.0 - 36.0 g/dL Final   [facility]  Date Value Ref Range Status  07/21/2021 32.2 26.6 - 33.0 pg Final  02/11/2021 30.7 26.0 - 34.0 pg Final   MCV  Date Value Ref Range Status  07/21/2021 96 79 - 97 fL Final   No results found for: "PLTCOUNTKUC", "LABPLAT", "POCPLA" RDW  Date Value Ref Range Status  07/21/2021 12.3 11.7 - 15.4 % Final         Passed -  Cr in normal range and within 360 days    Creatinine, Ser  Date Value Ref Range Status  07/21/2021 0.65 0.57 - 1.00 mg/dL Final   Creatinine, Urine  Date Value Ref Range Status  11/17/2015 114 20 - 320 mg/dL Final         Passed - eGFR in normal range and within 360 days    GFR calc Af Amer  Date Value Ref Range Status  12/12/2019 >60 >60 mL/min Final   GFR, Estimated  Date Value Ref Range Status  02/11/2021 >60 >60 mL/min Final    Comment:    (NOTE) Calculated using the CKD-EPI Creatinine Equation (2021)    eGFR  Date Value Ref Range Status  07/21/2021 101 >59 mL/min/1.73 Final

## 2022-02-28 ENCOUNTER — Ambulatory Visit: Payer: Medicaid Other | Attending: Nurse Practitioner | Admitting: Nurse Practitioner

## 2022-02-28 ENCOUNTER — Encounter: Payer: Self-pay | Admitting: Nurse Practitioner

## 2022-02-28 VITALS — BP 119/78 | HR 79 | Temp 98.0°F | Ht 62.0 in | Wt 129.4 lb

## 2022-02-28 DIAGNOSIS — I10 Essential (primary) hypertension: Secondary | ICD-10-CM | POA: Diagnosis not present

## 2022-02-28 DIAGNOSIS — Z794 Long term (current) use of insulin: Secondary | ICD-10-CM | POA: Insufficient documentation

## 2022-02-28 DIAGNOSIS — G2401 Drug induced subacute dyskinesia: Secondary | ICD-10-CM | POA: Diagnosis not present

## 2022-02-28 DIAGNOSIS — Z7984 Long term (current) use of oral hypoglycemic drugs: Secondary | ICD-10-CM | POA: Insufficient documentation

## 2022-02-28 DIAGNOSIS — E1165 Type 2 diabetes mellitus with hyperglycemia: Secondary | ICD-10-CM | POA: Diagnosis not present

## 2022-02-28 DIAGNOSIS — E782 Mixed hyperlipidemia: Secondary | ICD-10-CM | POA: Diagnosis not present

## 2022-02-28 DIAGNOSIS — M2550 Pain in unspecified joint: Secondary | ICD-10-CM

## 2022-02-28 DIAGNOSIS — M533 Sacrococcygeal disorders, not elsewhere classified: Secondary | ICD-10-CM | POA: Diagnosis not present

## 2022-02-28 DIAGNOSIS — M25551 Pain in right hip: Secondary | ICD-10-CM | POA: Diagnosis not present

## 2022-02-28 DIAGNOSIS — Z1211 Encounter for screening for malignant neoplasm of colon: Secondary | ICD-10-CM

## 2022-02-28 DIAGNOSIS — M25552 Pain in left hip: Secondary | ICD-10-CM | POA: Diagnosis not present

## 2022-02-28 LAB — POCT GLYCOSYLATED HEMOGLOBIN (HGB A1C): Hemoglobin A1C: 9.4 % — AB (ref 4.0–5.6)

## 2022-02-28 MED ORDER — LEVEMIR FLEXTOUCH 100 UNIT/ML ~~LOC~~ SOPN: 30.0000 [IU] | PEN_INJECTOR | Freq: Two times a day (BID) | SUBCUTANEOUS | 6 refills | Status: DC

## 2022-02-28 MED ORDER — ACCU-CHEK GUIDE VI STRP: ORAL_STRIP | 12 refills | Status: DC

## 2022-02-28 MED ORDER — EMPAGLIFLOZIN 10 MG PO TABS: 10.0000 mg | ORAL_TABLET | Freq: Every day | ORAL | 1 refills | Status: DC

## 2022-02-28 MED ORDER — ACCU-CHEK SOFTCLIX LANCETS MISC: 6 refills | Status: DC

## 2022-02-28 MED ORDER — ACCU-CHEK GUIDE ME W/DEVICE KIT: PACK | 0 refills | Status: DC

## 2022-02-28 NOTE — Progress Notes (Signed)
Assessment & Plan:  Robin Montgomery was seen today for diabetes.  Diagnoses and all orders for this visit:  Type 2 diabetes mellitus with hyperglycemia, with long-term current use of insulin (HCC) -     POCT glycosylated hemoglobin (Hb A1C) -     CMP14+EGFR -     Ambulatory referral to Ophthalmology -     Microalbumin / creatinine urine ratio -     empagliflozin (JARDIANCE) 10 MG TABS tablet; Take 1 tablet (10 mg total) by mouth daily before breakfast. -     Blood Glucose Monitoring Suppl (ACCU-CHEK GUIDE ME) w/Device KIT; Use to check blood sugar twice daily. E11.65 -     glucose blood (ACCU-CHEK GUIDE) test strip; Use to check blood sugar twice daily. E11.65 -     Accu-Chek Softclix Lancets lancets; Use as instructed to check blood sugar twice daily. E11.65 -     insulin detemir (LEVEMIR FLEXTOUCH) 100 UNIT/ML FlexPen; Inject 30 Units into the skin 2 (two) times daily. For diabetes  Colon cancer screening -     Ambulatory referral to Gastroenterology  Mixed hyperlipidemia -     Lipid panel  Tardive dyskinesia -     Ambulatory referral to Neurology  Arthralgia of multiple joints -     DG Sacrum/Coccyx; Future -     DG Hip Unilat W OR W/O Pelvis 2-3 Views Left; Future -     DG Hip Unilat W OR W/O Pelvis 2-3 Views Right; Future    Patient has been counseled on age-appropriate routine health concerns for screening and prevention. These are reviewed and up-to-date. Referrals have been placed accordingly. Immunizations are up-to-date or declined.    Subjective:   Chief Complaint  Patient presents with   Diabetes   HPI Robin Montgomery 61 y.o. female presents to office today for follow up to DM. She also has complaints of sacral and bilateral hip pain.    DM 2 She stopped taking metformin " a long time ago". I was not aware of this. She states she couldn't tolerate it due to GI upset. She just started back taking her insulin Levemir 30 units BID yesterday. Had not been taking this  for several months. She is requesting approval for dental extraction surgery. I would not recommend this until A1c <7.5 Lab Results  Component Value Date   HGBA1C 9.4 (A) 02/28/2022    Lab Results  Component Value Date   HGBA1C 9.1 (H) 07/21/2021    Lab Results  Component Value Date   LDLCALC 135 (H) 07/21/2021    HTN Blood pressure is well controlled with lisinopril 40 mg daily.   BP Readings from Last 3 Encounters:  02/28/22 119/78  02/11/21 96/67  02/10/21 (!) 142/79    Arthralgias Notes several months onset of bilateral hip and sacral pain. Denies any falls or trauma. Aggravating factors: sitting or lying on her left or right side in the bed at night. She denies sciatica, weakness or bowel/bladder incontinence.    She has abnormal involuntary swaying/rocking from side to side. She initially thought it was related to her trazodone and Not atking trazodon or abilify so she stopped taking them. The involuntary movements have persisted. She is currently taking Austedo 36 mg daily. I do not see any recent office visits with the psych NP. Will cc her on this note today.   Review of Systems  Constitutional:  Negative for fever, malaise/fatigue and weight loss.  HENT: Negative.  Negative for nosebleeds.   Eyes:  Negative.  Negative for blurred vision, double vision and photophobia.  Respiratory: Negative.  Negative for cough and shortness of breath.   Cardiovascular: Negative.  Negative for chest pain, palpitations and leg swelling.  Gastrointestinal: Negative.  Negative for heartburn, nausea and vomiting.  Musculoskeletal:  Positive for back pain and joint pain. Negative for falls and myalgias.  Neurological:  Negative for dizziness, focal weakness, seizures and headaches.       SEE HPI  Psychiatric/Behavioral: Negative.  Negative for suicidal ideas.     Past Medical History:  Diagnosis Date   Depression    Diabetes mellitus without complication ([place])    Hypertension      Past Surgical History:  Procedure Laterality Date   CHOLECYSTECTOMY      No family history on file.  Social History Reviewed with no changes to be made today.   Outpatient Medications Prior to Visit  Medication Sig Dispense Refill   acetaminophen (TYLENOL) 500 MG tablet Take 1 tablet (500 mg total) by mouth every 6 (six) hours as needed. 30 tablet 0   ARIPiprazole (ABILIFY) 10 MG tablet TAKE ONE TABLET BY MOUTH EVERYDAY AT BEDTIME 30 tablet 2   atorvastatin (LIPITOR) 40 MG tablet Take 1 tablet (40 mg total) by mouth daily. FOR CHOLESTEROL 90 tablet 3   AUSTEDO 12 MG TABS TAKE THREE TABLETS BY MOUTH ONCE DAILY 90 tablet 2   lisinopril (ZESTRIL) 40 MG tablet TAKE ONE TABLET BY MOUTH EVERY MORNING 30 tablet 0   Misc. Devices (OFFSET CANE) MISC Please provide patient with single cane. M70.62 1 each 0   naproxen (NAPROSYN) 375 MG tablet Take 1 tablet (375 mg total) by mouth 2 (two) times daily. 20 tablet 0   SYMBICORT 160-4.5 MCG/ACT inhaler INHALE TWO PUFFS BY MOUTH INTO LUNGS TWICE DAILY 10.2 g 0   traZODone (DESYREL) 50 MG tablet TAKE 1/2 TABLET BY MOUTH EVERYDAY AT BEDTIME FOR SLEEP 30 tablet 2   VENTOLIN HFA 108 (90 Base) MCG/ACT inhaler INHALE 1 PUFF BY MOUTH INTO LUNGS EVERY 6 HOURS AS NEEDED FOR SHORTNESS OF BREATH OR wheezing 18 g 0   traMADol (ULTRAM) 50 MG tablet take 1 tablet (50 mg) by oral route every 4-6 hours as needed 12 tablet 0   Blood Pressure Monitor DEVI Please provide patient with insurance approved blood pressure monitor. (Patient not taking: Reported on 02/28/2022) 1 each 0   Insulin Pen Needle (TECHLITE PEN NEEDLES) 31G X 5 MM MISC use as instructed.inject into the skin once nightly 100 each 3   omeprazole (PRILOSEC) 40 MG capsule Take 1 capsule (40 mg total) by mouth every evening. FOR ACID REFLUX 90 capsule 1   Accu-Chek Softclix Lancets lancets Use as instructed to check blood sugar twice daily. E11.65 (Patient not taking: Reported on 02/28/2022) 100 each 6    Blood Glucose Monitoring Suppl (ACCU-CHEK GUIDE ME) w/Device KIT Use to check blood sugar twice daily. E11.65 (Patient not taking: Reported on 02/28/2022) 1 kit 0   glucose blood (ACCU-CHEK GUIDE) test strip Use to check blood sugar twice daily. E11.65 (Patient not taking: Reported on 02/28/2022) 100 each 12   insulin detemir (LEVEMIR FLEXTOUCH) 100 UNIT/ML FlexPen Inject 30 Units into the skin 2 (two) times daily. For diabetes 54 mL 1   metFORMIN (GLUCOPHAGE-XR) 500 MG 24 hr tablet TAKE ONE TABLET BY MOUTH ONCE DAILY FOR DIABETES (Patient not taking: Reported on 02/28/2022) 30 tablet 0   No facility-administered medications prior to visit.    Allergies  Allergen  Reactions   Aspirin Nausea And Vomiting    Upset stomach       Objective:    BP 119/78   Pulse 79   Temp 98 F (36.7 C) (Oral)   Ht _0  (1.575 m)   Wt 129 lb 6.4 oz (58.7 kg)   SpO2 96%   BMI 23.67 kg/m  Wt Readings from Last 3 Encounters:  02/28/22 129 lb 6.4 oz (58.7 kg)  02/11/21 147 lb 9.6 oz (67 kg)  01/08/20 147 lb 9.6 oz (67 kg)    Physical Exam Vitals and nursing note reviewed.  Constitutional:      Appearance: She is well-developed.  HENT:     Head: Normocephalic and atraumatic.  Cardiovascular:     Rate and Rhythm: Normal rate and regular rhythm.     Heart sounds: Normal heart sounds. No murmur heard.    No friction rub. No gallop.  Pulmonary:     Effort: Pulmonary effort is normal. No tachypnea or respiratory distress.     Breath sounds: Normal breath sounds. No decreased breath sounds, wheezing, rhonchi or rales.  Chest:     Chest wall: No tenderness.  Abdominal:     General: Bowel sounds are normal.     Palpations: Abdomen is soft.  Musculoskeletal:        General: Normal range of motion.     Cervical back: Normal range of motion.  Skin:    General: Skin is warm and dry.  Neurological:     Mental Status: She is alert and oriented to person, place, and time.     Coordination: Coordination  normal.     Comments: Involuntary movements/swaying from side to side  Psychiatric:        Behavior: Behavior is slowed. Behavior is cooperative.        Judgment: Judgment normal.          Patient has been counseled extensively about nutrition and exercise as well as the importance of adherence with medications and regular follow-up. The patient was given clear instructions to go to ER or return to medical center if symptoms don't improve, worsen or new problems develop. The patient verbalized understanding.   Follow-up: Return in about 4 weeks (around 03/28/2022) for 4 weeks meter check with LUKE. see me in 3 months.   Gildardo Pounds, FNP-BC [facility] and [place], [place]   02/28/2022, 2:40 PM

## 2022-03-01 LAB — CMP14+EGFR
ALT: 65 IU/L — ABNORMAL HIGH (ref 0–32)
AST: 36 IU/L (ref 0–40)
Albumin/Globulin Ratio: 1.8 (ref 1.2–2.2)
Albumin: 4.8 g/dL (ref 3.9–4.9)
Alkaline Phosphatase: 111 IU/L (ref 44–121)
BUN/Creatinine Ratio: 15 (ref 12–28)
BUN: 8 mg/dL (ref 8–27)
Bilirubin Total: 0.2 mg/dL (ref 0.0–1.2)
CO2: 22 mmol/L (ref 20–29)
Calcium: 10.1 mg/dL (ref 8.7–10.3)
Chloride: 101 mmol/L (ref 96–106)
Creatinine, Ser: 0.53 mg/dL — ABNORMAL LOW (ref 0.57–1.00)
Globulin, Total: 2.6 g/dL (ref 1.5–4.5)
Glucose: 216 mg/dL — ABNORMAL HIGH (ref 70–99)
Potassium: 4.4 mmol/L (ref 3.5–5.2)
Sodium: 141 mmol/L (ref 134–144)
Total Protein: 7.4 g/dL (ref 6.0–8.5)
eGFR: 105 mL/min/{1.73_m2} (ref >59)

## 2022-03-01 LAB — LIPID PANEL
Chol/HDL Ratio: 2.4 ratio (ref 0.0–4.4)
Cholesterol, Total: 198 mg/dL (ref 100–199)
HDL: 82 mg/dL (ref >39)
LDL Chol Calc (NIH): 102 mg/dL — ABNORMAL HIGH (ref 0–99)
Triglycerides: 76 mg/dL (ref 0–149)
VLDL Cholesterol Cal: 14 mg/dL (ref 5–40)

## 2022-05-02 ENCOUNTER — Encounter: Payer: Self-pay | Admitting: Neurology

## 2022-05-02 ENCOUNTER — Ambulatory Visit: Payer: Medicaid Other | Admitting: Neurology

## 2022-05-17 ENCOUNTER — Other Ambulatory Visit: Payer: Self-pay | Admitting: Nurse Practitioner

## 2022-05-17 DIAGNOSIS — F17200 Nicotine dependence, unspecified, uncomplicated: Secondary | ICD-10-CM

## 2022-05-17 DIAGNOSIS — E1165 Type 2 diabetes mellitus with hyperglycemia: Secondary | ICD-10-CM

## 2022-05-17 DIAGNOSIS — J42 Unspecified chronic bronchitis: Secondary | ICD-10-CM

## 2022-05-17 DIAGNOSIS — I10 Essential (primary) hypertension: Secondary | ICD-10-CM

## 2022-06-17 ENCOUNTER — Other Ambulatory Visit: Payer: Self-pay | Admitting: Nurse Practitioner

## 2022-06-17 DIAGNOSIS — F17200 Nicotine dependence, unspecified, uncomplicated: Secondary | ICD-10-CM

## 2022-06-17 DIAGNOSIS — J42 Unspecified chronic bronchitis: Secondary | ICD-10-CM

## 2022-07-14 ENCOUNTER — Other Ambulatory Visit: Payer: Self-pay | Admitting: Family Medicine

## 2022-07-14 ENCOUNTER — Other Ambulatory Visit: Payer: Self-pay | Admitting: Nurse Practitioner

## 2022-07-14 ENCOUNTER — Other Ambulatory Visit (HOSPITAL_COMMUNITY): Payer: Self-pay | Admitting: Psychiatry

## 2022-07-14 DIAGNOSIS — E782 Mixed hyperlipidemia: Secondary | ICD-10-CM

## 2022-07-14 DIAGNOSIS — K219 Gastro-esophageal reflux disease without esophagitis: Secondary | ICD-10-CM

## 2022-07-14 DIAGNOSIS — G2401 Drug induced subacute dyskinesia: Secondary | ICD-10-CM

## 2022-07-14 DIAGNOSIS — Z794 Long term (current) use of insulin: Secondary | ICD-10-CM

## 2022-07-14 DIAGNOSIS — J42 Unspecified chronic bronchitis: Secondary | ICD-10-CM

## 2022-07-14 DIAGNOSIS — F17200 Nicotine dependence, unspecified, uncomplicated: Secondary | ICD-10-CM

## 2022-07-14 NOTE — Telephone Encounter (Signed)
Requested Prescriptions  Pending Prescriptions Disp Refills   atorvastatin (LIPITOR) 40 MG tablet [Pharmacy Med Name: atorvastatin 40 mg tablet] 90 tablet 0    Sig: TAKE ONE TABLET BY MOUTH ONCE DAILY FOR CHOLESTEROL     Cardiovascular:  Antilipid - Statins Failed - 07/14/2022 10:32 AM      Failed - Lipid Panel in normal range within the last 12 months    Cholesterol, Total  Date Value Ref Range Status  02/28/2022 198 100 - 199 mg/dL Final   LDL Chol Calc (NIH)  Date Value Ref Range Status  02/28/2022 102 (H) 0 - 99 mg/dL Final   HDL  Date Value Ref Range Status  02/28/2022 82 >39 mg/dL Final   Triglycerides  Date Value Ref Range Status  02/28/2022 76 0 - 149 mg/dL Final         Passed - Patient is not pregnant      Passed - Valid encounter within last 12 months    Recent Outpatient Visits           4 months ago Type 2 diabetes mellitus with hyperglycemia, with long-term current use of insulin ([place])   [place], Vernia Buff, NP   1 year ago Essential hypertension   [place], [place] W, NP   1 year ago Diabetes mellitus type 2, insulin dependent [facility])   [place], Vernia Buff, NP   2 years ago Encounter for Papanicolaou smear for cervical cancer screening   [place], [place] W, NP   2 years ago Type 2 diabetes mellitus with hyperglycemia, with long-term current use of insulin [facility])   [place], [place] W, NP               omeprazole (PRILOSEC) 40 MG capsule [Pharmacy Med Name: omeprazole 40 mg capsule,delayed release] 90 capsule 0    Sig: TAKE ONE CAPSULE BY MOUTH EVERY EVENING FOR acid reflux     Gastroenterology: Proton Pump Inhibitors Passed - 07/14/2022 10:32 AM      Passed - Valid encounter within last 12 months    Recent Outpatient Visits            4 months ago Type 2 diabetes mellitus with hyperglycemia, with long-term current use of insulin [facility])   [place], Vernia Buff, NP   1 year ago Essential hypertension   [place], [place] W, NP   1 year ago Diabetes mellitus type 2, insulin dependent [facility])   [place], [place], NP   2 years ago Encounter for Papanicolaou smear for cervical cancer screening   [place], [place] W, NP   2 years ago Type 2 diabetes mellitus with hyperglycemia, with long-term current use of insulin [facility])   [place], [place] W, NP               metFORMIN (GLUCOPHAGE-XR) 500 MG 24 hr tablet [Pharmacy Med Name: metformin ER 500 mg tablet,extended release 24 hr] 90 tablet 0    Sig: TAKE ONE TABLET BY MOUTH ONCE DAILY     Endocrinology:  Diabetes - Biguanides Failed - 07/14/2022 10:32 AM      Failed - Cr  in normal range and within 360 days    Creatinine, Ser  Date Value Ref Range Status  02/28/2022 0.53 (L) 0.57 - 1.00 mg/dL Final   Creatinine, Urine  Date Value Ref Range Status  11/17/2015 114 20 - 320 mg/dL Final         Failed - HBA1C is between 0 and 7.9 and within 180 days    Hemoglobin A1C  Date Value Ref Range Status  02/28/2022 9.4 (A) 4.0 - 5.6 % Final   Hgb A1c MFr Bld  Date Value Ref Range Status  07/21/2021 9.1 (H) 4.8 - 5.6 % Final    Comment:             Prediabetes: 5.7 - 6.4          Diabetes: >6.4          Glycemic control for adults with diabetes: <7.0          Failed - B12 Level in normal range and within 720 days    Vitamin B-12  Date Value Ref Range Status  03/24/2014 435 211 - 911 pg/mL Final    Comment:    Performed at [facility]         Failed - CBC within normal limits and completed in the last 12 months    WBC  Date Value Ref Range Status   07/21/2021 7.7 3.4 - 10.8 x10E3/uL Final  02/11/2021 8.8 4.0 - 10.5 K/uL Final   RBC  Date Value Ref Range Status  07/21/2021 4.07 3.77 - 5.28 x10E6/uL Final  02/11/2021 4.53 3.87 - 5.11 MIL/uL Final   Hemoglobin  Date Value Ref Range Status  07/21/2021 13.1 11.1 - 15.9 g/dL Final   Hematocrit  Date Value Ref Range Status  07/21/2021 39.1 34.0 - 46.6 % Final   MCHC  Date Value Ref Range Status  07/21/2021 33.5 31.5 - 35.7 g/dL Final  02/11/2021 32.7 30.0 - 36.0 g/dL Final   [facility]  Date Value Ref Range Status  07/21/2021 32.2 26.6 - 33.0 pg Final  02/11/2021 30.7 26.0 - 34.0 pg Final   MCV  Date Value Ref Range Status  07/21/2021 96 79 - 97 fL Final   No results found for: "PLTCOUNTKUC", "LABPLAT", "POCPLA" RDW  Date Value Ref Range Status  07/21/2021 12.3 11.7 - 15.4 % Final         Passed - eGFR in normal range and within 360 days    GFR calc Af Amer  Date Value Ref Range Status  12/12/2019 >60 >60 mL/min Final   GFR, Estimated  Date Value Ref Range Status  02/11/2021 >60 >60 mL/min Final    Comment:    (NOTE) Calculated using the CKD-EPI Creatinine Equation (2021)    eGFR  Date Value Ref Range Status  02/28/2022 105 >59 mL/min/1.73 Final         Passed - Valid encounter within last 6 months    Recent Outpatient Visits           4 months ago Type 2 diabetes mellitus with hyperglycemia, with long-term current use of insulin ([place])   [place], Vernia Buff, NP   1 year ago Essential hypertension   [place], Vernia Buff, NP   1 year ago Diabetes mellitus type 2, insulin dependent [facility])   [place] Gildardo Pounds, NP   2 years ago Encounter for Papanicolaou  smear for cervical cancer screening   [place], [place] W, NP   2 years ago Type 2 diabetes mellitus with hyperglycemia, with long-term  current use of insulin [facility])   [place], Vernia Buff, NP

## 2022-07-14 NOTE — Telephone Encounter (Signed)
Requested medication (s) are due for refill today: yes  Requested medication (s) are on the active medication list: yes  Last refill:  06/17/22  Future visit scheduled: no  Notes to clinic:  Unable to refill per protocol, courtesy refill already given, routing for provider approval.      Requested Prescriptions  Pending Prescriptions Disp Refills   SYMBICORT 160-4.5 MCG/ACT inhaler [Pharmacy Med Name: Symbicort 160 mcg-4.5 mcg/actuation HFA aerosol inhaler] 10.2 g 0    Sig: INHALE TWO PUFFS BY MOUTH INTO LUNGS IN THE MORNING AND AT bedtime. Needs appointment for further refills     Pulmonology:  Combination Products Passed - 07/14/2022 10:32 AM      Passed - Valid encounter within last 12 months    Recent Outpatient Visits           4 months ago Type 2 diabetes mellitus with hyperglycemia, with long-term current use of insulin [facility])   [place], Vernia Buff, NP   1 year ago Essential hypertension   [place], Vernia Buff, NP   1 year ago Diabetes mellitus type 2, insulin dependent [facility])   [place], Vernia Buff, NP   2 years ago Encounter for Papanicolaou smear for cervical cancer screening   [place], [place] W, NP   2 years ago Type 2 diabetes mellitus with hyperglycemia, with long-term current use of insulin [facility])   [place], Vernia Buff, NP

## 2022-07-20 ENCOUNTER — Other Ambulatory Visit: Payer: Self-pay | Admitting: Nurse Practitioner

## 2022-07-20 DIAGNOSIS — F332 Major depressive disorder, recurrent severe without psychotic features: Secondary | ICD-10-CM

## 2022-07-20 MED ORDER — TRAZODONE HCL 50 MG PO TABS: ORAL_TABLET | ORAL | 0 refills | Status: DC

## 2022-07-20 NOTE — Telephone Encounter (Signed)
Requested medication (s) are due for refill today: yes  Requested medication (s) are on the active medication list: yes  Last refill:  02/14/22  Future visit scheduled: ys  Notes to clinic:  Unable to refill per protocol, last refill by another provider.      Requested Prescriptions  Pending Prescriptions Disp Refills   traZODone (DESYREL) 50 MG tablet 30 tablet 2     Psychiatry: Antidepressants - Serotonin Modulator Passed - 07/20/2022  9:42 AM      Passed - Completed PHQ-2 or PHQ-9 in the last 360 days      Passed - Valid encounter within last 6 months    Recent Outpatient Visits           4 months ago Type 2 diabetes mellitus with hyperglycemia, with long-term current use of insulin [facility])   [place], Vernia Buff, NP   1 year ago Essential hypertension   [place], [place] W, NP   1 year ago Diabetes mellitus type 2, insulin dependent [facility])   [place], Vernia Buff, NP   2 years ago Encounter for Papanicolaou smear for cervical cancer screening   [place], [place] W, NP   2 years ago Type 2 diabetes mellitus with hyperglycemia, with long-term current use of insulin [facility])   [place], Vernia Buff, NP

## 2022-07-20 NOTE — Telephone Encounter (Signed)
Medication Refill - Medication: hydrOXYzine tablet 25 mg ,traZODone (DESYREL) 50 MG tablet   Courtney from [employer] requesting refills. Stated she called office of prescribers, but they advised her they don't see pt anymore.  Please advise.   Has the patient contacted their pharmacy? Yes.    (Agent: If yes, when and what did the pharmacy advise?)  Preferred Pharmacy (with phone number or street name):  [facility] - [place], [place] - [address]  [address] [place] [address]  Phone: [phone] Fax: [phone]  Hours: Not open 24 hours   Has the patient been seen for an appointment in the last year OR does the patient have an upcoming appointment? Yes.    Agent: Please be advised that RX refills may take up to 3 business days. We ask that you follow-up with your pharmacy.

## 2022-08-16 ENCOUNTER — Other Ambulatory Visit: Payer: Self-pay | Admitting: Nurse Practitioner

## 2022-08-16 DIAGNOSIS — F1994 Other psychoactive substance use, unspecified with psychoactive substance-induced mood disorder: Secondary | ICD-10-CM

## 2022-08-17 ENCOUNTER — Telehealth: Payer: Self-pay | Admitting: Nurse Practitioner

## 2022-08-17 NOTE — Telephone Encounter (Signed)
Attempted to contact patient to inform her to schedule an appointment but message states that call can not be completed at this time.

## 2022-08-17 NOTE — Telephone Encounter (Signed)
[facility] is calling on behalf of pt because they faxed over some refill requests and they received some back, but they're missing the one for pt's albuterol inhaler as well as the one for the symbicort inhaler. Please advise.

## 2022-08-28 ENCOUNTER — Other Ambulatory Visit: Payer: Self-pay | Admitting: Family Medicine

## 2022-08-28 DIAGNOSIS — F17200 Nicotine dependence, unspecified, uncomplicated: Secondary | ICD-10-CM

## 2022-08-28 DIAGNOSIS — J42 Unspecified chronic bronchitis: Secondary | ICD-10-CM

## 2022-08-29 NOTE — Telephone Encounter (Signed)
Requested Prescriptions  Pending Prescriptions Disp Refills   VENTOLIN HFA 108 (90 Base) MCG/ACT inhaler [Pharmacy Med Name: Ventolin HFA 90 mcg/actuation aerosol inhaler] 18 g 0    Sig: INHALE 1 PUFF BY MOUTH INTO LUNGS EVERY 6 HOURS AS NEEDED FOR WHEEZING AND/OR SHORTNESS OF BREATH Needs appointment for further refills     Pulmonology:  Beta Agonists 2 Passed - 08/28/2022  8:04 AM      Passed - Last BP in normal range    BP Readings from Last 1 Encounters:  02/28/22 119/78         Passed - Last Heart Rate in normal range    Pulse Readings from Last 1 Encounters:  02/28/22 79         Passed - Valid encounter within last 12 months    Recent Outpatient Visits           6 months ago Type 2 diabetes mellitus with hyperglycemia, with long-term current use of insulin ([place])   [place], Vernia Buff, NP   1 year ago Essential hypertension   [place], [place] W, NP   1 year ago Diabetes mellitus type 2, insulin dependent [facility])   [place], Vernia Buff, NP   2 years ago Encounter for Papanicolaou smear for cervical cancer screening   [place], [place] W, NP   2 years ago Type 2 diabetes mellitus with hyperglycemia, with long-term current use of insulin [facility])   [place], Vernia Buff, NP

## 2022-09-13 ENCOUNTER — Other Ambulatory Visit: Payer: Self-pay | Admitting: Family Medicine

## 2022-09-13 ENCOUNTER — Other Ambulatory Visit: Payer: Self-pay | Admitting: Nurse Practitioner

## 2022-09-13 DIAGNOSIS — I10 Essential (primary) hypertension: Secondary | ICD-10-CM

## 2022-09-13 DIAGNOSIS — F17200 Nicotine dependence, unspecified, uncomplicated: Secondary | ICD-10-CM

## 2022-09-13 DIAGNOSIS — J42 Unspecified chronic bronchitis: Secondary | ICD-10-CM

## 2022-10-18 ENCOUNTER — Other Ambulatory Visit: Payer: Self-pay | Admitting: Nurse Practitioner

## 2022-10-18 ENCOUNTER — Other Ambulatory Visit: Payer: Self-pay | Admitting: Family Medicine

## 2022-10-18 DIAGNOSIS — F17200 Nicotine dependence, unspecified, uncomplicated: Secondary | ICD-10-CM

## 2022-10-18 DIAGNOSIS — J42 Unspecified chronic bronchitis: Secondary | ICD-10-CM

## 2022-10-18 DIAGNOSIS — I10 Essential (primary) hypertension: Secondary | ICD-10-CM

## 2022-11-15 ENCOUNTER — Inpatient Hospital Stay (HOSPITAL_COMMUNITY)
Admission: EM | Admit: 2022-11-15 | Discharge: 2022-11-19 | DRG: 690 | Disposition: A | Discharge status: Home / Self Care | Payer: Medicaid Other | Attending: Family Medicine | Admitting: Family Medicine

## 2022-11-15 DIAGNOSIS — M79672 Pain in left foot: Secondary | ICD-10-CM | POA: Diagnosis present

## 2022-11-15 DIAGNOSIS — F17210 Nicotine dependence, cigarettes, uncomplicated: Secondary | ICD-10-CM | POA: Diagnosis present

## 2022-11-15 DIAGNOSIS — F1410 Cocaine abuse, uncomplicated: Secondary | ICD-10-CM | POA: Diagnosis present

## 2022-11-15 DIAGNOSIS — B9620 Unspecified Escherichia coli [E. coli] as the cause of diseases classified elsewhere: Secondary | ICD-10-CM | POA: Diagnosis present

## 2022-11-15 DIAGNOSIS — F1910 Other psychoactive substance abuse, uncomplicated: Secondary | ICD-10-CM

## 2022-11-15 DIAGNOSIS — F332 Major depressive disorder, recurrent severe without psychotic features: Secondary | ICD-10-CM | POA: Diagnosis present

## 2022-11-15 DIAGNOSIS — F17200 Nicotine dependence, unspecified, uncomplicated: Secondary | ICD-10-CM | POA: Diagnosis present

## 2022-11-15 DIAGNOSIS — K529 Noninfective gastroenteritis and colitis, unspecified: Secondary | ICD-10-CM | POA: Diagnosis present

## 2022-11-15 DIAGNOSIS — K219 Gastro-esophageal reflux disease without esophagitis: Secondary | ICD-10-CM

## 2022-11-15 DIAGNOSIS — E782 Mixed hyperlipidemia: Secondary | ICD-10-CM

## 2022-11-15 DIAGNOSIS — I10 Essential (primary) hypertension: Secondary | ICD-10-CM | POA: Diagnosis present

## 2022-11-15 DIAGNOSIS — E119 Type 2 diabetes mellitus without complications: Secondary | ICD-10-CM

## 2022-11-15 DIAGNOSIS — Z91128 Patient's intentional underdosing of medication regimen for other reason: Secondary | ICD-10-CM

## 2022-11-15 DIAGNOSIS — Z79899 Other long term (current) drug therapy: Secondary | ICD-10-CM

## 2022-11-15 DIAGNOSIS — N3000 Acute cystitis without hematuria: Principal | ICD-10-CM | POA: Diagnosis present

## 2022-11-15 DIAGNOSIS — Z794 Long term (current) use of insulin: Secondary | ICD-10-CM

## 2022-11-15 DIAGNOSIS — F1994 Other psychoactive substance use, unspecified with psychoactive substance-induced mood disorder: Secondary | ICD-10-CM

## 2022-11-15 DIAGNOSIS — S93312A Subluxation of tarsal joint of left foot, initial encounter: Principal | ICD-10-CM

## 2022-11-15 DIAGNOSIS — T383X6A Underdosing of insulin and oral hypoglycemic [antidiabetic] drugs, initial encounter: Secondary | ICD-10-CM | POA: Diagnosis present

## 2022-11-15 DIAGNOSIS — E876 Hypokalemia: Secondary | ICD-10-CM | POA: Diagnosis present

## 2022-11-15 DIAGNOSIS — F1010 Alcohol abuse, uncomplicated: Secondary | ICD-10-CM | POA: Diagnosis present

## 2022-11-15 DIAGNOSIS — F10930 Alcohol use, unspecified with withdrawal, uncomplicated: Secondary | ICD-10-CM

## 2022-11-15 DIAGNOSIS — E440 Moderate protein-calorie malnutrition: Secondary | ICD-10-CM | POA: Diagnosis present

## 2022-11-15 DIAGNOSIS — J42 Unspecified chronic bronchitis: Secondary | ICD-10-CM

## 2022-11-15 MED ORDER — THIAMINE HCL 100 MG/ML IJ SOLN
100.0000 mg | Freq: Every day | INTRAMUSCULAR | Status: DC
  Administered 2022-11-16: 100 mg via INTRAVENOUS
  Filled 2022-11-15: qty 2

## 2022-11-15 MED ORDER — THIAMINE MONONITRATE 100 MG PO TABS
100.0000 mg | ORAL_TABLET | Freq: Every day | ORAL | Status: DC
  Administered 2022-11-16 – 2022-11-19 (×4): 100 mg via ORAL
  Filled 2022-11-15 (×4): qty 1

## 2022-11-15 MED ORDER — SODIUM CHLORIDE 0.9 % IV BOLUS
1000.0000 mL | Freq: Once | INTRAVENOUS | Status: AC
  Administered 2022-11-16: 1000 mL via INTRAVENOUS

## 2022-11-15 MED ORDER — LORAZEPAM 2 MG/ML IJ SOLN: 0.0000 mg | Freq: Two times a day (BID) | INTRAMUSCULAR | Status: DC

## 2022-11-15 MED ORDER — ONDANSETRON HCL 4 MG/2ML IJ SOLN
4.0000 mg | Freq: Once | INTRAMUSCULAR | Status: AC
  Administered 2022-11-16: 4 mg via INTRAVENOUS
  Filled 2022-11-15: qty 2

## 2022-11-15 MED ORDER — LORAZEPAM 1 MG PO TABS
0.0000 mg | ORAL_TABLET | Freq: Four times a day (QID) | ORAL | Status: AC
  Administered 2022-11-16: 1 mg via ORAL
  Administered 2022-11-16 – 2022-11-17 (×3): 2 mg via ORAL
  Filled 2022-11-15 (×3): qty 2
  Filled 2022-11-15: qty 1

## 2022-11-15 MED ORDER — LORAZEPAM 2 MG/ML IJ SOLN: 0.0000 mg | Freq: Four times a day (QID) | INTRAMUSCULAR | Status: AC

## 2022-11-15 MED ORDER — LORAZEPAM 1 MG PO TABS
0.0000 mg | ORAL_TABLET | Freq: Two times a day (BID) | ORAL | Status: DC
  Administered 2022-11-18: 1 mg via ORAL
  Filled 2022-11-15: qty 1

## 2022-11-15 NOTE — ED Triage Notes (Signed)
Patient is A&Ox4 coming from home bib [facility]. C/o of abdominal pain, nausea, vomiting, and diarrhea for 3 days. Patient has not been feeling well she has not been eating or taking her medications.  CBG: 314 RAC 18G 136/76- 124 NS

## 2022-11-16 ENCOUNTER — Emergency Department (HOSPITAL_COMMUNITY): Payer: Medicaid Other

## 2022-11-16 ENCOUNTER — Encounter (HOSPITAL_COMMUNITY): Payer: Self-pay

## 2022-11-16 DIAGNOSIS — E440 Moderate protein-calorie malnutrition: Secondary | ICD-10-CM | POA: Diagnosis present

## 2022-11-16 DIAGNOSIS — I10 Essential (primary) hypertension: Secondary | ICD-10-CM | POA: Diagnosis present

## 2022-11-16 DIAGNOSIS — K529 Noninfective gastroenteritis and colitis, unspecified: Secondary | ICD-10-CM | POA: Diagnosis present

## 2022-11-16 DIAGNOSIS — N3000 Acute cystitis without hematuria: Secondary | ICD-10-CM

## 2022-11-16 DIAGNOSIS — Z91128 Patient's intentional underdosing of medication regimen for other reason: Secondary | ICD-10-CM | POA: Diagnosis not present

## 2022-11-16 DIAGNOSIS — T383X6A Underdosing of insulin and oral hypoglycemic [antidiabetic] drugs, initial encounter: Secondary | ICD-10-CM | POA: Diagnosis present

## 2022-11-16 DIAGNOSIS — E876 Hypokalemia: Secondary | ICD-10-CM | POA: Diagnosis present

## 2022-11-16 DIAGNOSIS — E119 Type 2 diabetes mellitus without complications: Secondary | ICD-10-CM | POA: Diagnosis present

## 2022-11-16 DIAGNOSIS — F1010 Alcohol abuse, uncomplicated: Secondary | ICD-10-CM | POA: Diagnosis present

## 2022-11-16 DIAGNOSIS — B9620 Unspecified Escherichia coli [E. coli] as the cause of diseases classified elsewhere: Secondary | ICD-10-CM | POA: Diagnosis present

## 2022-11-16 DIAGNOSIS — Z79899 Other long term (current) drug therapy: Secondary | ICD-10-CM | POA: Diagnosis not present

## 2022-11-16 DIAGNOSIS — F332 Major depressive disorder, recurrent severe without psychotic features: Secondary | ICD-10-CM | POA: Diagnosis present

## 2022-11-16 DIAGNOSIS — M79672 Pain in left foot: Secondary | ICD-10-CM | POA: Diagnosis present

## 2022-11-16 DIAGNOSIS — Z794 Long term (current) use of insulin: Secondary | ICD-10-CM | POA: Diagnosis not present

## 2022-11-16 DIAGNOSIS — F1410 Cocaine abuse, uncomplicated: Secondary | ICD-10-CM | POA: Diagnosis present

## 2022-11-16 DIAGNOSIS — F17210 Nicotine dependence, cigarettes, uncomplicated: Secondary | ICD-10-CM | POA: Diagnosis present

## 2022-11-16 LAB — CBC WITH DIFFERENTIAL/PLATELET
Abs Immature Granulocytes: 0.05 10*3/uL (ref 0.00–0.07)
Basophils Absolute: 0 10*3/uL (ref 0.0–0.1)
Basophils Relative: 0 %
Eosinophils Absolute: 0 10*3/uL (ref 0.0–0.5)
Eosinophils Relative: 0 %
HCT: 43.8 % (ref 36.0–46.0)
Hemoglobin: 14.5 g/dL (ref 12.0–15.0)
Immature Granulocytes: 0 %
Lymphocytes Relative: 13 %
Lymphs Abs: 1.7 10*3/uL (ref 0.7–4.0)
MCH: 32.2 pg (ref 26.0–34.0)
MCHC: 33.1 g/dL (ref 30.0–36.0)
MCV: 97.1 fL (ref 80.0–100.0)
Monocytes Absolute: 1.7 10*3/uL — ABNORMAL HIGH (ref 0.1–1.0)
Monocytes Relative: 13 %
Neutro Abs: 9.3 10*3/uL — ABNORMAL HIGH (ref 1.7–7.7)
Neutrophils Relative %: 74 %
Platelets: 331 10*3/uL (ref 150–400)
RBC: 4.51 MIL/uL (ref 3.87–5.11)
RDW: 13 % (ref 11.5–15.5)
WBC: 12.7 10*3/uL — ABNORMAL HIGH (ref 4.0–10.5)
nRBC: 0 % (ref 0.0–0.2)

## 2022-11-16 LAB — URINALYSIS, ROUTINE W REFLEX MICROSCOPIC
Bilirubin Urine: NEGATIVE
Glucose, UA: >=500 mg/dL — AB
Ketones, ur: 20 mg/dL — AB
Nitrite: NEGATIVE
Protein, ur: 30 mg/dL — AB
Specific Gravity, Urine: 1.044 — ABNORMAL HIGH (ref 1.005–1.030)
WBC, UA: >50 WBC/hpf (ref 0–5)
pH: 6 (ref 5.0–8.0)

## 2022-11-16 LAB — COMPREHENSIVE METABOLIC PANEL
ALT: 12 U/L (ref 0–44)
ALT: 11 U/L (ref 0–44)
AST: 12 U/L — ABNORMAL LOW (ref 15–41)
AST: 10 U/L — ABNORMAL LOW (ref 15–41)
Albumin: 3.5 g/dL (ref 3.5–5.0)
Albumin: 2.9 g/dL — ABNORMAL LOW (ref 3.5–5.0)
Alkaline Phosphatase: 103 U/L (ref 38–126)
Alkaline Phosphatase: 98 U/L (ref 38–126)
Anion gap: 14 (ref 5–15)
Anion gap: 11 (ref 5–15)
BUN: 11 mg/dL (ref 8–23)
BUN: 8 mg/dL (ref 8–23)
CO2: 22 mmol/L (ref 22–32)
CO2: 20 mmol/L — ABNORMAL LOW (ref 22–32)
Calcium: 8.6 mg/dL — ABNORMAL LOW (ref 8.9–10.3)
Calcium: 7.7 mg/dL — ABNORMAL LOW (ref 8.9–10.3)
Chloride: 96 mmol/L — ABNORMAL LOW (ref 98–111)
Chloride: 102 mmol/L (ref 98–111)
Creatinine, Ser: 0.61 mg/dL (ref 0.44–1.00)
Creatinine, Ser: 0.6 mg/dL (ref 0.44–1.00)
GFR, Estimated: >60 mL/min (ref >60)
GFR, Estimated: >60 mL/min (ref >60)
Glucose, Bld: 279 mg/dL — ABNORMAL HIGH (ref 70–99)
Glucose, Bld: 248 mg/dL — ABNORMAL HIGH (ref 70–99)
Potassium: 3.3 mmol/L — ABNORMAL LOW (ref 3.5–5.1)
Potassium: 3.1 mmol/L — ABNORMAL LOW (ref 3.5–5.1)
Sodium: 132 mmol/L — ABNORMAL LOW (ref 135–145)
Sodium: 133 mmol/L — ABNORMAL LOW (ref 135–145)
Total Bilirubin: 0.8 mg/dL (ref 0.3–1.2)
Total Bilirubin: 0.6 mg/dL (ref 0.3–1.2)
Total Protein: 8.2 g/dL — ABNORMAL HIGH (ref 6.5–8.1)
Total Protein: 6.9 g/dL (ref 6.5–8.1)

## 2022-11-16 LAB — BLOOD GAS, VENOUS
Acid-base deficit: 0.1 mmol/L (ref 0.0–2.0)
Bicarbonate: 23.9 mmol/L (ref 20.0–28.0)
O2 Saturation: 91.1 %
Patient temperature: 37
pCO2, Ven: 36 mmHg — ABNORMAL LOW (ref 44–60)
pH, Ven: 7.43 (ref 7.25–7.43)
pO2, Ven: 59 mmHg — ABNORMAL HIGH (ref 32–45)

## 2022-11-16 LAB — GLUCOSE, CAPILLARY
Glucose-Capillary: 164 mg/dL — ABNORMAL HIGH (ref 70–99)
Glucose-Capillary: 159 mg/dL — ABNORMAL HIGH (ref 70–99)

## 2022-11-16 LAB — CBG MONITORING, ED
Glucose-Capillary: 250 mg/dL — ABNORMAL HIGH (ref 70–99)
Glucose-Capillary: 309 mg/dL — ABNORMAL HIGH (ref 70–99)
Glucose-Capillary: 226 mg/dL — ABNORMAL HIGH (ref 70–99)

## 2022-11-16 LAB — CBC
HCT: 39.2 % (ref 36.0–46.0)
Hemoglobin: 12.9 g/dL (ref 12.0–15.0)
MCH: 32 pg (ref 26.0–34.0)
MCHC: 32.9 g/dL (ref 30.0–36.0)
MCV: 97.3 fL (ref 80.0–100.0)
Platelets: 307 10*3/uL (ref 150–400)
RBC: 4.03 MIL/uL (ref 3.87–5.11)
RDW: 12.9 % (ref 11.5–15.5)
WBC: 11.6 10*3/uL — ABNORMAL HIGH (ref 4.0–10.5)
nRBC: 0 % (ref 0.0–0.2)

## 2022-11-16 LAB — LACTIC ACID, PLASMA
Lactic Acid, Venous: 1.1 mmol/L (ref 0.5–1.9)
Lactic Acid, Venous: 0.8 mmol/L (ref 0.5–1.9)

## 2022-11-16 LAB — RAPID URINE DRUG SCREEN, HOSP PERFORMED
Amphetamines: NOT DETECTED
Barbiturates: NOT DETECTED
Benzodiazepines: NOT DETECTED
Cocaine: POSITIVE — AB
Opiates: NOT DETECTED
Tetrahydrocannabinol: POSITIVE — AB

## 2022-11-16 LAB — HEMOGLOBIN A1C
Hgb A1c MFr Bld: 9.1 % — ABNORMAL HIGH (ref 4.8–5.6)
Mean Plasma Glucose: 214.47 mg/dL

## 2022-11-16 LAB — HIV ANTIBODY (ROUTINE TESTING W REFLEX): HIV Screen 4th Generation wRfx: NONREACTIVE

## 2022-11-16 LAB — PHOSPHORUS: Phosphorus: 2.1 mg/dL — ABNORMAL LOW (ref 2.5–4.6)

## 2022-11-16 LAB — ETHANOL: Alcohol, Ethyl (B): <10 mg/dL (ref <10)

## 2022-11-16 LAB — MAGNESIUM: Magnesium: 1.9 mg/dL (ref 1.7–2.4)

## 2022-11-16 LAB — LIPASE, BLOOD: Lipase: 24 U/L (ref 11–51)

## 2022-11-16 MED ORDER — METRONIDAZOLE 500 MG/100ML IV SOLN
500.0000 mg | Freq: Two times a day (BID) | INTRAVENOUS | Status: DC
  Administered 2022-11-16 – 2022-11-19 (×6): 500 mg via INTRAVENOUS
  Filled 2022-11-16 (×7): qty 100

## 2022-11-16 MED ORDER — NICOTINE 21 MG/24HR TD PT24
21.0000 mg | MEDICATED_PATCH | Freq: Every day | TRANSDERMAL | Status: DC | PRN
  Administered 2022-11-16 – 2022-11-18 (×3): 21 mg via TRANSDERMAL
  Filled 2022-11-16 (×3): qty 1

## 2022-11-16 MED ORDER — IOHEXOL 300 MG/ML  SOLN
100.0000 mL | Freq: Once | INTRAMUSCULAR | Status: AC | PRN
  Administered 2022-11-16: 100 mL via INTRAVENOUS

## 2022-11-16 MED ORDER — ENOXAPARIN SODIUM 40 MG/0.4ML IJ SOSY
40.0000 mg | PREFILLED_SYRINGE | INTRAMUSCULAR | Status: DC
  Administered 2022-11-16 – 2022-11-18 (×3): 40 mg via SUBCUTANEOUS
  Filled 2022-11-16 (×3): qty 0.4

## 2022-11-16 MED ORDER — SODIUM CHLORIDE 0.9 % IV SOLN
2.0000 g | INTRAVENOUS | Status: DC
  Administered 2022-11-17 – 2022-11-19 (×3): 2 g via INTRAVENOUS
  Filled 2022-11-16 (×3): qty 20

## 2022-11-16 MED ORDER — ONDANSETRON HCL 4 MG/2ML IJ SOLN: 4.0000 mg | Freq: Four times a day (QID) | INTRAMUSCULAR | Status: DC | PRN

## 2022-11-16 MED ORDER — SODIUM CHLORIDE 0.9 % IV SOLN
1.0000 g | Freq: Once | INTRAVENOUS | Status: AC
  Administered 2022-11-16: 1 g via INTRAVENOUS
  Filled 2022-11-16: qty 10

## 2022-11-16 MED ORDER — SODIUM CHLORIDE 0.9 % IV BOLUS
1000.0000 mL | Freq: Once | INTRAVENOUS | Status: AC
  Administered 2022-11-16: 1000 mL via INTRAVENOUS

## 2022-11-16 MED ORDER — POTASSIUM CHLORIDE CRYS ER 20 MEQ PO TBCR: 20.0000 meq | EXTENDED_RELEASE_TABLET | Freq: Once | ORAL | Status: DC

## 2022-11-16 MED ORDER — SODIUM CHLORIDE 0.9 % IV BOLUS: 1000.0000 mL | Freq: Once | INTRAVENOUS | Status: DC

## 2022-11-16 MED ORDER — ACETAMINOPHEN 650 MG RE SUPP: 650.0000 mg | Freq: Four times a day (QID) | RECTAL | Status: DC | PRN

## 2022-11-16 MED ORDER — INSULIN DETEMIR 100 UNIT/ML ~~LOC~~ SOLN
20.0000 [IU] | Freq: Two times a day (BID) | SUBCUTANEOUS | Status: DC
  Administered 2022-11-16 – 2022-11-19 (×6): 20 [IU] via SUBCUTANEOUS
  Filled 2022-11-16 (×7): qty 0.2

## 2022-11-16 MED ORDER — MAGNESIUM SULFATE 2 GM/50ML IV SOLN: 2.0000 g | Freq: Once | INTRAVENOUS | Status: DC

## 2022-11-16 MED ORDER — ONDANSETRON HCL 4 MG PO TABS: 4.0000 mg | ORAL_TABLET | Freq: Four times a day (QID) | ORAL | Status: DC | PRN

## 2022-11-16 MED ORDER — THIAMINE HCL 100 MG/ML IJ SOLN
100.0000 mg | Freq: Every day | INTRAMUSCULAR | Status: DC
Start: 2022-11-16 — End: 2022-11-16

## 2022-11-16 MED ORDER — PANTOPRAZOLE SODIUM 40 MG IV SOLR
40.0000 mg | Freq: Once | INTRAVENOUS | Status: AC
  Administered 2022-11-16: 40 mg via INTRAVENOUS
  Filled 2022-11-16: qty 10

## 2022-11-16 MED ORDER — INSULIN ASPART 100 UNIT/ML IJ SOLN
0.0000 [IU] | Freq: Three times a day (TID) | INTRAMUSCULAR | Status: DC
  Administered 2022-11-16: 7 [IU] via SUBCUTANEOUS
  Administered 2022-11-16: 3 [IU] via SUBCUTANEOUS
  Administered 2022-11-16 – 2022-11-17 (×2): 2 [IU] via SUBCUTANEOUS
  Administered 2022-11-18: 1 [IU] via SUBCUTANEOUS
  Filled 2022-11-16: qty 0.09

## 2022-11-16 MED ORDER — POTASSIUM CHLORIDE IN NACL 40-0.9 MEQ/L-% IV SOLN
INTRAVENOUS | Status: AC
  Filled 2022-11-16: qty 1000

## 2022-11-16 MED ORDER — ACETAMINOPHEN 325 MG PO TABS
650.0000 mg | ORAL_TABLET | Freq: Four times a day (QID) | ORAL | Status: DC | PRN
  Administered 2022-11-18 – 2022-11-19 (×4): 650 mg via ORAL
  Filled 2022-11-16 (×4): qty 2

## 2022-11-16 MED ORDER — SODIUM CHLORIDE (PF) 0.9 % IJ SOLN
INTRAMUSCULAR | Status: AC
  Filled 2022-11-16: qty 50

## 2022-11-16 MED ORDER — MAGNESIUM SULFATE 2 GM/50ML IV SOLN
2.0000 g | Freq: Once | INTRAVENOUS | Status: AC
  Administered 2022-11-16: 2 g via INTRAVENOUS
  Filled 2022-11-16 (×2): qty 50

## 2022-11-16 MED ORDER — K PHOS MONO-SOD PHOS DI & MONO 155-852-130 MG PO TABS
250.0000 mg | ORAL_TABLET | Freq: Three times a day (TID) | ORAL | Status: AC
  Administered 2022-11-16 (×2): 250 mg via ORAL
  Filled 2022-11-16 (×3): qty 1

## 2022-11-16 MED ORDER — THIAMINE MONONITRATE 100 MG PO TABS
100.0000 mg | ORAL_TABLET | Freq: Every day | ORAL | Status: DC
Start: 2022-11-16 — End: 2022-11-16

## 2022-11-16 NOTE — Inpatient Diabetes Management (Signed)
Inpatient Diabetes Program Recommendations  AACE/ADA: New Consensus Statement on Inpatient Glycemic Control (2015)  Target Ranges:  Prepandial:   less than 140 mg/dL      Peak postprandial:   less than 180 mg/dL (1-2 hours)      Critically ill patients:  140 - 180 mg/dL   Lab Results  Component Value Date   GLUCAP 226 (H) 11/16/2022   HGBA1C 9.1 (H) 11/16/2022    Review of Glycemic Control  Diabetes history: DM 2 Outpatient Diabetes medications: Levemir 30 units bid, Jardiance 10 mg Daily Current orders for Inpatient glycemic control:  Novolog 0-9 units tid  A1c 9.1% on 6/12 PCP , Meredeth Ide, NP at [facility] last visit 02/28/2022 Medication changes made metformin d/c'd  Inpatient Diabetes Program Recommendations:    Watch on current ordered insulin regimen  Spoke with pt at bedisde regarding A1c of 9.1% and glucose control at home. Pt reports not taking medication  for awhile due to not feeling well. Pt would like to try to go back to the [facility] especially to obtain her medications. Will need to consult TOC to ensure this. Pt reports not having a phone and needing assistance with transportation to appointments. Encouraged compliance to lifestyle modifications and medications. Told pt to follow up with [facility].  Discharge Recommendations: Supply/Referral recommendations: Pen needles - standard Glucometer Test strips Lancet device Lancets  Thanks,  Christena Deem RN, MSN, BC-ADM Inpatient Diabetes Coordinator Team Pager [phone] (8a-5p)

## 2022-11-16 NOTE — ED Notes (Addendum)
Secretary called to let them know pt. is on her way

## 2022-11-16 NOTE — H&P (Signed)
History and Physical    Patient: Robin Montgomery ZOX:[id number] DOB: 1960-07-12 DOA: 11/15/2022 DOS: the patient was seen and examined on 11/16/2022 PCP: Claiborne Rigg, NP  Patient coming from: Home  Chief Complaint:  Chief Complaint  Patient presents with   Abdominal Pain   Nausea   Emesis   HPI: Robin Montgomery is a 62 y.o. female with medical history significant of depression, type 2 diabetes, hypertension, seasonal allergies, polysubstance abuse (alcohol, cocaine, tobacco who presented to the emergency department with complaints of epigastric abdominal pain, decreased appetite, nausea, over 20 episodes of emesis and over 20 episodes of diarrhea for the past 2 days, but no fever, night sweats, chills fatigue or malaise.  No travel history or sick contacts. No sore throat, rhinorrhea, dyspnea, wheezing or hemoptysis.  No chest pain, palpitations, diaphoresis, PND, orthopnea or pitting edema of the lower extremities.  No constipation, melena or hematochezia.  No flank pain, dysuria, frequency or hematuria.  No polyuria, polydipsia, polyphagia or blurred vision.  ED course: Initial vital signs were temperature 98.9 F, pulse 9010, respiration 42, BP 153/65 mmHg O2 sat 97% on room air.  The patient received ceftriaxone 1 g IVPB,ondansetron 4 mg IVP, pantoprazole 40 mg IVP and 2000 mL of normal saline bolus.  CIWA protocol preemptively ordered.  Lab work: UDS is positive for cocaine and THC.  Urine analysis was hazy with increased specific gravity of 1.044, glucose of more than 500, ketones of 20 and protein of 30 mg/dL.  Small hemoglobin, moderate leukocyte esterase, more than 50 WBC and many bacteria on microscopic examination.  CBC showed a white count 12.7 with 74% neutrophils, hemoglobin 14.5 g/dL platelets 811.  Alcohol level, lactic acid and lipase were normal.  CMP showed a potassium of 3.3 mmol/L, glucose 279 mg/dL, total protein 8.2 g/dL and AST 12 units/L.  The rest of the  measurements are normal after sodium, chloride and calcium correction.  Phosphorus 2.1 and magnesium 1.9 mg/dL.  Imaging: CT abdomen/pelvis with contrast with no acute findings in the abdomen or pelvis.  There was left colonic diverticulosis.  Aortic atherosclerosis.   Review of Systems: As mentioned in the history of present illness. All other systems reviewed and are negative. Past Medical History:  Diagnosis Date   Depression    Diabetes mellitus without complication (HCC)    Hypertension    Past Surgical History:  Procedure Laterality Date   CHOLECYSTECTOMY     Social History:  reports that she has been smoking cigarettes. She started smoking about 50 years ago. She has a 12.00 pack-year smoking history. She has quit using smokeless tobacco.  Her smokeless tobacco use included snuff. She reports current alcohol use. She reports current drug use. Drugs: Cocaine and Marijuana.  Allergies  Allergen Reactions   Aspirin Nausea And Vomiting    Upset stomach    No family history on file.  Prior to Admission medications   Medication Sig Start Date End Date Taking? Authorizing Provider  acetaminophen (TYLENOL) 500 MG tablet Take 1 tablet (500 mg total) by mouth every 6 (six) hours as needed. Patient taking differently: Take 500 mg by mouth every 6 (six) hours as needed for moderate pain. 08/19/19  Yes Lorelee New, PA-C  Accu-Chek Softclix Lancets lancets Use as instructed to check blood sugar twice daily. E11.65 02/28/22   Claiborne Rigg, NP  ARIPiprazole (ABILIFY) 10 MG tablet TAKE ONE TABLET BY MOUTH EVERYDAY AT BEDTIME Patient not taking: Reported on 11/16/2022 02/14/22  Toy Cookey E, NP  atorvastatin (LIPITOR) 40 MG tablet TAKE ONE TABLET BY MOUTH ONCE DAILY FOR CHOLESTEROL Patient not taking: Reported on 11/16/2022 07/14/22   Claiborne Rigg, NP  AUSTEDO 12 MG TABS TAKE THREE TABLETS BY MOUTH ONCE DAILY Patient not taking: Reported on 11/16/2022 02/14/22   Shanna Cisco, NP  Blood Glucose Monitoring Suppl (ACCU-CHEK GUIDE ME) w/Device KIT Use to check blood sugar twice daily. E11.65 02/28/22   Claiborne Rigg, NP  Blood Pressure Monitor DEVI Please provide patient with insurance approved blood pressure monitor. Patient not taking: Reported on 02/28/2022 01/26/21   Claiborne Rigg, NP  empagliflozin (JARDIANCE) 10 MG TABS tablet Take 1 tablet (10 mg total) by mouth daily before breakfast. Patient not taking: Reported on 11/16/2022 02/28/22   Claiborne Rigg, NP  glucose blood (ACCU-CHEK GUIDE) test strip Use to check blood sugar twice daily. E11.65 02/28/22   Claiborne Rigg, NP  insulin detemir (LEVEMIR FLEXTOUCH) 100 UNIT/ML FlexPen Inject 30 Units into the skin 2 (two) times daily. For diabetes Patient not taking: Reported on 11/16/2022 02/28/22   Claiborne Rigg, NP  Insulin Pen Needle (TECHLITE PEN NEEDLES) 31G X 5 MM MISC use as instructed.inject into the skin once nightly 01/28/21   Claiborne Rigg, NP  lisinopril (ZESTRIL) 40 MG tablet Take 1 tablet (40 mg total) by mouth every morning. Please make PCP appointment. Patient not taking: Reported on 11/16/2022 10/18/22   Hoy Register, MD  metFORMIN (GLUCOPHAGE-XR) 500 MG 24 hr tablet TAKE ONE TABLET BY MOUTH ONCE DAILY Patient not taking: Reported on 11/16/2022 07/14/22   Claiborne Rigg, NP  Misc. Devices (OFFSET CANE) MISC Please provide patient with single cane. M70.62 01/26/21   Claiborne Rigg, NP  naproxen (NAPROSYN) 375 MG tablet Take 1 tablet (375 mg total) by mouth 2 (two) times daily. Patient not taking: Reported on 11/16/2022 02/11/21   Linwood Dibbles, MD  omeprazole (PRILOSEC) 40 MG capsule TAKE ONE CAPSULE BY MOUTH EVERY EVENING FOR acid reflux Patient not taking: Reported on 11/16/2022 07/14/22   Claiborne Rigg, NP  SYMBICORT 160-4.5 MCG/ACT inhaler INHALE TWO PUFFS BY MOUTH INTO LUNGS IN THE MORNING AND AT bedtime Needs appointment for further refills Patient not taking: Reported on 11/16/2022 10/18/22    Hoy Register, MD  traZODone (DESYREL) 100 MG tablet Take 1 tablet (100 mg total) by mouth at bedtime. Please schedule PCP appointment for more refills. Patient not taking: Reported on 11/16/2022 08/16/22   Hoy Register, MD  traZODone (DESYREL) 50 MG tablet TAKE 1/2 TABLET BY MOUTH EVERYDAY AT BEDTIME FOR SLEEP Patient not taking: Reported on 11/16/2022 07/20/22   Hoy Register, MD  VENTOLIN HFA 108 (90 Base) MCG/ACT inhaler INHALE 1 PUFF BY MOUTH INTO LUNGS EVERY 6 HOURS AS NEEDED FOR WHEEZING AND/OR SHORTNESS OF BREATH Needs appointment for further refills Patient not taking: Reported on 11/16/2022 08/29/22   Claiborne Rigg, NP    Physical Exam: Vitals:   11/16/22 0230 11/16/22 0400 11/16/22 0600 11/16/22 0703  BP: (!) 156/86 (!) 154/82  (!) 154/82  Pulse: (!) 114 (!) 111  (!) 110  Resp: (!) 27 (!) 26    Temp:   98.7 F (37.1 C)   TempSrc:   Oral   SpO2: 97% 96%     Physical Exam Vitals and nursing note reviewed.  Constitutional:      General: She is awake. She is not in acute distress.    Appearance: She is  well-developed and normal weight.  HENT:     Head: Normocephalic.     Nose: No rhinorrhea.     Mouth/Throat:     Mouth: Mucous membranes are dry.  Eyes:     General: No scleral icterus.    Pupils: Pupils are equal, round, and reactive to light.  Neck:     Vascular: No JVD.  Cardiovascular:     Rate and Rhythm: Normal rate and regular rhythm.     Heart sounds: S1 normal and S2 normal.  Pulmonary:     Effort: Pulmonary effort is normal.     Breath sounds: Normal breath sounds. No wheezing, rhonchi or rales.  Abdominal:     General: Bowel sounds are normal.     Palpations: Abdomen is soft.     Tenderness: There is abdominal tenderness. There is no right CVA tenderness or left CVA tenderness.  Musculoskeletal:     Cervical back: Neck supple.     Right lower leg: No edema.     Left lower leg: No edema.  Skin:    General: Skin is warm and dry.  Neurological:      General: No focal deficit present.     Mental Status: She is alert and oriented to person, place, and time.  Psychiatric:        Mood and Affect: Mood normal.        Behavior: Behavior normal. Behavior is cooperative.   Data Reviewed:  Results are pending, will review when available.  Assessment and Plan: Principal Problem:   Acute gastroenteritis Admit to telemetry/inpatient. Continue IV fluids. Analgesics as needed. Antiemetics as needed. Continue ceftriaxone 1 g every 24 hours.   Continue metronidazole 500 mg IVPB q 12 hr. Follow-up blood culture and sensitivity Follow CBC and CMP in a.m.  Active Problems:   Acute cystitis On ceftriaxone. Follow-up urine culture and sensitivity.    Alcohol abuse CIWA protocol with lorazepam. Magnesium sulfate supplementation. Folate, MVI and thiamine. Consult TOC team. Alcohol cessation advised.    Hypokalemia  Secondary to GI losses. Replacing. Follow-up potassium level.    Hypophosphatemia  In the setting of EtOH consumption. Supplementing. Follow-up phosphorus level as needed.    Moderate protein malnutrition (HCC)  In the setting of alcohol abuse. Alcohol cessation. Protein supplementation.    Diabetes mellitus type 2, insulin dependent (HCC) Has not been using insulin, Jardiance or metformin. Resume Levemir at a lower dose 20 units SQ twice daily. Carbohydrate modified diet. CBG monitoring with RI sliding scale. Follow-up hemoglobin A1c level.    Cocaine abuse (HCC) Cessation advised. Consult TOC and behavioral health.    Tobacco dependence NicoDerm patch as needed ordered. Tobacco cessation advised.    MDD (major depressive disorder), recurrent severe, without psychosis (HCC) Off medications. Psychiatry consultation requested.    Essential hypertension Currently not on antihypertensives. As needed antihypertensives or may start amlodipine.      Advance Care Planning:   Code Status: Full Code    Consults:   Family Communication:   Severity of Illness: The appropriate patient status for this patient is INPATIENT. Inpatient status is judged to be reasonable and necessary in order to provide the required intensity of service to ensure the patient's safety. The patient's presenting symptoms, physical exam findings, and initial radiographic and laboratory data in the context of their chronic comorbidities is felt to place them at high risk for further clinical deterioration. Furthermore, it is not anticipated that the patient will be medically stable for discharge from the  hospital within 2 midnights of admission.   * I certify that at the point of admission it is my clinical judgment that the patient will require inpatient hospital care spanning beyond 2 midnights from the point of admission due to high intensity of service, high risk for further deterioration and high frequency of surveillance required.*  Author: Bobette Mo, MD 11/16/2022 7:08 AM  For on call review [email or web address].   This document was prepared using Dragon voice recognition software and may contain some unintended transcription errors.

## 2022-11-16 NOTE — ED Provider Notes (Signed)
[facility] EMERGENCY DEPARTMENT AT [facility] Provider Note   CSN: [id number] Arrival date & time: 11/15/22  2200     History  Chief Complaint  Patient presents with   Abdominal Pain   Nausea   Emesis    Robin Montgomery is a 62 y.o. female.  HPI   Patient with medical history including depression, hypertension, diabetes, presenting with complaints of nausea vomiting diarrhea that started 3 days ago.  Patient states that she has decreased appetite and has been able to tolerate p.o., she denies any bloody emesis or coffee-ground emesis denies any dark tarry stools or bloody stools.  She denies any recent antibiotic use no history of C. difficile, she has no associated fevers chills cough congestion body aches.  She notes that she is has not been taking her diabetes medication for the last week states that she "just did not want to", she also notes that she has been drinking 1/5 of liquor daily for the last month, last drink was yesterday, had not drink anything today, states that she has intermittent issues with alcohol consumption.  Patient states that she is never had be hospitalized for withdrawals nor has she ever been hospitalized elevated blood sugars.  She does note that she has been having increased thirst and urination.  She denies any thoughts to herself or others.    Home Medications Prior to Admission medications   Medication Sig Start Date End Date Taking? Authorizing Provider  acetaminophen (TYLENOL) 500 MG tablet Take 1 tablet (500 mg total) by mouth every 6 (six) hours as needed. Patient taking differently: Take 500 mg by mouth every 6 (six) hours as needed for moderate pain. 08/19/19  Yes Lorelee New, PA-C  Accu-Chek Softclix Lancets lancets Use as instructed to check blood sugar twice daily. E11.65 02/28/22   Claiborne Rigg, NP  ARIPiprazole (ABILIFY) 10 MG tablet TAKE ONE TABLET BY MOUTH EVERYDAY AT BEDTIME Patient not taking: Reported on 11/16/2022  02/14/22   Shanna Cisco, NP  atorvastatin (LIPITOR) 40 MG tablet TAKE ONE TABLET BY MOUTH ONCE DAILY FOR CHOLESTEROL Patient not taking: Reported on 11/16/2022 07/14/22   Claiborne Rigg, NP  AUSTEDO 12 MG TABS TAKE THREE TABLETS BY MOUTH ONCE DAILY Patient not taking: Reported on 11/16/2022 02/14/22   Shanna Cisco, NP  Blood Glucose Monitoring Suppl (ACCU-CHEK GUIDE ME) w/Device KIT Use to check blood sugar twice daily. E11.65 02/28/22   Claiborne Rigg, NP  Blood Pressure Monitor DEVI Please provide patient with insurance approved blood pressure monitor. Patient not taking: Reported on 02/28/2022 01/26/21   Claiborne Rigg, NP  empagliflozin (JARDIANCE) 10 MG TABS tablet Take 1 tablet (10 mg total) by mouth daily before breakfast. Patient not taking: Reported on 11/16/2022 02/28/22   Claiborne Rigg, NP  glucose blood (ACCU-CHEK GUIDE) test strip Use to check blood sugar twice daily. E11.65 02/28/22   Claiborne Rigg, NP  insulin detemir (LEVEMIR FLEXTOUCH) 100 UNIT/ML FlexPen Inject 30 Units into the skin 2 (two) times daily. For diabetes Patient not taking: Reported on 11/16/2022 02/28/22   Claiborne Rigg, NP  Insulin Pen Needle (TECHLITE PEN NEEDLES) 31G X 5 MM MISC use as instructed.inject into the skin once nightly 01/28/21   Claiborne Rigg, NP  lisinopril (ZESTRIL) 40 MG tablet Take 1 tablet (40 mg total) by mouth every morning. Please make PCP appointment. Patient not taking: Reported on 11/16/2022 10/18/22   Hoy Register, MD  metFORMIN [facility])  500 MG 24 hr tablet TAKE ONE TABLET BY MOUTH ONCE DAILY Patient not taking: Reported on 11/16/2022 07/14/22   Claiborne Rigg, NP  Misc. Devices (OFFSET CANE) MISC Please provide patient with single cane. M70.62 01/26/21   Claiborne Rigg, NP  naproxen (NAPROSYN) 375 MG tablet Take 1 tablet (375 mg total) by mouth 2 (two) times daily. Patient not taking: Reported on 11/16/2022 02/11/21   Linwood Dibbles, MD  omeprazole (PRILOSEC) 40 MG  capsule TAKE ONE CAPSULE BY MOUTH EVERY EVENING FOR acid reflux Patient not taking: Reported on 11/16/2022 07/14/22   Claiborne Rigg, NP  SYMBICORT 160-4.5 MCG/ACT inhaler INHALE TWO PUFFS BY MOUTH INTO LUNGS IN THE MORNING AND AT bedtime Needs appointment for further refills Patient not taking: Reported on 11/16/2022 10/18/22   Hoy Register, MD  traZODone (DESYREL) 100 MG tablet Take 1 tablet (100 mg total) by mouth at bedtime. Please schedule PCP appointment for more refills. Patient not taking: Reported on 11/16/2022 08/16/22   Hoy Register, MD  traZODone (DESYREL) 50 MG tablet TAKE 1/2 TABLET BY MOUTH EVERYDAY AT BEDTIME FOR SLEEP Patient not taking: Reported on 11/16/2022 07/20/22   Hoy Register, MD  VENTOLIN HFA 108 (90 Base) MCG/ACT inhaler INHALE 1 PUFF BY MOUTH INTO LUNGS EVERY 6 HOURS AS NEEDED FOR WHEEZING AND/OR SHORTNESS OF BREATH Needs appointment for further refills Patient not taking: Reported on 11/16/2022 08/29/22   Claiborne Rigg, NP      Allergies    Aspirin    Review of Systems   Review of Systems  Constitutional:  Negative for chills and fever.  Respiratory:  Negative for shortness of breath.   Cardiovascular:  Negative for chest pain.  Gastrointestinal:  Positive for abdominal pain, nausea and vomiting.  Neurological:  Negative for headaches.    Physical Exam Updated Vital Signs BP (!) 154/82   Pulse (!) 111   Temp 98.9 F (37.2 C) (Oral)   Resp (!) 26   SpO2 96%  Physical Exam Vitals and nursing note reviewed.  Constitutional:      General: She is not in acute distress.    Appearance: She is not ill-appearing.  HENT:     Head: Normocephalic and atraumatic.     Nose: No congestion.  Eyes:     Conjunctiva/sclera: Conjunctivae normal.  Cardiovascular:     Rate and Rhythm: Regular rhythm. Tachycardia present.     Pulses: Normal pulses.     Heart sounds: No murmur heard.    No friction rub. No gallop.  Pulmonary:     Effort: No respiratory  distress.     Breath sounds: No wheezing, rhonchi or rales.  Abdominal:     Palpations: Abdomen is soft.     Tenderness: There is abdominal tenderness. There is no right CVA tenderness or left CVA tenderness.     Comments: Abdomen nondistended, soft, notable epigastric tenderness without guarding rebound is or peritoneal sign.  Skin:    General: Skin is warm and dry.  Neurological:     Mental Status: She is alert.  Psychiatric:        Mood and Affect: Mood normal.     ED Results / Procedures / Treatments   Labs (all labs ordered are listed, but only abnormal results are displayed) Labs Reviewed  COMPREHENSIVE METABOLIC PANEL - Abnormal; Notable for the following components:      Result Value   Sodium 132 (*)    Potassium 3.3 (*)    Chloride 96 (*)  Glucose, Bld 279 (*)    Calcium 8.6 (*)    Total Protein 8.2 (*)    AST 12 (*)    All other components within normal limits  CBC WITH DIFFERENTIAL/PLATELET - Abnormal; Notable for the following components:   WBC 12.7 (*)    Neutro Abs 9.3 (*)    Monocytes Absolute 1.7 (*)    All other components within normal limits  URINALYSIS, ROUTINE W REFLEX MICROSCOPIC - Abnormal; Notable for the following components:   APPearance HAZY (*)    Specific Gravity, Urine 1.044 (*)    Glucose, UA >=500 (*)    Hgb urine dipstick SMALL (*)    Ketones, ur 20 (*)    Protein, ur 30 (*)    Leukocytes,Ua MODERATE (*)    Bacteria, UA MANY (*)    All other components within normal limits  BLOOD GAS, VENOUS - Abnormal; Notable for the following components:   pCO2, Ven 36 (*)    pO2, Ven 59 (*)    All other components within normal limits  RAPID URINE DRUG SCREEN, HOSP PERFORMED - Abnormal; Notable for the following components:   Cocaine POSITIVE (*)    Tetrahydrocannabinol POSITIVE (*)    All other components within normal limits  URINE CULTURE  ETHANOL  LIPASE, BLOOD  LACTIC ACID, PLASMA  LACTIC ACID, PLASMA    EKG EKG  Interpretation  Date/Time:  Tuesday November 15 2022 22:15:02 EDT Ventricular Rate:  110 PR Interval:  117 QRS Duration: 82 QT Interval:  379 QTC Calculation: 513 R Axis:   78 Text Interpretation: Sinus tachycardia Probable left atrial enlargement Prolonged QT interval Confirmed by Palumbo, April ([id number]) on 11/16/2022 12:00:00 AM  Radiology CT ABDOMEN PELVIS W CONTRAST  Result Date: 11/16/2022 CLINICAL DATA:  Epigastric pain EXAM: CT ABDOMEN AND PELVIS WITH CONTRAST TECHNIQUE: Multidetector CT imaging of the abdomen and pelvis was performed using the standard protocol following bolus administration of intravenous contrast. RADIATION DOSE REDUCTION: This exam was performed according to the departmental dose-optimization program which includes automated exposure control, adjustment of the mA and/or kV according to patient size and/or use of iterative reconstruction technique. CONTRAST:  OMNIPAQUE IOHEXOL 300 MG/ML  SOLN COMPARISON:  02/11/2021 FINDINGS: Lower chest: Dependent atelectasis. No acute abnormality. Small hiatal hernia. Hepatobiliary: No focal liver abnormality is seen. Status post cholecystectomy. No biliary dilatation. Pancreas: No focal abnormality or ductal dilatation. Spleen: No focal abnormality.  Normal size. Adrenals/Urinary Tract: No adrenal abnormality. No focal renal abnormality. No stones or hydronephrosis. Urinary bladder is unremarkable. Stomach/Bowel: Left colonic diverticulosis. No active diverticulitis. Stomach and small bowel decompressed, unremarkable. Vascular/Lymphatic: Aortic atherosclerosis. No evidence of aneurysm or adenopathy. Reproductive: Uterus and adnexa unremarkable.  No mass. Other: No free fluid or free air. Musculoskeletal: No acute bony abnormality. IMPRESSION: No acute findings in the abdomen or pelvis. Left colonic diverticulosis.  No active diverticulitis. Aortic atherosclerosis. Electronically Signed   By: Charlett Nose M.D.   On: 11/16/2022 02:44     Procedures Procedures    Medications Ordered in ED Medications  LORazepam (ATIVAN) injection 0-4 mg ( Intravenous See Alternative 11/16/22 0006)    Or  LORazepam (ATIVAN) tablet 0-4 mg (1 mg Oral Given 11/16/22 0006)  LORazepam (ATIVAN) injection 0-4 mg (has no administration in time range)    Or  LORazepam (ATIVAN) tablet 0-4 mg (has no administration in time range)  thiamine (VITAMIN B1) tablet 100 mg (100 mg Oral Given 11/16/22 0006)    Or  thiamine (VITAMIN B1)  injection 100 mg ( Intravenous See Alternative 11/16/22 0006)  cefTRIAXone (ROCEPHIN) 1 g in sodium chloride 0.9 % 100 mL IVPB (has no administration in time range)  sodium chloride 0.9 % bolus 1,000 mL (1,000 mLs Intravenous New Bag/Given 11/16/22 0000)  ondansetron (ZOFRAN) injection 4 mg (4 mg Intravenous Given 11/16/22 0006)  iohexol (OMNIPAQUE) 300 MG/ML solution 100 mL (100 mLs Intravenous Contrast Given 11/16/22 0149)  sodium chloride 0.9 % bolus 1,000 mL (1,000 mLs Intravenous New Bag/Given 11/16/22 0443)    ED Course/ Medical Decision Making/ A&P                             Medical Decision Making Amount and/or Complexity of Data Reviewed Labs: ordered. Radiology: ordered.  Risk OTC drugs. Prescription drug management. Decision regarding hospitalization.   This patient presents to the ED for concern of abdominal pain, this involves an extensive number of treatment options, and is a complaint that carries with it a high risk of complications and morbidity.  The differential diagnosis includes alcohol withdrawal, pancreatitis, DKA, HSS,    Additional history obtained:  Additional history obtained from N/A External records from outside source obtained and reviewed including internal medicine notes   Co morbidities that complicate the patient evaluation  Diabetes  Social Determinants of Health:  Alcohol dependency    Lab Tests:  I Ordered, and personally interpreted labs.  The pertinent results  include: CBC shows leukocytosis of 12.7, CMP reveals sodium 132 potassium 3.3 glucose 279, lipase 24, lactic 1.1, ethanol negative, pH within normal limits UA positive for leukocytes white blood cells many bacteria   Imaging Studies ordered:  I ordered imaging studies including CT and pelvis I independently visualized and interpreted imaging which showed negative acute findings I agree with the radiologist interpretation   Cardiac Monitoring:  The patient was maintained on a cardiac monitor.  I personally viewed and interpreted the cardiac monitored which showed an underlying rhythm of: EKG without signs of ischemia   Medicines ordered and prescription drug management:  I ordered medication including intravenous fluids I have reviewed the patients home medicines and have made adjustments as needed  Critical Interventions:  N/A   Reevaluation:  Presents with concerns of abdominal pain nausea vomiting, on my exam she is tachycardic, no epigastric pain, concern for possible drawls versus pancreatitis, will obtain screening lab workup, provide with fluids, antiemetics, started on CIWA protocol.  Patient was reassessed, still remains tachycardic, will provide additional fluids.  UA is concerning for UTI, she endorses urinary frequency, will start antibiotics, culture urine.   Patient is reassessed still remains tachycardic, she is nontremulous on my exam, I am concerned that patient may be withdrawing, recommend admission, patient dates that she would like to stop drinking, will admit to medicine for further observation    Consultations Obtained:  I requested consultation with the Dr. Margo Aye,  and discussed lab and imaging findings as well as pertinent plan - they recommend: who will admit the patient.    Test Considered:  N/A    Rule out low suspicion for lower lobe pneumonia as lung sounds are clear bilaterally, will defer imaging at this time.  I have low suspicion for  liver or gallbladder abnormality as liver enzymes, alk phos, T bili all within normal limits.  Low suspicion for pancreatitis as lipase is within normal limits.  Low suspicion for ruptured stomach ulcer as she has no peritoneal sign present on exam.  Suspicion for bowel obstruction, volvulus, diverticulitis, pyelo-, kidney stone is low CT imaging is all negative these findings.  I doubt atypical ACS nonnursing any chest pain, EKG without signs of ischemia.  I doubt PE denies any pleuritic chest pain, shortness of breath,  nonhypoxic, patient is low risk factors.  Patient is tachycardic and slight tachypnea but I suspect this secondary due to withdrawal as, if symptoms not improve after withdrawals are manage would consider following up with [facility] for further assessment.    Dispostion and problem list  After consideration of the diagnostic results and the patients response to treatment, I feel that the patent would benefit from admission.  Alcohol withdrawal-patient suffering from mild alcohol withdrawals, currently on CIWA, patient will need continue monitoring, and outpatient follow-up UTI-patient started on ceftriaxone, will need continued monitoring transition to oral medications.            Final Clinical Impression(s) / ED Diagnoses Final diagnoses:  Acute cystitis without hematuria  Alcohol withdrawal syndrome without complication (HCC)  Polysubstance abuse [facility])    Rx / DC Orders ED Discharge Orders     None         Carroll Sage, PA-C 11/16/22 0444    Palumbo, April, MD 11/16/22 [phone]

## 2022-11-16 NOTE — ED Notes (Signed)
ED TO INPATIENT HANDOFF REPORT  ED Nurse Name and Phone #: Crist Infante, RN [phone]  S Name/Age/Gender Robin Montgomery 62 y.o. female Room/Bed: WA14/WA14  Code Status   Code Status: Full Code  Home/SNF/Other Home Patient oriented to: self, place, time, and situation Is this baseline? Yes   Triage Complete: Triage complete  Chief Complaint Acute cystitis [N30.00]  Triage Note Patient is A&Ox4 coming from home bib [facility]. C/o of abdominal pain, nausea, vomiting, and diarrhea for 3 days. Patient has not been feeling well she has not been eating or taking her medications.  CBG: 314 RAC 18G 136/76- 124 NS    Allergies Allergies  Allergen Reactions   Aspirin Nausea And Vomiting    Upset stomach    Level of Care/Admitting Diagnosis ED Disposition     ED Disposition  Admit   Condition  --   Comment  Hospital Area: [facility] [[id number]]  Level of Care: Telemetry [5]  Admit to tele based on following criteria: Monitor for Ischemic changes  May admit patient to [facility] or [facility] if equivalent level of care is available:: Yes  Covid Evaluation: Asymptomatic - no recent exposure (last 10 days) testing not required  Diagnosis: Acute cystitis [595.0.ICD-9-CM]  Admitting Physician: Darlin Drop [[id number]]  Attending Physician: Darlin Drop [[id number]]  Certification:: I certify this patient will need inpatient services for at least 2 midnights  Estimated Length of Stay: 2          B Medical/Surgery History Past Medical History:  Diagnosis Date   Depression    Diabetes mellitus without complication (HCC)    Hypertension    Past Surgical History:  Procedure Laterality Date   CHOLECYSTECTOMY       A IV Location/Drains/Wounds Patient Lines/Drains/Airways Status     Active Line/Drains/Airways     Name Placement date Placement time Site Days   Peripheral IV 11/15/22 18 G Right Antecubital 11/15/22  2216  Antecubital  1             Intake/Output Last 24 hours No intake or output data in the 24 hours ending 11/16/22 1142  Labs/Imaging Results for orders placed or performed during the hospital encounter of 11/15/22 (from the past 48 hour(s))  Blood gas, venous (at [facility] and [facility])     Status: Abnormal   Collection Time: 11/15/22 11:50 PM  Result Value Ref Range   pH, Ven 7.43 7.25 - 7.43   pCO2, Ven 36 (L) 44 - 60 mmHg   pO2, Ven 59 (H) 32 - 45 mmHg   Bicarbonate 23.9 20.0 - 28.0 mmol/L   Acid-base deficit 0.1 0.0 - 2.0 mmol/L   O2 Saturation 91.1 %   Patient temperature 37.0     Comment: Performed at [facility], [address] [place], [place] [address]  Comprehensive metabolic panel     Status: Abnormal   Collection Time: 11/15/22 11:53 PM  Result Value Ref Range   Sodium 132 (L) 135 - 145 mmol/L   Potassium 3.3 (L) 3.5 - 5.1 mmol/L   Chloride 96 (L) 98 - 111 mmol/L   CO2 22 22 - 32 mmol/L   Glucose, Bld 279 (H) 70 - 99 mg/dL    Comment: Glucose reference range applies only to samples taken after fasting for at least 8 hours.   BUN 11 8 - 23 mg/dL   Creatinine, Ser 3.08 0.44 - 1.00 mg/dL   Calcium 8.6 (L) 8.9 - 10.3 mg/dL  Total Protein 8.2 (H) 6.5 - 8.1 g/dL   Albumin 3.5 3.5 - 5.0 g/dL   AST 12 (L) 15 - 41 U/L   ALT 12 0 - 44 U/L   Alkaline Phosphatase 103 38 - 126 U/L   Total Bilirubin 0.8 0.3 - 1.2 mg/dL   GFR, Estimated >16 >10 mL/min    Comment: (NOTE) Calculated using the CKD-EPI Creatinine Equation (2021)    Anion gap 14 5 - 15    Comment: Performed at [facility], [address], [place], [place] [address]  Ethanol     Status: None   Collection Time: 11/15/22 11:53 PM  Result Value Ref Range   Alcohol, Ethyl (B) <10 <10 mg/dL    Comment: (NOTE) Lowest detectable limit for serum alcohol is 10 mg/dL.  For medical purposes only. Performed at [facility], [address], [place], [place] [address]   Lipase, blood     Status:  None   Collection Time: 11/15/22 11:53 PM  Result Value Ref Range   Lipase 24 11 - 51 U/L    Comment: Performed at [facility], [address], [place], [place] [address]  CBC with Differential     Status: Abnormal   Collection Time: 11/15/22 11:53 PM  Result Value Ref Range   WBC 12.7 (H) 4.0 - 10.5 K/uL   RBC 4.51 3.87 - 5.11 MIL/uL   Hemoglobin 14.5 12.0 - 15.0 g/dL   HCT 82.9 56.2 - 13.0 %   MCV 97.1 80.0 - 100.0 fL   MCH 32.2 26.0 - 34.0 pg   MCHC 33.1 30.0 - 36.0 g/dL   RDW 86.5 78.4 - 69.6 %   Platelets 331 150 - 400 K/uL   nRBC 0.0 0.0 - 0.2 %   Neutrophils Relative % 74 %   Neutro Abs 9.3 (H) 1.7 - 7.7 K/uL   Lymphocytes Relative 13 %   Lymphs Abs 1.7 0.7 - 4.0 K/uL   Monocytes Relative 13 %   Monocytes Absolute 1.7 (H) 0.1 - 1.0 K/uL   Eosinophils Relative 0 %   Eosinophils Absolute 0.0 0.0 - 0.5 K/uL   Basophils Relative 0 %   Basophils Absolute 0.0 0.0 - 0.1 K/uL   WBC Morphology VACUOLATED NEUTROPHILS    Immature Granulocytes 0 %   Abs Immature Granulocytes 0.05 0.00 - 0.07 K/uL    Comment: Performed at [facility], [address], [place], [place] [address]  Lactic acid, plasma     Status: None   Collection Time: 11/15/22 11:53 PM  Result Value Ref Range   Lactic Acid, Venous 1.1 0.5 - 1.9 mmol/L    Comment: Performed at [facility], [address], [place], [place] [address]  Urinalysis, Routine w reflex microscopic -Urine, Clean Catch     Status: Abnormal   Collection Time: 11/16/22  3:00 AM  Result Value Ref Range   Color, Urine YELLOW YELLOW   APPearance HAZY (A) CLEAR   Specific Gravity, Urine 1.044 (H) 1.005 - 1.030   pH 6.0 5.0 - 8.0   Glucose, UA >=500 (A) NEGATIVE mg/dL   Hgb urine dipstick SMALL (A) NEGATIVE   Bilirubin Urine NEGATIVE NEGATIVE   Ketones, ur 20 (A) NEGATIVE mg/dL   Protein, ur 30 (A) NEGATIVE mg/dL   Nitrite NEGATIVE NEGATIVE   Leukocytes,Ua MODERATE (A) NEGATIVE    RBC / HPF 0-5 0 - 5 RBC/hpf   WBC, UA >50 0 - 5 WBC/hpf  Bacteria, UA MANY (A) NONE SEEN   Squamous Epithelial / HPF 0-5 0 - 5 /HPF   Mucus PRESENT     Comment: Performed at [facility], [address] [place], [place] [address]  Urine rapid drug screen (hosp performed)     Status: Abnormal   Collection Time: 11/16/22  3:01 AM  Result Value Ref Range   Opiates NONE DETECTED NONE DETECTED   Cocaine POSITIVE (A) NONE DETECTED   Benzodiazepines NONE DETECTED NONE DETECTED   Amphetamines NONE DETECTED NONE DETECTED   Tetrahydrocannabinol POSITIVE (A) NONE DETECTED   Barbiturates NONE DETECTED NONE DETECTED    Comment: (NOTE) DRUG SCREEN FOR MEDICAL PURPOSES ONLY.  IF CONFIRMATION IS NEEDED FOR ANY PURPOSE, NOTIFY LAB WITHIN 5 DAYS.  LOWEST DETECTABLE LIMITS FOR URINE DRUG SCREEN Drug Class                     Cutoff (ng/mL) Amphetamine and metabolites    1000 Barbiturate and metabolites    200 Benzodiazepine                 200 Opiates and metabolites        300 Cocaine and metabolites        300 THC                            50 Performed at [facility], [address] [place], [place] [address]   Lactic acid, plasma     Status: None   Collection Time: 11/16/22  7:23 AM  Result Value Ref Range   Lactic Acid, Venous 0.8 0.5 - 1.9 mmol/L    Comment: Performed at [facility], [address] [place], [place] [address]  HIV Antibody (routine testing w rflx)     Status: None   Collection Time: 11/16/22  7:23 AM  Result Value Ref Range   HIV Screen 4th Generation wRfx Non Reactive Non Reactive    Comment: Performed at [facility], [address], [place], [place] [address]  Magnesium     Status: None   Collection Time: 11/16/22  7:23 AM  Result Value Ref Range   Magnesium 1.9 1.7 - 2.4 mg/dL    Comment: Performed at [facility], [address], [place], [place] [address]  Phosphorus      Status: Abnormal   Collection Time: 11/16/22  7:23 AM  Result Value Ref Range   Phosphorus 2.1 (L) 2.5 - 4.6 mg/dL    Comment: Performed at [facility], [address] [place], [place] [address]  CBC     Status: Abnormal   Collection Time: 11/16/22  7:23 AM  Result Value Ref Range   WBC 11.6 (H) 4.0 - 10.5 K/uL   RBC 4.03 3.87 - 5.11 MIL/uL   Hemoglobin 12.9 12.0 - 15.0 g/dL   HCT 02.7 25.3 - 66.4 %   MCV 97.3 80.0 - 100.0 fL   MCH 32.0 26.0 - 34.0 pg   MCHC 32.9 30.0 - 36.0 g/dL   RDW 40.3 47.4 - 25.9 %   Platelets 307 150 - 400 K/uL   nRBC 0.0 0.0 - 0.2 %    Comment: Performed at [facility], [address] [place], [place] [address]  Comprehensive metabolic panel     Status: Abnormal   Collection Time: 11/16/22  7:23 AM  Result Value Ref Range  Sodium 133 (L) 135 - 145 mmol/L   Potassium 3.1 (L) 3.5 - 5.1 mmol/L   Chloride 102 98 - 111 mmol/L   CO2 20 (L) 22 - 32 mmol/L   Glucose, Bld 248 (H) 70 - 99 mg/dL    Comment: Glucose reference range applies only to samples taken after fasting for at least 8 hours.   BUN 8 8 - 23 mg/dL   Creatinine, Ser 4.09 0.44 - 1.00 mg/dL   Calcium 7.7 (L) 8.9 - 10.3 mg/dL   Total Protein 6.9 6.5 - 8.1 g/dL   Albumin 2.9 (L) 3.5 - 5.0 g/dL   AST 10 (L) 15 - 41 U/L   ALT 11 0 - 44 U/L   Alkaline Phosphatase 98 38 - 126 U/L   Total Bilirubin 0.6 0.3 - 1.2 mg/dL   GFR, Estimated >81 >19 mL/min    Comment: (NOTE) Calculated using the CKD-EPI Creatinine Equation (2021)    Anion gap 11 5 - 15    Comment: Performed at [facility], [address] [place], [place] [address]  Hemoglobin A1c     Status: Abnormal   Collection Time: 11/16/22  7:23 AM  Result Value Ref Range   Hgb A1c MFr Bld 9.1 (H) 4.8 - 5.6 %    Comment: (NOTE) Pre diabetes:          5.7%-6.4%  Diabetes:              >6.4%  Glycemic control for   <7.0% adults with diabetes    Mean Plasma Glucose 214.47 mg/dL     Comment: Performed at [facility], [address] [place], [place] [address]  CBG monitoring, ED     Status: Abnormal   Collection Time: 11/16/22  8:19 AM  Result Value Ref Range   Glucose-Capillary 250 (H) 70 - 99 mg/dL    Comment: Glucose reference range applies only to samples taken after fasting for at least 8 hours.  CBG monitoring, ED     Status: Abnormal   Collection Time: 11/16/22  8:50 AM  Result Value Ref Range   Glucose-Capillary 309 (H) 70 - 99 mg/dL    Comment: Glucose reference range applies only to samples taken after fasting for at least 8 hours.   CT ABDOMEN PELVIS W CONTRAST  Result Date: 11/16/2022 CLINICAL DATA:  Epigastric pain EXAM: CT ABDOMEN AND PELVIS WITH CONTRAST TECHNIQUE: Multidetector CT imaging of the abdomen and pelvis was performed using the standard protocol following bolus administration of intravenous contrast. RADIATION DOSE REDUCTION: This exam was performed according to the departmental dose-optimization program which includes automated exposure control, adjustment of the mA and/or kV according to patient size and/or use of iterative reconstruction technique. CONTRAST:  OMNIPAQUE IOHEXOL 300 MG/ML  SOLN COMPARISON:  02/11/2021 FINDINGS: Lower chest: Dependent atelectasis. No acute abnormality. Small hiatal hernia. Hepatobiliary: No focal liver abnormality is seen. Status post cholecystectomy. No biliary dilatation. Pancreas: No focal abnormality or ductal dilatation. Spleen: No focal abnormality.  Normal size. Adrenals/Urinary Tract: No adrenal abnormality. No focal renal abnormality. No stones or hydronephrosis. Urinary bladder is unremarkable. Stomach/Bowel: Left colonic diverticulosis. No active diverticulitis. Stomach and small bowel decompressed, unremarkable. Vascular/Lymphatic: Aortic atherosclerosis. No evidence of aneurysm or adenopathy. Reproductive: Uterus and adnexa unremarkable.  No mass. Other: No free fluid or free air.  Musculoskeletal: No acute bony abnormality. IMPRESSION: No acute findings in the abdomen or pelvis. Left colonic diverticulosis.  No active diverticulitis. Aortic atherosclerosis. Electronically Signed  By: Charlett Nose M.D.   On: 11/16/2022 02:44    Pending Labs Unresulted Labs (From admission, onward)     Start     Ordered   11/17/22 0500  CBC  Tomorrow morning,   R        11/16/22 0713   11/17/22 0500  Comprehensive metabolic panel  Tomorrow morning,   R        11/16/22 0713   11/16/22 0323  Urine Culture  Once,   URGENT       Question:  Indication  Answer:  Urgency/frequency   11/16/22 0322            Vitals/Pain Today's Vitals   11/16/22 0958 11/16/22 1018 11/16/22 1030 11/16/22 1059  BP: (!) 147/71  (!) 144/77 (!) 124/101  Pulse: (!) 104  (!) 102 (!) 109  Resp:   (!) 29   Temp:  98 F (36.7 C)    TempSrc:  Oral    SpO2:   96%   PainSc:        Isolation Precautions No active isolations  Medications Medications  LORazepam (ATIVAN) injection 0-4 mg ( Intravenous Not Given 11/16/22 0706)    Or  LORazepam (ATIVAN) tablet 0-4 mg ( Oral See Alternative 11/16/22 0706)  LORazepam (ATIVAN) injection 0-4 mg (has no administration in time range)    Or  LORazepam (ATIVAN) tablet 0-4 mg (has no administration in time range)  thiamine (VITAMIN B1) tablet 100 mg ( Oral See Alternative 11/16/22 0957)    Or  thiamine (VITAMIN B1) injection 100 mg (100 mg Intravenous Given 11/16/22 0957)  enoxaparin (LOVENOX) injection 40 mg (has no administration in time range)  acetaminophen (TYLENOL) tablet 650 mg (has no administration in time range)    Or  acetaminophen (TYLENOL) suppository 650 mg (has no administration in time range)  ondansetron (ZOFRAN) tablet 4 mg (has no administration in time range)    Or  ondansetron (ZOFRAN) injection 4 mg (has no administration in time range)  0.9 % NaCl with KCl 40 mEq / L  infusion ( Intravenous New Bag/Given 11/16/22 0755)  insulin aspart  (novoLOG) injection 0-9 Units (7 Units Subcutaneous Given 11/16/22 0852)  phosphorus (K PHOS NEUTRAL) tablet 250 mg (has no administration in time range)  magnesium sulfate IVPB 2 g 50 mL (has no administration in time range)  sodium chloride 0.9 % bolus 1,000 mL (has no administration in time range)  potassium chloride SA (KLOR-CON M) CR tablet 20 mEq (has no administration in time range)  nicotine (NICODERM CQ - dosed in mg/24 hours) patch 21 mg (has no administration in time range)  metroNIDAZOLE (FLAGYL) IVPB 500 mg (has no administration in time range)  cefTRIAXone (ROCEPHIN) 1 g in sodium chloride 0.9 % 100 mL IVPB (has no administration in time range)  sodium chloride 0.9 % bolus 1,000 mL (0 mLs Intravenous Stopped 11/16/22 0703)  ondansetron (ZOFRAN) injection 4 mg (4 mg Intravenous Given 11/16/22 0006)  iohexol (OMNIPAQUE) 300 MG/ML solution 100 mL (100 mLs Intravenous Contrast Given 11/16/22 0149)  sodium chloride 0.9 % bolus 1,000 mL (0 mLs Intravenous Stopped 11/16/22 0703)  cefTRIAXone (ROCEPHIN) 1 g in sodium chloride 0.9 % 100 mL IVPB (0 g Intravenous Stopped 11/16/22 0706)  pantoprazole (PROTONIX) injection 40 mg (40 mg Intravenous Given 11/16/22 0744)    Mobility walks     Focused Assessments Neuro Assessment Handoff:  Swallow screen pass?  N/A         Neuro Assessment: Within  Defined Limits Neuro Checks:      Has TPA been given? No If patient is a Neuro Trauma and patient is going to OR before floor call report to 4N Charge nurse: [phone] or [phone]   R Recommendations: See Admitting Provider Note  Report given to:   Additional Notes:

## 2022-11-16 NOTE — Discharge Instructions (Addendum)
Please continue to take medications as directed. If your symptoms return, worsen, or persist please call your 6, report to [facility], or contact crisis hotline. Please do not drink alcohol or use any illegal substances while taking prescription medications.    Symptoms to watch out for include depression, difficulty sleeping, decreased need for sleep, poor appetite, mood swings(up and downs), suicidal thoughts, confusion, change in speech, poor decision making/impulsivity.   Remember that each time you use illicit substances and or alcohol your psychosis/mood lability can return. Each state of psychosis is detrimental to your brain cells, making the condition less likely reversible and more difficult to treat.   Resources and assistance to guide patient with appropriate referral processes have been placed in your Follow up contact information.   -You may benefit from community support team to provide stability by reducing further psychiatric, and or substance use symptoms, interventions for coping skills, psychoeducation, and service coordination to community services to include ongoing mental health care.

## 2022-11-16 NOTE — ED Provider Notes (Incomplete)
[place] EMERGENCY DEPARTMENT AT [facility] Provider Note   CSN: [id number] Arrival date & time: 11/15/22  2200     History {Add pertinent medical, surgical, social history, OB history to HPI:1} Chief Complaint  Patient presents with  . Abdominal Pain  . Nausea  . Emesis    Robin Montgomery is a 62 y.o. female.  HPI   Patient with medical history including depression, hypertension, diabetes, presenting with complaints of nausea vomiting diarrhea that started 3 days ago.  Patient states that she has decreased appetite and has been able to tolerate p.o., she denies any bloody emesis or coffee-ground emesis denies any dark tarry stools or bloody stools.  She denies any recent antibiotic use no history of C. difficile, she has no associated fevers chills cough congestion body aches.  She notes that she is has not been taking her diabetes medication for the last week states that she "just did not want to", she also notes that she has been drinking 1/5 of liquor daily for the last month, last drink was yesterday, had not drink anything today, states that she has intermittent issues with alcohol consumption.  Patient states that she is never had be hospitalized for withdrawals nor has she ever been hospitalized elevated blood sugars.  She does note that she has been having increased thirst and urination.  She denies any thoughts to herself or others.    Home Medications Prior to Admission medications   Medication Sig Start Date End Date Taking? Authorizing Provider  Accu-Chek Softclix Lancets lancets Use as instructed to check blood sugar twice daily. E11.65 02/28/22   Claiborne Rigg, NP  acetaminophen (TYLENOL) 500 MG tablet Take 1 tablet (500 mg total) by mouth every 6 (six) hours as needed. 08/19/19   Lorelee New, PA-C  ARIPiprazole (ABILIFY) 10 MG tablet TAKE ONE TABLET BY MOUTH EVERYDAY AT BEDTIME 02/14/22   Toy Cookey E, NP  atorvastatin (LIPITOR) 40 MG tablet  TAKE ONE TABLET BY MOUTH ONCE DAILY FOR CHOLESTEROL 07/14/22   Claiborne Rigg, NP  AUSTEDO 12 MG TABS TAKE THREE TABLETS BY MOUTH ONCE DAILY 02/14/22   Toy Cookey E, NP  Blood Glucose Monitoring Suppl (ACCU-CHEK GUIDE ME) w/Device KIT Use to check blood sugar twice daily. E11.65 02/28/22   Claiborne Rigg, NP  Blood Pressure Monitor DEVI Please provide patient with insurance approved blood pressure monitor. Patient not taking: Reported on 02/28/2022 01/26/21   Claiborne Rigg, NP  empagliflozin (JARDIANCE) 10 MG TABS tablet Take 1 tablet (10 mg total) by mouth daily before breakfast. 02/28/22   Claiborne Rigg, NP  glucose blood (ACCU-CHEK GUIDE) test strip Use to check blood sugar twice daily. E11.65 02/28/22   Claiborne Rigg, NP  insulin detemir (LEVEMIR FLEXTOUCH) 100 UNIT/ML FlexPen Inject 30 Units into the skin 2 (two) times daily. For diabetes 02/28/22   Claiborne Rigg, NP  Insulin Pen Needle (TECHLITE PEN NEEDLES) 31G X 5 MM MISC use as instructed.inject into the skin once nightly 01/28/21   Claiborne Rigg, NP  lisinopril (ZESTRIL) 40 MG tablet Take 1 tablet (40 mg total) by mouth every morning. Please make PCP appointment. 10/18/22   Hoy Register, MD  metFORMIN (GLUCOPHAGE-XR) 500 MG 24 hr tablet TAKE ONE TABLET BY MOUTH ONCE DAILY 07/14/22   Claiborne Rigg, NP  Misc. Devices (OFFSET CANE) MISC Please provide patient with single cane. M70.62 01/26/21   Claiborne Rigg, NP  naproxen (NAPROSYN) 375 MG  tablet Take 1 tablet (375 mg total) by mouth 2 (two) times daily. 02/11/21   Linwood Dibbles, MD  omeprazole (PRILOSEC) 40 MG capsule TAKE ONE CAPSULE BY MOUTH EVERY EVENING FOR acid reflux 07/14/22   Claiborne Rigg, NP  SYMBICORT 160-4.5 MCG/ACT inhaler INHALE TWO PUFFS BY MOUTH INTO LUNGS IN THE MORNING AND AT bedtime Needs appointment for further refills 10/18/22   Hoy Register, MD  traZODone (DESYREL) 100 MG tablet Take 1 tablet (100 mg total) by mouth at bedtime. Please schedule PCP  appointment for more refills. 08/16/22   Hoy Register, MD  traZODone (DESYREL) 50 MG tablet TAKE 1/2 TABLET BY MOUTH EVERYDAY AT BEDTIME FOR SLEEP 07/20/22   Hoy Register, MD  VENTOLIN HFA 108 (90 Base) MCG/ACT inhaler INHALE 1 PUFF BY MOUTH INTO LUNGS EVERY 6 HOURS AS NEEDED FOR WHEEZING AND/OR SHORTNESS OF BREATH Needs appointment for further refills 08/29/22   Claiborne Rigg, NP      Allergies    Aspirin    Review of Systems   Review of Systems  Constitutional:  Negative for chills and fever.  Respiratory:  Negative for shortness of breath.   Cardiovascular:  Negative for chest pain.  Gastrointestinal:  Positive for abdominal pain, nausea and vomiting.  Neurological:  Negative for headaches.    Physical Exam Updated Vital Signs BP (!) 152/91   Pulse (!) 109   Temp 98.9 F (37.2 C) (Oral)   Resp (!) 23   SpO2 96%  Physical Exam Vitals and nursing note reviewed.  Constitutional:      General: She is not in acute distress.    Appearance: She is not ill-appearing.  HENT:     Head: Normocephalic and atraumatic.     Nose: No congestion.  Eyes:     Conjunctiva/sclera: Conjunctivae normal.  Cardiovascular:     Rate and Rhythm: Regular rhythm. Tachycardia present.     Pulses: Normal pulses.     Heart sounds: No murmur heard.    No friction rub. No gallop.  Pulmonary:     Effort: No respiratory distress.     Breath sounds: No wheezing, rhonchi or rales.  Abdominal:     Palpations: Abdomen is soft.     Tenderness: There is abdominal tenderness. There is no right CVA tenderness or left CVA tenderness.     Comments: Abdomen nondistended, soft, notable epigastric tenderness without guarding rebound is or peritoneal sign.  Skin:    General: Skin is warm and dry.  Neurological:     Mental Status: She is alert.  Psychiatric:        Mood and Affect: Mood normal.     ED Results / Procedures / Treatments   Labs (all labs ordered are listed, but only abnormal results are  displayed) Labs Reviewed  COMPREHENSIVE METABOLIC PANEL  ETHANOL  LIPASE, BLOOD  CBC WITH DIFFERENTIAL/PLATELET  LACTIC ACID, PLASMA  LACTIC ACID, PLASMA  URINALYSIS, ROUTINE W REFLEX MICROSCOPIC  BLOOD GAS, VENOUS  RAPID URINE DRUG SCREEN, HOSP PERFORMED    EKG EKG Interpretation  Date/Time:  Tuesday November 15 2022 22:15:02 EDT Ventricular Rate:  110 PR Interval:  117 QRS Duration: 82 QT Interval:  379 QTC Calculation: 513 R Axis:   78 Text Interpretation: Sinus tachycardia Probable left atrial enlargement Prolonged QT interval Confirmed by Palumbo, April ([id number]) on 11/16/2022 12:00:00 AM  Radiology No results found.  Procedures Procedures  {Document cardiac monitor, telemetry assessment procedure when appropriate:1}  Medications Ordered in ED Medications  ondansetron (  ZOFRAN) injection 4 mg (has no administration in time range)  LORazepam (ATIVAN) injection 0-4 mg (has no administration in time range)    Or  LORazepam (ATIVAN) tablet 0-4 mg (has no administration in time range)  LORazepam (ATIVAN) injection 0-4 mg (has no administration in time range)    Or  LORazepam (ATIVAN) tablet 0-4 mg (has no administration in time range)  thiamine (VITAMIN B1) tablet 100 mg (has no administration in time range)    Or  thiamine (VITAMIN B1) injection 100 mg (has no administration in time range)  sodium chloride 0.9 % bolus 1,000 mL (1,000 mLs Intravenous New Bag/Given 11/16/22 0000)    ED Course/ Medical Decision Making/ A&P   {   Click here for ABCD2, HEART and other calculatorsREFRESH Note before signing :1}                          Medical Decision Making Amount and/or Complexity of Data Reviewed Labs: ordered.  Risk OTC drugs. Prescription drug management.   ***  {Document critical care time when appropriate:1} {Document review of labs and clinical decision tools ie heart score, Chads2Vasc2 etc:1}  {Document your independent review of radiology images, and any  outside records:1} {Document your discussion with family members, caretakers, and with consultants:1} {Document social determinants of health affecting pt's care:1} {Document your decision making why or why not admission, treatments were needed:1} Final Clinical Impression(s) / ED Diagnoses Final diagnoses:  None    Rx / DC Orders ED Discharge Orders     None

## 2022-11-17 DIAGNOSIS — N3000 Acute cystitis without hematuria: Secondary | ICD-10-CM | POA: Diagnosis not present

## 2022-11-17 LAB — COMPREHENSIVE METABOLIC PANEL
ALT: 13 U/L (ref 0–44)
AST: 19 U/L (ref 15–41)
Albumin: 2.7 g/dL — ABNORMAL LOW (ref 3.5–5.0)
Alkaline Phosphatase: 75 U/L (ref 38–126)
Anion gap: 10 (ref 5–15)
BUN: <5 mg/dL — ABNORMAL LOW (ref 8–23)
CO2: 23 mmol/L (ref 22–32)
Calcium: 8.4 mg/dL — ABNORMAL LOW (ref 8.9–10.3)
Chloride: 102 mmol/L (ref 98–111)
Creatinine, Ser: 0.54 mg/dL (ref 0.44–1.00)
GFR, Estimated: >60 mL/min (ref >60)
Glucose, Bld: 93 mg/dL (ref 70–99)
Potassium: 3.4 mmol/L — ABNORMAL LOW (ref 3.5–5.1)
Sodium: 135 mmol/L (ref 135–145)
Total Bilirubin: 0.3 mg/dL (ref 0.3–1.2)
Total Protein: 6.3 g/dL — ABNORMAL LOW (ref 6.5–8.1)

## 2022-11-17 LAB — CBC
HCT: 39.3 % (ref 36.0–46.0)
Hemoglobin: 13 g/dL (ref 12.0–15.0)
MCH: 32.2 pg (ref 26.0–34.0)
MCHC: 33.1 g/dL (ref 30.0–36.0)
MCV: 97.3 fL (ref 80.0–100.0)
Platelets: 325 10*3/uL (ref 150–400)
RBC: 4.04 MIL/uL (ref 3.87–5.11)
RDW: 12.6 % (ref 11.5–15.5)
WBC: 9.2 10*3/uL (ref 4.0–10.5)
nRBC: 0 % (ref 0.0–0.2)

## 2022-11-17 LAB — GLUCOSE, CAPILLARY
Glucose-Capillary: 72 mg/dL (ref 70–99)
Glucose-Capillary: 150 mg/dL — ABNORMAL HIGH (ref 70–99)
Glucose-Capillary: 165 mg/dL — ABNORMAL HIGH (ref 70–99)
Glucose-Capillary: 112 mg/dL — ABNORMAL HIGH (ref 70–99)
Glucose-Capillary: 119 mg/dL — ABNORMAL HIGH (ref 70–99)

## 2022-11-17 LAB — C DIFFICILE QUICK SCREEN W PCR REFLEX
C Diff antigen: NEGATIVE
C Diff interpretation: NOT DETECTED
C Diff toxin: NEGATIVE

## 2022-11-17 LAB — URINE CULTURE

## 2022-11-17 MED ORDER — PANTOPRAZOLE SODIUM 40 MG PO TBEC
40.0000 mg | DELAYED_RELEASE_TABLET | Freq: Two times a day (BID) | ORAL | Status: DC
  Administered 2022-11-17 – 2022-11-19 (×5): 40 mg via ORAL
  Filled 2022-11-17 (×5): qty 1

## 2022-11-17 MED ORDER — LOSARTAN POTASSIUM 50 MG PO TABS
25.0000 mg | ORAL_TABLET | Freq: Every day | ORAL | Status: DC
  Administered 2022-11-17 – 2022-11-19 (×3): 25 mg via ORAL
  Filled 2022-11-17 (×3): qty 1

## 2022-11-17 MED ORDER — LORAZEPAM 1 MG PO TABS
1.0000 mg | ORAL_TABLET | Freq: Once | ORAL | Status: AC
  Administered 2022-11-18: 1 mg via ORAL
  Filled 2022-11-17: qty 1

## 2022-11-17 MED ORDER — METOPROLOL TARTRATE 5 MG/5ML IV SOLN: 5.0000 mg | INTRAVENOUS | Status: DC | PRN

## 2022-11-17 MED ORDER — IPRATROPIUM-ALBUTEROL 0.5-2.5 (3) MG/3ML IN SOLN: 3.0000 mL | RESPIRATORY_TRACT | Status: DC | PRN

## 2022-11-17 MED ORDER — TRAZODONE HCL 50 MG PO TABS
50.0000 mg | ORAL_TABLET | Freq: Every evening | ORAL | Status: DC | PRN
  Administered 2022-11-17 – 2022-11-19 (×2): 50 mg via ORAL
  Filled 2022-11-17 (×2): qty 1

## 2022-11-17 MED ORDER — SENNOSIDES-DOCUSATE SODIUM 8.6-50 MG PO TABS: 1.0000 | ORAL_TABLET | Freq: Every evening | ORAL | Status: DC | PRN

## 2022-11-17 MED ORDER — POTASSIUM CHLORIDE CRYS ER 20 MEQ PO TBCR
40.0000 meq | EXTENDED_RELEASE_TABLET | Freq: Once | ORAL | Status: AC
  Administered 2022-11-17: 40 meq via ORAL
  Filled 2022-11-17: qty 2

## 2022-11-17 MED ORDER — GUAIFENESIN 100 MG/5ML PO LIQD: 5.0000 mL | ORAL | Status: DC | PRN

## 2022-11-17 MED ORDER — HYDRALAZINE HCL 20 MG/ML IJ SOLN: 10.0000 mg | INTRAMUSCULAR | Status: DC | PRN

## 2022-11-17 MED ORDER — SODIUM CHLORIDE 0.9 % IV SOLN: INTRAVENOUS | Status: AC

## 2022-11-17 MED ORDER — LIP MEDEX EX OINT
1.0000 | TOPICAL_OINTMENT | CUTANEOUS | Status: DC | PRN
  Administered 2022-11-17: 1 via TOPICAL
  Filled 2022-11-17: qty 7

## 2022-11-17 MED ORDER — OXYCODONE HCL 5 MG PO TABS
5.0000 mg | ORAL_TABLET | ORAL | Status: DC | PRN
  Administered 2022-11-17 – 2022-11-19 (×4): 5 mg via ORAL
  Filled 2022-11-17 (×4): qty 1

## 2022-11-17 NOTE — Progress Notes (Signed)
PROGRESS NOTE    Robin Montgomery  ZOX:[id number] DOB: Jun 07, 1960 DOA: 11/15/2022 PCP: Claiborne Rigg, NP   Brief Narrative:  62 year old with history of depression, DM2, HTN, seasonal allergy, polysubstance abuse comes to the ER with complaints of epigastric abdominal pain with nausea and vomiting ongoing for several days.  Upon admission she was noted to be positive for cocaine and THC, you had urinary tract infection, CT abdomen pelvis overall did not show any acute pathology.  She was diagnosed with acute gastroenteritis and cystitis and admitted to the hospital.  She due to her alcohol abuse she was also placed on alcohol withdrawal protocol.   Assessment & Plan:  Principal Problem:   Acute cystitis Active Problems:   Diabetes mellitus type 2, insulin dependent (HCC)   Cocaine abuse (HCC)   Tobacco dependence   MDD (major depressive disorder), recurrent severe, without psychosis (HCC)   Alcohol abuse   Essential hypertension   Acute gastroenteritis   Hypokalemia   Hypophosphatemia   Moderate protein malnutrition (HCC)    Acute gastroenteritis -Wonder if this is infectious versus gastritis from heavy alcohol use.  Currently patient is on empiric IV Rocephin and Flagyl.  CT abdomen pelvis does not show any acute pathology.  For now we will order GI panel, C. difficile.  Placed on PPI twice daily, IV fluids.     Acute cystitis without hematuria Already on Rocephin.  Urine culture showing gram-negative rods     Alcohol abuse Alcohol withdrawal protocol in place.  Continue folic acid, thiamine and multivitamin.     Hypokalemia/hypophosphatemia Aggressive repletion as necessary     Moderate protein malnutrition (HCC)  Encourage oral intake     Diabetes mellitus type 2, insulin dependent (HCC) Currently on p.o. medications on hold while p.o. intake is limited Lantus 20 units twice daily.  Sliding scale and Accu-Cheks     Cocaine abuse (HCC) Tobacco use Counseled quit  using Nicotine patch as needed     MDD (major depressive disorder), recurrent severe, without psychosis (HCC) Off medications. Psychiatry consultation requested.     Essential hypertension IV as needed.  Will start losartan 25 mg daily   DVT prophylaxis: Lovenox Code Status: Full Family Communication:   Status is: Inpatient Still having abdominal discomfort.       Diet Orders (From admission, onward)     Start     Ordered   11/16/22 0713  Diet clear liquid Room service appropriate? Yes; Fluid consistency: Thin  Diet effective now       Question Answer Comment  Room service appropriate? Yes   Fluid consistency: Thin      11/16/22 0713            Subjective: Tells me she still has some abdominal discomfort and feeling uneasy but improved.  Denies any episodes of diarrhea this morning.   Examination:  General exam: Appears calm and comfortable  Respiratory system: Clear to auscultation. Respiratory effort normal. Cardiovascular system: S1 & S2 heard, RRR. No JVD, murmurs, rubs, gallops or clicks. No pedal edema. Gastrointestinal system: Abdomen is nondistended, soft and nontender. No organomegaly or masses felt. Normal bowel sounds heard. Central nervous system: Alert and oriented. No focal neurological deficits. Extremities: Symmetric 5 x 5 power. Skin: No rashes, lesions or ulcers Psychiatry: Judgement and insight appear normal. Mood & affect appropriate.  Objective: Vitals:   11/16/22 1533 11/16/22 2218 11/17/22 0605 11/17/22 0736  BP: (!) 162/81 (!) 157/84 (!) 148/78 129/63  Pulse: (!) 109  96 96  Resp: 14 16 15 20   Temp: 99.5 F (37.5 C) 98.6 F (37 C) 98 F (36.7 C) 97.8 F (36.6 C)  TempSrc: Oral Oral  Oral  SpO2: 98% 100% 95% 97%  Weight: 64.4 kg     Height: 5\' 6"  (1.676 m)       Intake/Output Summary (Last 24 hours) at 11/17/2022 0759 Last data filed at 11/17/2022 0500 Gross per 24 hour  Intake 1628.16 ml  Output --  Net 1628.16 ml    Filed Weights   11/16/22 1533  Weight: 64.4 kg    Scheduled Meds:  enoxaparin (LOVENOX) injection  40 mg Subcutaneous Q24H   insulin aspart  0-9 Units Subcutaneous TID WC   insulin detemir  20 Units Subcutaneous BID   LORazepam  0-4 mg Intravenous Q6H   Or   LORazepam  0-4 mg Oral Q6H   [START ON 11/18/2022] LORazepam  0-4 mg Intravenous Q12H   Or   [START ON 11/18/2022] LORazepam  0-4 mg Oral Q12H   phosphorus  250 mg Oral TID   potassium chloride  20 mEq Oral Once   thiamine  100 mg Oral Daily   Or   thiamine  100 mg Intravenous Daily   Continuous Infusions:  cefTRIAXone (ROCEPHIN)  IV 2 g (11/17/22 0420)   metronidazole 500 mg (11/17/22 0237)   sodium chloride      Nutritional status     Body mass index is 22.92 kg/m.  Data Reviewed:   CBC: Recent Labs  Lab 11/15/22 2353 11/16/22 0723 11/17/22 0545  WBC 12.7* 11.6* 9.2  NEUTROABS 9.3*  --   --   HGB 14.5 12.9 13.0  HCT 43.8 39.2 39.3  MCV 97.1 97.3 97.3  PLT 331 307 325   Basic Metabolic Panel: Recent Labs  Lab 11/15/22 2353 11/16/22 0723 11/17/22 0545  NA 132* 133* 135  K 3.3* 3.1* 3.4*  CL 96* 102 102  CO2 22 20* 23  GLUCOSE 279* 248* 93  BUN 11 8 <5*  CREATININE 0.61 0.60 0.54  CALCIUM 8.6* 7.7* 8.4*  MG  --  1.9  --   PHOS  --  2.1*  --    GFR: Estimated Creatinine Clearance: 68.3 mL/min (by C-G formula based on SCr of 0.54 mg/dL). Liver Function Tests: Recent Labs  Lab 11/15/22 2353 11/16/22 0723 11/17/22 0545  AST 12* 10* 19  ALT 12 11 13   ALKPHOS 103 98 75  BILITOT 0.8 0.6 0.3  PROT 8.2* 6.9 6.3*  ALBUMIN 3.5 2.9* 2.7*   Recent Labs  Lab 11/15/22 2353  LIPASE 24   No results for input(s): "AMMONIA" in the last 168 hours. Coagulation Profile: No results for input(s): "INR", "PROTIME" in the last 168 hours. Cardiac Enzymes: No results for input(s): "CKTOTAL", "CKMB", "CKMBINDEX", "TROPONINI" in the last 168 hours. BNP (last 3 results) No results for input(s):  "PROBNP" in the last 8760 hours. HbA1C: Recent Labs    11/16/22 0723  HGBA1C 9.1*   CBG: Recent Labs  Lab 11/16/22 0850 11/16/22 1244 11/16/22 1620 11/16/22 2035 11/17/22 0739  GLUCAP 309* 226* 164* 159* 72   Lipid Profile: No results for input(s): "CHOL", "HDL", "LDLCALC", "TRIG", "CHOLHDL", "LDLDIRECT" in the last 72 hours. Thyroid Function Tests: No results for input(s): "TSH", "T4TOTAL", "FREET4", "T3FREE", "THYROIDAB" in the last 72 hours. Anemia Panel: No results for input(s): "VITAMINB12", "FOLATE", "FERRITIN", "TIBC", "IRON", "RETICCTPCT" in the last 72 hours. Sepsis Labs: Recent Labs  Lab 11/15/22 2353 11/16/22 1610  LATICACIDVEN 1.1 0.8    Recent Results (from the past 240 hour(s))  Urine Culture     Status: Abnormal (Preliminary result)   Collection Time: 11/16/22  3:29 AM   Specimen: Urine, Clean Catch  Result Value Ref Range Status   Specimen Description   Final    URINE, CLEAN CATCH Performed at [facility], [address], [place], [place] [address]    Special Requests   Final    NONE Performed at [facility], [address] [place], [place] [address]    Culture >=100,000 COLONIES/mL GRAM NEGATIVE RODS (A)  Final   Report Status PENDING  Incomplete         Radiology Studies: CT ABDOMEN PELVIS W CONTRAST  Result Date: 11/16/2022 CLINICAL DATA:  Epigastric pain EXAM: CT ABDOMEN AND PELVIS WITH CONTRAST TECHNIQUE: Multidetector CT imaging of the abdomen and pelvis was performed using the standard protocol following bolus administration of intravenous contrast. RADIATION DOSE REDUCTION: This exam was performed according to the departmental dose-optimization program which includes automated exposure control, adjustment of the mA and/or kV according to patient size and/or use of iterative reconstruction technique. CONTRAST:  OMNIPAQUE IOHEXOL 300 MG/ML  SOLN COMPARISON:  02/11/2021 FINDINGS: Lower chest:  Dependent atelectasis. No acute abnormality. Small hiatal hernia. Hepatobiliary: No focal liver abnormality is seen. Status post cholecystectomy. No biliary dilatation. Pancreas: No focal abnormality or ductal dilatation. Spleen: No focal abnormality.  Normal size. Adrenals/Urinary Tract: No adrenal abnormality. No focal renal abnormality. No stones or hydronephrosis. Urinary bladder is unremarkable. Stomach/Bowel: Left colonic diverticulosis. No active diverticulitis. Stomach and small bowel decompressed, unremarkable. Vascular/Lymphatic: Aortic atherosclerosis. No evidence of aneurysm or adenopathy. Reproductive: Uterus and adnexa unremarkable.  No mass. Other: No free fluid or free air. Musculoskeletal: No acute bony abnormality. IMPRESSION: No acute findings in the abdomen or pelvis. Left colonic diverticulosis.  No active diverticulitis. Aortic atherosclerosis. Electronically Signed   By: Charlett Nose M.D.   On: 11/16/2022 02:44           LOS: 1 day   Time spent= 35 mins    Arleth Mccullar Joline Maxcy, MD [facility]  If 7PM-7AM, please contact night-coverage  11/17/2022, 7:59 AM

## 2022-11-17 NOTE — TOC Initial Note (Signed)
Transition of Care [facility]) - Initial/Assessment Note    Patient Details  Name: Robin Montgomery MRN: [id number] Date of Birth: [birth year]  Transition of Care [facility]) CM/SW Contact:    Otelia Santee, LCSW Phone Number: 11/17/2022, 12:42 PM  Clinical Narrative:                 Met with pt to discuss SDOH, SA resources, and PCP. Pt shares she currently lives at  home alone. She has PCP with [facility] but, is unsure if she would like to continue services at this location. PCP information placed on follow up for pt to call to schedule appointment at location she wants. Pt  reports struggling to get to appointments as she does not have a car. Pt reports she has never used Medicaid transportation before and is agreeable to using this services for future appointments. Pt shares she struggles to obtain food at times and is agreeable to referral being made through [facility]. Pt agreeable to outpatient services for substance use and shares she currently does not see a counselor for this.  Resources for transportation, food, substance use, and PCP have been placed on pt's discharge instructions.  Pt shares she had a RW that is broken and needs a new RW. She believes her last RW was obtained more than 5 years ago. Will order RW for pt prior to discharge.   Expected Discharge Plan: Home/Self Care Barriers to Discharge: Continued Medical Work up   Patient Goals and CMS Choice Patient states their goals for this hospitalization and ongoing recovery are:: To go home CMS Medicare.gov Compare Post Acute Care list provided to:: Patient Choice offered to / list presented to : Patient [place] ownership interest in [facility].provided to:: Patient    Expected Discharge Plan and Services In-house Referral: Clinical Social Work Discharge Planning Services: NA Post Acute Care Choice: Durable Medical Equipment Living arrangements for the past 2 months: Apartment                                       Prior Living Arrangements/Services Living arrangements for the past 2 months: Apartment Lives with:: Self Patient language and need for interpreter reviewed:: Yes Do you feel safe going back to the place where you live?: Yes      Need for Family Participation in Patient Care: No (Comment) Care giver support system in place?: No (comment)   Criminal Activity/Legal Involvement Pertinent to Current Situation/Hospitalization: No - Comment as needed  Activities of Daily Living      Permission Sought/Granted   Permission granted to share information with : No              Emotional Assessment Appearance:: Appears stated age Attitude/Demeanor/Rapport: Engaged Affect (typically observed): Accepting, Tearful/Crying Orientation: : Oriented to Self, Oriented to Place, Oriented to  Time, Oriented to Situation Alcohol / Substance Use: Alcohol Use Psych Involvement: Yes (comment)  Admission diagnosis:  Acute cystitis [N30.00] Polysubstance abuse (HCC) [F19.10] Acute cystitis without hematuria [N30.00] Alcohol withdrawal syndrome without complication (HCC) [F10.930] Patient Active Problem List   Diagnosis Date Noted   Acute cystitis 11/16/2022   Acute gastroenteritis 11/16/2022   Hypokalemia 11/16/2022   Hypophosphatemia 11/16/2022   Moderate protein malnutrition (HCC) 11/16/2022   Left medial knee pain 07/31/2016   Essential hypertension 11/17/2015   Alcohol abuse    MDD (major depressive disorder), recurrent  severe, without psychosis (HCC) 02/12/2015   Substance induced mood disorder (HCC) 02/12/2015   Polysubstance abuse (HCC)    Wrist pain, chronic 12/22/2014   Trochanteric bursitis of left hip 12/22/2014   Seasonal allergies 09/12/2014   Tobacco dependence 09/12/2014   Shortness of breath 09/12/2014   Diabetes mellitus type 2, insulin dependent (HCC) 04/01/2014   Pain due to onychomycosis of toenail 04/01/2014   Cocaine abuse (HCC)  04/01/2014   PCP:  Claiborne Rigg, NP Pharmacy:   [facility] - [place], [place] - [address] [place] [address] Phone: [phone] Fax: [phone]     Social Determinants of Health (SDOH) Social History: SDOH Screenings   Housing: Medium Risk (01/19/2021)  Depression (PHQ2-9): Medium Risk (02/28/2022)  Financial Resource Strain: Medium Risk (01/19/2021)  Stress: Stress Concern Present (01/19/2021)  Tobacco Use: High Risk (11/16/2022)   SDOH Interventions:     Readmission Risk Interventions     No data to display

## 2022-11-18 ENCOUNTER — Inpatient Hospital Stay (HOSPITAL_COMMUNITY): Payer: Medicaid Other

## 2022-11-18 DIAGNOSIS — N3000 Acute cystitis without hematuria: Secondary | ICD-10-CM | POA: Diagnosis not present

## 2022-11-18 LAB — CBC
HCT: 36.7 % (ref 36.0–46.0)
Hemoglobin: 12.2 g/dL (ref 12.0–15.0)
MCH: 32.3 pg (ref 26.0–34.0)
MCHC: 33.2 g/dL (ref 30.0–36.0)
MCV: 97.1 fL (ref 80.0–100.0)
Platelets: 371 10*3/uL (ref 150–400)
RBC: 3.78 MIL/uL — ABNORMAL LOW (ref 3.87–5.11)
RDW: 12.5 % (ref 11.5–15.5)
WBC: 7.6 10*3/uL (ref 4.0–10.5)
nRBC: 0 % (ref 0.0–0.2)

## 2022-11-18 LAB — MAGNESIUM: Magnesium: 1.8 mg/dL (ref 1.7–2.4)

## 2022-11-18 LAB — GASTROINTESTINAL PANEL BY PCR, STOOL (REPLACES STOOL CULTURE)

## 2022-11-18 LAB — BASIC METABOLIC PANEL
Anion gap: 8 (ref 5–15)
BUN: <5 mg/dL — ABNORMAL LOW (ref 8–23)
CO2: 23 mmol/L (ref 22–32)
Calcium: 8.3 mg/dL — ABNORMAL LOW (ref 8.9–10.3)
Chloride: 107 mmol/L (ref 98–111)
Creatinine, Ser: 0.46 mg/dL (ref 0.44–1.00)
GFR, Estimated: >60 mL/min (ref >60)
Glucose, Bld: 60 mg/dL — ABNORMAL LOW (ref 70–99)
Potassium: 3.2 mmol/L — ABNORMAL LOW (ref 3.5–5.1)
Sodium: 138 mmol/L (ref 135–145)

## 2022-11-18 LAB — URINE CULTURE: Culture: >=100000 — AB

## 2022-11-18 LAB — PHOSPHORUS: Phosphorus: 2.8 mg/dL (ref 2.5–4.6)

## 2022-11-18 LAB — GLUCOSE, CAPILLARY
Glucose-Capillary: 117 mg/dL — ABNORMAL HIGH (ref 70–99)
Glucose-Capillary: 132 mg/dL — ABNORMAL HIGH (ref 70–99)
Glucose-Capillary: 135 mg/dL — ABNORMAL HIGH (ref 70–99)
Glucose-Capillary: 137 mg/dL — ABNORMAL HIGH (ref 70–99)
Glucose-Capillary: 70 mg/dL (ref 70–99)

## 2022-11-18 MED ORDER — MAGNESIUM SULFATE 2 GM/50ML IV SOLN
2.0000 g | Freq: Once | INTRAVENOUS | Status: AC
  Administered 2022-11-18: 2 g via INTRAVENOUS
  Filled 2022-11-18: qty 50

## 2022-11-18 MED ORDER — POTASSIUM CHLORIDE 10 MEQ/100ML IV SOLN
10.0000 meq | INTRAVENOUS | Status: AC
  Administered 2022-11-18 (×4): 10 meq via INTRAVENOUS
  Filled 2022-11-18 (×4): qty 100

## 2022-11-18 NOTE — Progress Notes (Signed)
Patient this afternoon mentions she has been having some LLE numbness/limp along with her 3 toes. Patient reports she is worried about a stroke. I suspect some type of radiculopathy, but first will get MRI Brain to rule out anything intracranial.  At this point, we can hold off on CT head as this has been on going for a week.   Stephania Fragmin MD

## 2022-11-18 NOTE — TOC Progression Note (Addendum)
Transition of Care [facility]) - Progression Note    Patient Details  Name: Robin Montgomery MRN: [id number] Date of Birth: [birth year]  Transition [facility] Care [facility]) CM/SW Contact  Otelia Santee, LCSW Phone Number: 11/18/2022, 1:15 PM  Clinical Narrative:    RW ordered through [employer] to be delivered to pt's room prior to discharge.   Addendum 2:00pm: Per [employer] pt's insurance will not approve [place] due to no medical dx or medical condition requiring pt to need RW.    Expected Discharge Plan: Home/Self Care Barriers to Discharge: Continued Medical Work up  Expected Discharge Plan and Services In-house Referral: Clinical Social Work Discharge Planning Services: NA Post Acute Care Choice: Durable Medical Equipment Living arrangements for the past 2 months: Apartment                 DME Arranged: [place] DME Agency: [employer] Date DME Agency Contacted: 11/18/22 Time DME Agency Contacted: 1315 Representative spoke with at DME Agency: Beckie Salts             Social Determinants of Health (SDOH) Interventions SDOH Screenings   Housing: Medium Risk (01/19/2021)  Depression (PHQ2-9): Medium Risk (02/28/2022)  Financial Resource Strain: Medium Risk (01/19/2021)  Stress: Stress Concern Present (01/19/2021)  Tobacco Use: High Risk (11/16/2022)    Readmission Risk Interventions     No data to display

## 2022-11-18 NOTE — Progress Notes (Signed)
PROGRESS NOTE    Robin Montgomery  WUJ:[id number] DOB: [birth year] DOA: 11/15/2022 PCP: Claiborne Rigg, NP   Brief Narrative:  62 year old with history of depression, DM2, HTN, seasonal allergy, polysubstance abuse comes to the ER with complaints of epigastric abdominal pain with nausea and vomiting ongoing for several days.  Upon admission she was noted to be positive for cocaine and THC, you had urinary tract infection, CT abdomen pelvis overall did not show any acute pathology.  She was diagnosed with acute gastroenteritis and cystitis and admitted to the hospital.  She due to her alcohol abuse she was also placed on alcohol withdrawal protocol.  Eventually her urine grew E. coli which was pansensitive therefore on IV Rocephin and stool studies were negative for C. difficile but showed enteropathogenic E. coli.   Assessment & Plan:  Principal Problem:   Acute cystitis Active Problems:   Diabetes mellitus type 2, insulin dependent (HCC)   Cocaine abuse (HCC)   Tobacco dependence   MDD (major depressive disorder), recurrent severe, without psychosis (HCC)   Alcohol abuse   Essential hypertension   Acute gastroenteritis   Hypokalemia   Hypophosphatemia   Moderate protein malnutrition (HCC)    Acute gastroenteritis - Her stool cultures are growing enteropathogenic E. coli.  At this time tells me her diarrhea and nausea has significantly improved.  No need for antibiotics for this issue but she is on Rocephin for urinary tract infection.  Will plan for supportive care, will slowly advance her diet today as tolerated.      Acute cystitis without hematuria Urine cultures are growing E. coli which is pansensitive.  Continue IV Rocephin     Alcohol abuse Alcohol withdrawal protocol in place.  Continue folic acid, thiamine and multivitamin.     Hypokalemia/hypophosphatemia Aggressive repletion as necessary     Moderate protein malnutrition (HCC)  Encourage oral intake     Diabetes  mellitus type 2, insulin dependent (HCC) Currently on p.o. medications on hold while p.o. intake is limited Lantus 20 units twice daily.  Sliding scale and Accu-Cheks     Cocaine abuse (HCC) Tobacco use Counseled quit using Nicotine patch as needed     MDD (major depressive disorder), recurrent severe, without psychosis (HCC) Off medications. Psychiatry consultation requested.     Essential hypertension IV as needed.  Will start losartan 25 mg daily   DVT prophylaxis: Lovenox Code Status: Full Family Communication:   Status is: Inpatient If she is able to tolerate p.o. over next 24 hours with adequate hydration she might be able to go home     Diet Orders (From admission, onward)     Start     Ordered   11/18/22 0733  Diet full liquid Room service appropriate? Yes; Fluid consistency: Thin  Diet effective now       Question Answer Comment  Room service appropriate? Yes   Fluid consistency: Thin      11/18/22 0732            Subjective: Tells me her nausea is improved, no further diarrhea this morning.  Generally weak but feeling better  Examination: Constitutional: Not in acute distress Respiratory: Clear to auscultation bilaterally Cardiovascular: Normal sinus rhythm, no rubs Abdomen: Nontender nondistended good bowel sounds Musculoskeletal: No edema noted Skin: No rashes seen Neurologic: CN 2-12 grossly intact.  And nonfocal Psychiatric: Normal judgment and insight. Alert and oriented x 3. Normal mood.  Objective: Vitals:   11/17/22 0736 11/17/22 1551 11/17/22 1934 11/18/22 9562  BP: 129/63 139/79 132/66 (!) 134/55  Pulse: 96 92 92 100  Resp: 20 18 15 18   Temp: 97.8 F (36.6 C) 97.8 F (36.6 C) 98.3 F (36.8 C) 97.8 F (36.6 C)  TempSrc: Oral Oral Oral Oral  SpO2: 97% 100% 97% 96%  Weight:      Height:        Intake/Output Summary (Last 24 hours) at 11/18/2022 0800 Last data filed at 11/18/2022 0400 Gross per 24 hour  Intake 2128.03 ml  Output  100 ml  Net 2028.03 ml   Filed Weights   11/16/22 1533  Weight: 64.4 kg    Scheduled Meds:  enoxaparin (LOVENOX) injection  40 mg Subcutaneous Q24H   insulin aspart  0-9 Units Subcutaneous TID WC   insulin detemir  20 Units Subcutaneous BID   LORazepam  0-4 mg Intravenous Q12H   Or   LORazepam  0-4 mg Oral Q12H   losartan  25 mg Oral Daily   pantoprazole  40 mg Oral BID AC   potassium chloride  20 mEq Oral Once   thiamine  100 mg Oral Daily   Or   thiamine  100 mg Intravenous Daily   Continuous Infusions:  sodium chloride Stopped (11/18/22 0352)   cefTRIAXone (ROCEPHIN)  IV 200 mL/hr at 11/18/22 0400   magnesium sulfate bolus IVPB     metronidazole Stopped (11/18/22 0245)   potassium chloride     sodium chloride      Nutritional status     Body mass index is 22.92 kg/m.  Data Reviewed:   CBC: Recent Labs  Lab 11/15/22 2353 11/16/22 0723 11/17/22 0545 11/18/22 0526  WBC 12.7* 11.6* 9.2 7.6  NEUTROABS 9.3*  --   --   --   HGB 14.5 12.9 13.0 12.2  HCT 43.8 39.2 39.3 36.7  MCV 97.1 97.3 97.3 97.1  PLT 331 307 325 371   Basic Metabolic Panel: Recent Labs  Lab 11/15/22 2353 11/16/22 0723 11/17/22 0545 11/18/22 0526  NA 132* 133* 135 138  K 3.3* 3.1* 3.4* 3.2*  CL 96* 102 102 107  CO2 22 20* 23 23  GLUCOSE 279* 248* 93 60*  BUN 11 8 <5* <5*  CREATININE 0.61 0.60 0.54 0.46  CALCIUM 8.6* 7.7* 8.4* 8.3*  MG  --  1.9  --  1.8  PHOS  --  2.1*  --  2.8   GFR: Estimated Creatinine Clearance: 68.3 mL/min (by C-G formula based on SCr of 0.46 mg/dL). Liver Function Tests: Recent Labs  Lab 11/15/22 2353 11/16/22 0723 11/17/22 0545  AST 12* 10* 19  ALT 12 11 13   ALKPHOS 103 98 75  BILITOT 0.8 0.6 0.3  PROT 8.2* 6.9 6.3*  ALBUMIN 3.5 2.9* 2.7*   Recent Labs  Lab 11/15/22 2353  LIPASE 24   No results for input(s): "AMMONIA" in the last 168 hours. Coagulation Profile: No results for input(s): "INR", "PROTIME" in the last 168 hours. Cardiac  Enzymes: No results for input(s): "CKTOTAL", "CKMB", "CKMBINDEX", "TROPONINI" in the last 168 hours. BNP (last 3 results) No results for input(s): "PROBNP" in the last 8760 hours. HbA1C: Recent Labs    11/16/22 0723  HGBA1C 9.1*   CBG: Recent Labs  Lab 11/17/22 1019 11/17/22 1133 11/17/22 1656 11/17/22 2033 11/18/22 0728  GLUCAP 150* 165* 112* 119* 70   Lipid Profile: No results for input(s): "CHOL", "HDL", "LDLCALC", "TRIG", "CHOLHDL", "LDLDIRECT" in the last 72 hours. Thyroid Function Tests: No results for input(s): "TSH", "T4TOTAL", "FREET4", "  T3FREE", "THYROIDAB" in the last 72 hours. Anemia Panel: No results for input(s): "VITAMINB12", "FOLATE", "FERRITIN", "TIBC", "IRON", "RETICCTPCT" in the last 72 hours. Sepsis Labs: Recent Labs  Lab 11/15/22 2353 11/16/22 0723  LATICACIDVEN 1.1 0.8    Recent Results (from the past 240 hour(s))  Urine Culture     Status: Abnormal (Preliminary result)   Collection Time: 11/16/22  3:29 AM   Specimen: Urine, Clean Catch  Result Value Ref Range Status   Specimen Description   Final    URINE, CLEAN CATCH Performed at [facility], [address] [place], [place] [address]    Special Requests   Final    NONE Performed at [facility], [address], [place], [place] [address]    Culture (A)  Final    >=100,000 COLONIES/mL ESCHERICHIA COLI SUSCEPTIBILITIES TO FOLLOW Performed at [facility], [address], [place], [place] [address]    Report Status PENDING  Incomplete  C Difficile Quick Screen w PCR reflex     Status: None   Collection Time: 11/17/22  6:56 PM   Specimen: STOOL  Result Value Ref Range Status   C Diff antigen NEGATIVE NEGATIVE Final   C Diff toxin NEGATIVE NEGATIVE Final   C Diff interpretation No C. difficile detected.  Final    Comment: Performed at [facility], [address] [place], [place] [address]         Radiology  Studies: No results found.         LOS: 2 days   Time spent= 35 mins    Jameel Quant Joline Maxcy, MD [facility]  If 7PM-7AM, please contact night-coverage  11/18/2022, 8:00 AM

## 2022-11-18 NOTE — Progress Notes (Signed)
Mobility Specialist - Progress Note   11/18/22 1057  Mobility  Activity Ambulated with assistance in hallway  Level of Assistance Standby assist, set-up cues, supervision of patient - no hands on  Assistive Device Other (Comment) (IV Pole)  Distance Ambulated (ft) 500 ft  Activity Response Tolerated well  Mobility Referral Yes  $Mobility charge 1 Mobility  Mobility Specialist Start Time (ACUTE ONLY) 1042  Mobility Specialist Stop Time (ACUTE ONLY) 1056  Mobility Specialist Time Calculation (min) (ACUTE ONLY) 14 min   Pt received in bed and agreeable to mobility. Prior to ambulating pt requested assistance to [facility]. No complaints during session. Pt to bed after session with all needs met.    [facility]

## 2022-11-19 ENCOUNTER — Inpatient Hospital Stay (HOSPITAL_COMMUNITY): Payer: Medicaid Other

## 2022-11-19 DIAGNOSIS — K529 Noninfective gastroenteritis and colitis, unspecified: Secondary | ICD-10-CM | POA: Diagnosis not present

## 2022-11-19 DIAGNOSIS — N3000 Acute cystitis without hematuria: Secondary | ICD-10-CM | POA: Diagnosis not present

## 2022-11-19 DIAGNOSIS — F1010 Alcohol abuse, uncomplicated: Secondary | ICD-10-CM

## 2022-11-19 DIAGNOSIS — E876 Hypokalemia: Secondary | ICD-10-CM

## 2022-11-19 DIAGNOSIS — Z794 Long term (current) use of insulin: Secondary | ICD-10-CM

## 2022-11-19 DIAGNOSIS — E119 Type 2 diabetes mellitus without complications: Secondary | ICD-10-CM

## 2022-11-19 LAB — CBC
HCT: 37.8 % (ref 36.0–46.0)
Hemoglobin: 12.4 g/dL (ref 12.0–15.0)
MCH: 32.2 pg (ref 26.0–34.0)
MCHC: 32.8 g/dL (ref 30.0–36.0)
MCV: 98.2 fL (ref 80.0–100.0)
Platelets: 370 10*3/uL (ref 150–400)
RBC: 3.85 MIL/uL — ABNORMAL LOW (ref 3.87–5.11)
RDW: 12.7 % (ref 11.5–15.5)
WBC: 8 10*3/uL (ref 4.0–10.5)
nRBC: 0 % (ref 0.0–0.2)

## 2022-11-19 LAB — BASIC METABOLIC PANEL
Anion gap: 8 (ref 5–15)
BUN: 8 mg/dL (ref 8–23)
CO2: 26 mmol/L (ref 22–32)
Calcium: 8.7 mg/dL — ABNORMAL LOW (ref 8.9–10.3)
Chloride: 105 mmol/L (ref 98–111)
Creatinine, Ser: 0.55 mg/dL (ref 0.44–1.00)
GFR, Estimated: >60 mL/min (ref >60)
Glucose, Bld: 89 mg/dL (ref 70–99)
Potassium: 3.9 mmol/L (ref 3.5–5.1)
Sodium: 139 mmol/L (ref 135–145)

## 2022-11-19 LAB — GLUCOSE, CAPILLARY
Glucose-Capillary: 75 mg/dL (ref 70–99)
Glucose-Capillary: 113 mg/dL — ABNORMAL HIGH (ref 70–99)

## 2022-11-19 LAB — MAGNESIUM: Magnesium: 1.9 mg/dL (ref 1.7–2.4)

## 2022-11-19 MED ORDER — BUDESONIDE-FORMOTEROL FUMARATE 160-4.5 MCG/ACT IN AERO
2.0000 | INHALATION_SPRAY | Freq: Two times a day (BID) | RESPIRATORY_TRACT | 0 refills | Status: DC
Start: 2022-11-19 — End: 2023-10-31

## 2022-11-19 MED ORDER — LOSARTAN POTASSIUM 25 MG PO TABS: 25.0000 mg | ORAL_TABLET | Freq: Every day | ORAL | 2 refills | Status: DC

## 2022-11-19 MED ORDER — METFORMIN HCL ER 500 MG PO TB24
500.0000 mg | ORAL_TABLET | Freq: Every day | ORAL | 0 refills | Status: DC
Start: 2022-11-19 — End: 2023-11-07

## 2022-11-19 MED ORDER — CEPHALEXIN 500 MG PO CAPS: 500.0000 mg | ORAL_CAPSULE | Freq: Two times a day (BID) | ORAL | 0 refills | Status: AC

## 2022-11-19 MED ORDER — VITAMIN B-1 100 MG PO TABS: 100.0000 mg | ORAL_TABLET | Freq: Every day | ORAL | 1 refills | Status: DC

## 2022-11-19 MED ORDER — SODIUM CHLORIDE 0.9 % IV SOLN: 2.0000 g | INTRAVENOUS | Status: DC

## 2022-11-19 MED ORDER — EMPAGLIFLOZIN 10 MG PO TABS: 10.0000 mg | ORAL_TABLET | Freq: Every day | ORAL | 1 refills | Status: DC

## 2022-11-19 MED ORDER — OMEPRAZOLE 40 MG PO CPDR
40.0000 mg | DELAYED_RELEASE_CAPSULE | Freq: Every day | ORAL | 0 refills | Status: DC
Start: 2022-11-19 — End: 2023-11-07

## 2022-11-19 MED ORDER — ATORVASTATIN CALCIUM 40 MG PO TABS
ORAL_TABLET | ORAL | 0 refills | Status: DC
Start: 2022-11-19 — End: 2023-10-31

## 2022-11-19 MED ORDER — NICOTINE 21 MG/24HR TD PT24: 21.0000 mg | MEDICATED_PATCH | TRANSDERMAL | 0 refills | Status: AC

## 2022-11-19 MED ORDER — ALBUTEROL SULFATE HFA 108 (90 BASE) MCG/ACT IN AERS
1.0000 | INHALATION_SPRAY | Freq: Four times a day (QID) | RESPIRATORY_TRACT | 0 refills | Status: AC | PRN
Start: 2022-11-19 — End: ?

## 2022-11-19 NOTE — Discharge Summary (Addendum)
Physician Discharge Summary   Patient: Robin Montgomery MRN: [id number] DOB: Aug 10, 1960  Admit date:     11/15/2022  Discharge date: 11/19/22  Discharge Physician: Meredeth Ide   PCP: Claiborne Rigg, NP   Recommendations at discharge:   Follow-up PCP as outpatient Ambulatory referral to podiatry placed for left foot pain/cuboid syndrome  Discharge Diagnoses: Principal Problem:   Acute cystitis Active Problems:   Diabetes mellitus type 2, insulin dependent (HCC)   Cocaine abuse (HCC)   Tobacco dependence   MDD (major depressive disorder), recurrent severe, without psychosis (HCC)   Alcohol abuse   Essential hypertension   Acute gastroenteritis   Hypokalemia   Hypophosphatemia   Moderate protein malnutrition (HCC)  Resolved Problems:   * No resolved hospital problems. *  Hospital Course: 62 year old with history of depression, DM2, HTN, seasonal allergy, polysubstance abuse comes to the ER with complaints of epigastric abdominal pain with nausea and vomiting ongoing for several days.  Upon admission she was noted to be positive for cocaine and THC, you had urinary tract infection, CT abdomen pelvis overall did not show any acute pathology.  She was diagnosed with acute gastroenteritis and cystitis and admitted to the hospital.  She due to her alcohol abuse she was also placed on alcohol withdrawal protocol.  Eventually her urine grew E. coli which was pansensitive therefore on IV Rocephin and stool studies were negative for C. difficile but showed enteropathogenic E. coli.  MRI brain was unremarkable  Assessment and Plan:  Acute gastroenteritis -GI pathogen panel showed enteropathogenic E. Coli -Diarrhea has resolved   Acute cystitis without hematuria Urine cultures are growing E. coli which is pansensitive.  -She was started on IV Rocephin -Will discontinue on Keflex for 2 more days      Alcohol abuse -No signs and symptoms of alcohol  Continue thiamine daily    Hypokalemia/hypophosphatemia -Replete     Moderate protein malnutrition (HCC)  Encourage oral intake     Diabetes mellitus type 2, insulin dependent (HCC) -Continue home medications      Cocaine abuse (HCC) Tobacco use Counseled quit using Nicotine patch as needed     MDD (major depressive disorder) -Continue home medications    Essential hypertension losartan 25 mg daily  Left foot pain -Patient complained of pain in the left 3 toes -Likely from cuboid syndrome -X-ray of the left hip showed mild degenerative changes -Ambulatory referral to podiatry has been placed      Consultants:  Procedures performed:  Disposition: Home Diet recommendation:  Regular diet DISCHARGE MEDICATION: Allergies as of 11/19/2022       Reactions   Aspirin Nausea And Vomiting   Upset stomach        Medication List     STOP taking these medications    lisinopril 40 MG tablet Commonly known as: ZESTRIL   naproxen 375 MG tablet Commonly known as: NAPROSYN       TAKE these medications    Accu-Chek Guide Me w/Device Kit Use to check blood sugar twice daily. E11.65   Accu-Chek Guide test strip Generic drug: glucose blood Use to check blood sugar twice daily. E11.65   Accu-Chek Softclix Lancets lancets Use as instructed to check blood sugar twice daily. E11.65   acetaminophen 500 MG tablet Commonly known as: TYLENOL Take 1 tablet (500 mg total) by mouth every 6 (six) hours as needed. What changed: reasons to take this   albuterol 108 (90 Base) MCG/ACT inhaler Commonly known as: Ventolin HFA  Inhale 1-2 puffs into the lungs every 6 (six) hours as needed for wheezing or shortness of breath. What changed: See the new instructions.   ARIPiprazole 10 MG tablet Commonly known as: ABILIFY TAKE ONE TABLET BY MOUTH EVERYDAY AT BEDTIME   atorvastatin 40 MG tablet Commonly known as: LIPITOR TAKE ONE TABLET BY MOUTH ONCE DAILY FOR CHOLESTEROL   Austedo 12 MG  Tabs Generic drug: Deutetrabenazine TAKE THREE TABLETS BY MOUTH ONCE DAILY   Blood Pressure Monitor Devi Please provide patient with insurance approved blood pressure monitor.   budesonide-formoterol 160-4.5 MCG/ACT inhaler Commonly known as: Symbicort Inhale 2 puffs into the lungs 2 (two) times daily. What changed: See the new instructions.   cephALEXin 500 MG capsule Commonly known as: KEFLEX Take 1 capsule (500 mg total) by mouth 2 (two) times daily for 2 days.   empagliflozin 10 MG Tabs tablet Commonly known as: Jardiance Take 1 tablet (10 mg total) by mouth daily before breakfast.   Levemir FlexTouch 100 UNIT/ML FlexPen Generic drug: insulin detemir Inject 30 Units into the skin 2 (two) times daily. For diabetes   losartan 25 MG tablet Commonly known as: COZAAR Take 1 tablet (25 mg total) by mouth daily. Start taking on: November 20, 2022   metFORMIN 500 MG 24 hr tablet Commonly known as: GLUCOPHAGE-XR Take 1 tablet (500 mg total) by mouth daily.   nicotine 21 mg/24hr patch Commonly known as: NICODERM CQ - dosed in mg/24 hours Place 1 patch (21 mg total) onto the skin daily.   Offset Cane Misc Please provide patient with single cane. M70.62   omeprazole 40 MG capsule Commonly known as: PRILOSEC Take 1 capsule (40 mg total) by mouth daily. What changed: See the new instructions.   TechLite Pen Needles 31G X 5 MM Misc Generic drug: Insulin Pen Needle use as instructed.inject into the skin once nightly   thiamine 100 MG tablet Commonly known as: Vitamin B-1 Take 1 tablet (100 mg total) by mouth daily. Start taking on: November 20, 2022   traZODone 50 MG tablet Commonly known as: DESYREL TAKE 1/2 TABLET BY MOUTH EVERYDAY AT BEDTIME FOR SLEEP What changed: Another medication with the same name was removed. Continue taking this medication, and follow the directions you see here.               Durable Medical Equipment  (From admission, onward)            Start     Ordered   11/19/22 0840  For home use only DME Tinch rolling  Once       Question Answer Comment  [place]: With 5 Inch Wheels   Patient needs a [place] to treat with the following condition Cuboid syndrome of left foot      11/19/22 0839   11/18/22 1244  For home use only DME [place] rolling  Once       Question Answer Comment  [place]: With 5 Inch Wheels   Patient needs a [place] to treat with the following condition Ambulatory dysfunction      11/18/22 1244            Follow-up Information     [facility]. Call.   Specialty: Urgent Care Why: Medication management and therapy. You are welcome to return and resume care with Gretchen Short. Contact information: [address] [place] [address] [phone]        [facility] Follow up.   Why: Inpatient residential  rehab Contact information: [address] [place] [address] [phone]         [facility]. Schedule an appointment as soon as possible for a visit.   Why: Call to schedule follow up appointment with your established provider. Contact information: [address] [place] [address] [phone]        [facility]. Schedule an appointment as soon as possible for a visit.   Why: You can call to schedule an appointment at this location if you decide to change primary care providers. Contact information: [address] [place] [address] [phone]         [facility]. Schedule an appointment as soon as possible for a visit.   Why: You can also make appointment at this location if you would like to change primary care locations. Contact information: [address] [place] [address]               Discharge Exam: Ceasar Mons Weights   11/16/22 1533  Weight: 64.4 kg   General-appears in no  acute distress Heart-S1-S2, regular, no murmur auscultated Lungs-clear to auscultation bilaterally, no wheezing or crackles auscultated Abdomen-soft, nontender, no organomegaly Extremities-no edema in the lower extremities Neuro-alert, oriented x3, no focal deficit noted  Condition at discharge: good  The results of significant diagnostics from this hospitalization (including imaging, microbiology, ancillary and laboratory) are listed below for reference.   Imaging Studies: DG HIP UNILAT WITH PELVIS 2-3 VIEWS LEFT  Result Date: 11/19/2022 CLINICAL DATA:  [id number] Pain [id number] EXAM: DG HIP (WITH OR WITHOUT PELVIS) 2-3V LEFT COMPARISON:  February 11, 2021 FINDINGS: Osteopenia. No acute fracture or dislocation. Minimal degenerative changes of the LEFT hip with minimal joint space narrowing and minimal osteophyte formation. Lower lumbar facet arthropathy. No area of erosion or osseous destruction. No unexpected radiopaque foreign body. Vascular calcifications. IMPRESSION: Minimal degenerative changes of the LEFT hip. If there is a persistent clinical concern for nondisplaced hip or pelvic fracture, recommend dedicated pelvic CT or MRI. Electronically Signed   By: Meda Klinefelter M.D.   On: 11/19/2022 13:07   MR BRAIN WO CONTRAST  Result Date: 11/18/2022 CLINICAL DATA:  TIA.  Left-sided weakness for 1 week EXAM: MRI HEAD WITHOUT CONTRAST TECHNIQUE: Multiplanar, multiecho pulse sequences of the brain and surrounding structures were obtained without intravenous contrast. COMPARISON:  Head CT 04/23/2020 FINDINGS: Brain: No acute infarction, hemorrhage, hydrocephalus, extra-axial collection or mass lesion. Few remote white matter insults, likely chronic small vessel ischemia given age and medical history. Brain volume is normal. Vascular: Normal flow voids. Skull and upper cervical spine: Normal marrow signal. Sinuses/Orbits: Negative. IMPRESSION: No recent insult or explanation for symptoms. Electronically  Signed   By: Tiburcio Pea M.D.   On: 11/18/2022 20:42   CT ABDOMEN PELVIS W CONTRAST  Result Date: 11/16/2022 CLINICAL DATA:  Epigastric pain EXAM: CT ABDOMEN AND PELVIS WITH CONTRAST TECHNIQUE: Multidetector CT imaging of the abdomen and pelvis was performed using the standard protocol following bolus administration of intravenous contrast. RADIATION DOSE REDUCTION: This exam was performed according to the departmental dose-optimization program which includes automated exposure control, adjustment of the mA and/or kV according to patient size and/or use of iterative reconstruction technique. CONTRAST:  OMNIPAQUE IOHEXOL 300 MG/ML  SOLN COMPARISON:  02/11/2021 FINDINGS: Lower chest: Dependent atelectasis. No acute abnormality. Small hiatal hernia. Hepatobiliary: No focal liver abnormality is seen. Status post cholecystectomy. No  biliary dilatation. Pancreas: No focal abnormality or ductal dilatation. Spleen: No focal abnormality.  Normal size. Adrenals/Urinary Tract: No adrenal abnormality. No focal renal abnormality. No stones or hydronephrosis. Urinary bladder is unremarkable. Stomach/Bowel: Left colonic diverticulosis. No active diverticulitis. Stomach and small bowel decompressed, unremarkable. Vascular/Lymphatic: Aortic atherosclerosis. No evidence of aneurysm or adenopathy. Reproductive: Uterus and adnexa unremarkable.  No mass. Other: No free fluid or free air. Musculoskeletal: No acute bony abnormality. IMPRESSION: No acute findings in the abdomen or pelvis. Left colonic diverticulosis.  No active diverticulitis. Aortic atherosclerosis. Electronically Signed   By: Charlett Nose M.D.   On: 11/16/2022 02:44    Microbiology: Results for orders placed or performed during the hospital encounter of 11/15/22  Urine Culture     Status: Abnormal   Collection Time: 11/16/22  3:29 AM   Specimen: Urine, Clean Catch  Result Value Ref Range Status   Specimen Description   Final    URINE, CLEAN  CATCH Performed at [facility], [address], [place], [place] [address]    Special Requests   Final    NONE Performed at [facility], [address] [place], [place] [address]    Culture >=100,000 COLONIES/mL ESCHERICHIA COLI (A)  Final   Report Status 11/18/2022 FINAL  Final   Organism ID, Bacteria ESCHERICHIA COLI (A)  Final      Susceptibility   Escherichia coli - MIC*    AMPICILLIN <=2 SENSITIVE Sensitive     CEFAZOLIN <=4 SENSITIVE Sensitive     CEFEPIME <=0.12 SENSITIVE Sensitive     CEFTRIAXONE <=0.25 SENSITIVE Sensitive     CIPROFLOXACIN <=0.25 SENSITIVE Sensitive     GENTAMICIN <=1 SENSITIVE Sensitive     IMIPENEM <=0.25 SENSITIVE Sensitive     NITROFURANTOIN <=16 SENSITIVE Sensitive     TRIMETH/SULFA <=20 SENSITIVE Sensitive     AMPICILLIN/SULBACTAM <=2 SENSITIVE Sensitive     PIP/TAZO <=4 SENSITIVE Sensitive     * >=100,000 COLONIES/mL ESCHERICHIA COLI  Gastrointestinal Panel by PCR , Stool     Status: Abnormal   Collection Time: 11/17/22  6:56 PM   Specimen: Stool  Result Value Ref Range Status   Campylobacter species NOT DETECTED NOT DETECTED Final   Plesimonas shigelloides NOT DETECTED NOT DETECTED Final   Salmonella species NOT DETECTED NOT DETECTED Final   Yersinia enterocolitica NOT DETECTED NOT DETECTED Final   Vibrio species NOT DETECTED NOT DETECTED Final   Vibrio cholerae NOT DETECTED NOT DETECTED Final   Enteroaggregative E coli (EAEC) NOT DETECTED NOT DETECTED Final   Enteropathogenic E coli (EPEC) DETECTED (A) NOT DETECTED Final    Comment: RESULT CALLED TO, READ BACK BY AND VERIFIED WITH: YANCEY JONES @0916  11/18/22 MJU    Enterotoxigenic E coli (ETEC) NOT DETECTED NOT DETECTED Final   Shiga like toxin producing E coli (STEC) NOT DETECTED NOT DETECTED Final   Shigella/Enteroinvasive E coli (EIEC) NOT DETECTED NOT DETECTED Final   Cryptosporidium NOT DETECTED NOT DETECTED Final   Cyclospora cayetanensis NOT  DETECTED NOT DETECTED Final   Entamoeba histolytica NOT DETECTED NOT DETECTED Final   Giardia lamblia NOT DETECTED NOT DETECTED Final   Adenovirus F40/41 NOT DETECTED NOT DETECTED Final   Astrovirus NOT DETECTED NOT DETECTED Final   Norovirus GI/GII NOT DETECTED NOT DETECTED Final   Rotavirus A NOT DETECTED NOT DETECTED Final   Sapovirus (I, II, IV, and V) NOT DETECTED NOT DETECTED Final    Comment: Performed at [facility], [address]  [place], [place] [address]  C Difficile Quick Screen w PCR reflex     Status: None   Collection Time: 11/17/22  6:56 PM   Specimen: STOOL  Result Value Ref Range Status   C Diff antigen NEGATIVE NEGATIVE Final   C Diff toxin NEGATIVE NEGATIVE Final   C Diff interpretation No C. difficile detected.  Final    Comment: Performed at [facility], [address] [place], [place] [address]    Labs: CBC: Recent Labs  Lab 11/15/22 2353 11/16/22 0723 11/17/22 0545 11/18/22 0526 11/19/22 0610  WBC 12.7* 11.6* 9.2 7.6 8.0  NEUTROABS 9.3*  --   --   --   --   HGB 14.5 12.9 13.0 12.2 12.4  HCT 43.8 39.2 39.3 36.7 37.8  MCV 97.1 97.3 97.3 97.1 98.2  PLT 331 307 325 371 370   Basic Metabolic Panel: Recent Labs  Lab 11/15/22 2353 11/16/22 0723 11/17/22 0545 11/18/22 0526 11/19/22 0610  NA 132* 133* 135 138 139  K 3.3* 3.1* 3.4* 3.2* 3.9  CL 96* 102 102 107 105  CO2 22 20* 23 23 26   GLUCOSE 279* 248* 93 60* 89  BUN 11 8 <5* <5* 8  CREATININE 0.61 0.60 0.54 0.46 0.55  CALCIUM 8.6* 7.7* 8.4* 8.3* 8.7*  MG  --  1.9  --  1.8 1.9  PHOS  --  2.1*  --  2.8  --    Liver Function Tests: Recent Labs  Lab 11/15/22 2353 11/16/22 0723 11/17/22 0545  AST 12* 10* 19  ALT 12 11 13   ALKPHOS 103 98 75  BILITOT 0.8 0.6 0.3  PROT 8.2* 6.9 6.3*  ALBUMIN 3.5 2.9* 2.7*   CBG: Recent Labs  Lab 11/18/22 1635 11/18/22 2106 11/18/22 2353 11/19/22 0751 11/19/22 1132  GLUCAP 117* 132* 135* 75 113*    Discharge time  spent: greater than 30 minutes.  Signed: Meredeth Ide, MD [facility] 11/19/2022

## 2022-11-19 NOTE — TOC Transition Note (Signed)
Transition of Care [facility]) - CM/SW Discharge Note   Patient Details  Name: Robin Montgomery MRN: [id number] Date of Birth: 06/05/1961  Transition of Care [facility]) CM/SW Contact:  Otelia Santee, LCSW Phone Number: 11/19/2022, 8:44 AM   Clinical Narrative:    Pt continued to be recommended for a RW. CSW sent referral to [employer] to have RW delivered to pt's room prior to discharge.       Barriers to Discharge: Continued Medical Work up   Patient Goals and CMS Choice CMS Medicare.gov Compare Post Acute Care list provided to:: Patient Choice offered to / list presented to : Patient  Discharge Placement                         Discharge Plan and Services Additional resources added to the After Visit Summary for   In-house Referral: Clinical Social Work Discharge Planning Services: NA Post Acute Care Choice: Durable Medical Equipment          DME Arranged: Dan Humphreys rolling DME Agency: [employer] Date DME Agency Contacted: 11/19/22 Time DME Agency Contacted: [phone] Representative spoke with at DME Agency: Vaughan Basta            Social Determinants of Health (SDOH) Interventions SDOH Screenings   Housing: Medium Risk (01/19/2021)  Depression (PHQ2-9): Medium Risk (02/28/2022)  Financial Resource Strain: Medium Risk (01/19/2021)  Stress: Stress Concern Present (01/19/2021)  Tobacco Use: High Risk (11/16/2022)     Readmission Risk Interventions    11/19/2022    8:43 AM  Readmission Risk Prevention Plan  Transportation Screening Complete  PCP or Specialist Appt within 5-7 Days Complete  Home Care Screening Complete  Medication Review (RN CM) Complete

## 2022-11-21 ENCOUNTER — Telehealth: Payer: Self-pay

## 2022-11-21 NOTE — Transitions of Care (Post Inpatient/ED Visit) (Signed)
   11/21/2022  Name: [name] MRN: [id number] DOB: 1961-02-06  Today's TOC FU Call Status: Today's TOC FU Call Status:: Unsuccessul Call (1st Attempt) Unsuccessful Call (1st Attempt) Date: 11/21/22  Attempted to reach the patient regarding the most recent Inpatient/ED visit.  Follow Up Plan: Additional outreach attempts will be made to reach the patient to complete the Transitions of Care (Post Inpatient/ED visit) call.   Signature Robyne Peers, RN

## 2022-11-22 ENCOUNTER — Telehealth: Payer: Self-pay

## 2022-11-22 MED ORDER — ACCU-CHEK SOFTCLIX LANCETS MISC: 0 refills | Status: DC

## 2022-11-22 MED ORDER — ACCU-CHEK GUIDE W/DEVICE KIT: PACK | 0 refills | Status: DC

## 2022-11-22 MED ORDER — ACCU-CHEK GUIDE VI STRP: ORAL_STRIP | 0 refills | Status: DC

## 2022-11-22 NOTE — Telephone Encounter (Signed)
Call placed to patient and informed her that the rx for the glucometer was sent to [facility]

## 2022-11-22 NOTE — Transitions of Care (Post Inpatient/ED Visit) (Signed)
11/22/2022  Name: Robin Montgomery MRN: [id number] DOB: July 20, 1960  Today's TOC FU Call Status: Today's TOC FU Call Status:: Successful TOC FU Call Competed Unsuccessful Call (1st Attempt) Date: 11/21/22 [facility] FU Call Complete Date: 11/22/22  Transition Care Management Follow-up Telephone Call Date of Discharge: 11/19/22 Discharge Facility: [facility]) Type of Discharge: Inpatient Admission Primary Inpatient Discharge Diagnosis:: acute cystitis How have you been since you were released from the hospital?: Better Any questions or concerns?: No  Items Reviewed: Did you receive and understand the discharge instructions provided?: Yes Medications obtained,verified, and reconciled?: Partial Review Completed Reason for Partial Mediation Review: She said she does not have any of the new medications and she is waiting for them to be delivered from [facility]. She has her insulin but does not have a glucometer. She said she did not have any questions about the med regime but would call the clinic if she has questions. Any new allergies since your discharge?: No Dietary orders reviewed?: Yes Type of Diet Ordered:: heart healthy , diabetic Do you have support at home?: Yes People in Home: alone Name of Support/Comfort Primary Source: She stated that her sister checks in on her  Medications Reviewed Today: Medications Reviewed Today     Reviewed by Valda Lamb, CPhT (Pharmacy Technician) on 11/16/22 at 0111  Med List Status: Complete   Medication Order Taking? Sig Documenting Provider Last Dose Status Informant  Accu-Chek Softclix Lancets lancets [id number]  Use as instructed to check blood sugar twice daily. E11.65 Claiborne Rigg, NP  Active Self  acetaminophen (TYLENOL) 500 MG tablet [id number] Yes Take 1 tablet (500 mg total) by mouth every 6 (six) hours as needed.  Patient taking differently: Take 500 mg by mouth every 6 (six) hours as needed for moderate pain.   Lorelee New, PA-C Past Month Active Self  ARIPiprazole (ABILIFY) 10 MG tablet [id number] No TAKE ONE TABLET BY MOUTH EVERYDAY AT BEDTIME  Patient not taking: Reported on 11/16/2022   Shanna Cisco, NP Not Taking Active Self  atorvastatin (LIPITOR) 40 MG tablet [id number] No TAKE ONE TABLET BY MOUTH ONCE DAILY FOR CHOLESTEROL  Patient not taking: Reported on 11/16/2022   Claiborne Rigg, NP Not Taking Active Self  AUSTEDO 12 MG TABS [id number] No TAKE THREE TABLETS BY MOUTH ONCE DAILY  Patient not taking: Reported on 11/16/2022   Shanna Cisco, NP Not Taking Active Self  Blood Glucose Monitoring Suppl (ACCU-CHEK GUIDE ME) w/Device KIT [id number]  Use to check blood sugar twice daily. E11.65 Claiborne Rigg, NP  Active Self  Blood Pressure Monitor DEVI [id number]  Please provide patient with insurance approved blood pressure monitor.  Patient not taking: Reported on 02/28/2022   Claiborne Rigg, NP  Active Self  empagliflozin (JARDIANCE) 10 MG TABS tablet [id number] No Take 1 tablet (10 mg total) by mouth daily before breakfast.  Patient not taking: Reported on 11/16/2022   Claiborne Rigg, NP Not Taking Active Self  glucose blood (ACCU-CHEK GUIDE) test strip [id number]  Use to check blood sugar twice daily. E11.65 Claiborne Rigg, NP  Active Self  insulin detemir (LEVEMIR FLEXTOUCH) 100 UNIT/ML FlexPen [id number] No Inject 30 Units into the skin 2 (two) times daily. For diabetes  Patient not taking: Reported on 11/16/2022   Claiborne Rigg, NP Not Taking Active Self  Insulin Pen Needle (TECHLITE PEN NEEDLES) 31G X 5 MM MISC [id number]  use as instructed.inject into the skin once nightly Meredeth Ide,  Shea Stakes, NP  Active Self  lisinopril (ZESTRIL) 40 MG tablet [id number] No Take 1 tablet (40 mg total) by mouth every morning. Please make PCP appointment.  Patient not taking: Reported on 11/16/2022   Hoy Register, MD Not Taking Active Self  metFORMIN (GLUCOPHAGE-XR) 500 MG 24 hr tablet [id number] No  TAKE ONE TABLET BY MOUTH ONCE DAILY  Patient not taking: Reported on 11/16/2022   Claiborne Rigg, NP Not Taking Active Self  Misc. Devices [facility]) MISC [id number]  Please provide patient with single cane. M70.62 Claiborne Rigg, NP  Active Self  naproxen (NAPROSYN) 375 MG tablet [id number] No Take 1 tablet (375 mg total) by mouth 2 (two) times daily.  Patient not taking: Reported on 11/16/2022   Linwood Dibbles, MD Not Taking Active Self  omeprazole (PRILOSEC) 40 MG capsule [id number] No TAKE ONE CAPSULE BY MOUTH EVERY EVENING FOR acid reflux  Patient not taking: Reported on 11/16/2022   Claiborne Rigg, NP Not Taking Active Self  SYMBICORT 160-4.5 MCG/ACT inhaler [id number] No INHALE TWO PUFFS BY MOUTH INTO LUNGS IN THE MORNING AND AT bedtime Needs appointment for further refills  Patient not taking: Reported on 11/16/2022   Hoy Register, MD Not Taking Active Self  traZODone (DESYREL) 100 MG tablet [id number] No Take 1 tablet (100 mg total) by mouth at bedtime. Please schedule PCP appointment for more refills.  Patient not taking: Reported on 11/16/2022   Hoy Register, MD Not Taking Active Self  traZODone (DESYREL) 50 MG tablet [id number] No TAKE 1/2 TABLET BY MOUTH EVERYDAY AT BEDTIME FOR SLEEP  Patient not taking: Reported on 11/16/2022   Hoy Register, MD Not Taking Active Self  VENTOLIN HFA 108 (90 Base) MCG/ACT inhaler [id number] No INHALE 1 PUFF BY MOUTH INTO LUNGS EVERY 6 HOURS AS NEEDED FOR WHEEZING AND/OR SHORTNESS OF BREATH Needs appointment for further refills  Patient not taking: Reported on 11/16/2022   Claiborne Rigg, NP Not Taking Active Self            Home Care and Equipment/Supplies: Were Home Health Services Ordered?: No Any new equipment or medical supplies ordered?: Yes Name of Medical supply agency?: [employer] Were you able to get the equipment/medical supplies?: Yes (She got it but left it at the hospital.. I told her that she should call the floor she was  on and let them know she will pick it up. She said she understood but did not think she needs it) Do you have any questions related to the use of the equipment/supplies?: No  Functional Questionnaire: Do you need assistance with bathing/showering or dressing?: No Do you need assistance with meal preparation?: No Do you need assistance with eating?: No Do you have difficulty maintaining continence: No Do you need assistance with getting out of bed/getting out of a chair/moving?: No Do you have difficulty managing or taking your medications?: No  Follow up appointments reviewed: PCP Follow-up appointment confirmed?: Yes Date of PCP follow-up appointment?: 12/14/22 Follow-up Provider: Corene Cornea, PA Specialist Hospital Follow-up appointment confirmed?: Yes Date of Specialist follow-up appointment?: 12/01/22 Follow-Up Specialty Provider:: podiatry. Her AVS instructs her to call [facility], [facility] and [facility].  I asked her about contacting those clinics and she said she is okay now, and doesn't need them Do you need transportation to your follow-up appointment?: No (She said she will contact Medicaid transportation) Do you understand care options if your condition(s) worsen?: Yes-patient verbalized understanding    SIGNATURE Robyne Peers, RN

## 2022-11-22 NOTE — Telephone Encounter (Signed)
Robin Montgomery, are you able to order a glucometer for her?   Her pharmacy is [facility].  thanks

## 2022-11-22 NOTE — Telephone Encounter (Signed)
Yes ma'am, rxn sent. 

## 2022-12-01 ENCOUNTER — Ambulatory Visit: Payer: Medicaid Other | Admitting: Podiatry

## 2022-12-14 ENCOUNTER — Inpatient Hospital Stay: Payer: Medicaid Other | Admitting: Physician Assistant

## 2022-12-16 ENCOUNTER — Ambulatory Visit: Payer: Medicaid Other | Admitting: Podiatry

## 2023-10-31 ENCOUNTER — Inpatient Hospital Stay (HOSPITAL_COMMUNITY)
Admission: EM | Admit: 2023-10-31 | Discharge: 2023-11-07 | DRG: 918 | Disposition: A | Discharge status: Other Acute Inpatient Hospital | Payer: MEDICAID | Attending: Family Medicine | Admitting: Family Medicine

## 2023-10-31 ENCOUNTER — Inpatient Hospital Stay (HOSPITAL_COMMUNITY): Payer: MEDICAID

## 2023-10-31 ENCOUNTER — Encounter (HOSPITAL_COMMUNITY): Payer: Self-pay

## 2023-10-31 ENCOUNTER — Other Ambulatory Visit: Payer: Self-pay

## 2023-10-31 DIAGNOSIS — F17210 Nicotine dependence, cigarettes, uncomplicated: Secondary | ICD-10-CM | POA: Diagnosis present

## 2023-10-31 DIAGNOSIS — E512 Wernicke's encephalopathy: Secondary | ICD-10-CM | POA: Diagnosis present

## 2023-10-31 DIAGNOSIS — T383X5A Adverse effect of insulin and oral hypoglycemic [antidiabetic] drugs, initial encounter: Principal | ICD-10-CM

## 2023-10-31 DIAGNOSIS — Z9151 Personal history of suicidal behavior: Secondary | ICD-10-CM | POA: Diagnosis not present

## 2023-10-31 DIAGNOSIS — Z046 Encounter for general psychiatric examination, requested by authority: Secondary | ICD-10-CM

## 2023-10-31 DIAGNOSIS — I10 Essential (primary) hypertension: Secondary | ICD-10-CM | POA: Diagnosis present

## 2023-10-31 DIAGNOSIS — Z79899 Other long term (current) drug therapy: Secondary | ICD-10-CM | POA: Diagnosis not present

## 2023-10-31 DIAGNOSIS — E162 Hypoglycemia, unspecified: Secondary | ICD-10-CM | POA: Diagnosis not present

## 2023-10-31 DIAGNOSIS — F4310 Post-traumatic stress disorder, unspecified: Secondary | ICD-10-CM | POA: Diagnosis present

## 2023-10-31 DIAGNOSIS — E785 Hyperlipidemia, unspecified: Secondary | ICD-10-CM | POA: Diagnosis present

## 2023-10-31 DIAGNOSIS — T383X2A Poisoning by insulin and oral hypoglycemic [antidiabetic] drugs, intentional self-harm, initial encounter: Principal | ICD-10-CM | POA: Diagnosis present

## 2023-10-31 DIAGNOSIS — Z720 Tobacco use: Secondary | ICD-10-CM | POA: Diagnosis present

## 2023-10-31 DIAGNOSIS — F1994 Other psychoactive substance use, unspecified with psychoactive substance-induced mood disorder: Secondary | ICD-10-CM | POA: Diagnosis present

## 2023-10-31 DIAGNOSIS — F332 Major depressive disorder, recurrent severe without psychotic features: Secondary | ICD-10-CM | POA: Diagnosis present

## 2023-10-31 DIAGNOSIS — G8929 Other chronic pain: Secondary | ICD-10-CM | POA: Diagnosis present

## 2023-10-31 DIAGNOSIS — R4182 Altered mental status, unspecified: Secondary | ICD-10-CM

## 2023-10-31 DIAGNOSIS — Y929 Unspecified place or not applicable: Secondary | ICD-10-CM | POA: Diagnosis not present

## 2023-10-31 DIAGNOSIS — Z1152 Encounter for screening for COVID-19: Secondary | ICD-10-CM | POA: Diagnosis not present

## 2023-10-31 DIAGNOSIS — Z7984 Long term (current) use of oral hypoglycemic drugs: Secondary | ICD-10-CM | POA: Diagnosis not present

## 2023-10-31 DIAGNOSIS — F1510 Other stimulant abuse, uncomplicated: Secondary | ICD-10-CM | POA: Diagnosis present

## 2023-10-31 DIAGNOSIS — R410 Disorientation, unspecified: Secondary | ICD-10-CM | POA: Diagnosis present

## 2023-10-31 DIAGNOSIS — F1090 Alcohol use, unspecified, uncomplicated: Secondary | ICD-10-CM | POA: Diagnosis present

## 2023-10-31 DIAGNOSIS — F1020 Alcohol dependence, uncomplicated: Secondary | ICD-10-CM | POA: Diagnosis not present

## 2023-10-31 DIAGNOSIS — Z751 Person awaiting admission to adequate facility elsewhere: Secondary | ICD-10-CM

## 2023-10-31 DIAGNOSIS — F1010 Alcohol abuse, uncomplicated: Secondary | ICD-10-CM | POA: Diagnosis present

## 2023-10-31 DIAGNOSIS — F1410 Cocaine abuse, uncomplicated: Secondary | ICD-10-CM | POA: Diagnosis present

## 2023-10-31 DIAGNOSIS — X838XXA Intentional self-harm by other specified means, initial encounter: Secondary | ICD-10-CM

## 2023-10-31 DIAGNOSIS — E11649 Type 2 diabetes mellitus with hypoglycemia without coma: Secondary | ICD-10-CM | POA: Diagnosis present

## 2023-10-31 DIAGNOSIS — Z794 Long term (current) use of insulin: Secondary | ICD-10-CM | POA: Diagnosis not present

## 2023-10-31 DIAGNOSIS — F1290 Cannabis use, unspecified, uncomplicated: Secondary | ICD-10-CM | POA: Diagnosis present

## 2023-10-31 DIAGNOSIS — F1590 Other stimulant use, unspecified, uncomplicated: Secondary | ICD-10-CM | POA: Diagnosis present

## 2023-10-31 DIAGNOSIS — T1491XA Suicide attempt, initial encounter: Secondary | ICD-10-CM

## 2023-10-31 DIAGNOSIS — F1910 Other psychoactive substance abuse, uncomplicated: Secondary | ICD-10-CM | POA: Diagnosis present

## 2023-10-31 DIAGNOSIS — E160 Drug-induced hypoglycemia without coma: Principal | ICD-10-CM

## 2023-10-31 DIAGNOSIS — Y900 Blood alcohol level of less than 20 mg/100 ml: Secondary | ICD-10-CM | POA: Diagnosis present

## 2023-10-31 DIAGNOSIS — E119 Type 2 diabetes mellitus without complications: Secondary | ICD-10-CM

## 2023-10-31 DIAGNOSIS — T383X1A Poisoning by insulin and oral hypoglycemic [antidiabetic] drugs, accidental (unintentional), initial encounter: Secondary | ICD-10-CM

## 2023-10-31 DIAGNOSIS — Z789 Other specified health status: Secondary | ICD-10-CM

## 2023-10-31 LAB — CBC WITH DIFFERENTIAL/PLATELET
Abs Immature Granulocytes: 0.06 10*3/uL (ref 0.00–0.07)
Basophils Absolute: 0 10*3/uL (ref 0.0–0.1)
Basophils Relative: 0 %
Eosinophils Absolute: 0 10*3/uL (ref 0.0–0.5)
Eosinophils Relative: 0 %
HCT: 47.3 % — ABNORMAL HIGH (ref 36.0–46.0)
Hemoglobin: 15.5 g/dL — ABNORMAL HIGH (ref 12.0–15.0)
Immature Granulocytes: 1 %
Lymphocytes Relative: 15 %
Lymphs Abs: 1.8 10*3/uL (ref 0.7–4.0)
MCH: 32.4 pg (ref 26.0–34.0)
MCHC: 32.8 g/dL (ref 30.0–36.0)
MCV: 98.7 fL (ref 80.0–100.0)
Monocytes Absolute: 0.6 10*3/uL (ref 0.1–1.0)
Monocytes Relative: 5 %
Neutro Abs: 9.3 10*3/uL — ABNORMAL HIGH (ref 1.7–7.7)
Neutrophils Relative %: 79 %
Platelets: 354 10*3/uL (ref 150–400)
RBC: 4.79 MIL/uL (ref 3.87–5.11)
RDW: 13.4 % (ref 11.5–15.5)
WBC: 11.8 10*3/uL — ABNORMAL HIGH (ref 4.0–10.5)
nRBC: 0 % (ref 0.0–0.2)

## 2023-10-31 LAB — BLOOD GAS, VENOUS
Acid-Base Excess: 5.4 mmol/L — ABNORMAL HIGH (ref 0.0–2.0)
Bicarbonate: 29.9 mmol/L — ABNORMAL HIGH (ref 20.0–28.0)
Drawn by: 8521
O2 Saturation: 60.6 %
Patient temperature: 37.1
pCO2, Ven: 42 mmHg — ABNORMAL LOW (ref 44–60)
pH, Ven: 7.46 — ABNORMAL HIGH (ref 7.25–7.43)
pO2, Ven: 34 mmHg (ref 32–45)

## 2023-10-31 LAB — URINALYSIS, W/ REFLEX TO CULTURE (INFECTION SUSPECTED)
Bilirubin Urine: NEGATIVE
Glucose, UA: >=500 mg/dL — AB
Ketones, ur: NEGATIVE mg/dL
Leukocytes,Ua: NEGATIVE
Nitrite: NEGATIVE
Protein, ur: 100 mg/dL — AB
Specific Gravity, Urine: 1.013 (ref 1.005–1.030)
pH: 5 (ref 5.0–8.0)

## 2023-10-31 LAB — ETHANOL: Alcohol, Ethyl (B): <15 mg/dL (ref <15)

## 2023-10-31 LAB — COMPREHENSIVE METABOLIC PANEL WITH GFR
ALT: 19 U/L (ref 0–44)
AST: 34 U/L (ref 15–41)
Albumin: 4.3 g/dL (ref 3.5–5.0)
Alkaline Phosphatase: 71 U/L (ref 38–126)
Anion gap: 14 (ref 5–15)
BUN: 14 mg/dL (ref 8–23)
CO2: 22 mmol/L (ref 22–32)
Calcium: 9.5 mg/dL (ref 8.9–10.3)
Chloride: 102 mmol/L (ref 98–111)
Creatinine, Ser: 0.62 mg/dL (ref 0.44–1.00)
GFR, Estimated: >60 mL/min (ref >60)
Glucose, Bld: 179 mg/dL — ABNORMAL HIGH (ref 70–99)
Potassium: 4 mmol/L (ref 3.5–5.1)
Sodium: 138 mmol/L (ref 135–145)
Total Bilirubin: 0.8 mg/dL (ref 0.0–1.2)
Total Protein: 7.8 g/dL (ref 6.5–8.1)

## 2023-10-31 LAB — CBG MONITORING, ED
Glucose-Capillary: 180 mg/dL — ABNORMAL HIGH (ref 70–99)
Glucose-Capillary: 33 mg/dL — CL (ref 70–99)
Glucose-Capillary: 131 mg/dL — ABNORMAL HIGH (ref 70–99)
Glucose-Capillary: 74 mg/dL (ref 70–99)
Glucose-Capillary: 126 mg/dL — ABNORMAL HIGH (ref 70–99)

## 2023-10-31 LAB — I-STAT CHEM 8, ED
BUN: 20 mg/dL (ref 8–23)
Calcium, Ion: 1.19 mmol/L (ref 1.15–1.40)
Chloride: 103 mmol/L (ref 98–111)
Creatinine, Ser: 0.4 mg/dL — ABNORMAL LOW (ref 0.44–1.00)
Glucose, Bld: 177 mg/dL — ABNORMAL HIGH (ref 70–99)
HCT: 49 % — ABNORMAL HIGH (ref 36.0–46.0)
Hemoglobin: 16.7 g/dL — ABNORMAL HIGH (ref 12.0–15.0)
Potassium: 4 mmol/L (ref 3.5–5.1)
Sodium: 137 mmol/L (ref 135–145)
TCO2: 23 mmol/L (ref 22–32)

## 2023-10-31 LAB — CK: Total CK: 98 U/L (ref 38–234)

## 2023-10-31 LAB — SALICYLATE LEVEL: Salicylate Lvl: <7 mg/dL — ABNORMAL LOW (ref 7.0–30.0)

## 2023-10-31 LAB — GLUCOSE, CAPILLARY
Glucose-Capillary: 142 mg/dL — ABNORMAL HIGH (ref 70–99)
Glucose-Capillary: 148 mg/dL — ABNORMAL HIGH (ref 70–99)
Glucose-Capillary: 158 mg/dL — ABNORMAL HIGH (ref 70–99)

## 2023-10-31 LAB — TSH: TSH: 0.479 u[IU]/mL (ref 0.350–4.500)

## 2023-10-31 LAB — RAPID URINE DRUG SCREEN, HOSP PERFORMED
Amphetamines: NOT DETECTED
Barbiturates: NOT DETECTED
Benzodiazepines: NOT DETECTED
Cocaine: POSITIVE — AB
Opiates: NOT DETECTED
Tetrahydrocannabinol: POSITIVE — AB

## 2023-10-31 LAB — ACETAMINOPHEN LEVEL: Acetaminophen (Tylenol), Serum: <10 ug/mL — ABNORMAL LOW (ref 10–30)

## 2023-10-31 MED ORDER — ADULT MULTIVITAMIN W/MINERALS CH
1.0000 | ORAL_TABLET | Freq: Every day | ORAL | Status: DC
  Administered 2023-10-31 – 2023-11-07 (×8): 1 via ORAL
  Filled 2023-10-31 (×8): qty 1

## 2023-10-31 MED ORDER — THIAMINE HCL 100 MG/ML IJ SOLN
400.0000 mg | Freq: Three times a day (TID) | INTRAVENOUS | Status: AC
  Administered 2023-10-31 – 2023-11-02 (×6): 400 mg via INTRAVENOUS
  Filled 2023-10-31: qty 4
  Filled 2023-10-31: qty 2
  Filled 2023-10-31 (×6): qty 4

## 2023-10-31 MED ORDER — DEXTROSE-SODIUM CHLORIDE 5-0.9 % IV SOLN: INTRAVENOUS | Status: DC

## 2023-10-31 MED ORDER — THIAMINE MONONITRATE 100 MG PO TABS: 100.0000 mg | ORAL_TABLET | Freq: Every day | ORAL | Status: DC

## 2023-10-31 MED ORDER — ENOXAPARIN SODIUM 40 MG/0.4ML IJ SOSY
40.0000 mg | PREFILLED_SYRINGE | INTRAMUSCULAR | Status: DC
  Administered 2023-10-31 – 2023-11-06 (×7): 40 mg via SUBCUTANEOUS
  Filled 2023-10-31 (×7): qty 0.4

## 2023-10-31 MED ORDER — ACETAMINOPHEN 650 MG RE SUPP: 650.0000 mg | Freq: Four times a day (QID) | RECTAL | Status: DC | PRN

## 2023-10-31 MED ORDER — THIAMINE HCL 100 MG/ML IJ SOLN
100.0000 mg | Freq: Every day | INTRAMUSCULAR | Status: DC
  Administered 2023-10-31: 100 mg via INTRAVENOUS
  Filled 2023-10-31: qty 2

## 2023-10-31 MED ORDER — DEXTROSE 50 % IV SOLN: 50.0000 mL | Freq: Once | INTRAVENOUS | Status: AC

## 2023-10-31 MED ORDER — THIAMINE HCL 100 MG/ML IJ SOLN
200.0000 mg | Freq: Three times a day (TID) | INTRAVENOUS | Status: DC
  Filled 2023-10-31 (×2): qty 2

## 2023-10-31 MED ORDER — ACETAMINOPHEN 500 MG PO TABS
1000.0000 mg | ORAL_TABLET | Freq: Four times a day (QID) | ORAL | Status: DC | PRN
  Administered 2023-10-31 – 2023-11-07 (×8): 1000 mg via ORAL
  Filled 2023-10-31 (×8): qty 2

## 2023-10-31 MED ORDER — FOLIC ACID 1 MG PO TABS
1.0000 mg | ORAL_TABLET | Freq: Every day | ORAL | Status: DC
  Administered 2023-10-31 – 2023-11-07 (×8): 1 mg via ORAL
  Filled 2023-10-31 (×8): qty 1

## 2023-10-31 MED ORDER — DEXTROSE 50 % IV SOLN
INTRAVENOUS | Status: AC
  Administered 2023-10-31: 50 mL via INTRAVENOUS
  Filled 2023-10-31: qty 50

## 2023-10-31 MED ORDER — THIAMINE HCL 100 MG/ML IJ SOLN: 200.0000 mg | INTRAVENOUS | Status: DC

## 2023-10-31 NOTE — Assessment & Plan Note (Addendum)
 Patient reports use of multiple substances including 1 pint of vodka daily, smokes crack cocaine daily, frequently uses marijuana, and smokes 1/2 pack of cigarettes daily. Concern for alcohol withdrawal during hospitalization, CIWAS Q6H as noted in assessment and plan.  - CIWAS Q6H  - IV thiamine  following Wernicke's treatment order  - Folic acid 1 mg daily  - Multivitamin daily  - Consider starting NRT on 5/28

## 2023-10-31 NOTE — Assessment & Plan Note (Signed)
 Blood glucose of 33 in ED after IV dextrose. Patient prescribed Levemir  outpatient and unsure how much she took on 5/26. Repeat hypoglycemia after dextrose in ED likely due to continued effects of Levemir . Patient able to PO at this time so no IV fluids started. Not ordering SSI at time of admission due to repeat hypoglycemia. Spoke with pharmacy, they advised levemir  may impact blood sugar for up to 24 - 48 hours and advise close monitoring for tonight with revaluation in the morning.  - Admit to [facility] Progressive under attending Richad Champagne Rumball DO  - CBG Q1H - Hold home Levemir  - Not starting SSI at this time

## 2023-10-31 NOTE — ED Triage Notes (Signed)
 Pt to ED via [facility] from home. Pt was only responding to pain. FSBS = 31. Possibly too much insulin  accidental per pt. Pt given 25gm dextrose by EMS, last fsbs = 222.  EMS states family member on scene states overdose may have been on purpose. Pt alert, c/o being cold   20g R wrist by EMS 190/100 97% RA HR 70s

## 2023-10-31 NOTE — Progress Notes (Signed)
 CSW added substance abuse resources to patient's AVS.  Edwin Dada, MSW, LCSW Transitions of Care  Clinical Social Worker II [phone]

## 2023-10-31 NOTE — Plan of Care (Addendum)
 FMTS Brief Progress Note  S:Patient is awake and oriented on exam this evening. She reports intentionally overdosing her insulin  yesterday in an attempt to end her life. She had previously been taking drug and alcohol classes, which she reports were very helpful to giving her clean and sober, however they ended 2 weeks ago at which point she immediately started using crack cocaine and drinking alcohol again.  Her last drink was yesterday, as well as her last use of crack.  Around this time her depression symptoms started intensifying.  She cannot pinpoint a specific trigger for her suicide attempt but notes that sometimes her thoughts just go dark.  She does not have any current intentions of harming herself or anyone else and is agreeable to having a one-to-one sitter in the room for safety.   O: BP 133/80 (BP Location: Right Arm)   Pulse (!) 107   Temp 98.7 F (37.1 C) (Oral)   Resp (!) 24   Ht 5\' 6"  (1.676 m)   Wt 65 kg   SpO2 99%   BMI 23.13 kg/m   General: Well-appearing elderly female, no distress. Respiratory: CTAB, no increased work of breathing Cardiac: RRR, no M/R/G Neuro: Alert and oriented to person time place and situation.  Cranial nerves II through XII grossly intact bilaterally.  5 out of 5 strength bilaterally, intact sensation bilaterally in the upper and lower extremities.  A/P: Intentional overdose of insulin  Continue oral intake to boost blood sugar.  Continue every 1 hour CBGs overnight.  Tele-sitter order placed, psychiatry consult placed. - Orders reviewed. Labs for AM ordered, which was adjusted as needed.  - If condition changes, plan includes rapid reassessment and evaluation.  Could consider IVC if patient becomes agitated or show signs of becoming a danger to herself or anyone else.   Rayma Calandra, DO 10/31/2023, 8:33 PM PGY-1, [facility] Night Resident  Please page [phone] with questions.

## 2023-10-31 NOTE — ED Notes (Signed)
 CCMD contacted

## 2023-10-31 NOTE — Hospital Course (Addendum)
 Robin Montgomery is a 63 y.o.female with a history of insulin -dependent T2DM, polysubstance use, MDD, HTN who was admitted to the [facility] Medicine Teaching Service at [facility] for hypoglycemia.   Her hospital course is detailed below:  Intentional overdose on insulin  Patient reported intentionally overdosing her Levemir  in an attempt to commit suicide on the day prior to her admission to the hospital.  She has had 1 prior suicide attempt approximately 10 years ago.  She reports that she has struggled with her mental health since her drug and alcohol classes ended approximately 2 weeks ago.  Psychiatry was consulted and recommended inpatient psychiatry, starting sertraline  50 daily for MDD, and risperdal  0.5 mg night for auditory hallucinations.  On 5/31 patient changed her mind about voluntary admission to [facility] and required IVC.  Hypoglycemia Presented to ED via EMS with blood glucose to 33 which recovered with D50. Pt did become altered again after presenting to ED and was found again to be severely hypoglycemic. Other labs showed CMP grossly unremarkable, CBC with mild leukocytosis, UA with glucosuria and bacteria.  CBGs were collected every 1 hour following her admission to the hospital with steady improvement.  We encouraged p.o. intake and her blood sugar responded appropriately.  T2DM Stopped long acting insulin  due to intentional overdose x2 and plan to switch to oral agent w/o OD potential.  Reports that she does not want metformin  and has had very bad diarrhea in past in this medication. A1c 9.5. For cost adherence and reducing risk of overdose, patient was started on glimepiride  1 mg. Would also recommend adding on pioglitazone. Glimepiride  has lowest risk of fatal overdose for sulfonylurea in [place]  and pioglitazone has no OD potential.   AMS Pt altered/somnolent on arrival, thought most likely due to hypoglycemia, however other workup completed as indicated. TSH WNL. CXR showed no acute  cardiopulmonary disease. Because she may have fallen and complained of frontal HA, CT Head ordered and showed no acute intracranial process. UDS positive for cocaine and THC. Ethanol level <15. CK WNL, fortunately no concern for rhabdomyolysis. Improved to baseline within 24 hours supporting hypoglycemia induced.   Alcohol use disorder  Stimulant use disorder (cocaine)  Nicotine  use Per patient was drinking 1 pint of vodka daily, recently in rehab, also endorsed cocaine use and was positive on UDS for cocaine and THC.  CIWA's were minimal during hospitalization.  Patient endorsed soaking cigarettes and asked for nicotine  patch highest strength and was given 21 mg once daily. Did not discuss with patient rehab given going to inpatient psychiatry.     PCP Follow-up Recommendations: Defer to PCP for T2DM medications including restarting insulin  if indicated. Otherwise can titrate glimepiride  and add pioglitazone.  Ensure follow up with psychiatry / substance use resources.

## 2023-10-31 NOTE — ED Notes (Signed)
 Pt repositioned in bed given orange juice and sandwich and encouraged to eat as able.

## 2023-10-31 NOTE — Assessment & Plan Note (Addendum)
 See plan under hypoglycemia.

## 2023-10-31 NOTE — Progress Notes (Signed)
 This patient was pended to rm 3 at 80. Pt arrived to floor by transporter at 1920. This RN did not have a chance to look over the chart or anything. Patient was placed in bed by transporter. No nurse or tech was available to admit the patient at the time they arrived.   Assigned RN was not aware that she was getting a patient.   Attempted to call ED. Nobody answered. Another phone call was made to ED charge nurse. Call went to voicemail.

## 2023-10-31 NOTE — Assessment & Plan Note (Signed)
 Chronic problem, history of suicide attempt by overdose on cocaine and stopping hypertensive and insulin  in 2015. Unsure if patient's current presentation was due to purposeful self harm at this time due to AMS.  - Revaluate AMS 5/28

## 2023-10-31 NOTE — H&P (Addendum)
 Hospital Admission History and Physical Service Pager: [phone]  Patient name: Robin Montgomery Medical record number: [id number] Date of Birth: [birth year] Age: 63 y.o. Gender: female  Primary Care Provider: Collins Dean, NP Consultants: None Code Status: Full confirmed by patient  Preferred Emergency Contact: Patient declines any emergency contact at time of admission, chart shows Loma Rising (relative)  Chief Complaint: AMS  Assessment and Plan: Robin Montgomery is a 63 y.o. female with history of insulin  dependent T2DM, polysubstance abuse, major depressive disorder, and hypertension presenting via EMS with AMS and hypoglycemia. Differential for presentation of this includes insulin  overdose, stroke, head injury, seizure, infection, metabolic derangement, or other medication misadventure. Most likely is insulin  overdose given patient medical history, patient report of taking unknown amount of insulin  last night, repetitive hypoglycemia. Question if this was purposeful or not given history of suicide attempt by substance OD and medication non-adherence in 2021. Given patient's poor recount of preceding events cannot rule out possible stroke given history of HTN and chronic tobacco use. Also consider head injury given patient report of waking on the ground and tenderness of superior forehead. Less likely is seizure, given patient is not post ictal and oriented; infection, patient denies any recent cold symptoms or sick contacts; metabolic derangements or other medication misadventures given, given CMP unremarkable.   Will continue to encourage PO intake for patient with glucose monitoring every hour at this time. Will continue to work up other possible causes with CT head, CXR, TSH, salicylate level, ehtanol, EKG, and VBG. Given that patient was found down will also get CK to assess for possible rhabdomyolysis. Patient reports significant alcohol use so will start with CIWAs Q6H, hesitant  to increase frequency of monitoring at this time due to concern for misadministration of benzodiazapine for alcohol withdrawal in setting of confusion or symptoms more likely due to glycemic derangements. Will consider increasing frequency of CIWA monitoring tomorrow dependent on patient mentation.     Assessment & Plan Hypoglycemia Blood glucose of 33 in ED after IV dextrose. Patient prescribed Levemir  outpatient and unsure how much she took on 5/26. Repeat hypoglycemia after dextrose in ED likely due to continued effects of Levemir . Patient able to PO at this time so no IV fluids started. Not ordering SSI at time of admission due to repeat hypoglycemia. Spoke with pharmacy, they advised levemir  may impact blood sugar for up to 24 - 48 hours and advise close monitoring for tonight with revaluation in the morning.  - Admit to [facility] Progressive under attending Richad Champagne Rumball DO  - CBG Q1H - Hold home Levemir  - Not starting SSI at this time  AMS (altered mental status) Suspected due to hypoglycemia, but given poor history from patient pursuing other workup for possible stroke, head injury, other medication misadventure, and infection as noted above. Was found down so will evaluate for possible rhabdomyolysis as well. - EKG - CT head w/o contrast - Acetaminophen  level - Salicylate level - Ethanol - VBG - CK - TSH - Fall precautions  - Delirium precautions  - PT/OT eval  - Tylenol  1 g Q6H PRN Polysubstance abuse (HCC) Patient reports use of multiple substances including 1 pint of vodka daily, smokes crack cocaine daily, frequently uses marijuana, and smokes 1/2 pack of cigarettes daily. Concern for alcohol withdrawal during hospitalization, CIWAS Q6H as noted in assessment and plan.  - CIWAS Q6H  - IV thiamine  following Wernicke's treatment order  - Folic acid 1 mg daily  -  Multivitamin daily  - Consider starting NRT on 5/28 MDD (major depressive disorder), recurrent severe, without psychosis  (HCC) Chronic problem, history of suicide attempt by overdose on cocaine and stopping hypertensive and insulin  in 2015. Unsure if patient's current presentation was due to purposeful self harm at this time due to AMS.  - Revaluate AMS 5/28 Diabetes mellitus type 2, insulin  dependent (HCC) See plan under hypoglycemia.  Chronic and Stable Problems:  Med Rec pending patient altered mental status, will need to get more information as patient improves and restart home meds as appropriate HTN: Pending home meds Chronic wrist pain: Tylenol  PRN   FEN/GI: Carb modified diet  VTE Prophylaxis: Lovenox    Disposition: Admit to FMTS progressive for observation   History of Present Illness:  Robin Montgomery is a 63 y.o. female presenting with AMS, brought in by EMS. She was seen sleeping comfortably under several blankets. She awoke to voice and was oriented. She could not recount why she was in the hospital. She reports taking insulin  last night but is unsure how much. She woke up at some point today and found herself on the floor. She was able to crawl to bed. She denied chest pain, SOB, medication changes, recent cold symptoms or recent sick contacts. She has had some decreased appetite since last Thursday. She reports drinking 1 pint of vodka per day, smoking 1/2 pack of cigarettes a day, smoking crack daily, and frequently smoking marijuana. She last had alcohol on 5/26. She is unsure of what medications she takes at home. Patient sleepy and frequently drifted off during interview, requiring frequent verbal stimulus. Patient was able to drink juice during interview.  In the ED, patient's mental status improved with IV dextrose administered by EMS. CBG 179 in ED then 33 on repeat an hour later. PO intake was encouraged and blood sugar improved. CMP was unremarkable. CBC showed mild leukocytosis and increased hemoglobin. UA with glucosuria and proteinuria. Utox positive for cocaine and THC.   Review Of  Systems: Per HPI  Pertinent Past Medical History: T2DM with insulin  dependence  HTN  Polysubstance abuse  MDD HLD Arthralgia of multiple joints  Remainder reviewed in history tab.   Pertinent Past Surgical History: Cholecystectomy   Remainder reviewed in history tab.   Pertinent Social History: - Alcohol: 1 pint vodka daily  - Cocaine: Smokes Crack cocaine daily  - Marijuana - Nicotine : 1/2 pack daily for years - Assess need for nicotine  patch as AMS resolves Lives alone and denies any support persons.   Pertinent Family History: Patient unable to answer at time of admission and none documented in chart Remainder reviewed in history tab.   Important Outpatient Medications: Levemir , unsure of dosing  Patient not able to answer at time of admission, pending med rec  Remainder reviewed in medication history.   Objective: BP (!) 150/78   Pulse 77   Temp 98 F (36.7 C) (Temporal)   Resp 14   Ht 5\' 6"  (1.676 m)   Wt 65 kg   SpO2 100%   BMI 23.13 kg/m  Exam: General: Somnolent, but arouses to voice and able to speak and drink liquid without difficulty. Oriented x 3 Eyes: No conjunctival erythema and normal gross extraocular movements  Head: No gross deformities, bruising, or lacerations. No step offs on palpation. Tender to palpation of the superior right forehead. No edema.  Neck: No JVD, full range of motion Cardiovascular: Regular rate and rhythm, with no murmurs rubs or gallops Respiratory: Normal WOB  breathing, CTAB Gastrointestinal: No tenderness to palpation  MSK: Moves all extremities appropriately  Derm: No lacerations  Neuro: Sensation grossly intact, normal EOM.  Psych: Unable to complete mental status exam at this time  Labs:  CBC BMET  Recent Labs  Lab 10/31/23 1135 10/31/23 1205  WBC 11.8*  --   HGB 15.5* 16.7*  HCT 47.3* 49.0*  PLT 354  --    Recent Labs  Lab 10/31/23 1135 10/31/23 1205  NA 138 137  K 4.0 4.0  CL 102 103  CO2 22  --    BUN 14 20  CREATININE 0.62 0.40*  GLUCOSE 179* 177*  CALCIUM  9.5  --     Pertinent additional labs   Latest Reference Range & Units 10/31/23 11:36  Amphetamines NONE DETECTED  NONE DETECTED  Barbiturates NONE DETECTED  NONE DETECTED  Benzodiazepines NONE DETECTED  NONE DETECTED  Opiates NONE DETECTED  NONE DETECTED  COCAINE NONE DETECTED  POSITIVE !  Tetrahydrocannabinol NONE DETECTED  POSITIVE !  !: Data is abnormal    EKG: sinus tachycardia.  No evidence of prolonged Qtc interval or ST segment elevation. Limited by motion artifact.   Imaging Studies Performed: Pending CT head and CXR.   Omar Bibber, DO 10/31/2023, 4:06 PM

## 2023-10-31 NOTE — ED Provider Notes (Signed)
 [place] EMERGENCY DEPARTMENT AT [facility] Provider Note   CSN: [id number] Arrival date & time: 10/31/23  1132     History  Chief Complaint  Patient presents with   Hypoglycemia    Robin Montgomery is a 63 y.o. female.  With a history of insulin -dependent type 2 diabetes and polysubstance use who presents to the ED for hypoglycemia.  Patient reports that she was unable to see her medications well this morning and may have taken too much insulin .  Upon EMS arrival her fingerstick blood glucose was 31.  She was noted to be significantly confused at that time.  Mental status improved after IV dextrose administered by EMS.  Patient denies SI HI.  Her only systemic complaint at this time is that she is cold.  She admits to drinking alcohol, smoking crack cocaine and smoking marijuana last night   Hypoglycemia      Home Medications Prior to Admission medications   Medication Sig Start Date End Date Taking? Authorizing Provider  Accu-Chek Softclix Lancets lancets Use as instructed to check blood sugar twice daily. E11.65 11/22/22   Newlin, Enobong, MD  acetaminophen  (TYLENOL ) 500 MG tablet Take 1 tablet (500 mg total) by mouth every 6 (six) hours as needed. Patient taking differently: Take 500 mg by mouth every 6 (six) hours as needed for moderate pain. 08/19/19   Jhonnie Mosher, PA-C  albuterol  (VENTOLIN  HFA) 108 (90 Base) MCG/ACT inhaler Inhale 1-2 puffs into the lungs every 6 (six) hours as needed for wheezing or shortness of breath. 11/19/22   Ozell Blunt, MD  ARIPiprazole  (ABILIFY ) 10 MG tablet TAKE ONE TABLET BY MOUTH EVERYDAY AT BEDTIME Patient not taking: Reported on 11/16/2022 02/14/22   Arlyne Bering, NP  atorvastatin  (LIPITOR) 40 MG tablet TAKE ONE TABLET BY MOUTH ONCE DAILY FOR CHOLESTEROL 11/19/22   Ozell Blunt, MD  AUSTEDO  12 MG TABS TAKE THREE TABLETS BY MOUTH ONCE DAILY Patient not taking: Reported on 11/16/2022 02/14/22   Arlyne Bering, NP  Blood  Glucose Monitoring Suppl (ACCU-CHEK GUIDE) w/Device KIT Use as instructed to check blood sugar twice daily. E11.65 11/22/22   Newlin, Enobong, MD  Blood Pressure Monitor DEVI Please provide patient with insurance approved blood pressure monitor. Patient not taking: Reported on 02/28/2022 01/26/21   Fleming, Zelda W, NP  budesonide -formoterol  (SYMBICORT ) 160-4.5 MCG/ACT inhaler Inhale 2 puffs into the lungs 2 (two) times daily. 11/19/22   Ozell Blunt, MD  empagliflozin  (JARDIANCE ) 10 MG TABS tablet Take 1 tablet (10 mg total) by mouth daily before breakfast. 11/19/22   Ozell Blunt, MD  glucose blood (ACCU-CHEK GUIDE) test strip Use as instructed to check blood sugar twice daily. E11.65Use as instructed 11/22/22   Newlin, Enobong, MD  insulin  detemir (LEVEMIR  FLEXTOUCH) 100 UNIT/ML FlexPen Inject 30 Units into the skin 2 (two) times daily. For diabetes Patient not taking: Reported on 11/16/2022 02/28/22   Fleming, Zelda W, NP  Insulin  Pen Needle (TECHLITE PEN NEEDLES) 31G X 5 MM MISC use as instructed.inject into the skin once nightly 01/28/21   Fleming, Zelda W, NP  losartan  (COZAAR ) 25 MG tablet Take 1 tablet (25 mg total) by mouth daily. 11/20/22   Ozell Blunt, MD  metFORMIN  (GLUCOPHAGE -XR) 500 MG 24 hr tablet Take 1 tablet (500 mg total) by mouth daily. 11/19/22   Ozell Blunt, MD  Misc. Devices (OFFSET CANE) MISC Please provide patient with single cane. M70.62 01/26/21   Fleming, Zelda W, NP  omeprazole  (PRILOSEC) 40 MG capsule Take 1 capsule (40 mg total) by mouth daily. 11/19/22   Ozell Blunt, MD  thiamine  (VITAMIN B-1) 100 MG tablet Take 1 tablet (100 mg total) by mouth daily. 11/20/22   Ozell Blunt, MD  traZODone  (DESYREL ) 50 MG tablet TAKE 1/2 TABLET BY MOUTH EVERYDAY AT BEDTIME FOR SLEEP Patient not taking: Reported on 11/16/2022 07/20/22   Newlin, Enobong, MD      Allergies    Aspirin    Review of Systems   Review of Systems  Physical Exam Updated Vital Signs BP (!) 156/69   Pulse 74    Temp (!) 95.4 F (35.2 C) (Oral)   Resp 18   Ht 5\' 6"  (1.676 m)   Wt 65 kg   SpO2 100%   BMI 23.13 kg/m  Physical Exam Vitals and nursing note reviewed.  HENT:     Head: Normocephalic and atraumatic.  Eyes:     Pupils: Pupils are equal, round, and reactive to light.  Cardiovascular:     Rate and Rhythm: Normal rate and regular rhythm.  Pulmonary:     Effort: Pulmonary effort is normal.     Breath sounds: Normal breath sounds.  Abdominal:     Palpations: Abdomen is soft.     Tenderness: There is no abdominal tenderness.  Skin:    General: Skin is warm and dry.  Neurological:     General: No focal deficit present.     Mental Status: She is alert and oriented to person, place, and time.     Sensory: No sensory deficit.     Motor: No weakness.  Psychiatric:        Mood and Affect: Mood normal.     ED Results / Procedures / Treatments   Labs (all labs ordered are listed, but only abnormal results are displayed) Labs Reviewed  COMPREHENSIVE METABOLIC PANEL WITH GFR - Abnormal; Notable for the following components:      Result Value   Glucose, Bld 179 (*)    All other components within normal limits  CBC WITH DIFFERENTIAL/PLATELET - Abnormal; Notable for the following components:   WBC 11.8 (*)    Hemoglobin 15.5 (*)    HCT 47.3 (*)    Neutro Abs 9.3 (*)    All other components within normal limits  I-STAT CHEM 8, ED - Abnormal; Notable for the following components:   Creatinine, Ser 0.40 (*)    Glucose, Bld 177 (*)    Hemoglobin 16.7 (*)    HCT 49.0 (*)    All other components within normal limits  CBG MONITORING, ED - Abnormal; Notable for the following components:   Glucose-Capillary 33 (*)    All other components within normal limits  CBG MONITORING, ED - Abnormal; Notable for the following components:   Glucose-Capillary 180 (*)    All other components within normal limits  ETHANOL  URINALYSIS, W/ REFLEX TO CULTURE (INFECTION SUSPECTED)  RAPID URINE DRUG  SCREEN, HOSP PERFORMED  ACETAMINOPHEN  LEVEL  SALICYLATE LEVEL    EKG EKG Interpretation Date/Time:  Tuesday Oct 31 2023 11:39:18 EDT Ventricular Rate:  77 PR Interval:  124 QRS Duration:  109 QT Interval:  428 QTC Calculation: 485 R Axis:   73  Text Interpretation: Sinus rhythm Consider right atrial enlargement Motion artifact Confirmed by Rafael Bun [phone]) on 10/31/2023 1:30:51 PM  Radiology No results found.  Procedures Procedures    Medications Ordered in ED Medications  dextrose 50 % solution 50  mL (50 mLs Intravenous Given 10/31/23 1249)    ED Course/ Medical Decision Making/ A&P Clinical Course as of 10/31/23 1331  Tue Oct 31, 2023  1329 Patient had another episode of hypoglycemia in the 30s here.  Improvement after an amp of D50.  She was able to drink some juice and we will see if she can eat as well.  It remains unclear exactly how much insulin  she took or if it was long-acting or short acting.  For this reason she would benefit from close observation overnight.  She will require full psychiatric evaluation if there remains a concern for SI from either the patient or her family members.  Discussed with admitting hospitalist who accepts patient for admission [MP]    Clinical Course User Index [MP] Sallyanne Creamer, DO                                 Medical Decision Making 63 year old woman with history as above presenting given concern for altered mental status hypoglycemia.  Patient reports that she missed dosed her insulin  earlier this morning.  Family had voiced concern for potential self-harm on scene to EMS patient denies this adamantly here.  Improvement in her mental status after dextrose from EMS.  Will continue to monitor blood sugars here.  Hypothermic, will provide rewarming measures.  Slightly hypertensive.  Will obtain metabolic and toxicologic workup and continue to monitor.  If she is able to medically clear she may require TTS evaluation given  concern for potential SI  Amount and/or Complexity of Data Reviewed Labs: ordered.  Risk Prescription drug management. Decision regarding hospitalization.           Final Clinical Impression(s) / ED Diagnoses Final diagnoses:  Hypoglycemia due to insulin   Confusion    Rx / DC Orders ED Discharge Orders     None         Sallyanne Creamer, DO 10/31/23 1331

## 2023-10-31 NOTE — Assessment & Plan Note (Signed)
 Suspected due to hypoglycemia, but given poor history from patient pursuing other workup for possible stroke, head injury, other medication misadventure, and infection as noted above. Was found down so will evaluate for possible rhabdomyolysis as well. - EKG - CT head w/o contrast - Acetaminophen  level - Salicylate level - Ethanol - VBG - CK - TSH - Fall precautions  - Delirium precautions  - PT/OT eval  - Tylenol  1 g Q6H PRN

## 2023-10-31 NOTE — ED Notes (Signed)
 MD notified of CBG of 33, dextrose 50% ordered.

## 2023-10-31 NOTE — ED Notes (Signed)
 CCMD contacted to change pt to room 46.

## 2023-10-31 NOTE — Discharge Instructions (Addendum)
 Dear Robin Montgomery,  Thank you for letting us  participate in your care. You were hospitalized for intentional overdose on insulin  and diagnosed with Hypoglycemia. You were treated with glucose monitoring and changing diabetes medications to oral agents.    POST-HOSPITAL & CARE INSTRUCTIONS Take Robin Montgomery new diabetes medications as described. Talk with your primary care doctor about the best options for you.  Go to your follow up appointments (listed below)   DOCTOR'S APPOINTMENT   No future appointments.  Follow-up Information     Nida Barrow, Clinic Follow up on 11/15/2023.   Why: 930 - ARRIVE BY 915 :-)                Take care and be well!  Family Medicine Teaching Service Inpatient Team [place]  [facility]  [address] [place], [place] [address] [phone]                                     Outpatient Substance Abuse  Treatment- uninsured  Narcotics Anonymous 24-HOUR HELPLINE Pre-recorded for Meeting Schedules [facility] [phone]  [email or web address] ALCOHOLICS ANONYMOUS  [place]   Answering Service [phone] Please Note: All [place] Meetings are Non-smoking [email or web address]  Alcohol and Drug Services -  Insurance: Medicaid /State funding/private insurance Methadone, suboxone/Intensive outpatient  [place]   [phone] Fax: [phone] [address] [place], [place], [address] [place] [phone] Fax: [phone]    [address] [place], [place], [address] ([address] [place], [place], [place], [place], [place], [place], [place], [place]) [facility] [email or web address] Accepts State funding/Medicaid Transitional housing, Intensive Outpatient Treatment, Outpatient treatment, Veterans Services  Phone: [phone] Fax: [phone] Address: [address] [place] [address]   [employer] [facility] (http://[email or web address].info/) Insurance: Medicaid Case Management,  Administrator, arts, Medication Management, Outpatient Therapy, Psychosocial Rehabilitation, Substance Abuse Intensive Outpatient  Phone: [phone] Fax: [phone] [address], [place], [place], [address]  [facility] Medicaid, most private insurance providers Types of Program: Individual/Group Therapy, Substance Abuse Treatment  Phone: [place] [phone] Fax: [phone] [address], [place], [place], [address] [place] [phone] [address], [address] [place], [place], [address]  [facility]  Medicaid Types of Program: SAIOP  Phone: [phone] Fax: [phone] [address] [place], [place], [place], [address] [facility] Medicaid/state funds Crisis line [phone] [place] [phone] [place] [phone] [place] [phone]  [facility] [address];  [place], [place] [address] [phone]  Substance Abuse Intensive Outpatient Program [facility] [address] [place], [place] [address] [phone]- Substance abuse treatment  Successful Transitions  Insurance: [facility], [address], sliding scale Types of Program: substance abuse treatment, transportation assistance Phone: [phone] Fax: [phone] Address: [address], [place] [address] [facility] (Trend[employer]ap[employer]ch) Insurance: UHC, [place], [place], [place] Program: addiction counseling, detoxification,  Phone: [phone]  Fax: [phone] Address: [address], [place] [address]  [facility] (statewide facilities/programs) [address] (Medicaid/state funds) [place], [place] [address]                      http://[email or web address]/ [phone] Jayson Michael- [phone] [place]- [phone] [facility] [place] (2 Locations) (Medicaid/state funds) --[address]  [address]  walk in  8:30-12 and 1-2:30 [place], [id number]   [facility]- [phone] --[address] [place], [place] [address]  FA-213 [phone] walk in 8:30-12 and 2-3:30  [facility] state funds/medicaid [address] [place], [place] [address] [phone] [facility] (Suboxone clinic) Medicaid/state funds  [address] [place], [place] [address] [phone]   [facility]  [address] [place], [place] [address]  [phone] (24 hours) [place]- [address] [place], [place] [address]  [phone] (24 hours) [place]- [address] Alene Husk [phone] [place]- [address] [place] [phone] [place] Clapper [address] [place] [phone] [facility]- Medicaid and state funds  [place]- [address] [place], [place] [address] [phone] (24 hours) [place]- [address] [place], [place] [address] [phone] [facility]- [address] [place], [place] [address] [phone] (24 hours) [place] [address] [place], [place]  [address] [phone] [place]- [address] [place] [phone]

## 2023-11-01 DIAGNOSIS — Z789 Other specified health status: Secondary | ICD-10-CM

## 2023-11-01 DIAGNOSIS — F1590 Other stimulant use, unspecified, uncomplicated: Secondary | ICD-10-CM | POA: Diagnosis present

## 2023-11-01 DIAGNOSIS — Z720 Tobacco use: Secondary | ICD-10-CM

## 2023-11-01 DIAGNOSIS — E162 Hypoglycemia, unspecified: Secondary | ICD-10-CM | POA: Diagnosis not present

## 2023-11-01 HISTORY — DX: Tobacco use: Z72.0

## 2023-11-01 LAB — CBC
HCT: 45.4 % (ref 36.0–46.0)
Hemoglobin: 15.4 g/dL — ABNORMAL HIGH (ref 12.0–15.0)
MCH: 31.8 pg (ref 26.0–34.0)
MCHC: 33.9 g/dL (ref 30.0–36.0)
MCV: 93.8 fL (ref 80.0–100.0)
Platelets: 344 10*3/uL (ref 150–400)
RBC: 4.84 MIL/uL (ref 3.87–5.11)
RDW: 13.6 % (ref 11.5–15.5)
WBC: 10 10*3/uL (ref 4.0–10.5)
nRBC: 0 % (ref 0.0–0.2)

## 2023-11-01 LAB — GLUCOSE, CAPILLARY
Glucose-Capillary: 135 mg/dL — ABNORMAL HIGH (ref 70–99)
Glucose-Capillary: 259 mg/dL — ABNORMAL HIGH (ref 70–99)
Glucose-Capillary: 330 mg/dL — ABNORMAL HIGH (ref 70–99)
Glucose-Capillary: 318 mg/dL — ABNORMAL HIGH (ref 70–99)
Glucose-Capillary: 251 mg/dL — ABNORMAL HIGH (ref 70–99)
Glucose-Capillary: 253 mg/dL — ABNORMAL HIGH (ref 70–99)

## 2023-11-01 LAB — BASIC METABOLIC PANEL WITH GFR
Anion gap: 9 (ref 5–15)
BUN: 19 mg/dL (ref 8–23)
CO2: 22 mmol/L (ref 22–32)
Calcium: 9.4 mg/dL (ref 8.9–10.3)
Chloride: 104 mmol/L (ref 98–111)
Creatinine, Ser: 0.7 mg/dL (ref 0.44–1.00)
GFR, Estimated: >60 mL/min (ref >60)
Glucose, Bld: 149 mg/dL — ABNORMAL HIGH (ref 70–99)
Potassium: 4.1 mmol/L (ref 3.5–5.1)
Sodium: 135 mmol/L (ref 135–145)

## 2023-11-01 MED ORDER — METFORMIN HCL ER 500 MG PO TB24
500.0000 mg | ORAL_TABLET | Freq: Every day | ORAL | Status: DC
  Filled 2023-11-01 (×2): qty 1

## 2023-11-01 MED ORDER — NICOTINE 21 MG/24HR TD PT24
21.0000 mg | MEDICATED_PATCH | Freq: Every day | TRANSDERMAL | Status: DC
  Administered 2023-11-01 – 2023-11-07 (×6): 21 mg via TRANSDERMAL
  Filled 2023-11-01 (×7): qty 1

## 2023-11-01 MED ORDER — SERTRALINE HCL 50 MG PO TABS
50.0000 mg | ORAL_TABLET | Freq: Every day | ORAL | Status: DC
  Administered 2023-11-01 – 2023-11-07 (×7): 50 mg via ORAL
  Filled 2023-11-01 (×7): qty 1

## 2023-11-01 MED ORDER — RISPERIDONE 0.5 MG PO TABS
0.5000 mg | ORAL_TABLET | Freq: Every day | ORAL | Status: DC
  Administered 2023-11-01 – 2023-11-06 (×6): 0.5 mg via ORAL
  Filled 2023-11-01 (×7): qty 1

## 2023-11-01 MED ORDER — INSULIN ASPART 100 UNIT/ML IJ SOLN
0.0000 [IU] | Freq: Three times a day (TID) | INTRAMUSCULAR | Status: DC
  Administered 2023-11-01: 7 [IU] via SUBCUTANEOUS
  Administered 2023-11-01: 5 [IU] via SUBCUTANEOUS
  Administered 2023-11-02: 2 [IU] via SUBCUTANEOUS
  Administered 2023-11-02 (×2): 3 [IU] via SUBCUTANEOUS
  Administered 2023-11-03: 5 [IU] via SUBCUTANEOUS
  Administered 2023-11-03 – 2023-11-04 (×3): 3 [IU] via SUBCUTANEOUS
  Administered 2023-11-04: 1 [IU] via SUBCUTANEOUS
  Administered 2023-11-04: 5 [IU] via SUBCUTANEOUS
  Administered 2023-11-05 – 2023-11-06 (×2): 3 [IU] via SUBCUTANEOUS
  Administered 2023-11-06 (×2): 2 [IU] via SUBCUTANEOUS
  Administered 2023-11-07: 3 [IU] via SUBCUTANEOUS
  Administered 2023-11-07: 1 [IU] via SUBCUTANEOUS
  Administered 2023-11-07: 2 [IU] via SUBCUTANEOUS

## 2023-11-01 NOTE — Inpatient Diabetes Management (Addendum)
 Inpatient Diabetes Program Recommendations  AACE/ADA: New Consensus Statement on Inpatient Glycemic Control (2015)  Target Ranges:  Prepandial:   less than 140 mg/dL      Peak postprandial:   less than 180 mg/dL (1-2 hours)      Critically ill patients:  140 - 180 mg/dL   Lab Results  Component Value Date   GLUCAP 318 (H) 11/01/2023   HGBA1C 9.1 (H) 11/16/2022    Review of Glycemic Control  Diabetes history: DM2 Outpatient Diabetes medications: Levemir  30 units BID (not taking), Humalog 10 units TID (not taking), Metformin  500 mg daily Current orders for Inpatient glycemic control: Novolog  0-9 units correction scale TID, Metformin  500 mg daily  Inpatient Diabetes Program Recommendations:   Spoke with patient at the bedside. Patient was not very clear and could not tell me how much insulin  she takes at home. Patient states that she has a hard time affording the insulin . Patient states that she takes her insulin  about once per week; encouraged her to take it everyday.  Recommend starting a low dose of basal insulin : Semglee  20 units daily if blood sugars continue to be elevated.    Will continue to monitor blood sugars while in the hospital.  Nick Barman RN BSN CDE Diabetes Coordinator Pager: [phone]  8am-5pm

## 2023-11-01 NOTE — Assessment & Plan Note (Signed)
 smokes crack cocaine daily per H&P. Will provide rehab recommendation if pt does not go inpatient psychiatry.

## 2023-11-01 NOTE — Assessment & Plan Note (Addendum)
 See plan under hypoglycemia. Hold metformin .

## 2023-11-01 NOTE — Assessment & Plan Note (Signed)
 Pt requesting NRT while in hospital. Did not voice if she is interested in quitting or quantify use but said she uses highest dose patch in past.  - Start nicotine  patch 21 mg once daily for NRT

## 2023-11-01 NOTE — Evaluation (Signed)
 Occupational Therapy Evaluation Patient Details Name: Robin Montgomery MRN: [id number] DOB: 02-Jul-1960 Today's Date: 11/01/2023   History of Present Illness   Pt is a 63 yr old female presented 10/31/23 due to AMS and hypoglycemia. Pt reports intentionally overdosing on her insulin  5/27 in an attempt to end her life. She also reports being clean/sober for 2 years however she had alcohol and did crack/cocaine yesterday as well. Reports not knowing her trigger. PMH: T2DM, polysubstance abuse, major depressive disorder, HTN     Clinical Impressions Pt reports about being in a "roaming house" and did not require any DME. Pt at this time after encouragement agreed to session and completed light sponge bath at sink with sitting to standing post set up of materials and CGA when standing due to some shaking. Pt then want to go back to bed. Acute Occupational Therapy will continue to follow to maximize participation into ADLS.      If plan is discharge home, recommend the following:   A little help with walking and/or transfers;A little help with bathing/dressing/bathroom;Assistance with cooking/housework;Assist for transportation     Functional Status Assessment   Patient has had a recent decline in their functional status and demonstrates the ability to make significant improvements in function in a reasonable and predictable amount of time.     Equipment Recommendations   None recommended by OT     Recommendations for Other Services         Precautions/Restrictions   Precautions Precautions: Fall Precaution/Restrictions Comments: pt shaky, unsteady Restrictions Weight Bearing Restrictions Per Provider Order: No     Mobility Bed Mobility Overal bed mobility: Modified Independent                  Transfers Overall transfer level: Needs assistance Equipment used: None Transfers: Sit to/from Stand Sit to Stand: Contact guard assist           General  transfer comment: CGA as pt sometimes unaware of posture and shakiness      Balance Overall balance assessment: Mild deficits observed, not formally tested                                         ADL either performed or assessed with clinical judgement   ADL Overall ADL's : Needs assistance/impaired Eating/Feeding: Independent   Grooming: Wash/dry hands;Wash/dry face;Contact guard assist;Sitting;Standing   Upper Body Bathing: Set up;Sitting   Lower Body Bathing: Contact guard assist;Sitting/lateral leans;Sit to/from stand   Upper Body Dressing : Set up;Sitting   Lower Body Dressing: Contact guard assist;Sitting/lateral leans;Sit to/from stand   Toilet Transfer: Contact guard assist   Toileting- Clothing Manipulation and Hygiene: Contact guard assist;Sitting/lateral lean;Sit to/from stand       Functional mobility during ADLs: Contact guard assist       Vision Baseline Vision/History: 0 No visual deficits Ability to See in Adequate Light: 0 Adequate Patient Visual Report: No change from baseline Vision Assessment?: No apparent visual deficits     Perception         Praxis         Pertinent Vitals/Pain Pain Assessment Pain Assessment: No/denies pain     Extremity/Trunk Assessment Upper Extremity Assessment Upper Extremity Assessment: Overall WFL for tasks assessed;Generalized weakness   Lower Extremity Assessment Lower Extremity Assessment: Generalized weakness   Cervical / Trunk Assessment Cervical / Trunk Assessment: Normal   Communication Communication  Communication: Impaired Factors Affecting Communication: Reduced clarity of speech   Cognition Arousal: Alert Behavior During Therapy: Flat affect Cognition: Difficult to assess             OT - Cognition Comments: noted decrease in saftey awareness with tasks                 Following commands: Intact       Cueing  General Comments   Cueing Techniques: Verbal  cues  max hr 125   Exercises     Shoulder Instructions      Home Living Family/patient expects to be discharged to:: Private residence (roaming house) Living Arrangements: Alone Available Help at Discharge: Other (Comment) (none) Type of Home: House Home Access: Stairs to enter [employer] of Steps: 3 Entrance Stairs-Rails: Right Home Layout: One level     Bathroom Shower/Tub: Producer, television/film/video: Standard     Home Equipment: None          Prior Functioning/Environment Prior Level of Function : Independent/Modified Independent             Mobility Comments: reports not using AD, doesn't drive ADLs Comments: indep, reports "someone takes me to get groceries when it's time" reports having a microwave in her room and using that to cook    OT Problem List: Decreased strength;Decreased activity tolerance;Impaired balance (sitting and/or standing);Decreased knowledge of use of DME or AE   OT Treatment/Interventions: Self-care/ADL training;Therapeutic activities;Patient/family education;Balance training      OT Goals(Current goals can be found in the care plan section)   Acute Rehab OT Goals Patient Stated Goal: to get a shower OT Goal Formulation: With patient Time For Goal Achievement: 11/15/23 Potential to Achieve Goals: Good   OT Frequency:  Min 2X/week    Co-evaluation              AM-PAC OT "6 Clicks" Daily Activity     Outcome Measure Help from another person eating meals?: None Help from another person taking care of personal grooming?: None Help from another person toileting, which includes using toliet, bedpan, or urinal?: A Little Help from another person bathing (including washing, rinsing, drying)?: A Little Help from another person to put on and taking off regular upper body clothing?: None Help from another person to put on and taking off regular lower body clothing?: A Little 6 Click Score: 21   End of Session  Equipment Utilized During Treatment: Gait belt Nurse Communication: Mobility status  Activity Tolerance: Patient tolerated treatment well Patient left: in bed;with call bell/phone within reach;with bed alarm set  OT Visit Diagnosis: Unsteadiness on feet (R26.81);Other abnormalities of gait and mobility (R26.89);Muscle weakness (generalized) (M62.81)                Time: 4098-1191 OT Time Calculation (min): 25 min Charges:  OT General Charges $OT Visit: 1 Visit OT Evaluation $OT Eval Low Complexity: 1 Low OT Treatments $Self Care/Home Management : 8-22 mins  Erving Heather OTR/L  Acute Rehab Services  [phone] office number   Stevphen Elders 11/01/2023, 11:19 AM

## 2023-11-01 NOTE — Assessment & Plan Note (Addendum)
 Levemir  should be metabolized. CBGs stable and will stop every hour and switched to standard CBG checks. Will stop long acting insulin  in favor or oral medications given hx of overdose and likely repeat overdose today.  - Change CBGs to with meals and at bedtime - Discontinue home Levemir  - Start SSI sensitive with CBGs - Start metformin  XR 500 mg once daily  - Follow up A1c

## 2023-11-01 NOTE — Assessment & Plan Note (Addendum)
 1 pint of vodka daily, recently in rehab, will monitor for alcohol withdrawal. CIWA 0 on my assessment this morning. Unsteadiness per PT may represent Wernicke.  - CIWAS Q6H  - Continue high dose thiamine  three time daily for 9 doses  - Folic acid 1 mg daily  - Multivitamin daily

## 2023-11-01 NOTE — Evaluation (Signed)
 Physical Therapy Evaluation Patient Details Name: Robin Montgomery MRN: [id number] DOB: [birth year] Today's Date: 11/01/2023  History of Present Illness  Pt is a 63 yr old female presented 10/31/23 due to AMS and hypoglycemia. Pt reports intentionally overdosing on her insulin  5/27 in an attempt to end her life. She also reports being clean/sober for 2 years however she had alcohol and did crack/cocaine yesterday as well. Reports not knowing her trigger. PMH: T2DM, polysubstance abuse, major depressive disorder, HTN   Clinical Impression  Pt admitted with above. PTA pt lived in a "roaming house" and was indep without AD. Pt not motivated with known depression and reports "sleeping all day." Pt shaky with mobility this date, suspect from recent overdose and use of alcohol and crack/cocaine. Anticipate pt to progress well functionally and be able to d/c back to roaming house without PT follow up. Acute PT to cont to follow and assess mobility and d/c recommendations.        If plan is discharge home, recommend the following: A little help with walking and/or transfers;A little help with bathing/dressing/bathroom;Assist for transportation;Help with stairs or ramp for entrance   Can travel by private vehicle        Equipment Recommendations None recommended by PT  Recommendations for Other Services       Functional Status Assessment Patient has had a recent decline in their functional status and demonstrates the ability to make significant improvements in function in a reasonable and predictable amount of time.     Precautions / Restrictions Precautions Precautions: Fall Precaution/Restrictions Comments: pt shaky, unsteady Restrictions Weight Bearing Restrictions Per Provider Order: No      Mobility  Bed Mobility Overal bed mobility: Modified Independent             General bed mobility comments: HOB slightly elevated, incresaed time, able to manage covers     Transfers Overall transfer level: Needs assistance Equipment used: None Transfers: Sit to/from Stand Sit to Stand: Contact guard assist           General transfer comment: contact guard for stability due to shakiness. Pt with wide base of support and noted lateral sway    Ambulation/Gait Ambulation/Gait assistance: Contact guard assist Gait Distance (Feet): 150 Feet Assistive device: None Gait Pattern/deviations: Step-through pattern, Decreased stride length, Wide base of support, Shuffle Gait velocity: dec Gait velocity interpretation: <1.31 ft/sec, indicative of household ambulator   General Gait Details: pt with wide base of support, shaky, and unsteady. Pt reports "this is normal" suspect shaky from recent use of alcohol and crack/cocaine  Stairs            Wheelchair Mobility     Tilt Bed    Modified Rankin (Stroke Patients Only)       Balance Overall balance assessment: Mild deficits observed, not formally tested (pt stood at sink to wash hands, wide base of support, lean on sink with trunk)                                           Pertinent Vitals/Pain Pain Assessment Pain Assessment: No/denies pain    Home Living Family/patient expects to be discharged to:: Private residence ("roaming house") Living Arrangements: Alone Available Help at Discharge: Other (Comment) (none) Type of Home: House Home Access: Stairs to enter Entrance Stairs-Rails: Right Entrance Stairs-Number of Steps: 3   Home Layout: One level  Home Equipment: None      Prior Function Prior Level of Function : Independent/Modified Independent             Mobility Comments: reports not using AD, doesn't drive ADLs Comments: indep, reports "someone takes me to get groceries when it's time" reports having a microwave in her room and using that to cook     Extremity/Trunk Assessment   Upper Extremity Assessment Upper Extremity Assessment: Generalized  weakness    Lower Extremity Assessment Lower Extremity Assessment: Generalized weakness    Cervical / Trunk Assessment Cervical / Trunk Assessment: Normal  Communication   Communication Communication: Impaired Factors Affecting Communication: Reduced clarity of speech    Cognition Arousal: Alert Behavior During Therapy: Flat affect   PT - Cognitive impairments: No family/caregiver present to determine baseline                       PT - Cognition Comments: pt with known depression, flat affect, not motivated, reports sleeping all day, doesn't work Following commands: Occupational hygienist Techniques: Tactile cues, Verbal cues     General Comments General comments (skin integrity, edema, etc.): HR 110-114bpm. pt reports being unable to read menu due to not having glassess. PT assisted pt with ordering breakfast and lunch for today, pt assisted to bathroom, supervision for hygiene, wide base of support to stabilize self while pulling up underwear    Exercises     Assessment/Plan    PT Assessment Patient needs continued PT services  PT Problem List Decreased strength;Decreased range of motion;Decreased activity tolerance;Decreased balance;Decreased mobility       PT Treatment Interventions DME instruction;Gait training;Stair training;Functional mobility training;Therapeutic activities;Therapeutic exercise;Balance training    PT Goals (Current goals can be found in the Care Plan section)  Acute Rehab PT Goals Patient Stated Goal: didn't state PT Goal Formulation: With patient Time For Goal Achievement: 11/15/23 Potential to Achieve Goals: Good    Frequency Min 2X/week     Co-evaluation               AM-PAC PT "6 Clicks" Mobility  Outcome Measure Help needed turning from your back to your side while in a flat bed without using bedrails?: None Help needed moving from lying on your back to sitting on the side of a flat bed without using  bedrails?: None Help needed moving to and from a bed to a chair (including a wheelchair)?: None Help needed standing up from a chair using your arms (e.g., wheelchair or bedside chair)?: A Little Help needed to walk in hospital room?: A Little Help needed climbing 3-5 steps with a railing? : A Little 6 Click Score: 21    End of Session Equipment Utilized During Treatment: Gait belt Activity Tolerance: Patient tolerated treatment well Patient left: in bed;with call bell/phone within reach;with bed alarm set Nurse Communication: Mobility status (took pt to bathroom) PT Visit Diagnosis: Unsteadiness on feet (R26.81)    Time: 4010-2725 PT Time Calculation (min) (ACUTE ONLY): 17 min   Charges:   PT Evaluation $PT Eval Low Complexity: 1 Low   PT General Charges $$ ACUTE PT VISIT: 1 Visit         Renaee Caro, PT, DPT Acute Rehabilitation Services Secure chat preferred Office #: [phone]   Jenna Moan 11/01/2023, 8:37 AM
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
## 2023-11-01 NOTE — Assessment & Plan Note (Deleted)
 HTN: *** lisinopril , losartan  GERD: *** omeprazole  Insomnia: hold trazadone due to AMS Tardive dyskinesia? Hold austedo  *** Asthma: restart albuterol  with wheezing/dyspnea

## 2023-11-01 NOTE — Assessment & Plan Note (Addendum)
 Likely from hypoglycemia.  Current mental status improved to baseline.  Toxicmetabolic workup negative. Unsteady per PT but no other concerns. OT pending - Fall precautions  - Delirium precautions  - PT/OT eval  - Tylenol  1 g Q6H PRN

## 2023-11-01 NOTE — Assessment & Plan Note (Addendum)
 Chronic problem, history of suicide attempt by overdose on cocaine and stopping hypertensive and insulin  in 2015. Psychiatry consulted for assessment and recommendations on medication and dispo. Pt is not trying to leave hospital at this time.  - follow up psychiatry recommendations - continue 1:1 telemonitoring

## 2023-11-01 NOTE — Progress Notes (Signed)
 Daily Progress Note Intern Pager: [phone]  Patient name: Robin Montgomery Medical record number: [id number] Date of birth: 1961/03/28 Age: 63 y.o. Gender: female  Primary Care Provider: Collins Dean, NP Consultants: Psychiatry Code Status: Full  Pt Overview and Major Events to Date:  Robin Montgomery is a 63 y.o. female with history of insulin  dependent T2DM, polysubstance abuse, major depressive disorder, and hypertension presenting via EMS with AMS and hypoglycemia.   Assessment and Plan: Glucoses stable. Switching off long acting insulin  in favor of orals that can't be OD on. Will need 3 days of IV thamine given unsteady on PT assessment concerning for Wernicke with hx of alcohol use.  Assessment & Plan Hypoglycemia Levemir  should be metabolized. CBGs stable and will stop every hour and switched to standard CBG checks. Will stop long acting insulin  in favor or oral medications given hx of overdose and likely repeat overdose today.  - Change CBGs to with meals and at bedtime - Discontinue home Levemir  - Start SSI sensitive with CBGs - Start metformin  XR 500 mg once daily  - Follow up A1c Intentional overdose  MDD (major depressive disorder), recurrent severe, without psychosis (HCC) Chronic problem, history of suicide attempt by overdose on cocaine and stopping hypertensive and insulin  in 2015. Psychiatry consulted for assessment and recommendations on medication and dispo. Pt is not trying to leave hospital at this time.  - follow up psychiatry recommendations - continue 1:1 telemonitoring AMS (altered mental status) Likely from hypoglycemia.  Current mental status improved to baseline.  Toxicmetabolic workup negative. Unsteady per PT but no other concerns. OT pending - Fall precautions  - Delirium precautions  - PT/OT eval  - Tylenol  1 g Q6H PRN Alcohol use disorder 1 pint of vodka daily, recently in rehab, will monitor for alcohol withdrawal. CIWA 0 on my assessment  this morning. Unsteadiness per PT may represent Wernicke.  - CIWAS Q6H  - Continue high dose thiamine  three time daily for 9 doses  - Folic acid 1 mg daily  - Multivitamin daily  Nicotine  use Pt requesting NRT while in hospital. Did not voice if she is interested in quitting or quantify use but said she uses highest dose patch in past.  - Start nicotine  patch 21 mg once daily for NRT Stimulant use disorder (cocaine) smokes crack cocaine daily per H&P. Will provide rehab recommendation if pt does not go inpatient psychiatry.  Diabetes mellitus type 2, insulin  dependent (HCC) See plan under hypoglycemia. Hold metformin .     FEN/GI: diet heart, peripheral IV x1 PPx: lovenox  40 Dispo:home v inpatient psychiatry pending psychiatric assessment and 9 doses of high dose thiamine .   Subjective:  Pt reports that she is her baseline. Denies any symptoms including nausea, vomiting, dizziness, headache, constipation, diarrhea, abdominal pain. Discussed that psych will see her in hospital and likely she will go to the behavioral health hospital and she was agreeable. She reports that she will not try to leave and is okay with getting mental health treatment.   Objective: Temp:  [95.4 F (35.2 C)-99.2 F (37.3 C)] 98.1 F (36.7 C) (05/28 1050) Pulse Rate:  [72-109] 90 (05/28 1050) Resp:  [14-32] 19 (05/28 1050) BP: (103-172)/(50-109) 109/64 (05/28 1050) SpO2:  [98 %-100 %] 98 % (05/27 2340) Weight:  [65 kg] 65 kg (05/27 1138) Physical Exam: General: alert, cooperative Cardiovascular: normal rate and rhythm. Normal heart sounds.  Respiratory: normal effort. Clear to auscultation bilaterally.  Abdomen: soft and nontender.  Extremities:  no lower extremity edema or wounds.  Neuro: alert and oriented to person, time, and place. Attention and memory intact. Excetive fuctioning intact in multiple domains. Follow simple/complex with OT without difficulty. No obvious focal deficits. No tremors.   Psych: affect depressed. Behavioral appropriate to situation. Well groomed. Linear thoughts process and no evidence of delusion, paranoia,etc. Defer suicide risk assessment to psychiatry.   Laboratory: Most recent CBC Lab Results  Component Value Date   WBC 10.0 11/01/2023   HGB 15.4 (H) 11/01/2023   HCT 45.4 11/01/2023   MCV 93.8 11/01/2023   PLT 344 11/01/2023   Most recent BMP    Latest Ref Rng & Units 11/01/2023    2:54 AM  BMP  Glucose 70 - 99 mg/dL 528   BUN 8 - 23 mg/dL 19   Creatinine 4.13 - 1.00 mg/dL 2.44   Sodium 010 - 272 mmol/L 135   Potassium 3.5 - 5.1 mmol/L 4.1   Chloride 98 - 111 mmol/L 104   CO2 22 - 32 mmol/L 22   Calcium  8.9 - 10.3 mg/dL 9.4    Glu last 24 hours: 135-259   [name], MD 11/01/2023, 11:08 AM  PGY-1, [facility] FPTS Intern pager: [phone], text pages welcome Secure chat group [facility] Teaching Service

## 2023-11-02 ENCOUNTER — Other Ambulatory Visit (HOSPITAL_COMMUNITY): Payer: Self-pay

## 2023-11-02 DIAGNOSIS — E512 Wernicke's encephalopathy: Secondary | ICD-10-CM | POA: Diagnosis present

## 2023-11-02 LAB — GLUCOSE, CAPILLARY
Glucose-Capillary: 238 mg/dL — ABNORMAL HIGH (ref 70–99)
Glucose-Capillary: 242 mg/dL — ABNORMAL HIGH (ref 70–99)
Glucose-Capillary: 215 mg/dL — ABNORMAL HIGH (ref 70–99)
Glucose-Capillary: 309 mg/dL — ABNORMAL HIGH (ref 70–99)

## 2023-11-02 LAB — BASIC METABOLIC PANEL WITH GFR
Anion gap: 6 (ref 5–15)
BUN: 18 mg/dL (ref 8–23)
CO2: 25 mmol/L (ref 22–32)
Calcium: 9.4 mg/dL (ref 8.9–10.3)
Chloride: 103 mmol/L (ref 98–111)
Creatinine, Ser: 0.69 mg/dL (ref 0.44–1.00)
GFR, Estimated: >60 mL/min (ref >60)
Glucose, Bld: 264 mg/dL — ABNORMAL HIGH (ref 70–99)
Potassium: 4.3 mmol/L (ref 3.5–5.1)
Sodium: 134 mmol/L — ABNORMAL LOW (ref 135–145)

## 2023-11-02 LAB — LIPID PANEL
Cholesterol: 231 mg/dL — ABNORMAL HIGH (ref 0–200)
HDL: 66 mg/dL (ref >40)
LDL Cholesterol: 141 mg/dL — ABNORMAL HIGH (ref 0–99)
Total CHOL/HDL Ratio: 3.5 ratio
Triglycerides: 118 mg/dL (ref <150)
VLDL: 24 mg/dL (ref 0–40)

## 2023-11-02 LAB — HEMOGLOBIN A1C
Hgb A1c MFr Bld: 9.5 % — ABNORMAL HIGH (ref 4.8–5.6)
Mean Plasma Glucose: 225.95 mg/dL

## 2023-11-02 MED ORDER — THIAMINE HCL 100 MG/ML IJ SOLN
500.0000 mg | Freq: Three times a day (TID) | INTRAVENOUS | Status: AC
  Administered 2023-11-02 – 2023-11-03 (×3): 500 mg via INTRAVENOUS
  Filled 2023-11-02 (×3): qty 5

## 2023-11-02 NOTE — Inpatient Diabetes Management (Addendum)
 Inpatient Diabetes Program Recommendations  AACE/ADA: New Consensus Statement on Inpatient Glycemic Control (2015)  Target Ranges:  Prepandial:   less than 140 mg/dL      Peak postprandial:   less than 180 mg/dL (1-2 hours)      Critically ill patients:  140 - 180 mg/dL   Lab Results  Component Value Date   GLUCAP 238 (H) 11/02/2023   HGBA1C 9.5 (H) 11/02/2023    Review of Glycemic Control  Latest Reference Range & Units 11/01/23 10:51 11/01/23 15:46 11/01/23 21:27 11/02/23 06:16  Glucose-Capillary 70 - 99 mg/dL 161 (H) 096 (H) 045 (H) 238 (H)  (H): Data is abnormally high  Diabetes history: DM2 Outpatient Diabetes medications: Levemir  30 units BID (not taking), Humalog 10 units TID (not taking), Metformin  500 mg daily Current orders for Inpatient glycemic control: Novolog  0-9 units correction scale TID, Metformin  500 mg daily    Inpatient Diabetes Program Recommendations:    Please consider:  Semglee  20 units every day  Addendum@08 :51:  I was notified that patient attempted overdose with insulin .  Might consider:  Glipizide 5 mg QAM with breakfast Metformin  500 mg BID  Appears she may be admitted to [facility]; we can follow glucose trends and help with DC recommendations.  May want to avoid insulin  if possible.  She will need to make diet modifications and have close F/U with PCP.  Thank you, Hays Lipschutz, MSN, CDCES Diabetes Coordinator Inpatient Diabetes Program [phone] (team pager from 8a-5p)

## 2023-11-02 NOTE — TOC Initial Note (Addendum)
 Transition of Care [facility]) - Initial/Assessment Note    Patient Details  Name: Robin Montgomery MRN: [id number] Date of Birth: December 08, 1960  [facility] CM/SW Contact:    [name], LCSW Phone Number: 11/02/2023, 4:11 PM  Clinical Narrative:      CSW met with patient at bedside. CSW introduced self and explained role. Patient states she remains agreeable to inpatient rehab. Patient is hopeful for placement in the local area. Patient declined contacting family.  TOC will send out referral for [facility] once noted patient is medically stable and psych cleared.   Liddie Reel, MSW, LCSW Clinical Social Worker             Expected Discharge Plan: Psychiatric Hospital Barriers to Discharge: Continued Medical Work up (psych placement)   Patient Goals and CMS Choice            Expected Discharge Plan and Services In-house Referral: Clinical Social Work     Living arrangements for the past 2 months: Single Family Home                                      Prior Living Arrangements/Services Living arrangements for the past 2 months: Single Family Home Lives with:: Self Patient language and need for interpreter reviewed:: No        Need for Family Participation in Patient Care: Yes (Comment) Care giver support system in place?: Yes (comment)   Criminal Activity/Legal Involvement Pertinent to Current Situation/Hospitalization: No - Comment as needed  Activities of Daily Living      Permission Sought/Granted Permission sought to share information with : Family Supports Permission granted to share information with : Yes, Verbal Permission Granted  Share Information with NAME: [place]  Permission granted to share info w AGENCY: [facility]  Permission granted to share info w Relationship: cousin  Permission granted to share info w Contact Information: [phone]  Emotional Assessment Appearance:: Appears stated age Attitude/Demeanor/Rapport:  Engaged Affect (typically observed): Accepting, Pleasant Orientation: : Oriented to Self, Oriented to Place, Oriented to  Time, Oriented to Situation Alcohol / Substance Use: Not Applicable Psych Involvement: No (comment)  Admission diagnosis:  Confusion [R41.0] Hypoglycemia [E16.2] Hypoglycemia due to insulin  [E16.0, T38.3X5A] Patient Active Problem List   Diagnosis Date Noted   Wernicke encephalopathy 11/02/2023   Nicotine  use 11/01/2023   Stimulant use disorder (cocaine) 11/01/2023   Chronic health problem 11/01/2023   Acute cystitis 11/16/2022   Acute gastroenteritis 11/16/2022   Hypokalemia 11/16/2022   Hypophosphatemia 11/16/2022   Moderate protein malnutrition (HCC) 11/16/2022   Left medial knee pain 07/31/2016   Essential hypertension 11/17/2015   Alcohol abuse    Intentional overdose  MDD (major depressive disorder), recurrent severe, without psychosis (HCC) 02/12/2015   Substance induced mood disorder (HCC) 02/12/2015   Alcohol use disorder    Wrist pain, chronic 12/22/2014   Trochanteric bursitis of left hip 12/22/2014   Seasonal allergies 09/12/2014   Tobacco dependence 09/12/2014   Shortness of breath 09/12/2014   Diabetes mellitus type 2, insulin  dependent (HCC) 04/01/2014   Pain due to onychomycosis of toenail 04/01/2014   Cocaine abuse (HCC) 04/01/2014   PCP:  Collins Dean, NP Pharmacy:   [facility] [address] [place] [address] Phone: [phone] Fax: [phone]     Social Drivers of Health (SDOH) Social History: SDOH Screenings   Food Insecurity:  Patient Declined (11/01/2023)  Housing: Low Risk  (11/01/2023)  Transportation Needs: No Transportation Needs (11/01/2023)  Utilities: Not At Risk (11/01/2023)  Depression (PHQ2-9): Medium Risk (02/28/2022)  Financial Resource Strain: Medium Risk (01/19/2021)  Social Connections: Unknown (10/19/2021)   [facility] from [facility]  Stress: Stress  Concern Present (01/19/2021)  Tobacco Use: High Risk (10/31/2023)   SDOH Interventions:     Readmission Risk Interventions    11/19/2022    8:43 AM  Readmission Risk Prevention Plan  Transportation Screening Complete  PCP or Specialist Appt within 5-7 Days Complete  Home Care Screening Complete  Medication Review (RN CM) Complete

## 2023-11-02 NOTE — Assessment & Plan Note (Addendum)
 Stopped long acting insulin  due to intentional overdose x2 and plan to switch to oral agent w/o OD potential.  Reports that she does not want metformin  and has had very bad diarrhea in past in this medication. A1c 9s on 60 units insulin  so may require switching back to long acting insulin .  - Continue CBGs to with meals and at bedtime, sensitive SSI - Discontinue home Levemir  - discontinue metformin  - Will find new agent that is affordable, likely GLP-1

## 2023-11-02 NOTE — Assessment & Plan Note (Addendum)
 1 pint of vodka daily, recently in rehab, will monitor for alcohol withdrawal. Ethanol of 0 on admission and no alcohol withdrawal symptoms for 48 hours so will discontinue CIWAs. - discontinue CIWA  - Folic acid 1 mg daily  - Multivitamin daily

## 2023-11-02 NOTE — Progress Notes (Signed)
 Daily Progress Note Intern Pager: [phone]  Patient name: Robin Montgomery Medical record number: [id number] Date of birth: 1960-08-18 Age: 63 y.o. Gender: female  Primary Care Provider: Collins Dean, NP Consultants: Psychiatry Code Status: Full  Pt Overview and Major Events to Date:  Robin Montgomery is a 63 y.o. female with history of insulin  dependent T2DM, polysubstance abuse, major depressive disorder, and hypertension presenting via EMS with AMS and hypoglycemia.   Assessment and Plan: Patient will need continued high-dose thiamine  doses but will be medically stable tomorrow morning after last dose for inpatient psychiatry. Assessment & Plan Wernicke encephalopathy Unsteady gait on PT assessment, hx of significant alcohol use.  - Continue high dose thiamine  three time daily for 9 doses (last dose 5/30 0600) - Alcohol cessation per Alcohol Use Disorder below Intentional overdose  MDD (major depressive disorder), recurrent severe, without psychosis (HCC) Hx of SA on insulin . Concern for another intentional OD on insulin . Psych saw and recommended inpatient psychiatry, starting sertraline  50 daily for MDD, and risperdal 0.5 mg night for auditory hallucinations.  - transfer to inpatient psychiatry tomorrow morning pending placement - continue 1:1 telemonitoring - continue sertraline  50 mg once daily for MDD - continue risperdal 0.5 mg once at night for auditory hallucinations Hypoglycemia (Resolved: 11/02/2023) On admission from insulin  overdose. Resolved.  Diabetes mellitus type 2, insulin  dependent (HCC) Stopped long acting insulin  due to intentional overdose x2 and plan to switch to oral agent w/o OD potential.  Reports that she does not want metformin  and has had very bad diarrhea in past in this medication. A1c 9s on 60 units insulin  so may require switching back to long acting insulin .  - Continue CBGs to with meals and at bedtime, sensitive SSI - Discontinue home  Levemir  - discontinue metformin  - Will find new agent that is affordable, likely GLP-1 AMS (altered mental status) (Resolved: 11/02/2023) Likely from hypoglycemia.  Current mental status improved to baseline.  Toxicmetabolic workup negative.  - Fall precautions  - Delirium precautions  - PT/OT eval  - Tylenol  1 g Q6H PRN Alcohol use disorder 1 pint of vodka daily, recently in rehab, will monitor for alcohol withdrawal. Ethanol of 0 on admission and no alcohol withdrawal symptoms for 48 hours so will discontinue CIWAs. - discontinue CIWA  - Folic acid 1 mg daily  - Multivitamin daily  Nicotine  use Pt requesting NRT while in hospital. Did not voice if she is interested in quitting or quantify use but said she uses highest dose patch in past.  - Continue nicotine  patch 21 mg once daily for NRT Stimulant use disorder (cocaine) Treatment per inpatient psych. UDS positive on admission.    FEN/GI: diet heart PPx: lovenox  40 Dispo:Home tomorrow. Barriers include finishing high-dose thiamine .   Subjective:  Reports that she has something in her eye for the past several months. Says its in the back of her eye right now. Reports that it is painful. Regarding side effects, she does not report any diarrhea to the metformin  or sertraline . She reports that she does not want to be on metformin  b/c she had severe diarrhea in the past and that she is not willing to retrial this medications.   Objective: Temp:  [97.7 F (36.5 C)-98.6 F (37 C)] 97.7 F (36.5 C) (05/29 0837) Pulse Rate:  [85-99] 88 (05/29 0837) Resp:  [18-19] 18 (05/29 0321) BP: (109-134)/(63-97) 120/66 (05/29 0837) SpO2:  [97 %-100 %] 100 % (05/29 0837) Physical Exam: General: Calm, cooperative  laying comfortably Cardiovascular: Normal rate and rhythm.  Normal heart sounds. Respiratory: Normal effort.  Lungs clear to auscultation bilaterally. Abdomen: Soft and nontender. Extremities: No lower extremity edema bilaterally, no  wounds, warm and dry.  Laboratory: Most recent CBC Lab Results  Component Value Date   WBC 10.0 11/01/2023   HGB 15.4 (H) 11/01/2023   HCT 45.4 11/01/2023   MCV 93.8 11/01/2023   PLT 344 11/01/2023   Most recent BMP    Latest Ref Rng & Units 11/02/2023    3:51 AM  BMP  Glucose 70 - 99 mg/dL 147   BUN 8 - 23 mg/dL 18   Creatinine 8.29 - 1.00 mg/dL 5.62   Sodium 130 - 865 mmol/L 134   Potassium 3.5 - 5.1 mmol/L 4.3   Chloride 98 - 111 mmol/L 103   CO2 22 - 32 mmol/L 25   Calcium  8.9 - 10.3 mg/dL 9.4    Lipid panel: cholesterol 231, triglyceride 118, HDL 66, LDL 141 A1c: 9.5  Robin Given, MD 11/02/2023, 8:58 AM  PGY-1, [facility] FPTS Intern pager: [phone], text pages welcome Secure chat group [facility] Medical Teaching Service

## 2023-11-02 NOTE — Assessment & Plan Note (Signed)
 Likely from hypoglycemia.  Current mental status improved to baseline.  Toxicmetabolic workup negative.  - Fall precautions  - Delirium precautions  - PT/OT eval  - Tylenol  1 g Q6H PRN

## 2023-11-02 NOTE — Assessment & Plan Note (Signed)
 Unsteady gait on PT assessment, hx of significant alcohol use.  - Continue high dose thiamine  three time daily for 9 doses (last dose 5/30 0600) - Alcohol cessation per Alcohol Use Disorder below

## 2023-11-02 NOTE — Plan of Care (Signed)

## 2023-11-02 NOTE — Assessment & Plan Note (Signed)
 Pt requesting NRT while in hospital. Did not voice if she is interested in quitting or quantify use but said she uses highest dose patch in past.  - Continue nicotine  patch 21 mg once daily for NRT

## 2023-11-02 NOTE — Assessment & Plan Note (Signed)
 Hx of SA on insulin . Concern for another intentional OD on insulin . Psych saw and recommended inpatient psychiatry, starting sertraline  50 daily for MDD, and risperdal 0.5 mg night for auditory hallucinations.  - transfer to inpatient psychiatry tomorrow morning pending placement - continue 1:1 telemonitoring - continue sertraline  50 mg once daily for MDD - continue risperdal 0.5 mg once at night for auditory hallucinations

## 2023-11-02 NOTE — Assessment & Plan Note (Signed)
 Treatment per inpatient psych. UDS positive on admission.

## 2023-11-02 NOTE — Consult Note (Signed)
 [facility] Psychiatric Consult Follow Up  Patient Name: .Robin Montgomery  MRN: [id number]  DOB: 1960/07/27  Consult Order details:  Orders (From admission, onward)     Start     Ordered   10/31/23 2034  IP CONSULT TO PSYCHIATRY       Ordering Provider: Rayma Calandra, DO  Provider:  (Not yet assigned)  Question Answer Comment  Location [facility]   Reason for Consult? 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  PTSD 3.  AUD 4. Stimulant use disorder, cocaine type  Assessment  Robin Montgomery is a 63 y.o. female admitted: Medicallyfor 10/31/2023 11:32 AM for AMS and noted to have low CBG with EMS to 31. She carries the psychiatric diagnoses of MDD, PTSD, AUD, and stimulant use d/o and has a past medical history of  HTN, IDT2DM, HLD.   Her current presentation of persistently low mood, hypersomnia, hopelessness, poor energy and concentration along with auditory hallucinations that are mood congruent in the context of substance use including regular alcohol, marijuana, and cocaine use is most consistent with substance-induced depression versus MDD with psychotic features.  Patient is not currently taking psychotropic medications and states that she has been off psychotropic medication for months.  Patient is currently in the precontemplative stage of stopping substance use.  She states that she uses substances to numb her emotions and I discussed the risks of concurrent drug use while taking psychotropic medications.  She is amenable to starting Zoloft  to aid with her depression and a low-dose of Risperdal to aid with the auditory hallucinations.  While Risperdal does carry a risk of metabolic syndrome, this  is dose-dependent and with the dose that we are starting on, there is a low risk at this time.  The hallucinations may stem from the chronic substance use and may get better as she is off substances.  However, I am concerned that she will likely continue using substances moving forward.  Patient does admit to suicide attempt by overdosing on her insulin  and she is agreeable to going to inpatient psychiatric hospitalization once medically cleared.   11/02/23 Patient is in good spirits this morning.  She denies SI, HI, and AVH and continues to be agreeable to going to inpatient psych once medically cleared which per primary is likely to be tomorrow morning.  Patient tolerating her medications well, denying EPS symptoms.  Patient's support system also visit her yesterday in which she said it was a therapeutic time.  Diagnoses:  Active Hospital problems: Active Problems:   Diabetes mellitus type 2, insulin  dependent (HCC)   Intentional overdose  MDD (major depressive disorder), recurrent severe, without psychosis (HCC)   Alcohol use disorder   AMS (altered mental status)   Nicotine  use   Stimulant use disorder (cocaine)   Chronic health problem   Wernicke encephalopathy    Plan   ## Psychiatric Medication Recommendations:  - Continue Zoloft  50 mg daily for depression - Continue Risperdal 0.5 mg nightly for auditory hallucinations  ## Medical Decision Making Capacity: Not specifically addressed in this encounter  ## Further Work-up:  -- TSH WNL, lipid panel: Cholesterol 231, LDL 141,  A1c 9.5 -- most recent EKG on 5/28 had QtC of 485   ## Disposition:-- We recommend inpatient psychiatric hospitalization when medically cleared. Patient is under voluntary admission status at this time; please IVC if attempts to leave hospital.  ## Behavioral / Environmental: -Utilize compassion and acknowledge the patient's experiences while setting clear and realistic expectations for care.    ## Safety  and Observation Level:  - Based on my clinical evaluation, I estimate the patient to be at moderate risk of self harm in the current setting. - At this time, we recommend  1:1 Observation. This decision is based on my review of the chart including patient's history and current presentation, interview of the patient, mental status examination, and consideration of suicide risk including evaluating suicidal ideation, plan, intent, suicidal or self-harm behaviors, risk factors, and protective factors. This judgment is based on our ability to directly address suicide risk, implement suicide prevention strategies, and develop a safety plan while the patient is in the clinical setting. Please contact our team if there is a concern that risk level has changed.  CSSR Risk Category:C-SSRS RISK CATEGORY: High Risk  Suicide Risk Assessment: Patient has following modifiable risk factors for suicide: untreated depression and recklessness, which we are addressing by medication management and recommendation to inpatient psychiatric hospitalization. Patient has following non-modifiable or demographic risk factors for suicide: history of suicide attempt and psychiatric hospitalization Patient has the following protective factors against suicide: no history of NSSIB  Thank you for this consult request. Recommendations have been communicated to the primary team.  We will continue to follow until admitted at this time.   Joice Nares, MD       History of Present Illness   Patient Report:  Patient seen laying down in her bed, no acute distress.  She reports feeling good this morning.  She reports that Calvin visited yesterday and the visit went well.  She reports good sleep and good appetite. She denies adverse effects from starting Zoloft  and Risperdal including nausea, dizziness, HA, dystonia, and akathesia. She denies SI, HI, and AVH. She still is agreeable to go to inpatient once medically cleared.   Psych  ROS:  Depression: Reports persistent low mood, hypersomnia, feelings of hopelessness, poor energy and poor concentration Anxiety: Denies Mania (lifetime and current): Denies Psychosis: (lifetime and current): Reports auditory hallucinations  Collateral information:  Lahoma Pigg at [phone] on 5/28. No answer and did not have an option to leave a voicemail.  Review of Systems  Constitutional:  Negative for chills and fever.  Respiratory:  Negative for shortness of breath.   Cardiovascular:  Negative for chest pain and palpitations.  Gastrointestinal:  Negative for nausea and vomiting.  Neurological:  Negative for headaches.     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 Living Situation: Lives in boardinghouse and has a room Access to weapons/lethal means: Denies  Substance History Alcohol: Drinks 1 pint of alcohol daily for the past 3 years, started drinking at 63 years old Last Drink prior to admission History of alcohol  withdrawal seizures denies History of DT's denies Tobacco: Half a pack daily Illicit drugs: Daily cocaine use via inhalation Rehab hx: yes years ago  Exam Findings  Vital Signs:  Temp:  [97.7 F (36.5 C)-98.6 F (37 C)] 97.7 F (36.5 C) (05/29 0837) Pulse Rate:  [85-99] 88 (05/29 0837) Resp:  [18-19] 18 (05/29 0321) BP: (109-134)/(63-97) 120/66 (05/29 0837) SpO2:  [97 %-100 %] 100 % (05/29 0837) Blood pressure  120/66, pulse 88, temperature 97.7 F (36.5 C), temperature source Oral, resp. rate 18, height 5\' 6"  (1.676 m), weight 65 kg, SpO2 100%. Body mass index is 23.13 kg/m.  Physical Exam Vitals reviewed.  Constitutional:      Appearance: Normal appearance.  HENT:     Head: Normocephalic and atraumatic.  Cardiovascular:     Rate and Rhythm: Normal rate.  Pulmonary:     Effort: Pulmonary effort is normal.  Neurological:     General: No focal deficit present.     Mental Status: She is alert.     Mental Status Exam: General Appearance: Fairly Groomed  Orientation:  Full (Time, Place, and Person)  Memory:  Remote;   Fair  Concentration:  Concentration: Fair  Recall:  Fair  Attention  Good  Eye Contact:  Fair  Speech:  Slow  Language:  Fair  Volume:  Decreased  Mood: "good"  Affect:  Depressed and Restricted  Thought Process:  Coherent and Linear  Thought Content:  Logical  Suicidal Thoughts:  No  Homicidal Thoughts:  No  Judgement:  Fair  Insight:  Shallow  Psychomotor Activity:  Normal  Akathisia:  No  Fund of Knowledge:  Fair      Assets:  Communication Skills Desire for Improvement Resilience  Cognition:  WNL  ADL's:  Intact  AIMS (if indicated):        Other History   These have been pulled in through the EMR, reviewed, and updated if appropriate.  Family History:  The patient's family history is not on file.  Medical History: Past Medical History:  Diagnosis Date   Depression    Diabetes mellitus without complication (HCC)    Hypertension     Surgical History: Past Surgical History:  Procedure Laterality Date   CHOLECYSTECTOMY       Medications:   Current Facility-Administered Medications:    acetaminophen  (TYLENOL ) tablet 1,000 mg, 1,000 mg, Oral, Q6H PRN, 1,000 mg at 11/01/23 2116 **OR** acetaminophen  (TYLENOL ) suppository 650 mg, 650 mg, Rectal, Q6H PRN, Spence, Sarah, DO   enoxaparin  (LOVENOX ) injection 40 mg, 40 mg, Subcutaneous, Q24H,  Spence, Sarah, DO, 40 mg at 11/01/23 2116   folic acid (FOLVITE) tablet 1 mg, 1 mg, Oral, Daily, Spence, Sarah, DO, 1 mg at 11/02/23 4098   insulin  aspart (novoLOG ) injection 0-9 Units, 0-9 Units, Subcutaneous, TID WC, Shitarev, Dimitry, MD, 2 Units at 11/02/23 1191   metFORMIN  (GLUCOPHAGE -XR) 24 hr tablet 500 mg, 500 mg, Oral, Q breakfast, Shitarev, Dimitry, MD   multivitamin with minerals tablet 1 tablet, 1 tablet, Oral, Daily, Omar Bibber, DO, 1 tablet at 11/02/23 4782   nicotine  (NICODERM CQ  - dosed in mg/24 hours) patch 21 mg, 21 mg, Transdermal, Daily, McCarty, Artie, MD, 21 mg at 11/02/23 0917   risperiDONE (RISPERDAL) tablet 0.5 mg, 0.5 mg, Oral, QHS, Shamonica Schadt B, MD, 0.5 mg at 11/01/23 2116   sertraline  (ZOLOFT ) tablet 50 mg, 50 mg, Oral, Daily, Haidee Stogsdill B, MD, 50 mg at 11/02/23 0916   [COMPLETED] thiamine  (VITAMIN B1) 400 mg in  sodium chloride  0.9 % 50 mL IVPB, 400 mg, Intravenous, Q8H, Last Rate: 108 mL/hr at 11/02/23 0635, 400 mg at 11/02/23 0635 **FOLLOWED BY** thiamine  (VITAMIN B1) 500 mg in sodium chloride  0.9 % 50 mL IVPB, 500 mg, Intravenous, Q8H **FOLLOWED BY** [DISCONTINUED] thiamine  (VITAMIN B1) 200 mg in sodium chloride  0.9 % 50 mL IVPB, 200 mg, Intravenous, Q24H, Bernardino Bridge, Sarah, DO  Allergies: Allergies  Allergen Reactions   Insulins     Suicide attempt on insulin  11/01/2023   Aspirin Nausea And Vomiting    Saratha Cunas, MD PGY-2 Psychiatry  This note was created using a voice recognition software as a result there may be grammatical errors inadvertently enclosed that do not reflect the nature of this encounter. Every attempt is made to correct such errors.

## 2023-11-02 NOTE — Assessment & Plan Note (Signed)
 On admission from insulin  overdose. Resolved.

## 2023-11-02 NOTE — Progress Notes (Signed)
 Physical Therapy Treatment Patient Details Name: Robin Montgomery MRN: [id number] DOB: [birth year] Today's Date: 11/02/2023   History of Present Illness Pt is a 63 yr old female presented 10/31/23 due to AMS and hypoglycemia. Pt reports intentionally overdosing on her insulin  5/27 in an attempt to end her life. She also reports being clean/sober for 2 years however she had alcohol and did crack/cocaine yesterday as well. Reports not knowing her trigger. PMH: T2DM, polysubstance abuse, major depressive disorder, HTN    PT Comments  Pt resting in bed on arrival, pleasant and agreeable to session and demonstrating steady progress towards acute mobility goals. Pt with increased stability with all mobility this date, progressing gait distance with grossly CGA for safety without overt LOB and demonstrating ability to accept mild balance challenges with minimal postural sway. Educated pt on importance of continued mobility with pt verbalizing understanding. Pt up in chair at sink with sitter present for wash-up at end of session. Pt continues to benefit from skilled PT services to progress toward functional mobility goals.      If plan is discharge home, recommend the following: A little help with walking and/or transfers;A little help with bathing/dressing/bathroom;Assist for transportation;Help with stairs or ramp for entrance   Can travel by private vehicle        Equipment Recommendations  None recommended by PT    Recommendations for Other Services       Precautions / Restrictions Precautions Precautions: Fall Precaution/Restrictions Comments: pt shaky, unsteady Restrictions Weight Bearing Restrictions Per Provider Order: No     Mobility  Bed Mobility Overal bed mobility: Modified Independent             General bed mobility comments: HOB slightly elevated, incresaed time, able to manage covers    Transfers Overall transfer level: Needs assistance Equipment used:  None Transfers: Sit to/from Stand Sit to Stand: Contact guard assist           General transfer comment: CGA for safety and line management    Ambulation/Gait Ambulation/Gait assistance: Contact guard assist Gait Distance (Feet): 400 Feet Assistive device: None Gait Pattern/deviations: Step-through pattern, Decreased stride length, Wide base of support, Shuffle Gait velocity: dec     General Gait Details: pt with wide base of support, decreased shakiness and no LOB noted, pt able to accept mild balance challenges with increased postural sway   Stairs             Wheelchair Mobility     Tilt Bed    Modified Rankin (Stroke Patients Only)       Balance Overall balance assessment: Mild deficits observed, not formally tested                                          Communication Communication Communication: Impaired Factors Affecting Communication: Reduced clarity of speech  Cognition Arousal: Alert Behavior During Therapy: Flat affect   PT - Cognitive impairments: No family/caregiver present to determine baseline                       PT - Cognition Comments: pt with known depression, better affect this session, increased motivation to participate Following commands: Intact      Cueing Cueing Techniques: Verbal cues  Exercises      General Comments General comments (skin integrity, edema, etc.): VSS on RA  Pertinent Vitals/Pain Pain Assessment Pain Assessment: No/denies pain    Home Living                          Prior Function            PT Goals (current goals can now be found in the care plan section) Acute Rehab PT Goals Patient Stated Goal: didn't state PT Goal Formulation: With patient Time For Goal Achievement: 11/15/23 Progress towards PT goals: Progressing toward goals    Frequency    Min 2X/week      PT Plan      Co-evaluation              AM-PAC PT "6 Clicks"  Mobility   Outcome Measure  Help needed turning from your back to your side while in a flat bed without using bedrails?: None Help needed moving from lying on your back to sitting on the side of a flat bed without using bedrails?: None Help needed moving to and from a bed to a chair (including a wheelchair)?: None Help needed standing up from a chair using your arms (e.g., wheelchair or bedside chair)?: A Little Help needed to walk in hospital room?: A Little Help needed climbing 3-5 steps with a railing? : A Little 6 Click Score: 21    End of Session Equipment Utilized During Treatment: Gait belt Activity Tolerance: Patient tolerated treatment well Patient left: with call bell/phone within reach;in chair;with nursing/sitter in room (seated up in chair at sink for washup with sitter present) Nurse Communication: Mobility status PT Visit Diagnosis: Unsteadiness on feet (R26.81)     Time: 1610-9604 PT Time Calculation (min) (ACUTE ONLY): 14 min  Charges:    $Therapeutic Exercise: 8-22 mins PT General Charges $$ ACUTE PT VISIT: 1 Visit                     Antony Baumgartner R. PTA Acute Rehabilitation Services Office: [phone]   Agapito Horseman 11/02/2023, 3:29 PM

## 2023-11-03 DIAGNOSIS — E512 Wernicke's encephalopathy: Secondary | ICD-10-CM | POA: Diagnosis not present

## 2023-11-03 DIAGNOSIS — E162 Hypoglycemia, unspecified: Secondary | ICD-10-CM | POA: Diagnosis not present

## 2023-11-03 DIAGNOSIS — F332 Major depressive disorder, recurrent severe without psychotic features: Secondary | ICD-10-CM | POA: Diagnosis not present

## 2023-11-03 DIAGNOSIS — X838XXA Intentional self-harm by other specified means, initial encounter: Secondary | ICD-10-CM

## 2023-11-03 DIAGNOSIS — T1491XA Suicide attempt, initial encounter: Secondary | ICD-10-CM

## 2023-11-03 HISTORY — DX: Suicide attempt, initial encounter: T14.91XA

## 2023-11-03 LAB — GLUCOSE, CAPILLARY
Glucose-Capillary: 290 mg/dL — ABNORMAL HIGH (ref 70–99)
Glucose-Capillary: 231 mg/dL — ABNORMAL HIGH (ref 70–99)
Glucose-Capillary: 220 mg/dL — ABNORMAL HIGH (ref 70–99)
Glucose-Capillary: 149 mg/dL — ABNORMAL HIGH (ref 70–99)

## 2023-11-03 MED ORDER — GLIMEPIRIDE 1 MG PO TABS
1.0000 mg | ORAL_TABLET | Freq: Two times a day (BID) | ORAL | Status: DC
  Administered 2023-11-03 – 2023-11-07 (×10): 1 mg via ORAL
  Filled 2023-11-03 (×11): qty 1

## 2023-11-03 MED ORDER — THIAMINE HCL 100 MG/ML IJ SOLN: 500.0000 mg | Freq: Three times a day (TID) | INTRAVENOUS | Status: DC

## 2023-11-03 MED ORDER — GLIMEPIRIDE 1 MG PO TABS: 1.0000 mg | ORAL_TABLET | Freq: Two times a day (BID) | ORAL | Status: AC

## 2023-11-03 MED ORDER — SERTRALINE HCL 50 MG PO TABS
50.0000 mg | ORAL_TABLET | Freq: Every day | ORAL | Status: AC
Start: 2023-11-04 — End: ?

## 2023-11-03 MED ORDER — RISPERIDONE 0.5 MG PO TABS: 0.5000 mg | ORAL_TABLET | Freq: Every day | ORAL | Status: DC

## 2023-11-03 MED ORDER — ADULT MULTIVITAMIN W/MINERALS CH
1.0000 | ORAL_TABLET | Freq: Every day | ORAL | Status: DC
Start: 2023-11-04 — End: 2023-11-12

## 2023-11-03 MED ORDER — THIAMINE HCL 100 MG/ML IJ SOLN: 400.0000 mg | Freq: Three times a day (TID) | INTRAVENOUS | Status: DC

## 2023-11-03 MED ORDER — THIAMINE HCL 100 MG/ML IJ SOLN
500.0000 mg | Freq: Three times a day (TID) | INTRAVENOUS | Status: DC
  Administered 2023-11-03 – 2023-11-05 (×6): 500 mg via INTRAVENOUS
  Filled 2023-11-03 (×4): qty 5
  Filled 2023-11-03: qty 500
  Filled 2023-11-03 (×3): qty 5

## 2023-11-03 NOTE — Assessment & Plan Note (Addendum)
 Unsteady gait on PT assessment, hx of significant alcohol use. S/p high dose thiamine  IV x9 doses. Guidelines recommend IV thiamine  500 tid for 3 days but up to 5 days if there is clinical response.  Start 3 more doses of thiamine  IV while in hospital until tomorrow morning Okay to discharge prior to these doses as these are extra doses but within guideline recommendations

## 2023-11-03 NOTE — Assessment & Plan Note (Signed)
 1 pint of vodka daily, recently in rehab, will monitor for alcohol withdrawal. Ethanol of 0 on admission and no alcohol withdrawal symptoms for 48 hours so will discontinue CIWAs. - Folic acid 1 mg daily  - Multivitamin daily

## 2023-11-03 NOTE — Inpatient Diabetes Management (Signed)
 Inpatient Diabetes Program Recommendations  AACE/ADA: New Consensus Statement on Inpatient Glycemic Control (2015)  Target Ranges:  Prepandial:   less than 140 mg/dL      Peak postprandial:   less than 180 mg/dL (1-2 hours)      Critically ill patients:  140 - 180 mg/dL   Lab Results  Component Value Date   GLUCAP 290 (H) 11/03/2023   HGBA1C 9.5 (H) 11/02/2023    Review of Glycemic Control  Latest Reference Range & Units 11/02/23 06:16 11/02/23 12:57 11/02/23 16:07 11/02/23 21:41 11/03/23 06:28  Glucose-Capillary 70 - 99 mg/dL 782 (H) 956 (H) 213 (H) 309 (H) 290 (H)  (H): Data is abnormally high  Home DM medications:  Levemir  30 units bid, Jardiance  10 mg daily, Metformin  500 mg daily   Inpatient Diabetes Program Recommendations:     While inpatient, please consider:   Semglee  20 units every day Novolog  3 units TID  Appears she may be admitted to [facility]; we can follow glucose trends and help with DC recommendations.  May want to avoid insulin  if possible.  She will need to make diet modifications and have close F/U with PCP.  Thank you, Hays Lipschutz, MSN, CDCES Diabetes Coordinator Inpatient Diabetes Program [phone] (team pager from 8a-5p)

## 2023-11-03 NOTE — Progress Notes (Signed)
 Occupational Therapy Treatment Patient Details Name: Robin Montgomery MRN: [id number] DOB: 1960/08/08 Today's Date: 11/03/2023   History of present illness Pt is a 63 yr old female presented 10/31/23 due to AMS and hypoglycemia. Pt reports intentionally overdosing on her insulin  5/27 in an attempt to end her life. She also reports being clean/sober for 2 years however she had alcohol and did crack/cocaine yesterday as well. Reports not knowing her trigger. PMH: T2DM, polysubstance abuse, major depressive disorder, HTN   OT comments  Pt. Seen for skilled OT treatment session.  Pt. Able to complete bed mobility with MOD I.  Gathered items in room at various heights and locations with CGA/S.  Partial post. LOB x1 when entering the b.room.  therapist asst. Supported and pt. Regained balance.  Toileting task with CGA.  UB/LB B/D with setup/S sit/stand.  Pt. Progressing well with acute OT goals.  Cont. With acute OT POC.        If plan is discharge home, recommend the following:  A little help with walking and/or transfers;A little help with bathing/dressing/bathroom;Assistance with cooking/housework;Assist for transportation   Equipment Recommendations  None recommended by OT    Recommendations for Other Services      Precautions / Restrictions Precautions Precautions: Fall Precaution/Restrictions Comments: pt shaky, unsteady       Mobility Bed Mobility Overal bed mobility: Modified Independent             General bed mobility comments: HOB slightly elevated, incresaed time, able to manage covers    Transfers Overall transfer level: Needs assistance Equipment used: None Transfers: Sit to/from Stand, Bed to chair/wheelchair/BSC Sit to Stand: Contact guard assist     Step pivot transfers: Contact guard assist     General transfer comment: CGA for safety and line management     Balance                                           ADL either performed or  assessed with clinical judgement   ADL Overall ADL's : Needs assistance/impaired     Grooming: Wash/dry hands;Wash/dry face;Sitting;Standing;Supervision/safety   Upper Body Bathing: Set up;Sitting   Lower Body Bathing: Supervison/ safety;Sit to/from stand   Upper Body Dressing : Set up;Sitting       Toilet Transfer: Contact guard assist   Toileting- Clothing Manipulation and Hygiene: Contact guard assist;Sitting/lateral lean;Sit to/from stand       Functional mobility during ADLs: Contact guard assist General ADL Comments: placed ADL supplies at various heights and locations throughout the room. pt. able to gather all indicated items with CGA for bending to get item off of the floor, but S for all other items    Extremity/Trunk Assessment              Vision       Perception     Praxis     Communication Communication Communication: Impaired Factors Affecting Communication: Reduced clarity of speech   Cognition Arousal: Alert Behavior During Therapy: Flat affect                                          Cueing   Cueing Techniques: Verbal cues  Exercises      Shoulder Instructions       General Comments  Pertinent Vitals/ Pain       Pain Assessment Pain Assessment: No/denies pain  Home Living                                          Prior Functioning/Environment              Frequency  Min 2X/week        Progress Toward Goals  OT Goals(current goals can now be found in the care plan section)  Progress towards OT goals: Progressing toward goals     Plan      Co-evaluation                 AM-PAC OT "6 Clicks" Daily Activity     Outcome Measure   Help from another person eating meals?: None Help from another person taking care of personal grooming?: None Help from another person toileting, which includes using toliet, bedpan, or urinal?: A Little Help from another person bathing  (including washing, rinsing, drying)?: A Little Help from another person to put on and taking off regular upper body clothing?: None Help from another person to put on and taking off regular lower body clothing?: A Little 6 Click Score: 21    End of Session    OT Visit Diagnosis: Unsteadiness on feet (R26.81);Other abnormalities of gait and mobility (R26.89);Muscle weakness (generalized) (M62.81)   Activity Tolerance Patient tolerated treatment well   Patient Left in chair;with call bell/phone within reach;with nursing/sitter in room   Nurse Communication Other (comment) (reviewed with CNA pt. had bath and had used the b.room during session)        Time: 8119-1478 OT Time Calculation (min): 19 min  Charges: OT General Charges $OT Visit: 1 Visit OT Treatments $Self Care/Home Management : 8-22 mins  Howell Macintosh, COTA/L Acute Rehabilitation [phone]   Leory Rands Lorraine-COTA/L  11/03/2023, 1:14 PM

## 2023-11-03 NOTE — Assessment & Plan Note (Signed)
 Hx of SA on insulin . Concern for another intentional OD on insulin . Psych saw and recommended inpatient psychiatry, starting sertraline  50 daily for MDD, and risperdal 0.5 mg night for auditory hallucinations.  - transfer to inpatient psychiatry pending - continue 1:1 telemonitoring - continue sertraline  50 mg once daily for MDD - continue risperdal 0.5 mg once at night for auditory hallucinations

## 2023-11-03 NOTE — Assessment & Plan Note (Addendum)
 Stopped long acting insulin  due to intentional overdose x2 and plan to switch to oral agent w/o OD potential.  Reports that she does not want metformin  and has had very bad diarrhea in past in this medication. A1c 9s on 60 units insulin  so may require switching back to long acting insulin .  - Continue CBGs to with meals and at bedtime, sensitive SSI - Discontinue home Levemir  - start glimepiride  1 mg  - consider pioglitazone - diabetes coordinator will continue to follow

## 2023-11-03 NOTE — Consult Note (Signed)
 [facility] Psychiatric Consult Follow Up  Patient Name: .Robin Montgomery  MRN: [id number]  DOB: 1961/05/06  Consult Order details:  Orders (From admission, onward)     Start     Ordered   10/31/23 2034  IP CONSULT TO PSYCHIATRY       Ordering Provider: Rayma Calandra, DO  Provider:  (Not yet assigned)  Question Answer Comment  Location [facility]   Reason for Consult? 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  PTSD 3.  AUD 4. Stimulant use disorder, cocaine type  Assessment  Robin Montgomery is a 63 y.o. female admitted: [facility]icallyfor 10/31/2023 11:32 AM for AMS and noted to have low CBG with EMS to 31. She carries the psychiatric diagnoses of MDD, PTSD, AUD, and stimulant use d/o and has a past medical history of  HTN, IDT2DM, HLD.   Her current presentation of persistently low mood, hypersomnia, hopelessness, poor energy and concentration along with auditory hallucinations that are mood congruent in the context of substance use including regular alcohol, marijuana, and cocaine use is most consistent with substance-induced depression versus MDD with psychotic features.  Patient is not currently taking psychotropic medications and states that she has been off psychotropic medication for months.  Patient is currently in the precontemplative stage of stopping substance use.  She states that she uses substances to numb her emotions and I discussed the risks of concurrent drug use while taking psychotropic medications.  She is amenable to starting Zoloft  to aid with her depression and a low-dose of Risperdal  to aid with the auditory hallucinations.  While Risperdal  does carry a risk of metabolic syndrome, this  is dose-dependent and with the dose that we are starting on, there is a low risk at this time.  The hallucinations may stem from the chronic substance use and may get better as she is off substances.  However, I am concerned that she will likely continue using substances moving forward.  Patient does admit to suicide attempt by overdosing on her insulin  and she is agreeable to going to inpatient psychiatric hospitalization once medically cleared.   11/02/23 Patient is in good spirits this morning.  She denies SI, HI, and AVH and continues to be agreeable to going to inpatient psych once medically cleared which per primary is likely to be tomorrow morning.  Patient tolerating her medications well, denying EPS symptoms.  Patient's support system also visit her yesterday in which she said it was a therapeutic time.  11/03/2023 Patient now medically cleared to go to inpatient psychiatric hospital, awaiting bed availability. Patient is agreeable to go voluntarily.  Diagnoses:  Active Hospital problems: Active Problems:   Diabetes mellitus type 2, insulin  dependent (HCC)   Intentional overdose  MDD (major depressive disorder), recurrent severe, without psychosis (HCC)   Alcohol use disorder   Nicotine  use   Stimulant use disorder (cocaine)   Chronic health problem   Wernicke encephalopathy    Plan   ## Psychiatric Medication Recommendations:  - Continue Zoloft  50 mg daily for depression - Continue Risperdal  0.5 mg nightly for auditory hallucinations  ## Medical Decision Making Capacity: Not specifically addressed in this  encounter  ## Further Work-up:  -- TSH WNL, lipid panel: Cholesterol 231, LDL 141, A1c 9.5 -- most recent EKG on 5/28 had QtC of 485   ## Disposition:-- We recommend inpatient psychiatric hospitalization when medically cleared. Patient is under voluntary admission status at this time; please IVC if attempts to leave hospital.  ## Behavioral / Environmental: -Utilize  compassion and acknowledge the patient's experiences while setting clear and realistic expectations for care.    ## Safety and Observation Level:  - Based on my clinical evaluation, I estimate the patient to be at moderate risk of self harm in the current setting. - At this time, we recommend  1:1 Observation. This decision is based on my review of the chart including patient's history and current presentation, interview of the patient, mental status examination, and consideration of suicide risk including evaluating suicidal ideation, plan, intent, suicidal or self-harm behaviors, risk factors, and protective factors. This judgment is based on our ability to directly address suicide risk, implement suicide prevention strategies, and develop a safety plan while the patient is in the clinical setting. Please contact our team if there is a concern that risk level has changed.  CSSR Risk Category:C-SSRS RISK CATEGORY: High Risk  Suicide Risk Assessment: Patient has following modifiable risk factors for suicide: untreated depression and recklessness, which we are addressing by medication management and recommendation to inpatient psychiatric hospitalization. Patient has following non-modifiable or demographic risk factors for suicide: history of suicide attempt and psychiatric hospitalization Patient has the following protective factors against suicide: no history of NSSIB  Thank you for this consult request. Recommendations have been communicated to the primary team.  We will continue to follow until admitted at this time.   Joice Nares, MD       History of Present Illness   Patient Report:  The patient was seen laying in bed, no acute distress. On assessment, the patient feels "good" today. When asked about speaking with family, she reports speaking to [place] last night.   Patient reports having good sleep.  Patient reports good appetite. Patient feels that the medications have been helpful  with giving her a clear head and denies adverse effects.  Patient denies current SI, HI, AVH.  Psych ROS:  Depression: Reports persistent low mood, hypersomnia, feelings of hopelessness, poor energy and poor concentration Anxiety: Denies Mania (lifetime and current): Denies Psychosis: (lifetime and current): Reports auditory hallucinations  Collateral information:  Lahoma Pigg at [phone] on 5/28. No answer and did not have an option to leave a voicemail.  Review of Systems  Constitutional:  Negative for chills and fever.  Respiratory:  Negative for shortness of breath.   Cardiovascular:  Negative for chest pain and palpitations.  Gastrointestinal:  Negative for nausea and vomiting.  Neurological:  Negative for headaches.     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 Living Situation: Lives in boardinghouse and has a room Access to weapons/lethal means: Denies  Substance History Alcohol: Drinks 1 pint of alcohol daily for the past 3 years, started drinking at  63 years old Last Drink prior to admission History of alcohol withdrawal seizures denies History of DT's denies Tobacco: Half a pack daily Illicit drugs: Daily cocaine use via inhalation Rehab hx: yes years ago  Exam Findings  Vital Signs:  Temp:  [97.6 F (36.4 C)-98.5 F (36.9 C)] 97.8 F (36.6 C) (05/30 0801) Pulse Rate:  [96-100] 96 (05/29 1920) Resp:  [18] 18 (05/30  0801) BP: (108-143)/(56-97) 143/66 (05/29 1920) SpO2:  [96 %-99 %] 96 % (05/30 0801) Blood pressure (!) 143/66, pulse 96, temperature 97.8 F (36.6 C), temperature source Oral, resp. rate 18, height 5\' 6"  (1.676 m), weight 65 kg, SpO2 96%. Body mass index is 23.13 kg/m.  Physical Exam Vitals reviewed.  Constitutional:      Appearance: Normal appearance.  HENT:     Head: Normocephalic and atraumatic.  Cardiovascular:     Rate and Rhythm: Normal rate.  Pulmonary:     Effort: Pulmonary effort is normal.  Neurological:     General: No focal deficit present.     Mental Status: She is alert.     Mental Status Exam: General Appearance: Fairly Groomed  Orientation:  Full (Time, Place, and Person)  Memory:  Remote;   Fair  Concentration:  Concentration: Fair  Recall:  Fair  Attention  Good  Eye Contact:  Fair  Speech:  Slow  Language:  Fair  Volume:  Decreased  Mood: "fine"  Affect:  Congruent and Restricted  Thought Process:  Coherent and Linear  Thought Content:  Logical  Suicidal Thoughts:  No  Homicidal Thoughts:  No  Judgement:  Fair  Insight:  Shallow  Psychomotor Activity:  Normal  Akathisia:  No  Fund of Knowledge:  Fair      Assets:  Communication Skills Desire for Improvement Resilience  Cognition:  WNL  ADL's:  Intact  AIMS (if indicated):        Other History   These have been pulled in through the EMR, reviewed, and updated if appropriate.  Family History:  The patient's family history is not on file.  Medical History: Past Medical History:  Diagnosis Date  . Depression   . Diabetes mellitus without complication (HCC)   . Hypertension     Surgical History: Past Surgical History:  Procedure Laterality Date  . CHOLECYSTECTOMY       Medications:   Current Facility-Administered Medications:  .  acetaminophen  (TYLENOL ) tablet 1,000 mg, 1,000 mg, Oral, Q6H PRN, 1,000 mg at 11/03/23 4782 **OR** acetaminophen  (TYLENOL ) suppository 650 mg, 650  mg, Rectal, Q6H PRN, Spence, Sarah, DO .  enoxaparin  (LOVENOX ) injection 40 mg, 40 mg, Subcutaneous, Q24H, Spence, Sarah, DO, 40 mg at 11/02/23 2136 .  folic acid  (FOLVITE ) tablet 1 mg, 1 mg, Oral, Daily, Omar Bibber, DO, 1 mg at 11/02/23 0916 .  insulin  aspart (novoLOG ) injection 0-9 Units, 0-9 Units, Subcutaneous, TID WC, Shitarev, Dimitry, MD, 5 Units at 11/03/23 0629 .  multivitamin with minerals tablet 1 tablet, 1 tablet, Oral, Daily, Omar Bibber, DO, 1 tablet at 11/02/23 9562 .  nicotine  (NICODERM CQ  - dosed in mg/24 hours) patch 21 mg, 21 mg, Transdermal, Daily, McCarty, Artie, MD, 21 mg at 11/02/23 1308 .  risperiDONE  (RISPERDAL ) tablet 0.5 mg, 0.5 mg, Oral, QHS, Deyonte Cadden B, MD, 0.5 mg at 11/02/23 2136 .  sertraline  (ZOLOFT ) tablet 50 mg, 50 mg, Oral, Daily, Argel Pablo B, MD, 50 mg at 11/02/23 6578  Allergies: Allergies  Allergen Reactions  . Insulins     Suicide  attempt on insulin  11/01/2023  . Aspirin Nausea And Vomiting    Saratha Cunas, MD PGY-2 Psychiatry  This note was created using a voice recognition software as a result there may be grammatical errors inadvertently enclosed that do not reflect the nature of this encounter. Every attempt is made to correct such errors.

## 2023-11-03 NOTE — Progress Notes (Signed)
 Daily Progress Note Intern Pager: [phone]  Patient name: Robin Montgomery Medical record number: [id number] Date of birth: [birth year] Age: 63 y.o. Gender: female  Primary Care Provider: Nida Barrow, Clinic Consultants: Psychiatry Code Status: Full  Pt Overview and Major Events to Date:  Robin Montgomery is a 63 y.o. female with history of insulin  dependent T2DM, polysubstance abuse, major depressive disorder, and hypertension presenting via EMS with AMS and hypoglycemia.   Assessment and Plan: Medically stable for transfer to inpatient psychiatry.  Assessment & Plan Wernicke encephalopathy Unsteady gait on PT assessment, hx of significant alcohol use. S/p high dose thiamine  IV x9 doses. Guidelines recommend IV thiamine  500 tid for 3 days but up to 5 days if there is clinical response.  Start 3 more doses of thiamine  IV while in hospital until tomorrow morning Okay to discharge prior to these doses as these are extra doses but within guideline recommendations Intentional overdose  MDD (major depressive disorder), recurrent severe, without psychosis (HCC) Hx of SA on insulin . Concern for another intentional OD on insulin . Psych saw and recommended inpatient psychiatry, starting sertraline  50 daily for MDD, and risperdal 0.5 mg night for auditory hallucinations.  - transfer to inpatient psychiatry pending - continue 1:1 telemonitoring - continue sertraline  50 mg once daily for MDD - continue risperdal 0.5 mg once at night for auditory hallucinations Diabetes mellitus type 2, insulin  dependent (HCC) Stopped long acting insulin  due to intentional overdose x2 and plan to switch to oral agent w/o OD potential.  Reports that she does not want metformin  and has had very bad diarrhea in past in this medication. A1c 9s on 60 units insulin  so may require switching back to long acting insulin .  - Continue CBGs to with meals and at bedtime, sensitive SSI - Discontinue home Levemir  - start  glimepiride  1 mg  - consider pioglitazone - diabetes coordinator will continue to follow  Alcohol use disorder 1 pint of vodka daily, recently in rehab, will monitor for alcohol withdrawal. Ethanol of 0 on admission and no alcohol withdrawal symptoms for 48 hours so will discontinue CIWAs. - Folic acid 1 mg daily  - Multivitamin daily  Nicotine  use Pt requesting NRT while in hospital. Did not voice if she is interested in quitting or quantify use but said she uses highest dose patch in past.  - Continue nicotine  patch 21 mg once daily for NRT Stimulant use disorder (cocaine) Treatment per inpatient psych. UDS positive on admission.    FEN/GI: Carb modified PPx: Lovenox  40 [facility]  Medically stable.  Subjective:  Reports no concerns. Reports her PCP is [facility] and was updated in chart. Set her up PCP appointment. She reports she is okay with starting oral agents for DM, that are not metformin . Discussed that if she does not go to [facility] this morning we will give her more doses of IV thiamine  as guidelines recommend 3 but up to 5 days if there is clinical response.   Objective: Temp:  [97.6 F (36.4 C)-98.5 F (36.9 C)] 97.8 F (36.6 C) (05/30 0801) Pulse Rate:  [96-100] 96 (05/29 1920) Resp:  [18] 18 (05/30 0801) BP: (108-143)/(56-97) 143/66 (05/29 1920) SpO2:  [96 %-99 %] 96 % (05/30 0801) Physical Exam: General: clm, cooperative, not in distress, depressed appearing Cardiovascular: normal rate and rhythm. Normal heart sounds.  Respiratory: normal effort. Clear to auscultation bilaterally.  Abdomen: soft and nontender.  Extremities: no lower extremity edema. Warm and ddry  Laboratory: Most  recent CBC Lab Results  Component Value Date   WBC 10.0 11/01/2023   HGB 15.4 (H) 11/01/2023   HCT 45.4 11/01/2023   MCV 93.8 11/01/2023   PLT 344 11/01/2023   Most recent BMP    Latest Ref Rng & Units 11/02/2023    3:51 AM  BMP   Glucose 70 - 99 mg/dL 644   BUN 8 - 23 mg/dL 18   Creatinine 0.34 - 1.00 mg/dL 7.42   Sodium 595 - 638 mmol/L 134   Potassium 3.5 - 5.1 mmol/L 4.3   Chloride 98 - 111 mmol/L 103   CO2 22 - 32 mmol/L 25   Calcium  8.9 - 10.3 mg/dL 9.4     [name], MD 11/03/2023, 11:06 AM  PGY-1, [facility] FPTS Intern pager: [phone], text pages welcome Secure chat group [facility] Teaching Service

## 2023-11-03 NOTE — Assessment & Plan Note (Addendum)
 Pt requesting NRT while in hospital. Did not voice if she is interested in quitting or quantify use but said she uses highest dose patch in past.  - Continue nicotine  patch 21 mg once daily for NRT

## 2023-11-03 NOTE — Assessment & Plan Note (Addendum)
 Treatment per inpatient psych. UDS positive on admission.

## 2023-11-03 NOTE — Progress Notes (Signed)
 Mobility Specialist Progress Note:    11/03/23 1447  Mobility  Activity Ambulated independently in hallway;Ambulated independently in room  Level of Assistance Independent  Assistive Device None  Distance Ambulated (ft) 400 ft  Activity Response Tolerated well  Mobility Referral Yes  Mobility visit 1 Mobility  Mobility Specialist Start Time (ACUTE ONLY) 1440  Mobility Specialist Stop Time (ACUTE ONLY) 1447  Mobility Specialist Time Calculation (min) (ACUTE ONLY) 7 min   Pt received in chair, agreeable to mobility session. Ambulated in hallway, no AD required. Tolerated well, asx throughout. Returned pt to room, all needs met. Sitter in room.  Zaylei Mullane Mobility Specialist Please contact via Special educational needs teacher or  Rehab office at [phone]

## 2023-11-04 LAB — GLUCOSE, CAPILLARY
Glucose-Capillary: 254 mg/dL — ABNORMAL HIGH (ref 70–99)
Glucose-Capillary: 201 mg/dL — ABNORMAL HIGH (ref 70–99)
Glucose-Capillary: 149 mg/dL — ABNORMAL HIGH (ref 70–99)
Glucose-Capillary: 222 mg/dL — ABNORMAL HIGH (ref 70–99)

## 2023-11-04 MED ORDER — ORAL CARE MOUTH RINSE: 15.0000 mL | OROMUCOSAL | Status: DC | PRN

## 2023-11-04 NOTE — Assessment & Plan Note (Signed)
 Stable.  Pending transfer - transfer to inpatient psychiatry pending - continue 1:1 telemonitoring - continue sertraline  50 mg once daily for MDD - continue risperdal  0.5 mg once at night for auditory hallucinations

## 2023-11-04 NOTE — Assessment & Plan Note (Signed)
 Stable. - Continue CBGs to with meals and at bedtime, sensitive SSI - Discontinue home Levemir  - start glimepiride  1 mg  - consider pioglitazone - diabetes coordinator will continue to follow

## 2023-11-04 NOTE — Assessment & Plan Note (Signed)
 Treatment per inpatient psych. UDS positive on admission.

## 2023-11-04 NOTE — Assessment & Plan Note (Signed)
 Pt requesting NRT while in hospital. Did not voice if she is interested in quitting or quantify use but said she uses highest dose patch in past.  - Continue nicotine  patch 21 mg once daily for NRT

## 2023-11-04 NOTE — Care Plan (Signed)
 Paged by Birder Bugler that patient now refusing [facility] and wanting to go home. Went bedside to discuss with patient. Explained my concerns that she will not be safe at home given suicide attempt which initiated this hospitalization. Patient is adamant that she is "in a better place" and will not re-attempt. She shares that she must go home to take care of her cat. Repeated attempts made to encourage voluntary [facility] but patient continues to decline. Will likely need IVC.  Omar Bibber, DO [facility], PGY-1 11/04/23 11:07 AM  Service pager [phone]

## 2023-11-04 NOTE — Plan of Care (Signed)

## 2023-11-04 NOTE — Assessment & Plan Note (Signed)
 1 pint of vodka daily, recently in rehab, will monitor for alcohol withdrawal. Ethanol of 0 on admission and no alcohol withdrawal symptoms for 48 hours so will discontinue CIWAs. - Folic acid 1 mg daily  - Multivitamin daily

## 2023-11-04 NOTE — Progress Notes (Signed)
     Daily Progress Note Intern Pager: [phone]  Patient name: Robin Montgomery Medical record number: [id number] Date of birth: 02-02-1961 Age: 63 y.o. Gender: female  Primary Care Provider: Nida Barrow, Clinic Consultants: psychiatry Code Status: Full  Pt Overview and Major Events to Date:  5/27: Admitted  Assessment and Plan:  This is a 63 year old patient presenting with hypoglycemia secondary to intentional insulin  overdose.  She is currently medically stable and awaiting transfer to inpatient psychiatry. Assessment & Plan Wernicke encephalopathy Patient will complete final dose of IV thiamine  this morning. Final dose of IV thiamine  this a.m. Okay to discharge prior to these doses as these are extra doses but within guideline recommendations Intentional overdose  MDD (major depressive disorder), recurrent severe, without psychosis (HCC) Stable.  Pending transfer - transfer to inpatient psychiatry pending - continue 1:1 telemonitoring - continue sertraline  50 mg once daily for MDD - continue risperdal  0.5 mg once at night for auditory hallucinations Diabetes mellitus type 2, insulin  dependent (HCC) Stable. - Continue CBGs to with meals and at bedtime, sensitive SSI - Discontinue home Levemir  - start glimepiride  1 mg  - consider pioglitazone - diabetes coordinator will continue to follow  Alcohol use disorder 1 pint of vodka daily, recently in rehab, will monitor for alcohol withdrawal. Ethanol of 0 on admission and no alcohol withdrawal symptoms for 48 hours so will discontinue CIWAs. - Folic acid  1 mg daily  - Multivitamin daily  Nicotine  use Pt requesting NRT while in hospital. Did not voice if she is interested in quitting or quantify use but said she uses highest dose patch in past.  - Continue nicotine  patch 21 mg once daily for NRT Stimulant use disorder (cocaine) Treatment per inpatient psych. UDS positive on admission.   FEN/GI: Carb modified PPx:  Lovenox  Dispo:Transfer to [facility] bed is available.  Subjective:  Resting comfortably on exam. No acute ovn events  Objective: Temp:  [97.7 F (36.5 C)-98.1 F (36.7 C)] 97.7 F (36.5 C) (05/31 0449) Pulse Rate:  [91-99] 91 (05/31 0449) Resp:  [18-19] 18 (05/31 0449) BP: (104-136)/(68-91) 136/91 (05/31 0449) SpO2:  [96 %-100 %] 97 % (05/31 0449) Physical Exam: General: Well appearing, elderly female, no distress Cardiovascular: RRR, no m/r/g Respiratory: CTAB, no increased WOB Extremities: no peripheral edema, 2+ pulses  Laboratory: Most recent CBC Lab Results  Component Value Date   WBC 10.0 11/01/2023   HGB 15.4 (H) 11/01/2023   HCT 45.4 11/01/2023   MCV 93.8 11/01/2023   PLT 344 11/01/2023   Most recent BMP    Latest Ref Rng & Units 11/02/2023    3:51 AM  BMP  Glucose 70 - 99 mg/dL 147   BUN 8 - 23 mg/dL 18   Creatinine 8.29 - 1.00 mg/dL 5.62   Sodium 130 - 865 mmol/L 134   Potassium 3.5 - 5.1 mmol/L 4.3   Chloride 98 - 111 mmol/L 103   CO2 22 - 32 mmol/L 25   Calcium  8.9 - 10.3 mg/dL 9.4    Rayma Calandra, DO 11/04/2023, 5:10 AM  PGY-1, [facility] FPTS Intern pager: [phone], text pages welcome Secure chat group [facility] Teaching Service

## 2023-11-04 NOTE — Assessment & Plan Note (Signed)
 Patient will complete final dose of IV thiamine  this morning. Final dose of IV thiamine  this a.m. Okay to discharge prior to these doses as these are extra doses but within guideline recommendations

## 2023-11-04 NOTE — TOC Progression Note (Signed)
 Transition of Care [facility]) - Progression Note    Patient Details  Name: Robin Montgomery MRN: [id number] Date of Birth: January 31, 1961  Transition of Care [facility]) CM/SW Contact  Arron Big, [place] Phone Number: 11/04/2023, 12:05 PM  Clinical Narrative:   CSW met patient at bedside and introduced self. Patient stated she does want to go home being that she has a pet that she wants to get back to. CSW expressed understanding and informed patient that she signed the Voluntary form on 5/29. CSW went forth to explain to patient that if she does go to [facility] voluntarily it is typically a shorter stay and if she does not go voluntarily she will have to be involuntarily committed. Patient expressed her understanding and is agreeable to going voluntarily. CSW updated treatment team. CSW will fax patient out at this time.  TOC will continue to follow.    Expected Discharge Plan: Psychiatric Hospital Barriers to Discharge: Continued Medical Work up (psych placement)  Expected Discharge Plan and Services In-house Referral: Clinical Social Work     Living arrangements for the past 2 months: Single Family Home                                       Social Determinants of Health (SDOH) Interventions SDOH Screenings   Food Insecurity: Patient Declined (11/01/2023)  Housing: Low Risk  (11/01/2023)  Transportation Needs: No Transportation Needs (11/01/2023)  Utilities: Not At Risk (11/01/2023)  Depression (PHQ2-9): Medium Risk (02/28/2022)  Financial Resource Strain: Medium Risk (01/19/2021)  Social Connections: Unknown (10/19/2021)   [facility] from [facility]  Stress: Stress Concern Present (01/19/2021)  Tobacco Use: High Risk (10/31/2023)    Readmission Risk Interventions    11/19/2022    8:43 AM  Readmission Risk Prevention Plan  Transportation Screening Complete  PCP or Specialist Appt within 5-7 Days Complete  Home Care Screening Complete  Medication Review (RN CM)  Complete

## 2023-11-05 ENCOUNTER — Encounter (HOSPITAL_COMMUNITY): Payer: Self-pay | Admitting: Family Medicine

## 2023-11-05 DIAGNOSIS — F1090 Alcohol use, unspecified, uncomplicated: Secondary | ICD-10-CM

## 2023-11-05 DIAGNOSIS — F332 Major depressive disorder, recurrent severe without psychotic features: Secondary | ICD-10-CM | POA: Diagnosis not present

## 2023-11-05 DIAGNOSIS — Z046 Encounter for general psychiatric examination, requested by authority: Secondary | ICD-10-CM | POA: Diagnosis not present

## 2023-11-05 DIAGNOSIS — E512 Wernicke's encephalopathy: Secondary | ICD-10-CM | POA: Diagnosis not present

## 2023-11-05 LAB — GLUCOSE, CAPILLARY
Glucose-Capillary: 228 mg/dL — ABNORMAL HIGH (ref 70–99)
Glucose-Capillary: 101 mg/dL — ABNORMAL HIGH (ref 70–99)
Glucose-Capillary: 109 mg/dL — ABNORMAL HIGH (ref 70–99)

## 2023-11-05 MED ORDER — THIAMINE MONONITRATE 100 MG PO TABS
100.0000 mg | ORAL_TABLET | Freq: Every day | ORAL | Status: DC
  Administered 2023-11-05 – 2023-11-07 (×3): 100 mg via ORAL
  Filled 2023-11-05 (×3): qty 1

## 2023-11-05 NOTE — Consult Note (Addendum)
 [facility] Psychiatric Consult Follow Up  Patient Name: .Robin Montgomery  MRN: [id number]  DOB: 02-18-1961  Consult Order details:  Orders (From admission, onward)     Start     Ordered   10/31/23 2034  IP CONSULT TO PSYCHIATRY       Ordering Provider: Rayma Calandra, DO  Provider:  (Not yet assigned)  Question Answer Comment  Location [facility]   Reason for Consult? 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  PTSD 3.  AUD 4. Stimulant use disorder, cocaine type  Assessment  Robin Montgomery is a 63 y.o. female admitted: Medicallyfor 10/31/2023 11:32 AM for AMS and noted to have low CBG with EMS to 31. She carries the psychiatric diagnoses of MDD, PTSD, AUD, and stimulant use d/o and has a past medical history of  HTN, IDT2DM, HLD.   Her current presentation of persistently low mood, hypersomnia, hopelessness, poor energy and concentration along with auditory hallucinations that are mood congruent in the context of substance use including regular alcohol, marijuana, and cocaine use is most consistent with substance-induced depression versus MDD with psychotic features.  Patient is not currently taking psychotropic medications and states that she has been off psychotropic medication for months.  Patient is currently in the precontemplative stage of stopping substance use.  She states that she uses substances to numb her emotions and I discussed the risks of concurrent drug use while taking psychotropic medications.  She is amenable to starting Zoloft  to aid with her depression and a low-dose of Risperdal  to aid with the auditory hallucinations.  While Risperdal  does carry a risk of metabolic syndrome, this  is dose-dependent and with the dose that we are starting on, there is a low risk at this time.  The hallucinations may stem from the chronic substance use and may get better as she is off substances.  However, I am concerned that she will likely continue using substances moving forward.  Patient does admit to suicide attempt by overdosing on her insulin  and she is agreeable to going to inpatient psychiatric hospitalization once medically cleared.   11/02/23 Patient is in good spirits this morning.  She denies SI, HI, and AVH and continues to be agreeable to going to inpatient psych once medically cleared which per primary is likely to be tomorrow morning.  Patient tolerating her medications well, denying EPS symptoms.  Patient's support system also visit her yesterday in which she said it was a therapeutic time.  11/03/2023 Patient now medically cleared to go to inpatient psychiatric hospital, awaiting bed availability. Patient is agreeable to go voluntarily.  11/05/2023 The client was very drowsy on assessment and stated she was "ok", sleep was "ok, and appetite as a "yeah".  No behavior issues per the sitter or complaints of withdrawal symptoms.  Yesterday, the client wanted to leave because she has an animal at home and this provider completed the IVC paperwork (in chart on the floor).  However, the social worker talked her into to continuing voluntarily.  She was in agreement still this am.  Diagnoses:  Active Hospital problems: Active Problems:   Intentional overdose  MDD (major  depressive disorder), recurrent severe, without psychosis (HCC)   Diabetes mellitus type 2, insulin  dependent (HCC)   Substance induced mood disorder (HCC)   Alcohol use disorder   Stimulant use disorder (cocaine)   Chronic health problem   Wernicke encephalopathy   Involuntary commitment    Plan   ## Psychiatric Medication Recommendations:  - Continue Zoloft  50 mg daily for depression - Continue Risperdal  0.5  mg nightly for auditory hallucinations  ## Medical Decision Making Capacity: Not specifically addressed in this encounter  ## Further Work-up:  -- TSH WNL, lipid panel: Cholesterol 231, LDL 141, A1c 9.5 -- most recent EKG on 5/28 had QtC of 485   ## Disposition:-- We recommend inpatient psychiatric hospitalization when medically cleared. Patient is under voluntary admission status at this time; please IVC if attempts to leave hospital.  ## Behavioral / Environmental: -Utilize compassion and acknowledge the patient's experiences while setting clear and realistic expectations for care.    ## Safety and Observation Level:  - Based on my clinical evaluation, I estimate the patient to be at moderate risk of self harm in the current setting. - At this time, we recommend  1:1 Observation. This decision is based on my review of the chart including patient's history and current presentation, interview of the patient, mental status examination, and consideration of suicide risk including evaluating suicidal ideation, plan, intent, suicidal or self-harm behaviors, risk factors, and protective factors. This judgment is based on our ability to directly address suicide risk, implement suicide prevention strategies, and develop a safety plan while the patient is in the clinical setting. Please contact our team if there is a concern that risk level has changed.  CSSR Risk Category:C-SSRS RISK CATEGORY: High Risk  Suicide Risk Assessment: Patient has following modifiable risk factors for suicide: untreated depression and recklessness, which we are addressing by medication management and recommendation to inpatient psychiatric hospitalization. Patient has following non-modifiable or demographic risk factors for suicide: history of suicide attempt and psychiatric hospitalization Patient has the following protective factors against suicide: no history of NSSIB  Thank you for this consult request.  Recommendations have been communicated to the primary team.  We will continue to follow until admitted at this time.   Roslynn Coombes, NP       History of Present Illness   Patient Report:  The patient was seen laying in bed, no acute distress. On assessment, the patient feels "good" today. When asked about speaking with family, she reports speaking to [place] last night.   Patient reports having good sleep.  Patient reports good appetite. Patient feels that the medications have been helpful with giving her a clear head and denies adverse effects.  Patient denies current SI, HI, AVH.  Psych ROS:  Depression: Reports persistent low mood, hypersomnia, feelings of hopelessness, poor energy and poor concentration Anxiety: Denies Mania (lifetime and current): Denies Psychosis: (lifetime and current): Reports auditory hallucinations  Collateral information:  Lahoma Pigg at [phone] on 5/28. No answer and did not have an option to leave a voicemail.  Review of Systems  Constitutional:  Negative for chills and fever.  Respiratory:  Negative for shortness of breath.   Cardiovascular:  Negative for chest pain and palpitations.  Gastrointestinal:  Negative for nausea and vomiting.  Neurological:  Negative for headaches.     Psychiatric and Social History  Psychiatric History:  Information collected from patient  Prev Dx/Sx: Reported schizophrenia and bipolar disorder Current Psych Provider: Denies Home Meds (current): Denies Previous  Med Trials: Abilify , quetiapine, Zoloft  Therapy: Denies  Prior Psych Hospitalization: Reports 3 previous times, most recently 10 years ago due to overdosing on medication Prior Self Harm: Denies Prior Violence: Denies  Family Psych History: Patient is adopted  Social History:  Educational Hx: 10th grade Occupational Hx: Unemployed Armed forces operational officer Hx: Denied Living Situation: Lives in boardinghouse and has a room Access to weapons/lethal means:  Denies  Substance History Alcohol: Drinks 1 pint of alcohol daily for the past 3 years, started drinking at 63 years old Last Drink prior to admission History of alcohol withdrawal seizures denies History of DT's denies Tobacco: Half a pack daily Illicit drugs: Daily cocaine use via inhalation Rehab hx: yes years ago  Exam Findings  Vital Signs:  Temp:  [97.9 F (36.6 C)-98.4 F (36.9 C)] 98 F (36.7 C) (06/01 0857) Pulse Rate:  [80-92] 86 (06/01 0857) Resp:  [16-20] 18 (06/01 0857) BP: (99-134)/(58-91) 99/58 (06/01 0857) SpO2:  [98 %-100 %] 100 % (06/01 0857) Blood pressure (!) 99/58, pulse 86, temperature 98 F (36.7 C), temperature source Oral, resp. rate 18, height 5\' 6"  (1.676 m), weight 65 kg, SpO2 100%. Body mass index is 23.13 kg/m.  Physical Exam Vitals reviewed.  Constitutional:      Appearance: Normal appearance.  HENT:     Head: Normocephalic and atraumatic.  Cardiovascular:     Rate and Rhythm: Normal rate.  Pulmonary:     Effort: Pulmonary effort is normal.  Neurological:     General: No focal deficit present.     Mental Status: She is alert.     Mental Status Exam: General Appearance: Fairly Groomed  Orientation:  Full (Time, Place, and Person)  Memory:  Remote;   Fair  Concentration:  Concentration: Fair  Recall:  Fair  Attention  Good  Eye Contact:  Fair  Speech:  Slow  Language:  Fair  Volume:  Decreased  Mood: "ok"  Affect:  Congruent and Restricted  Thought Process:  Coherent and Linear  Thought Content:  Logical  Suicidal Thoughts:  No  Homicidal Thoughts:  No  Judgement:  Fair  Insight:  Shallow  Psychomotor Activity:  Normal  Akathisia:  No  Fund of Knowledge:  Fair      Assets:  Communication Skills Desire for Improvement Resilience  Cognition:  WNL  ADL's:  Intact  AIMS (if indicated):        Other History   These have been pulled in through the EMR, reviewed, and updated if appropriate.  Family History:  The  patient's family history is not on file.  Medical History: Past Medical History:  Diagnosis Date   Depression    Diabetes mellitus without complication (HCC)    Hypertension    Nicotine  use 11/01/2023   Suicide attempt (HCC) 11/03/2023    Surgical History: Past Surgical History:  Procedure Laterality Date   CHOLECYSTECTOMY       Medications:   Current Facility-Administered Medications:    acetaminophen  (TYLENOL ) tablet 1,000 mg, 1,000 mg, Oral, Q6H PRN, 1,000 mg at 11/04/23 1657 **OR** acetaminophen  (TYLENOL ) suppository 650 mg, 650 mg, Rectal, Q6H PRN, Spence, Sarah, DO   enoxaparin  (LOVENOX ) injection 40 mg, 40 mg, Subcutaneous, Q24H, Spence, Sarah, DO, 40 mg at 11/04/23 2157   folic acid  (FOLVITE ) tablet 1 mg, 1 mg, Oral, Daily, Spence, Sarah, DO, 1 mg at 11/04/23 0102   glimepiride  (AMARYL ) tablet 1 mg, 1 mg, Oral, BID WC, Spence, Sarah, DO, 1 mg at 11/04/23 1625   insulin  aspart (  novoLOG ) injection 0-9 Units, 0-9 Units, Subcutaneous, TID WC, Shitarev, Dimitry, MD, 3 Units at 11/05/23 0622   multivitamin with minerals tablet 1 tablet, 1 tablet, Oral, Daily, Omar Bibber, DO, 1 tablet at 11/04/23 2841   nicotine  (NICODERM CQ  - dosed in mg/24 hours) patch 21 mg, 21 mg, Transdermal, Daily, [name], MD, 21 mg at 11/03/23 3244   Oral care mouth rinse, 15 mL, Mouth Rinse, PRN, McDiarmid, Demetra Filter, MD   risperiDONE  (RISPERDAL ) tablet 0.5 mg, 0.5 mg, Oral, QHS, Hoang, Daniela B, MD, 0.5 mg at 11/04/23 2157   sertraline  (ZOLOFT ) tablet 50 mg, 50 mg, Oral, Daily, Hoang, Daniela B, MD, 50 mg at 11/04/23 0102   thiamine  (VITAMIN B1) 500 mg in sodium chloride  0.9 % 50 mL IVPB, 500 mg, Intravenous, Q8H, [name], MD, Last Rate: 110 mL/hr at 11/05/23 0604, 500 mg at 11/05/23 0604  Allergies: Allergies  Allergen Reactions   Insulins     Suicide attempt on insulin  11/01/2023   Aspirin Nausea And Vomiting    Roslynn Coombes, PMHNP

## 2023-11-05 NOTE — Assessment & Plan Note (Signed)
 Stable. - Continue CBGs to with meals and at bedtime, sensitive SSI - Discontinue home Levemir  - start glimepiride  1 mg  - consider pioglitazone - diabetes coordinator will continue to follow

## 2023-11-05 NOTE — Assessment & Plan Note (Signed)
 1 pint of vodka daily, recently in rehab, will monitor for alcohol withdrawal. Ethanol of 0 on admission and no alcohol withdrawal symptoms for 48 hours so will discontinue CIWAs. - Folic acid 1 mg daily  - Multivitamin daily

## 2023-11-05 NOTE — Assessment & Plan Note (Signed)
 Completed IV thiamine  yesterday

## 2023-11-05 NOTE — Assessment & Plan Note (Signed)
 Pt requesting NRT while in hospital. Did not voice if she is interested in quitting or quantify use but said she uses highest dose patch in past.  - Continue nicotine  patch 21 mg once daily for NRT

## 2023-11-05 NOTE — Assessment & Plan Note (Signed)
 Treatment per inpatient psych. UDS positive on admission.

## 2023-11-05 NOTE — Assessment & Plan Note (Addendum)
 Stable.  Pending transfer. IVC'd - transfer to inpatient psychiatry pending - continue 1:1 telemonitoring - continue sertraline  50 mg once daily for MDD - continue risperdal  0.5 mg once at night for auditory hallucinations

## 2023-11-05 NOTE — Progress Notes (Addendum)
     Daily Progress Note Intern Pager: [phone]  Patient name: Robin Montgomery Medical record number: [id number] Date of birth: Dec 13, 1960 Age: 63 y.o. Gender: female  Primary Care Provider: Nida Barrow, Clinic Consultants: Psychiatry Code Status: Full  Pt Overview and Major Events to Date:  5-27-admitted  Assessment and Plan: 63 year old patient with hypoglycemia secondary to intentional insulin  overdose requiring IVC.  Medically stable and awaiting transfer to inpatient psychiatric hospital Assessment & Plan Wernicke encephalopathy Completed IV thiamine  yesterday Intentional overdose  MDD (major depressive disorder), recurrent severe, without psychosis (HCC) Stable.  Pending transfer. IVC'd - transfer to inpatient psychiatry pending - continue 1:1 telemonitoring - continue sertraline  50 mg once daily for MDD - continue risperdal  0.5 mg once at night for auditory hallucinations Diabetes mellitus type 2, insulin  dependent (HCC) Stable. - Continue CBGs to with meals and at bedtime, sensitive SSI - Discontinue home Levemir  - start glimepiride  1 mg  - consider pioglitazone - diabetes coordinator will continue to follow  Alcohol use disorder 1 pint of vodka daily, recently in rehab, will monitor for alcohol withdrawal. Ethanol of 0 on admission and no alcohol withdrawal symptoms for 48 hours so will discontinue CIWAs. - Folic acid  1 mg daily  - Multivitamin daily  Nicotine  use Pt requesting NRT while in hospital. Did not voice if she is interested in quitting or quantify use but said she uses highest dose patch in past.  - Continue nicotine  patch 21 mg once daily for NRT Stimulant use disorder (cocaine) Treatment per inpatient psych. UDS positive on admission.    FEN/GI: Carb modified PPx: Lovenox  Dispo: Transfer to [facility] when bed available  Subjective:  Awake, no concerns or complaints  Objective: Temp:  [97.9 F (36.6 C)-98.4 F (36.9 C)] 98.4 F (36.9 C) (06/01  0312) Pulse Rate:  [80-92] 89 (06/01 0312) Resp:  [16-20] 20 (06/01 0312) BP: (104-134)/(62-91) 104/71 (06/01 0312) SpO2:  [98 %-100 %] 98 % (06/01 0312) Physical Exam: General: Well-appearing, lying in bed, NAD Cardiovascular: RRR, normal S1/S2 Respiratory: CTAB, normal effort Abdomen: Soft, nontender nondistended Extremities: No edema BLEs Neuro: Alert, no focal deficits Psych: Mood and affect appropriate  Laboratory: Most recent CBC Lab Results  Component Value Date   WBC 10.0 11/01/2023   HGB 15.4 (H) 11/01/2023   HCT 45.4 11/01/2023   MCV 93.8 11/01/2023   PLT 344 11/01/2023   Most recent BMP    Latest Ref Rng & Units 11/02/2023    3:51 AM  BMP  Glucose 70 - 99 mg/dL 562   BUN 8 - 23 mg/dL 18   Creatinine 1.30 - 1.00 mg/dL 8.65   Sodium 784 - 696 mmol/L 134   Potassium 3.5 - 5.1 mmol/L 4.3   Chloride 98 - 111 mmol/L 103   CO2 22 - 32 mmol/L 25   Calcium  8.9 - 10.3 mg/dL 9.4     Glenn Lange, DO 11/05/2023, 5:42 AM  PGY-3, [facility] FPTS Intern pager: [phone], text pages welcome Secure chat group [facility] Teaching Service

## 2023-11-05 NOTE — Plan of Care (Signed)
   Problem: Health Behavior/Discharge Planning: Goal: Ability to manage health-related needs will improve Outcome: Progressing   Problem: Clinical Measurements: Goal: Will remain free from infection Outcome: Progressing

## 2023-11-06 ENCOUNTER — Other Ambulatory Visit (HOSPITAL_COMMUNITY): Payer: Self-pay

## 2023-11-06 DIAGNOSIS — Z751 Person awaiting admission to adequate facility elsewhere: Secondary | ICD-10-CM | POA: Diagnosis not present

## 2023-11-06 DIAGNOSIS — F332 Major depressive disorder, recurrent severe without psychotic features: Secondary | ICD-10-CM | POA: Diagnosis not present

## 2023-11-06 DIAGNOSIS — E162 Hypoglycemia, unspecified: Secondary | ICD-10-CM | POA: Diagnosis not present

## 2023-11-06 LAB — GLUCOSE, CAPILLARY
Glucose-Capillary: 218 mg/dL — ABNORMAL HIGH (ref 70–99)
Glucose-Capillary: 165 mg/dL — ABNORMAL HIGH (ref 70–99)
Glucose-Capillary: 182 mg/dL — ABNORMAL HIGH (ref 70–99)
Glucose-Capillary: 138 mg/dL — ABNORMAL HIGH (ref 70–99)

## 2023-11-06 NOTE — Consult Note (Signed)
 [facility] Psychiatric Consult Follow Up  Patient Name: .Robin Montgomery  MRN: [id number]  DOB: 1960/11/23  Consult Order details:  Orders (From admission, onward)     Start     Ordered   10/31/23 2034  IP CONSULT TO PSYCHIATRY       Ordering Provider: Rayma Calandra, DO  Provider:  (Not yet assigned)  Question Answer Comment  Location [facility]   Reason for Consult? 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  PTSD 3.  AUD 4. Stimulant use disorder, cocaine type  Assessment  Robin Montgomery is a 63 y.o. female admitted: Medicallyfor 10/31/2023 11:32 AM for AMS and noted to have low CBG with EMS to 31. She carries the psychiatric diagnoses of MDD, PTSD, AUD, and stimulant use d/o and has a past medical history of  HTN, IDT2DM, HLD.   Her current presentation of persistently low mood, hypersomnia, hopelessness, poor energy and concentration along with auditory hallucinations that are mood congruent in the context of substance use including regular alcohol, marijuana, and cocaine use is most consistent with substance-induced depression versus MDD with psychotic features.  Patient is not currently taking psychotropic medications and states that she has been off psychotropic medication for months.  Patient is currently in the precontemplative stage of stopping substance use.  She states that she uses substances to numb her emotions and I discussed the risks of concurrent drug use while taking psychotropic medications.  She is amenable to starting Zoloft  to aid with her depression and a low-dose of Risperdal  to aid with the auditory hallucinations.  While Risperdal  does carry a risk of metabolic syndrome, this  is dose-dependent and with the dose that we are starting on, there is a low risk at this time.  The hallucinations may stem from the chronic substance use and may get better as she is off substances.  However, I am concerned that she will likely continue using substances moving forward.  Patient does admit to suicide attempt by overdosing on her insulin  and she is agreeable to going to inpatient psychiatric hospitalization once medically cleared.   11/02/23 Patient is in good spirits this morning.  She denies SI, HI, and AVH and continues to be agreeable to going to inpatient psych once medically cleared which per primary is likely to be tomorrow morning.  Patient tolerating her medications well, denying EPS symptoms.  Patient's support system also visit her yesterday in which she said it was a therapeutic time.  11/03/2023 Patient now medically cleared to go to inpatient psychiatric hospital, awaiting bed availability. Patient is agreeable to go voluntarily.  11/05/2023 The client was very drowsy on assessment and stated she was "ok", sleep was "ok, and appetite as a "yeah".  No behavior issues per the sitter or complaints of withdrawal symptoms.  Yesterday, the client wanted to leave because she has an animal at home and this provider completed the IVC paperwork (in chart on the floor).  However, the social worker talked her into to continuing voluntarily.  She was in agreement still this am.   11/06/2023:  The patient was observed lying in bed, in no acute distress.  She was alert, smiling, and exhibited jovial affect throughout the interview, including when discussing her recent suicide attempt, lack of remorse, and desire to leave the hospital. She denied current suicidal ideation, homicidal ideation, or perceptual disturbances. When asked about her suicide attempt, she appeared indifferent and demonstrated limited insight into the severity of her actions. She expressed a desire to go home and refused  discussion about transfer to facilities outside of [place], despite being informed that discharge planning will depend on bed availability and level of care needed.  Her responses and demeanor indicate poor judgment and continued lack of insight into her psychiatric condition. Given the high lethality of her recent suicide attempt and her current minimization of risk, she meets criteria for involuntary commitment for further stabilization and to prevent deterioration of an acutely suicidal patient. Diagnoses:  Active Hospital problems: Active Problems:   Diabetes mellitus type 2, insulin  dependent (HCC)   Intentional overdose  MDD (major depressive disorder), recurrent severe, without psychosis (HCC)   Substance induced mood disorder (HCC)   Alcohol use disorder   Stimulant use disorder (cocaine)   Chronic health problem   Wernicke encephalopathy   Involuntary commitment   Severe episode of recurrent major depressive disorder, without psychotic features (HCC)    Plan   ## Psychiatric Medication Recommendations:  - Continue Zoloft  50 mg daily for depression - Continue Risperdal  0.5 mg nightly for auditory hallucinations  ## Medical Decision Making Capacity: Not specifically addressed in this encounter  ## Further Work-up:  -- TSH WNL, lipid panel: Cholesterol 231, LDL 141, A1c 9.5 -- most recent EKG on 5/28 had QtC of 485   ## Disposition:-- We recommend inpatient psychiatric hospitalization when medically cleared. Patient is under voluntary admission status at this time; please IVC if attempts to leave hospital.  ## Behavioral / Environmental: -Utilize compassion and acknowledge the patient's experiences while setting clear and realistic expectations for care.    ## Safety and Observation Level:  - Based on my clinical evaluation, I estimate the patient to be at moderate risk of self harm in the current setting. - At this time, we recommend  1:1 Observation. This decision is  based on my review of the chart including patient's history and current presentation, interview of the patient, mental status examination, and consideration of suicide risk including evaluating suicidal ideation, plan, intent, suicidal or self-harm behaviors, risk factors, and protective factors. This judgment is based on our ability to directly address suicide risk, implement suicide prevention strategies, and develop a safety plan while the patient is in the clinical setting. Please contact our team if there is a concern that risk level has changed.  CSSR Risk Category:C-SSRS RISK CATEGORY: High Risk  Suicide Risk Assessment: Patient has following modifiable risk factors for suicide: untreated depression and recklessness, which we are addressing by medication management and recommendation to inpatient psychiatric hospitalization. Patient has following non-modifiable or demographic risk factors for suicide: history of suicide attempt and psychiatric hospitalization Patient has the following protective factors against suicide: no history of NSSIB  Thank you for this consult request. Recommendations have been communicated to the primary team.  We will continue to follow until admitted at this time.   Rella Cardinal, FNP       History of Present Illness   Patient Report:  The patient was observed lying in bed without signs of acute distress. During the assessment, she reported feeling "good" today, endorsed having slept well, and noted a good appetite. She denied any  adverse effects from medications and denied current suicidal ideation, homicidal ideation, or auditory/visual hallucinations.  Despite this, the patient continues to display poor insight into the severity of her recent suicide attempt. She expressed no remorse for the attempt and appears content with her decision to end her life. She has been requesting discharge and, notably, attempted to bribe this provider in an effort to be  allowed to leave the hospital. This behavior further highlights impaired judgment and reinforces concern for her safety and decision-making capacity at this time.  Psych ROS:  Depression: Reports persistent low mood, hypersomnia, feelings of hopelessness, poor energy and poor concentration Anxiety: Denies Mania (lifetime and current): Denies Psychosis: (lifetime and current): Reports auditory hallucinations  Collateral information:  Lahoma Pigg at [phone] on 5/28. No answer and did not have an option to leave a voicemail.  Review of Systems  Constitutional:  Negative for chills and fever.  Respiratory:  Negative for shortness of breath.   Cardiovascular:  Negative for chest pain and palpitations.  Gastrointestinal:  Negative for nausea and vomiting.  Neurological:  Negative for headaches.     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 Living Situation: Lives in boardinghouse and has a room Access to weapons/lethal means: Denies  Substance History Alcohol: Drinks 1 pint of alcohol daily for the past 3 years, started drinking at 63 years old Last Drink prior to admission History of alcohol withdrawal seizures denies History of DT's denies Tobacco: Half a pack daily Illicit drugs: Daily cocaine use via inhalation Rehab hx: yes years ago  Exam Findings  Vital Signs:  Temp:  [97.5 F (36.4 C)-98.3 F (36.8 C)] 98.3 F (36.8 C) (06/02  1134) Pulse Rate:  [77-95] 90 (06/02 1134) Resp:  [18-20] 20 (06/02 0322) BP: (111-141)/(55-85) 122/70 (06/02 1134) SpO2:  [96 %-100 %] 100 % (06/02 0322) Blood pressure 122/70, pulse 90, temperature 98.3 F (36.8 C), temperature source Oral, resp. rate 20, height 5\' 6"  (1.676 m), weight 65 kg, SpO2 100%. Body mass index is 23.13 kg/m.  Physical Exam Vitals reviewed.  Constitutional:      Appearance: Normal appearance.  HENT:     Head: Normocephalic and atraumatic.  Cardiovascular:     Rate and Rhythm: Normal rate.  Pulmonary:     Effort: Pulmonary effort is normal.  Neurological:     General: No focal deficit present.     Mental Status: She is alert.     Mental Status Exam: General Appearance: Fairly Groomed  Orientation:  Full (Time, Place, and Person)  Memory:  Remote;   Fair  Concentration:  Concentration: Fair  Recall:  Fair  Attention  Good  Eye Contact:  Fair  Speech:  Normal Rate  Language:  Fair  Volume:  Decreased  Mood: "ok"  Affect:  Congruent and Restricted  Thought Process:  Coherent and Linear  Thought Content:  Logical  Suicidal Thoughts:  No  Homicidal Thoughts:  No  Judgement:  Fair  Insight:  Shallow  Psychomotor Activity:  Normal  Akathisia:  No  Fund of Knowledge:  Fair      Assets:  Manufacturing systems engineer Desire for Improvement Resilience  Cognition:  WNL  ADL's:  Intact  AIMS (if indicated):        Other History   These have been pulled in through the EMR, reviewed, and updated if appropriate.  Family History:  The patient's family history is not on file.  Medical History: Past Medical History:  Diagnosis Date  . Depression   . Diabetes mellitus without complication (HCC)   . Hypertension   . Nicotine  use 11/01/2023  . Suicide attempt (HCC) 11/03/2023    Surgical History: Past Surgical History:  Procedure Laterality Date  . CHOLECYSTECTOMY       Medications:   Current Facility-Administered Medications:  .   acetaminophen  (TYLENOL ) tablet 1,000 mg, 1,000 mg, Oral, Q6H PRN, 1,000 mg at 11/05/23 2136 **OR** acetaminophen  (TYLENOL ) suppository 650 mg, 650 mg, Rectal, Q6H PRN, Spence, Sarah, DO .  enoxaparin  (LOVENOX ) injection 40 mg, 40 mg, Subcutaneous, Q24H, Spence, Sarah, DO, 40 mg at 11/05/23 2136 .  folic acid  (FOLVITE ) tablet 1 mg, 1 mg, Oral, Daily, Spence, Sarah, DO, 1 mg at 11/06/23 0951 .  glimepiride  (AMARYL ) tablet 1 mg, 1 mg, Oral, BID WC, Spence, Sarah, DO, 1 mg at 11/06/23 0950 .  insulin  aspart (novoLOG ) injection 0-9 Units, 0-9 Units, Subcutaneous, TID WC, Shitarev, Dimitry, MD, 2 Units at 11/06/23 1304 .  multivitamin with minerals tablet 1 tablet, 1 tablet, Oral, Daily, Omar Bibber, DO, 1 tablet at 11/06/23 0950 .  nicotine  (NICODERM CQ  - dosed in mg/24 hours) patch 21 mg, 21 mg, Transdermal, Daily, McCarty, Artie, MD, 21 mg at 11/06/23 0950 .  Oral care mouth rinse, 15 mL, Mouth Rinse, PRN, McDiarmid, Demetra Filter, MD .  risperiDONE  (RISPERDAL ) tablet 0.5 mg, 0.5 mg, Oral, QHS, Hoang, Daniela B, MD, 0.5 mg at 11/05/23 2136 .  sertraline  (ZOLOFT ) tablet 50 mg, 50 mg, Oral, Daily, Hoang, Daniela B, MD, 50 mg at 11/06/23 0950 .  thiamine  (VITAMIN B1) tablet 100 mg, 100 mg, Oral, Daily, Edison Gore, MD, 100 mg at 11/06/23 0951  Allergies: Allergies  Allergen Reactions  . Insulins     Suicide attempt on insulin  11/01/2023  . Aspirin Nausea And Vomiting    Roslynn Coombes, PMHNP

## 2023-11-06 NOTE — Assessment & Plan Note (Signed)
 Stable.  Pending transfer. IVC'd - Transfer to inpatient psychiatry pending - Continue 1:1 telemonitoring - Continue Sertraline  50 mg once daily for MDD - Continue Risperdal  0.5 mg once at night for auditory hallucinations

## 2023-11-06 NOTE — Assessment & Plan Note (Addendum)
 Treatment per inpatient psych. UDS positive on admission.

## 2023-11-06 NOTE — Progress Notes (Signed)
 Mobility Specialist Progress Note:   11/06/23 0917  Mobility  Activity Ambulated independently in hallway;Ambulated independently in room  Level of Assistance Independent  Assistive Device None  Distance Ambulated (ft) 400 ft  Activity Response Tolerated well  Mobility Referral Yes  Mobility visit 1 Mobility  Mobility Specialist Start Time (ACUTE ONLY) 0900  Mobility Specialist Stop Time (ACUTE ONLY) 0906  Mobility Specialist Time Calculation (min) (ACUTE ONLY) 6 min   Pt received in bed, agreeable to mobility session. Ambulated independently in hallway, no AD required. Tolerated well, asx throughout. Returned pt to room, left with all needs met. Sitter in room.   Clovia Reine Mobility Specialist Please contact via Special educational needs teacher or  Rehab office at [phone]

## 2023-11-06 NOTE — TOC CM/SW Note (Signed)
 Per benefits check on insulin  done by RX Patient advocate team-  Humalog Kwikpen copay is $4.00, Levemir  is saying no longer covered under pts' plan  DM educator may need to look at alternate recommendation for Levemir  due to out of pocket cost to patient.

## 2023-11-06 NOTE — Assessment & Plan Note (Signed)
 Stable.  Continued home Levemir  due to suicide attempt with insulin . - Continue CBGs to with meals and at bedtime, sensitive SSI - Continue Glimepiride  1 mg  - Consider pioglitazone - Diabetes coordinator will continue to follow

## 2023-11-06 NOTE — Assessment & Plan Note (Signed)
 Completed IV thiamine  and transitioned to PO.

## 2023-11-06 NOTE — Progress Notes (Signed)
     Daily Progress Note Intern Pager: [phone]  Patient name: Robin Montgomery Medical record number: [id number] Date of birth: [birth year] Age: 63 y.o. Gender: female  Primary Care Provider: Nida Barrow, Clinic Consultants: Psychiatry Code Status: Full code  Pt Overview and Major Events to Date:  5/27: Admitted  Assessment and Plan: Robin Montgomery is a 63 year old female with PMH of depression, T2DM, HTN, and polysubstance abuse who presents with AMS and hypoglycemia secondary to insulin  overdose for attempted suicide. Assessment & Plan Wernicke encephalopathy Completed IV thiamine  and transitioned to PO. Intentional overdose  MDD (major depressive disorder), recurrent severe, without psychosis (HCC) Stable.  Pending transfer. IVC'd - Transfer to inpatient psychiatry pending - Continue 1:1 telemonitoring - Continue Sertraline  50 mg once daily for MDD - Continue Risperdal  0.5 mg once at night for auditory hallucinations Diabetes mellitus type 2, insulin  dependent (HCC) Stable.  Continued home Levemir  due to suicide attempt with insulin . - Continue CBGs to with meals and at bedtime, sensitive SSI - Continue Glimepiride  1 mg  - Consider pioglitazone - Diabetes coordinator will continue to follow  Alcohol use disorder 1 pint of vodka daily, recently in rehab, will monitor for alcohol withdrawal. Ethanol of 0 on admission and no alcohol withdrawal symptoms for 48 hours during admission, so CIWAs discontinued - Folic acid  1 mg daily  - Multivitamin daily  Stimulant use disorder (cocaine) Treatment per inpatient psych. UDS positive on admission.  Chronic health problem HTN: normotensive during admission  FEN/GI: Carb modified PPx: Lovenox  Dispo: Transfer to [facility] when bed available  Subjective:  Patient is doing well today and in high spirits.  No concerns at this time but is eager to leave.  Continues to await a bed at [facility]  Objective: Temp:  [97.5 F (36.4 C)-98.3 F (36.8 C)]  98.3 F (36.8 C) (06/02 1134) Pulse Rate:  [77-95] 90 (06/02 1134) Resp:  [18-20] 20 (06/02 0322) BP: (111-141)/(55-85) 122/70 (06/02 1134) SpO2:  [96 %-100 %] 100 % (06/02 0322) Physical Exam: General: Awake and Alert in NAD HEENT: NCAT. Sclera anicteric. No rhinorrhea. Cardiovascular: RRR. No M/R/G Respiratory: CTAB, normal WOB on RA. No wheezing, crackles, rhonchi, or diminished breath sounds. Abdomen: Soft, non-tender, non-distended. Bowel sounds normoactive Extremities: Able to move all extremities. No BLE edema, no deformities or significant joint findings. Skin: Warm and dry. No abrasions or rashes noted. Neuro: No focal neurological deficits.  Laboratory: Most recent CBC Lab Results  Component Value Date   WBC 10.0 11/01/2023   HGB 15.4 (H) 11/01/2023   HCT 45.4 11/01/2023   MCV 93.8 11/01/2023   PLT 344 11/01/2023   Most recent BMP    Latest Ref Rng & Units 11/02/2023    3:51 AM  BMP  Glucose 70 - 99 mg/dL 147   BUN 8 - 23 mg/dL 18   Creatinine 8.29 - 1.00 mg/dL 5.62   Sodium 130 - 865 mmol/L 134   Potassium 3.5 - 5.1 mmol/L 4.3   Chloride 98 - 111 mmol/L 103   CO2 22 - 32 mmol/L 25   Calcium  8.9 - 10.3 mg/dL 9.4    Imaging/Diagnostic Tests: No new imaging.  Clyda Dark, DO 11/06/2023, 12:43 PM  PGY-1, [facility] FPTS Intern pager: [phone], text pages welcome Secure chat group [facility] Teaching Service

## 2023-11-06 NOTE — TOC Progression Note (Signed)
 Transition of Care [facility]) - Progression Note    Patient Details  Name: Robin Montgomery MRN: [id number] Date of Birth: 1960-09-16  Transition of [facility]) CM/SW Contact  [name], [place] Phone Number: 11/06/2023, 12:20 PM  Clinical Narrative:     Referral for  [facility] placement has been sent- patient has no bed offer at this time.   [facility] -declined states they unable to met her medical needs  [facility]- declined -states unable to meet her needs [facility]- no bed available   TOC will continue to follow and assist with discharge planning.  Liddie Reel, MSW, LCSW Clinical Social Worker    Expected Discharge Plan: Psychiatric Hospital Barriers to Discharge: Continued Medical Work up (psych placement)  Expected Discharge Plan and Services In-house Referral: Clinical Social Work     Living arrangements for the past 2 months: Single Family Home                                       Social Determinants of Health (SDOH) Interventions SDOH Screenings   Food Insecurity: Patient Declined (11/01/2023)  Housing: Low Risk  (11/01/2023)  Transportation Needs: No Transportation Needs (11/01/2023)  Utilities: Not At Risk (11/01/2023)  Depression (PHQ2-9): Medium Risk (02/28/2022)  Financial Resource Strain: Medium Risk (01/19/2021)  Social Connections: Unknown (10/19/2021)   Received from [facility]  Stress: Stress Concern Present (01/19/2021)  Tobacco Use: High Risk (10/31/2023)    Readmission Risk Interventions    11/19/2022    8:43 AM  Readmission Risk Prevention Plan  Transportation Screening Complete  PCP or Specialist Appt within 5-7 Days Complete  Home Care Screening Complete  Medication Review (RN CM) Complete

## 2023-11-06 NOTE — Assessment & Plan Note (Signed)
 1 pint of vodka daily, recently in rehab, will monitor for alcohol withdrawal. Ethanol of 0 on admission and no alcohol withdrawal symptoms for 48 hours during admission, so CIWAs discontinued - Folic acid  1 mg daily  - Multivitamin daily

## 2023-11-06 NOTE — Progress Notes (Signed)
 Physical Therapy Treatment Patient Details Name: Robin Montgomery MRN: [id number] DOB: April 15, 1961 Today's Date: 11/06/2023   History of Present Illness Pt is a 63 yr old female presented 10/31/23 due to AMS and hypoglycemia. Pt reports intentionally overdosing on her insulin  5/27 in an attempt to end her life. She also reports being clean/sober for 2 years however she had alcohol and did crack/cocaine yesterday as well. Reports not knowing her trigger. PMH: T2DM, polysubstance abuse, major depressive disorder, HTN    PT Comments  Pt resting in bed on arrival and agreeable to session with good progress towards acute goals. Pt with improved balance this session, scoring 21/24 on DGI indicating pt at low risk for falls, however pt continues to be impulsive at times needing increased cues for general safety with mobility as well as max cues for attention to task at hand. Pt demonstrating safe stair ascent/descent with supervision provided for safety. Plan to continue to assess/address high level balance as pt continues to benefit from skilled PT services to progress toward functional mobility goals.     If plan is discharge home, recommend the following: A little help with walking and/or transfers;A little help with bathing/dressing/bathroom;Assist for transportation;Help with stairs or ramp for entrance   Can travel by private vehicle        Equipment Recommendations  None recommended by PT    Recommendations for Other Services       Precautions / Restrictions Precautions Precautions: Fall Restrictions Weight Bearing Restrictions Per Provider Order: No     Mobility  Bed Mobility Overal bed mobility: Independent                  Transfers Overall transfer level: Needs assistance Equipment used: None Transfers: Sit to/from Stand, Bed to chair/wheelchair/BSC Sit to Stand: Supervision           General transfer comment: supervision for safety     Ambulation/Gait Ambulation/Gait assistance: Supervision Gait Distance (Feet): 400 Feet Assistive device: None Gait Pattern/deviations: Step-through pattern, Decreased stride length, Wide base of support, Shuffle Gait velocity: dec     General Gait Details: pt with wide base of support, no LOB noted, pt able to accept mild balance challenges with increased postural sway   Stairs Stairs: Yes Stairs assistance: Supervision Stair Management: One rail Left, Alternating pattern, Forwards Number of Stairs: 10 General stair comments: up/down withuot fault   Wheelchair Mobility     Tilt Bed    Modified Rankin (Stroke Patients Only)       Balance Overall balance assessment: Mild deficits observed, not formally tested                               Standardized Balance Assessment Standardized Balance Assessment : Dynamic Gait Index   Dynamic Gait Index Level Surface: Normal Change in Gait Speed: Mild Impairment Gait with Horizontal Head Turns: Mild Impairment Gait with Vertical Head Turns: Mild Impairment Gait and Pivot Turn: Normal Step Over Obstacle: Normal Step Around Obstacles: Normal Steps: Normal Total Score: 21      Communication Communication Communication: Impaired Factors Affecting Communication: Reduced clarity of speech  Cognition Arousal: Alert Behavior During Therapy: Impulsive   PT - Cognitive impairments: No family/caregiver present to determine baseline                       PT - Cognition Comments: pt with known depression, better affect this session, increased  motivation to participate, can be VERY impulsive Following commands: Intact      Cueing Cueing Techniques: Verbal cues  Exercises      General Comments General comments (skin integrity, edema, etc.): VSS on RA      Pertinent Vitals/Pain      Home Living                          Prior Function            PT Goals (current goals can now be  found in the care plan section) Acute Rehab PT Goals Patient Stated Goal: to go home PT Goal Formulation: With patient Time For Goal Achievement: 11/15/23 Progress towards PT goals: Progressing toward goals    Frequency    Min 2X/week      PT Plan      Co-evaluation              AM-PAC PT "6 Clicks" Mobility   Outcome Measure  Help needed turning from your back to your side while in a flat bed without using bedrails?: None Help needed moving from lying on your back to sitting on the side of a flat bed without using bedrails?: None Help needed moving to and from a bed to a chair (including a wheelchair)?: None Help needed standing up from a chair using your arms (e.g., wheelchair or bedside chair)?: A Little Help needed to walk in hospital room?: A Little Help needed climbing 3-5 steps with a railing? : A Little 6 Click Score: 21    End of Session   Activity Tolerance: Patient tolerated treatment well Patient left: with call bell/phone within reach;with nursing/sitter in room;in bed Nurse Communication: Mobility status PT Visit Diagnosis: Unsteadiness on feet (R26.81)     Time: 2831-5176 PT Time Calculation (min) (ACUTE ONLY): 10 min  Charges:    $Gait Training: 8-22 mins PT General Charges $$ ACUTE PT VISIT: 1 Visit                     Beverly Buckler. PTA Acute Rehabilitation Services Office: [phone]   Robin Montgomery 11/06/2023, 3:52 PM

## 2023-11-06 NOTE — Assessment & Plan Note (Signed)
 HTN: normotensive during admission

## 2023-11-07 ENCOUNTER — Other Ambulatory Visit: Payer: Self-pay

## 2023-11-07 ENCOUNTER — Inpatient Hospital Stay
Admission: AD | Admit: 2023-11-07 | Discharge: 2023-11-12 | DRG: 885 | Disposition: A | Discharge status: Home / Self Care | Payer: MEDICAID | Source: Intra-hospital | Attending: Psychiatry | Admitting: Psychiatry

## 2023-11-07 ENCOUNTER — Encounter: Payer: Self-pay | Admitting: Behavioral Health

## 2023-11-07 DIAGNOSIS — F4310 Post-traumatic stress disorder, unspecified: Secondary | ICD-10-CM | POA: Diagnosis present

## 2023-11-07 DIAGNOSIS — T383X1A Poisoning by insulin and oral hypoglycemic [antidiabetic] drugs, accidental (unintentional), initial encounter: Secondary | ICD-10-CM

## 2023-11-07 DIAGNOSIS — Z79899 Other long term (current) drug therapy: Secondary | ICD-10-CM | POA: Diagnosis not present

## 2023-11-07 DIAGNOSIS — F17210 Nicotine dependence, cigarettes, uncomplicated: Secondary | ICD-10-CM | POA: Diagnosis present

## 2023-11-07 DIAGNOSIS — F1090 Alcohol use, unspecified, uncomplicated: Secondary | ICD-10-CM | POA: Diagnosis not present

## 2023-11-07 DIAGNOSIS — E119 Type 2 diabetes mellitus without complications: Secondary | ICD-10-CM | POA: Diagnosis present

## 2023-11-07 DIAGNOSIS — F332 Major depressive disorder, recurrent severe without psychotic features: Secondary | ICD-10-CM | POA: Diagnosis present

## 2023-11-07 DIAGNOSIS — Z794 Long term (current) use of insulin: Secondary | ICD-10-CM

## 2023-11-07 DIAGNOSIS — I10 Essential (primary) hypertension: Secondary | ICD-10-CM | POA: Diagnosis present

## 2023-11-07 DIAGNOSIS — Z7984 Long term (current) use of oral hypoglycemic drugs: Secondary | ICD-10-CM

## 2023-11-07 DIAGNOSIS — E162 Hypoglycemia, unspecified: Secondary | ICD-10-CM | POA: Diagnosis not present

## 2023-11-07 DIAGNOSIS — F1020 Alcohol dependence, uncomplicated: Secondary | ICD-10-CM

## 2023-11-07 DIAGNOSIS — F1510 Other stimulant abuse, uncomplicated: Secondary | ICD-10-CM | POA: Diagnosis present

## 2023-11-07 DIAGNOSIS — E785 Hyperlipidemia, unspecified: Secondary | ICD-10-CM | POA: Diagnosis present

## 2023-11-07 DIAGNOSIS — Z9151 Personal history of suicidal behavior: Secondary | ICD-10-CM | POA: Diagnosis not present

## 2023-11-07 DIAGNOSIS — F1010 Alcohol abuse, uncomplicated: Secondary | ICD-10-CM | POA: Diagnosis present

## 2023-11-07 DIAGNOSIS — Z046 Encounter for general psychiatric examination, requested by authority: Secondary | ICD-10-CM | POA: Diagnosis not present

## 2023-11-07 LAB — GLUCOSE, CAPILLARY
Glucose-Capillary: 248 mg/dL — ABNORMAL HIGH (ref 70–99)
Glucose-Capillary: 162 mg/dL — ABNORMAL HIGH (ref 70–99)
Glucose-Capillary: 126 mg/dL — ABNORMAL HIGH (ref 70–99)
Glucose-Capillary: 169 mg/dL — ABNORMAL HIGH (ref 70–99)

## 2023-11-07 LAB — CREATININE, SERUM
Creatinine, Ser: 0.54 mg/dL (ref 0.44–1.00)
GFR, Estimated: >60 mL/min (ref >60)

## 2023-11-07 LAB — RESP PANEL BY RT-PCR (RSV, FLU A&B, COVID)  RVPGX2
Influenza A by PCR: NEGATIVE
Influenza B by PCR: NEGATIVE
Resp Syncytial Virus by PCR: NEGATIVE
SARS Coronavirus 2 by RT PCR: NEGATIVE

## 2023-11-07 MED ORDER — SERTRALINE HCL 50 MG PO TABS
50.0000 mg | ORAL_TABLET | Freq: Every day | ORAL | Status: DC
  Administered 2023-11-08 – 2023-11-12 (×5): 50 mg via ORAL
  Filled 2023-11-07 (×5): qty 1

## 2023-11-07 MED ORDER — OLANZAPINE 10 MG IM SOLR: 10.0000 mg | Freq: Two times a day (BID) | INTRAMUSCULAR | Status: DC | PRN

## 2023-11-07 MED ORDER — RISPERIDONE 1 MG PO TABS
0.5000 mg | ORAL_TABLET | Freq: Every day | ORAL | Status: DC
  Administered 2023-11-07: 0.5 mg via ORAL
  Filled 2023-11-07: qty 1

## 2023-11-07 MED ORDER — ALUM & MAG HYDROXIDE-SIMETH 200-200-20 MG/5ML PO SUSP: 30.0000 mL | ORAL | Status: DC | PRN

## 2023-11-07 MED ORDER — ACETAMINOPHEN 325 MG PO TABS
650.0000 mg | ORAL_TABLET | Freq: Four times a day (QID) | ORAL | Status: DC | PRN
  Administered 2023-11-08 – 2023-11-12 (×7): 650 mg via ORAL
  Filled 2023-11-07 (×8): qty 2

## 2023-11-07 MED ORDER — GLIMEPIRIDE 1 MG PO TABS
1.0000 mg | ORAL_TABLET | Freq: Two times a day (BID) | ORAL | Status: DC
  Administered 2023-11-08 – 2023-11-12 (×8): 1 mg via ORAL
  Filled 2023-11-07 (×11): qty 1

## 2023-11-07 MED ORDER — MAGNESIUM HYDROXIDE 400 MG/5ML PO SUSP: 30.0000 mL | Freq: Every day | ORAL | Status: DC | PRN

## 2023-11-07 MED ORDER — INSULIN ASPART 100 UNIT/ML IJ SOLN
0.0000 [IU] | Freq: Three times a day (TID) | INTRAMUSCULAR | Status: DC
  Administered 2023-11-08: 2 [IU] via SUBCUTANEOUS
  Administered 2023-11-09 – 2023-11-12 (×5): 3 [IU] via SUBCUTANEOUS
  Filled 2023-11-07 (×2): qty 1

## 2023-11-07 MED ORDER — OLANZAPINE 5 MG PO TABS: 10.0000 mg | ORAL_TABLET | Freq: Two times a day (BID) | ORAL | Status: DC | PRN

## 2023-11-07 MED ORDER — VITAMIN B-1 100 MG PO TABS
100.0000 mg | ORAL_TABLET | Freq: Every day | ORAL | 0 refills | Status: AC
  Filled 2023-11-07: qty 30, 30d supply, fill #0

## 2023-11-07 MED ORDER — NICOTINE 21 MG/24HR TD PT24
21.0000 mg | MEDICATED_PATCH | Freq: Every day | TRANSDERMAL | Status: DC
  Administered 2023-11-08 – 2023-11-12 (×5): 21 mg via TRANSDERMAL
  Filled 2023-11-07 (×5): qty 1

## 2023-11-07 MED ORDER — ADULT MULTIVITAMIN W/MINERALS CH
1.0000 | ORAL_TABLET | Freq: Every day | ORAL | Status: DC
  Administered 2023-11-08 – 2023-11-12 (×5): 1 via ORAL
  Filled 2023-11-07 (×5): qty 1

## 2023-11-07 MED ORDER — FOLIC ACID 1 MG PO TABS
1.0000 mg | ORAL_TABLET | Freq: Every day | ORAL | Status: DC
  Administered 2023-11-07 – 2023-11-12 (×6): 1 mg via ORAL
  Filled 2023-11-07 (×6): qty 1

## 2023-11-07 NOTE — Progress Notes (Signed)
 Admission Note:  63 yr old AA female who presents voluntary in no acute distress for the treatment of SI, substance abuse and depression. Pt appears in bright spirits, she was calm and cooperative with admission process. Pt denies SI and contracts for safety, she denied that her misuse of insulin  was intentional but reported two previous suicide attempts via overdose. Pt  also denies AVH, Skin was assessed and found to be intact, she had noted bruises to left forearm due to IV therapy prior to transfer and old scars to lower extremities and tattooto left thigh.  Pt searched and no contraband found, POC and unit policies explained and understanding verbalized. Consents obtained. Food and fluids offered, and  accepted. Pt had no additional questions or concerns.

## 2023-11-07 NOTE — Consult Note (Signed)
 [facility] Psychiatric Consult Follow Up  Patient Name: .Robin Montgomery  MRN: [id number]  DOB: Apr 02, 1961  Consult Order details:  Orders (From admission, onward)     Start     Ordered   10/31/23 2034  IP CONSULT TO PSYCHIATRY       Ordering Provider: Rayma Calandra, DO  Provider:  (Not yet assigned)  Question Answer Comment  Location [facility]   Reason for Consult? intentional insulin  overdose      10/31/23 2033             Mode of Visit: In person    Psychiatry Consult Evaluation  Service Date: November 07, 2023 LOS:  LOS: 7 days  Chief Complaint tried to hurt myself  Primary Psychiatric Diagnoses  MDD with psychotic features vs substance induced depression 2.  PTSD 3.  AUD 4. Stimulant use disorder, cocaine type  Assessment  Robin Montgomery is a 63 y.o. female admitted: Medicallyfor 10/31/2023 11:32 AM for AMS and noted to have low CBG with EMS to 31. She carries the psychiatric diagnoses of MDD, PTSD, AUD, and stimulant use d/o and has a past medical history of  HTN, IDT2DM, HLD.   Her current presentation of persistently low mood, hypersomnia, hopelessness, poor energy and concentration along with auditory hallucinations that are mood congruent in the context of substance use including regular alcohol, marijuana, and cocaine use is most consistent with substance-induced depression versus MDD with psychotic features.  Patient is not currently taking psychotropic medications and states that she has been off psychotropic medication for months.  Patient is currently in the precontemplative stage of stopping substance use.  She states that she uses substances to numb her emotions and I discussed the risks of concurrent drug use while taking psychotropic medications.  She is amenable to starting Zoloft  to aid with her depression and a low-dose of Risperdal  to aid with the auditory hallucinations.  While Risperdal  does carry a risk of metabolic syndrome, this  is dose-dependent and with the dose that we are starting on, there is a low risk at this time.  The hallucinations may stem from the chronic substance use and may get better as she is off substances.  However, I am concerned that she will likely continue using substances moving forward.  Patient does admit to suicide attempt by overdosing on her insulin  and she is agreeable to going to inpatient psychiatric hospitalization once medically cleared.   11/02/23 Patient is in good spirits this morning.  She denies SI, HI, and AVH and continues to be agreeable to going to inpatient psych once medically cleared which per primary is likely to be tomorrow morning.  Patient tolerating her medications well, denying EPS symptoms.  Patient's support system also visit her yesterday in which she said it was a therapeutic time.  11/03/2023 Patient now medically cleared to go to inpatient psychiatric hospital, awaiting bed availability. Patient is agreeable to go voluntarily.  11/05/2023 The client was very drowsy on assessment and stated she was "ok", sleep was "ok, and appetite as a "yeah".  No behavior issues per the sitter or complaints of withdrawal symptoms.  Yesterday, the client wanted to leave because she has an animal at home and this provider completed the IVC paperwork (in chart on the floor).  However, the social worker talked her into to continuing voluntarily.  She was in agreement still this am.   11/06/2023:  The patient was observed lying in bed, in no acute distress.  She was alert, smiling, and exhibited jovial affect throughout the interview, including when discussing her recent suicide attempt, lack of remorse, and desire to leave the hospital. She denied current suicidal ideation, homicidal ideation, or perceptual disturbances. When asked about her suicide attempt, she appeared indifferent and demonstrated limited insight into the severity of her actions. She expressed a desire to go home and refused  discussion about transfer to facilities outside of [place], despite being informed that discharge planning will depend on bed availability and level of care needed.  Her responses and demeanor indicate poor judgment and continued lack of insight into her psychiatric condition. Given the high lethality of her recent suicide attempt and her current minimization of risk, she meets criteria for involuntary commitment for further stabilization and to prevent deterioration of an acutely suicidal patient.  11/07/2023: The patient was seen in followup because she was refusing to go to [facility] on a voluntary status claiming that she wanted to go home. When seen and evaluated, the patient was mildly agitated, labile and claims that she has a cat at home and that nobody was there to take care of the cat. However she had no objection to going to a local hospital in [place]. She then proceeded to call a person named "Arlo Lama" supposedly her boyfriend. He proceeded to get labile, loud and stating that he was upset that she has to go" that far and why the hospital cannot admit her in [place]"  then claimed that " I will cuss somebody if the are not going to release her".   Robin Montgomery understand the reasons for the admission and has agreed to go voluntarily to [facility] for admission to the [facility] unit.   Diagnoses:  Active Hospital problems: Active Problems:   Diabetes mellitus type 2, insulin  dependent (HCC)   Intentional overdose  MDD (major depressive disorder), recurrent severe, without psychosis (HCC)   Substance induced mood disorder (HCC)   Alcohol use disorder   Stimulant use disorder (cocaine)   Chronic health problem   Wernicke encephalopathy   Involuntary commitment   Severe episode of recurrent major depressive disorder, without psychotic features (HCC)   Encounter for person awaiting admission to psychiatric care setting   Insulin  overdose    Plan   ## Psychiatric Medication  Recommendations:  - Continue Zoloft  50 mg daily for depression - Continue Risperdal  0.5 mg nightly for auditory hallucinations  ## Medical Decision Making Capacity: Not specifically addressed in this encounter  ## Further Work-up:  -- TSH WNL, lipid panel: Cholesterol 231, LDL 141, A1c 9.5 -- most recent EKG on 5/28 had QtC of 485   ## Disposition:-- We recommend inpatient psychiatric hospitalization when medically cleared. Patient is under voluntary admission status at this time; please IVC if attempts to leave hospital.  ## Behavioral / Environmental: -Utilize compassion and acknowledge the patient's experiences while setting clear and realistic expectations for care.    ## Safety and Observation Level:  - Based on my clinical evaluation, I estimate the patient to be at moderate risk of self harm in the current setting. - At this time, we recommend  1:1 Observation. This decision is based on my review of the chart including patient's history and current presentation, interview of the patient, mental status examination, and consideration of suicide risk including evaluating suicidal ideation, plan, intent, suicidal or self-harm behaviors, risk factors, and protective factors. This judgment is based on our ability to directly address suicide risk, implement suicide prevention strategies, and develop a  safety plan while the patient is in the clinical setting. Please contact our team if there is a concern that risk level has changed.  CSSR Risk Category:C-SSRS RISK CATEGORY: High Risk  Suicide Risk Assessment: Patient has following modifiable risk factors for suicide: untreated depression and recklessness, which we are addressing by medication management and recommendation to inpatient psychiatric hospitalization. Patient has following non-modifiable or demographic risk factors for suicide: history of suicide attempt and psychiatric hospitalization Patient has the following protective factors  against suicide: no history of NSSIB  Thank you for this consult request. Recommendations have been communicated to the primary team.  We will continue to follow until admitted at this time.   Buzz Cass, MD       History of Present Illness   Patient Report:  The patient was observed lying in bed without signs of acute distress. During the assessment, she reported feeling "good" today, endorsed having slept well, and noted a good appetite. She denied any adverse effects from medications and denied current suicidal ideation, homicidal ideation, or auditory/visual hallucinations.  Despite this, the patient continues to display poor insight into the severity of her recent suicide attempt. She expressed no remorse for the attempt and appears content with her decision to end her life. She has been requesting discharge and, notably, attempted to bribe this provider in an effort to be allowed to leave the hospital. This behavior further highlights impaired judgment and reinforces concern for her safety and decision-making capacity at this time.  Psych ROS:  Depression: Reports persistent low mood, hypersomnia, feelings of hopelessness, poor energy and poor concentration Anxiety: Denies Mania (lifetime and current): Denies Psychosis: (lifetime and current): Reports auditory hallucinations  Collateral information:  Lahoma Pigg at [phone] on 5/28. No answer and did not have an option to leave a voicemail.  Review of Systems  Constitutional:  Negative for chills and fever.  Respiratory:  Negative for shortness of breath.   Cardiovascular:  Negative for chest pain and palpitations.  Gastrointestinal:  Negative for nausea and vomiting.  Neurological:  Negative for headaches.     Psychiatric and Social History  Psychiatric History:  Information collected from patient  Prev Dx/Sx: Reported schizophrenia and bipolar disorder Current Psych Provider: Denies Home Meds  (current): Denies Previous Med Trials: Abilify , quetiapine, Zoloft  Therapy: Denies  Prior Psych Hospitalization: Reports 3 previous times, most recently 10 years ago due to overdosing on medication Prior Self Harm: Denies Prior Violence: Denies  Family Psych History: Patient is adopted  Social History:  Educational Hx: 10th grade Occupational Hx: Unemployed Armed forces operational officer Hx: Denied Living Situation: Lives in boardinghouse and has a room Access to weapons/lethal means: Denies  Substance History Alcohol: Drinks 1 pint of alcohol daily for the past 3 years, started drinking at 63 years old Last Drink prior to admission History of alcohol withdrawal seizures denies History of DT's denies Tobacco: Half a pack daily Illicit drugs: Daily cocaine use via inhalation Rehab hx: yes years ago  Exam Findings  Vital Signs:  Temp:  [97.1 F (36.2 C)-98.1 F (36.7 C)] 97.6 F (36.4 C) (06/03 0849) Pulse Rate:  [82-94] 93 (06/03 0849) Resp:  [16-18] 18 (06/03 0849) BP: (109-142)/(40-67) 113/66 (06/03 0849) SpO2:  [97 %-99 %] 98 % (06/03 0849) Blood pressure 113/66, pulse 93, temperature 97.6 F (36.4 C), temperature source Oral, resp. rate 18, height 5\' 6"  (1.676 m), weight 65 kg, SpO2 98%. Body mass index is 23.13 kg/m.  Physical Exam Vitals reviewed.  Constitutional:  Appearance: Normal appearance.  HENT:     Head: Normocephalic and atraumatic.  Cardiovascular:     Rate and Rhythm: Normal rate.  Pulmonary:     Effort: Pulmonary effort is normal.  Neurological:     General: No focal deficit present.     Mental Status: She is alert.     Mental Status Exam: General Appearance: Fairly Groomed  Orientation:  Full (Time, Place, and Person)  Memory:  Remote;   Fair  Concentration:  Concentration: Fair  Recall:  Fair  Attention  Good  Eye Contact:  Fair  Speech:  Normal Rate  Language:  Fair  Volume:  Decreased  Mood: "ok"  Affect:  Congruent and Restricted  Thought Process:   Coherent and Linear  Thought Content:  Logical  Suicidal Thoughts:  No  Homicidal Thoughts:  No  Judgement:  Fair  Insight:  Shallow  Psychomotor Activity:  Normal  Akathisia:  No  Fund of Knowledge:  Fair      Assets:  Communication Skills Desire for Improvement Resilience  Cognition:  WNL  ADL's:  Intact  AIMS (if indicated):        Other History   These have been pulled in through the EMR, reviewed, and updated if appropriate.  Family History:  The patient's family history is not on file.  Medical History: Past Medical History:  Diagnosis Date   Depression    Diabetes mellitus without complication (HCC)    Hypertension    Nicotine  use 11/01/2023   Suicide attempt (HCC) 11/03/2023    Surgical History: Past Surgical History:  Procedure Laterality Date   CHOLECYSTECTOMY       Medications:   Current Facility-Administered Medications:    acetaminophen  (TYLENOL ) tablet 1,000 mg, 1,000 mg, Oral, Q6H PRN, 1,000 mg at 11/07/23 1136 **OR** acetaminophen  (TYLENOL ) suppository 650 mg, 650 mg, Rectal, Q6H PRN, Spence, Sarah, DO   enoxaparin  (LOVENOX ) injection 40 mg, 40 mg, Subcutaneous, Q24H, Spence, Sarah, DO, 40 mg at 11/06/23 2142   folic acid  (FOLVITE ) tablet 1 mg, 1 mg, Oral, Daily, Spence, Sarah, DO, 1 mg at 11/07/23 4098   glimepiride  (AMARYL ) tablet 1 mg, 1 mg, Oral, BID WC, Spence, Sarah, DO, 1 mg at 11/07/23 1191   insulin  aspart (novoLOG ) injection 0-9 Units, 0-9 Units, Subcutaneous, TID WC, Shitarev, Dimitry, MD, 2 Units at 11/07/23 1152   multivitamin with minerals tablet 1 tablet, 1 tablet, Oral, Daily, Omar Bibber, DO, 1 tablet at 11/07/23 0850   nicotine  (NICODERM CQ  - dosed in mg/24 hours) patch 21 mg, 21 mg, Transdermal, Daily, [name], MD, 21 mg at 11/07/23 4782   Oral care mouth rinse, 15 mL, Mouth Rinse, PRN, McDiarmid, Demetra Filter, MD   risperiDONE  (RISPERDAL ) tablet 0.5 mg, 0.5 mg, Oral, QHS, Hoang, Daniela B, MD, 0.5 mg at 11/06/23 2142    sertraline  (ZOLOFT ) tablet 50 mg, 50 mg, Oral, Daily, Hoang, Daniela B, MD, 50 mg at 11/07/23 0850   thiamine  (VITAMIN B1) tablet 100 mg, 100 mg, Oral, Daily, Edison Gore, MD, 100 mg at 11/07/23 0850  Allergies: Allergies  Allergen Reactions   Insulins     Suicide attempt on insulin  11/01/2023   Aspirin Nausea And Vomiting    Roslynn Coombes, PMHNP

## 2023-11-07 NOTE — Assessment & Plan Note (Addendum)
 Treatment per inpatient psych. UDS positive on admission.

## 2023-11-07 NOTE — Progress Notes (Signed)
     Daily Progress Note Intern Pager: [phone]  Patient name: Robin Montgomery Medical record number: [id number] Date of birth: [birth year] Age: 63 y.o. Gender: female  Primary Care Provider: Nida Barrow, Clinic Consultants: Psychiatry Code Status: Full code  Pt Overview and Major Events to Date:  5/27: Admitted  Assessment and Plan: [name] is a 63 year old female with PMH of depression, T2DM, HTN, and polysubstance abuse who presents with AMS and hypoglycemia secondary to insulin  overdose for attempted suicide.  Patient is medically stable and awaiting inpatient psychiatry bed. Assessment & Plan Wernicke encephalopathy Completed IV thiamine  and transitioned to PO. Intentional overdose  MDD (major depressive disorder), recurrent severe, without psychosis (HCC) Stable, pending transfer to be [facility].  IVC'd. - Transfer to inpatient psychiatry pending - Continue 1:1 telemonitoring - Continue Sertraline  50 mg once daily for MDD - Continue Risperdal  0.5 mg once at night for auditory hallucinations Diabetes mellitus type 2, insulin  dependent (HCC) Stable.  Discontinued home Levemir  due to suicide attempt with insulin . - Continue CBGs to with meals and at bedtime, sensitive SSI - Continue Glimepiride  1 mg  - Consider pioglitazone - Diabetes coordinator will continue to follow  Alcohol use disorder 1 pint of vodka daily, recently in rehab, will monitor for alcohol withdrawal. Ethanol of 0 on admission and no alcohol withdrawal symptoms for 48 hours during admission, so CIWAs discontinued - Folic acid  1 mg daily  - Multivitamin daily  Stimulant use disorder (cocaine) Treatment per inpatient psych. UDS positive on admission.  Chronic health problem HTN: normotensive during admission  FEN/GI: Carb modified PPx: Lovenox  Dispo: Transfer to [facility] when bed available  Subjective:  Patient reports that she is doing well.  No SI or HI.  Would like to go home to her cat since no one is able  to take care of him after today.  Objective: Temp:  [97.1 F (36.2 C)-98.1 F (36.7 C)] 97.6 F (36.4 C) (06/03 0849) Pulse Rate:  [82-94] 93 (06/03 0849) Resp:  [16-18] 18 (06/03 0849) BP: (109-142)/(40-67) 113/66 (06/03 0849) SpO2:  [97 %-99 %] 98 % (06/03 0849) Physical Exam: General: Awake and Alert in NAD HEENT: NCAT. Sclera anicteric. No rhinorrhea. Cardiovascular: RRR. No M/R/G Respiratory: CTAB, normal WOB on RA. No wheezing, crackles, rhonchi, or diminished breath sounds. Abdomen: Soft, non-tender, non-distended. Bowel sounds normoactive Extremities: Able to move all extremities. No BLE edema, no deformities or significant joint findings. Skin: Warm and dry. No abrasions or rashes noted. Neuro: No focal neurological deficits.  Laboratory: Most recent CBC Lab Results  Component Value Date   WBC 10.0 11/01/2023   HGB 15.4 (H) 11/01/2023   HCT 45.4 11/01/2023   MCV 93.8 11/01/2023   PLT 344 11/01/2023   Most recent BMP    Latest Ref Rng & Units 11/07/2023    3:30 AM  BMP  Creatinine 0.44 - 1.00 mg/dL 1.30    Imaging/Diagnostic Tests: No new imaging.  Clyda Dark, DO 11/07/2023, 1:55 PM  PGY-1, [facility] FPTS Intern pager: [phone], text pages welcome Secure chat group [facility] Teaching Service

## 2023-11-07 NOTE — Assessment & Plan Note (Signed)
 Stable, pending transfer to be [facility].  IVC'd. - Transfer to inpatient psychiatry pending - Continue 1:1 telemonitoring - Continue Sertraline  50 mg once daily for MDD - Continue Risperdal  0.5 mg once at night for auditory hallucinations

## 2023-11-07 NOTE — Assessment & Plan Note (Addendum)
 Completed IV thiamine  and transitioned to PO.

## 2023-11-07 NOTE — Progress Notes (Signed)
 Order received to discharge patient.  PIV access x1 removed.  Report called to receiving RN at [facility].  [facility] called to set up patient's transport.
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
## 2023-11-07 NOTE — Group Note (Signed)
 Date:  11/07/2023 Time:  11:44 PM  Group Topic/Focus:  Wrap-Up Group:   The focus of this group is to help patients review their daily goal of treatment and discuss progress on daily workbooks.    Participation Level:  Did Not Attend  Participation Quality:     Affect:     Cognitive:     Insight: None  Engagement in Group:  None  Modes of Intervention:     Additional Comments:    Rolland Cline 11/07/2023, 11:44 PM

## 2023-11-07 NOTE — Assessment & Plan Note (Signed)
 Stable.  Discontinued home Levemir  due to suicide attempt with insulin . - Continue CBGs to with meals and at bedtime, sensitive SSI - Continue Glimepiride  1 mg  - Consider pioglitazone - Diabetes coordinator will continue to follow

## 2023-11-07 NOTE — Discharge Summary (Signed)
 Family Medicine Teaching [facility] Discharge Summary  Patient name: Robin Montgomery Medical record number: [id number] Date of birth: [birth year] Age: 63 y.o. Gender: female Date of Admission: 10/31/2023  Date of Discharge: 11/07/23   Admitting Physician: Omar Bibber, DO  Primary Care Provider: Nida Barrow, Clinic Consultants: Psychiatry  Indication for Hospitalization: Intentional OD w/ insulin   Discharge Diagnoses/Problem List:  Principal Problem for Admission:  Other Problems addressed during stay:  Active Problems:   Intentional overdose  MDD (major depressive disorder), recurrent severe, without psychosis (HCC)   Alcohol use disorder   Stimulant use disorder (cocaine)   Substance induced mood disorder (HCC)   Wernicke encephalopathy   Involuntary commitment   Diabetes mellitus type 2, insulin  dependent (HCC)   Chronic health problem   Severe episode of recurrent major depressive disorder, without psychotic features (HCC)   Encounter for person awaiting admission to psychiatric care setting   Insulin  overdose  Brief Hospital Course:  Robin Montgomery is a 63 y.o.female with a history of insulin -dependent T2DM, polysubstance use, MDD, HTN who was admitted to the [facility] Medicine Teaching Service at [facility] for hypoglycemia.   Her hospital course is detailed below:  Intentional overdose on insulin  Patient reported intentionally overdosing her Levemir  in an attempt to commit suicide on the day prior to her admission to the hospital.  She has had 1 prior suicide attempt approximately 10 years ago.  She reports that she has struggled with her mental health since her drug and alcohol classes ended approximately 2 weeks ago.  Psychiatry was consulted and recommended inpatient psychiatry, starting sertraline  50 daily for MDD, and risperdal  0.5 mg night for auditory hallucinations.  On 5/31 patient changed her mind about voluntary admission to [facility] and required IVC. Then later, patient  signed an voluntary consult form to be admitted to [facility].   Hypoglycemia Presented to ED via EMS with blood glucose to 33 which recovered with D50. Pt did become altered again after presenting to ED and was found again to be severely hypoglycemic. Other labs showed CMP grossly unremarkable, CBC with mild leukocytosis, UA with glucosuria and bacteria.  CBGs were collected every 1 hour following her admission to the hospital with steady improvement.  We encouraged PO intake and her blood sugar responded appropriately.  T2DM Stopped long acting insulin  due to intentional overdose x2 and plan to switch to oral agent w/o OD potential.  Reports that she does not want metformin  and has had very bad diarrhea in past in this medication. A1c 9.5. For cost adherence and reducing risk of overdose, patient was started on glimepiride  1 mg. Would also recommend titrating dose as tolerated and adding on pioglitazone as needed. Glimepiride  has lowest risk of fatal overdose for sulfonylurea in [place]  and pioglitazone has no OD potential.   AMS Pt altered/somnolent on arrival, thought most likely due to hypoglycemia, however other workup completed as indicated. TSH WNL. CXR showed no acute cardiopulmonary disease. Because she may have fallen and complained of frontal HA, CT Head ordered and showed no acute intracranial process. UDS positive for cocaine and THC. Ethanol level <15. CK WNL, fortunately no concern for rhabdomyolysis. Improved to baseline within 24 hours supporting hypoglycemia induced.   Alcohol use disorder  Stimulant use disorder (cocaine)  Nicotine  use Per patient was drinking 1 pint of vodka daily, recently in rehab, also endorsed cocaine use and was positive on UDS for cocaine and THC.  CIWAs were minimal during hospitalization.  Patient endorsed soaking cigarettes and asked  for nicotine  patch highest strength and was given 21 mg once daily. Did not discuss with patient rehab given going to  inpatient psychiatry.   PCP Follow-up Recommendations: Defer to PCP for T2DM medications including restarting insulin  if indicated. Otherwise can titrate glimepiride  and add pioglitazone.  Ensure follow up with psychiatry / substance use resources.    Disposition: [facility]  Discharge Condition: Stable  Discharge Exam:  Vitals:   11/07/23 0849 11/07/23 1800  BP: 113/66 129/78  Pulse: 93   Resp: 18 18  Temp: 97.6 F (36.4 C)   SpO2: 98% 97%   General: Awake and Alert in NAD HEENT: NCAT. Sclera anicteric. No rhinorrhea. Cardiovascular: RRR. No M/R/G Respiratory: CTAB, normal WOB on RA. No wheezing, crackles, rhonchi, or diminished breath sounds. Abdomen: Soft, non-tender, non-distended. Bowel sounds normoactive Extremities: Able to move all extremities. No BLE edema, no deformities or significant joint findings. Skin: Warm and dry. No abrasions or rashes noted. Neuro: No focal neurological deficits.  Significant Procedures: none  Significant Labs and Imaging:  No results for input(s): "WBC", "HGB", "HCT", "PLT" in the last 48 hours. Recent Labs  Lab 11/07/23 0330  CREATININE 0.54    CXR: No acute cardiopulmonary disease.  CT Head w/o contrast: No acute intracranial abnormality.  Results/Tests Pending at Time of Discharge: none  Discharge Medications:  Allergies as of 11/07/2023       Reactions   Insulins    Suicide attempt on insulin  11/01/2023   Aspirin Nausea And Vomiting        Medication List     STOP taking these medications    Austedo  12 MG Tabs Generic drug: Deutetrabenazine    insulin  lispro 100 UNIT/ML KwikPen Commonly known as: HUMALOG   Levemir  FlexTouch 100 UNIT/ML FlexTouch Pen Generic drug: insulin  detemir   lisinopril  40 MG tablet Commonly known as: ZESTRIL    losartan  25 MG tablet Commonly known as: COZAAR    metFORMIN  500 MG 24 hr tablet Commonly known as: GLUCOPHAGE -XR   omeprazole  40 MG capsule Commonly known as: PRILOSEC    traZODone  50 MG tablet Commonly known as: DESYREL        TAKE these medications    albuterol  108 (90 Base) MCG/ACT inhaler Commonly known as: Ventolin  HFA Inhale 1-2 puffs into the lungs every 6 (six) hours as needed for wheezing or shortness of breath.   glimepiride  1 MG tablet Commonly known as: AMARYL  Take 1 tablet (1 mg total) by mouth 2 (two) times daily with a meal.   multivitamin with minerals Tabs tablet Take 1 tablet by mouth daily.   risperiDONE  0.5 MG tablet Commonly known as: RISPERDAL  Take 1 tablet (0.5 mg total) by mouth at bedtime.   sertraline  50 MG tablet Commonly known as: ZOLOFT  Take 1 tablet (50 mg total) by mouth daily.   thiamine  100 MG tablet Commonly known as: Vitamin B-1 Take 1 tablet (100 mg total) by mouth daily. Start taking on: November 08, 2023       Discharge Instructions: Please refer to Patient Instructions section of EMR for full details.  Patient was counseled important signs and symptoms that should prompt return to medical care, changes in medications, dietary instructions, activity restrictions, and follow up appointments.   Follow-Up Appointments:  Follow-up Information     Nida Barrow, Clinic Follow up on 11/15/2023.   Why: 930 - ARRIVE BY 915 :-)                Clyda Dark, DO 11/07/2023, 6:10 PM PGY-1, [facility]

## 2023-11-07 NOTE — Assessment & Plan Note (Addendum)
 HTN: normotensive during admission

## 2023-11-07 NOTE — Progress Notes (Signed)
 Occupational Therapy Treatment and Discharge  Patient Details Name: Robin Montgomery MRN: [id number] DOB: 06-07-1960 Today's Date: 11/07/2023   History of present illness Pt is a 63 yr old female presented 10/31/23 due to AMS and hypoglycemia. Pt reports intentionally overdosing on her insulin  5/27 in an attempt to end her life. She also reports being clean/sober for 2 years however she had alcohol and did crack/cocaine yesterday as well. Reports not knowing her trigger. PMH: T2DM, polysubstance abuse, major depressive disorder, HTN   OT comments  Pt at this time presented in bed and reported they did ADLs already this am. At this time ambulated the halls twice in session with Independently. She was able able to retrieve items and gather items off the floor in session. Pt reported no pain, dizziness or SOB in session. At this time Acute Occupational Therapy signing off.        If plan is discharge home, recommend the following:  Assist for transportation   Equipment Recommendations  None recommended by OT    Recommendations for Other Services      Precautions / Restrictions Precautions Precautions: Fall Restrictions Weight Bearing Restrictions Per Provider Order: No       Mobility Bed Mobility Overal bed mobility: Independent                  Transfers Overall transfer level: Independent Equipment used: None Transfers: Sit to/from Stand                   Balance Overall balance assessment: Modified Independent (just pacing self)                                         ADL either performed or assessed with clinical judgement   ADL Overall ADL's : Modified independent (to supervision)                                            Extremity/Trunk Assessment Upper Extremity Assessment Upper Extremity Assessment: Overall WFL for tasks assessed   Lower Extremity Assessment Lower Extremity Assessment: Generalized weakness         Vision   Vision Assessment?: No apparent visual deficits   Perception     Praxis     Communication Communication Communication: No apparent difficulties   Cognition Arousal: Alert                                   Following commands: Intact        Cueing   Cueing Techniques: Verbal cues  Exercises      Shoulder Instructions       General Comments      Pertinent Vitals/ Pain       Pain Assessment Pain Assessment: No/denies pain  Home Living                                          Prior Functioning/Environment              Frequency  Min 2X/week        Progress Toward Goals  OT Goals(current goals  can now be found in the care plan section)  Progress towards OT goals: Goals met/education completed, patient discharged from OT  Acute Rehab OT Goals Patient Stated Goal: to go OT Goal Formulation: With patient Time For Goal Achievement: 11/15/23 Potential to Achieve Goals: Good ADL Goals Pt Will Perform Lower Body Bathing: with modified independence;sit to/from stand Pt Will Perform Lower Body Dressing: with modified independence;sitting/lateral leans;sit to/from stand Pt Will Perform Tub/Shower Transfer: with modified independence;Tub transfer;Shower transfer;ambulating Additional ADL Goal #1: Pt will gather ADL supplies without LOB  Plan      Co-evaluation                 AM-PAC OT "6 Clicks" Daily Activity     Outcome Measure   Help from another person eating meals?: None Help from another person taking care of personal grooming?: None Help from another person toileting, which includes using toliet, bedpan, or urinal?: None Help from another person bathing (including washing, rinsing, drying)?: None Help from another person to put on and taking off regular upper body clothing?: None Help from another person to put on and taking off regular lower body clothing?: None 6 Click Score: 24    End of  Session Equipment Utilized During Treatment: Gait belt  OT Visit Diagnosis: Unsteadiness on feet (R26.81);Other abnormalities of gait and mobility (R26.89);Muscle weakness (generalized) (M62.81)   Activity Tolerance Patient tolerated treatment well   Patient Left in bed;with call bell/phone within reach Psychiatrist)   Nurse Communication Mobility status        Time: 2025-4270 OT Time Calculation (min): 14 min  Charges: OT General Charges $OT Visit: 1 Visit OT Treatments $Self Care/Home Management : 8-22 mins  Erving Heather OTR/L  Acute Rehab Services  [phone] office number   Stevphen Elders 11/07/2023, 10:23 AM

## 2023-11-07 NOTE — Progress Notes (Signed)
 Mobility Specialist Progress Note:    11/07/23 1059  Mobility  Activity Ambulated independently in hallway;Ambulated independently in room  Level of Assistance Independent  Assistive Device None  Distance Ambulated (ft) 470 ft  Activity Response Tolerated well  Mobility Referral Yes  Mobility visit 1 Mobility  Mobility Specialist Start Time (ACUTE ONLY) 1053  Mobility Specialist Stop Time (ACUTE ONLY) 1059  Mobility Specialist Time Calculation (min) (ACUTE ONLY) 6 min   Pt received ambulating outside of room, agreeable to mobility session. Tolerated well, asx throughout. Returned pt to room, eager for d/c.   [name] Mobility Specialist Please contact via SecureChat or  Rehab office at [phone]

## 2023-11-07 NOTE — Assessment & Plan Note (Addendum)
 1 pint of vodka daily, recently in rehab, will monitor for alcohol withdrawal. Ethanol of 0 on admission and no alcohol withdrawal symptoms for 48 hours during admission, so CIWAs discontinued - Folic acid  1 mg daily  - Multivitamin daily

## 2023-11-08 ENCOUNTER — Other Ambulatory Visit (HOSPITAL_COMMUNITY): Payer: Self-pay

## 2023-11-08 ENCOUNTER — Encounter: Payer: Self-pay | Admitting: Behavioral Health

## 2023-11-08 DIAGNOSIS — F332 Major depressive disorder, recurrent severe without psychotic features: Secondary | ICD-10-CM | POA: Diagnosis not present

## 2023-11-08 LAB — GLUCOSE, CAPILLARY
Glucose-Capillary: 112 mg/dL — ABNORMAL HIGH (ref 70–99)
Glucose-Capillary: 87 mg/dL (ref 70–99)
Glucose-Capillary: 141 mg/dL — ABNORMAL HIGH (ref 70–99)

## 2023-11-08 MED ORDER — METFORMIN HCL ER 500 MG PO TB24
500.0000 mg | ORAL_TABLET | Freq: Two times a day (BID) | ORAL | Status: DC
  Administered 2023-11-08: 500 mg via ORAL
  Filled 2023-11-08 (×4): qty 1

## 2023-11-08 MED ORDER — RISPERIDONE 1 MG PO TABS
1.0000 mg | ORAL_TABLET | Freq: Every day | ORAL | Status: DC
  Administered 2023-11-08 – 2023-11-11 (×4): 1 mg via ORAL
  Filled 2023-11-08 (×4): qty 1
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
## 2023-11-08 NOTE — Plan of Care (Signed)
  Problem: Education: Goal: Knowledge of General Education information will improve Description: Including pain rating scale, medication(s)/side effects and non-pharmacologic comfort measures Outcome: Progressing   Problem: Health Behavior/Discharge Planning: Goal: Ability to manage health-related needs will improve Outcome: Progressing   Problem: Activity: Goal: Risk for activity intolerance will decrease Outcome: Progressing   Problem: Nutrition: Goal: Adequate nutrition will be maintained Outcome: Progressing   Problem: Coping: Goal: Level of anxiety will decrease Outcome: Progressing   Problem: Safety: Goal: Ability to remain free from injury will improve Outcome: Progressing   Problem: Coping: Goal: Ability to identify and develop effective coping behavior will improve Outcome: Progressing

## 2023-11-08 NOTE — Progress Notes (Signed)
   11/08/23 1600  Psych Admission Type (Psych Patients Only)  Admission Status Voluntary  Psychosocial Assessment  Patient Complaints None  Eye Contact Brief  Facial Expression Animated  Affect Appropriate to circumstance  Speech Logical/coherent  Interaction Assertive  Motor Activity Slow  Appearance/Hygiene Unremarkable  Behavior Characteristics Appropriate to situation  Mood Pleasant  Thought Process  Coherency WDL  Content WDL  Delusions None reported or observed  Perception Hallucinations  Hallucination Auditory  Judgment Impaired  Confusion None  Danger to Self  Current suicidal ideation? Denies  Agreement Not to Harm Self No  Description of Agreement verbal  Danger to Others  Danger to Others None reported or observed

## 2023-11-08 NOTE — Group Note (Signed)
 Physical/Occupational Therapy Group Note  Group Topic: Transfer Training   Group Date: 11/08/2023 Start Time: 1300 End Time: 1334 Facilitators: Janean Eischen, Otelia Blew, PT   Group Description: Group educated on sequence and techniques to maximize safety with functional transfers.  Additionally, integrated education on impact of seating surfaces, use of assistive device and management of orthostasis with movement transitions.  Patients actively engaged with functional transfers (sit/stand) from various seating surfaces, with and without assist devices, working to integrate and retain education provided during session.  Allowed time for questions and further discussion on mobility concerns/needs.   Therapeutic Goal(s):  Identify and demonstrate safe technique for sit/stand transfers from various seating surfaces. Identify and demonstrate safe use of assistive devices with basic transfers and simple mobility. Identify and demonstrate ability to recognize signs/symptoms of orthostasis and appropriate compensatory/safety techniques.  Individual Participation: Pt participated actively and appropriately both during the discussion and activity portions of the session.  Pt was quick to participate in all activities and volunteered to participate in a demonstration in front of the group.   Participation Level: Active and Engaged   Participation Quality: Minimal Cues   Behavior: Alert, Appropriate, Attentive , and Cooperative   Speech/Thought Process: Focused, Organized, and Relevant   Affect/Mood: Appropriate   Insight: Good   Judgement: Good   Individualization: Per above   Modes of Intervention: Activity, Discussion, and Education  Patient Response to Interventions:  Attentive, Engaged, Interested , and Receptive   Plan: Continue to engage patient in PT/OT groups 1 - 2x/week.  Lavenia Post PT, DPT 11/08/23, 3:01 PM

## 2023-11-08 NOTE — Inpatient Diabetes Management (Signed)
 Inpatient Diabetes Program Recommendations  AACE/ADA: New Consensus Statement on Inpatient Glycemic Control   Target Ranges:  Prepandial:   less than 140 mg/dL      Peak postprandial:   less than 180 mg/dL (1-2 hours)      Critically ill patients:  140 - 180 mg/dL    Latest Reference Range & Units 11/07/23 06:40 11/07/23 11:25 11/07/23 16:55 11/07/23 21:42  Glucose-Capillary 70 - 99 mg/dL 629 (H) 528 (H) 413 (H) 169 (H)    Latest Reference Range & Units 11/06/23 06:06 11/06/23 11:32 11/06/23 16:38 11/06/23 20:51  Glucose-Capillary 70 - 99 mg/dL 244 (H) 010 (H) 272 (H) 138 (H)   Review of Glycemic Control  Diabetes history: DM2 Outpatient Diabetes medications: Levemir  30 units BID, Humalog 10 units TID, Metformin  500 mg daily, Jardiance  10 mg daily Current orders for Inpatient glycemic control: Amaryl  1 mg BID, Novolog  0-9 units TID with meals  Inpatient Diabetes Program Recommendations:    Oral DM: CBGs ranged from 126-248 mg/dl on 6/3 with Amaryl  1 mg BID and Novolog  correction.   Please consider adding Metformin  XR 500 mg BID.  Outpatient DM: Due to insulin  overdose, may want to discharge patient on oral DM medications if possible.  NOTE: Patient admitted to [facility] with suicide attempt via insulin  overdose. Inpatient diabetes coordinator spoke with patient on 11/01/23.   Thanks, Beacher Limerick, RN, MSN, CDCES Diabetes Coordinator Inpatient Diabetes Program [phone] (Team Pager from 8am to 5pm)

## 2023-11-08 NOTE — Group Note (Signed)
 Date:  11/08/2023 Time:  3:27 PM  Group Topic/Focus:  Wellness Toolbox:   The focus of this group is to discuss various aspects of wellness, balancing those aspects and exploring ways to increase the ability to experience wellness.  Patients will create a wellness toolbox for use upon discharge.    Participation Level:  Active  Participation Quality:  Appropriate  Affect:  Appropriate  Cognitive:  Appropriate  Insight: Appropriate  Engagement in Group:  Engaged  Modes of Intervention:  Discussion  Additional Comments:  N/A  Lyndol Santee 11/08/2023, 3:27 PM

## 2023-11-08 NOTE — [facility] Suicide Risk Assessment (Signed)
 [facility] Admission Suicide Risk Assessment   Nursing information obtained from:  Patient Demographic factors:  Living alone Current Mental Status:  NA Loss Factors:  NA Historical Factors:  Prior suicide attempts Risk Reduction Factors:  Sense of responsibility to family  Total Time spent with patient: 30 minutes Principal Problem: Major depressive disorder, recurrent severe without psychotic features (HCC) Diagnosis:  Principal Problem:   Major depressive disorder, recurrent severe without psychotic features (HCC)  Subjective Data: Robin Montgomery is a 63 y.o. female admitted: Medicallyfor 10/31/2023 11:32 AM for AMS and noted to have low CBG with EMS to 31. She carries the psychiatric diagnoses of MDD, PTSD, AUD, and stimulant use d/o and has a past medical history of  HTN, IDT2DM, HLD. Patient admitted for overdose on insulin  and became hypoglycemic. Patient was seen by psychiatry and admitted patient to inpatient gero psych unit.  Continued Clinical Symptoms:  Alcohol Use Disorder Identification Test Final Score (AUDIT): 39 The "Alcohol Use Disorders Identification Test", Guidelines for Use in Primary Care, Second Edition.  World Science writer [facility]). Score between 0-7:  no or low risk or alcohol related problems. Score between 8-15:  moderate risk of alcohol related problems. Score between 16-19:  high risk of alcohol related problems. Score 20 or above:  warrants further diagnostic evaluation for alcohol dependence and treatment.   CLINICAL FACTORS:   Depression:   Comorbid alcohol abuse/dependence   Musculoskeletal: Strength & Muscle Tone: within normal limits Gait & Station: normal Patient leans: N/A  Psychiatric Specialty Exam:  Presentation  General Appearance:  Casual  Eye Contact: Fair  Speech: Pressured  Speech Volume: Increased  Handedness: Right   Mood and Affect  Mood: Euphoric  Affect: Appropriate   Thought Process  Thought  Processes: Irrevelant  Descriptions of Associations:Intact  Orientation:Full (Time, Place and Person)  Thought Content:Illogical  History of Schizophrenia/Schizoaffective disorder:No data recorded Duration of Psychotic Symptoms:No data recorded Hallucinations:Hallucinations: Auditory Description of Auditory Hallucinations: telling her to kill self  Ideas of Reference:None  Suicidal Thoughts:Suicidal Thoughts: No  Homicidal Thoughts:Homicidal Thoughts: No   Sensorium  Memory: Immediate Fair; Recent Fair; Remote Fair  Judgment: Impaired  Insight: Lacking   Executive Functions  Concentration: Fair  Attention Span: Fair  Recall: [employer] of Knowledge: Fair  Language: Fair   Psychomotor Activity  Psychomotor Activity: Psychomotor Activity: Normal   Assets  Assets: Communication Skills; Desire for Improvement; Housing; Resilience   Sleep  Sleep: Sleep: Fair    Physical Exam: Physical Exam Vitals and nursing note reviewed.  HENT:     Head: Normocephalic.  Cardiovascular:     Rate and Rhythm: Normal rate.  Neurological:     Mental Status: She is alert.    ROS Blood pressure 135/67, pulse 86, temperature 97.7 F (36.5 C), resp. rate 17, height 5\' 1"  (1.549 m), weight 56 kg, SpO2 100%. Body mass index is 23.34 kg/m.   COGNITIVE FEATURES THAT CONTRIBUTE TO RISK:  None    SUICIDE RISK:   Mild:  Suicidal ideation of limited frequency, intensity, duration, and specificity.  There are no identifiable plans, no associated intent, mild dysphoria and related symptoms, good self-control (both objective and subjective assessment), few other risk factors, and identifiable protective factors, including available and accessible social support.  PLAN OF CARE: Patient is admitted to [facility] psych unit with Q15 min safety monitoring. Multidisciplinary team approach is offered. Medication management; group/milieu therapy is offered.   I certify that  inpatient services furnished can reasonably be expected  to improve the patient's condition.   Aurelia Blotter, MD 11/08/2023, 8:33 PM

## 2023-11-08 NOTE — BH IP Treatment Plan (Signed)
 Interdisciplinary Treatment and Diagnostic Plan Update  11/08/2023 Time of Session: 11:08 AM  Robin Montgomery MRN: [id number]  Principal Diagnosis: Major depressive disorder, recurrent severe without psychotic features (HCC)  Secondary Diagnoses: Principal Problem:   Major depressive disorder, recurrent severe without psychotic features (HCC)   Current Medications:  Current Facility-Administered Medications  Medication Dose Route Frequency Provider Last Rate Last Admin   acetaminophen  (TYLENOL ) tablet 650 mg  650 mg Oral Q6H PRN Lissa Riding, NP   650 mg at 11/08/23 0522   alum & mag hydroxide-simeth (MAALOX/MYLANTA) 200-200-20 MG/5ML suspension 30 mL  30 mL Oral Q4H PRN Lord, Jamison Y, NP       folic acid  (FOLVITE ) tablet 1 mg  1 mg Oral Daily Lissa Riding, NP   1 mg at 11/08/23 1035   glimepiride  (AMARYL ) tablet 1 mg  1 mg Oral BID WC Lissa Riding, NP   1 mg at 11/08/23 1036   insulin  aspart (novoLOG ) injection 0-9 Units  0-9 Units Subcutaneous TID WC Lissa Riding, NP   2 Units at 11/08/23 0800   magnesium  hydroxide (MILK OF MAGNESIA) suspension 30 mL  30 mL Oral Daily PRN Lissa Riding, NP       metFORMIN  (GLUCOPHAGE -XR) 24 hr tablet 500 mg  500 mg Oral BID WC Jadapalle, Sree, MD       multivitamin with minerals tablet 1 tablet  1 tablet Oral Daily Lissa Riding, NP   1 tablet at 11/08/23 1035   nicotine  (NICODERM CQ  - dosed in mg/24 hours) patch 21 mg  21 mg Transdermal Daily Lissa Riding, NP   21 mg at 11/08/23 1034   OLANZapine (ZYPREXA) tablet 10 mg  10 mg Oral BID PRN Lord, Jamison Y, NP       Or   OLANZapine (ZYPREXA) injection 10 mg  10 mg Intramuscular BID PRN Lord, Jamison Y, NP       risperiDONE  (RISPERDAL ) tablet 0.5 mg  0.5 mg Oral QHS Lissa Riding, NP   0.5 mg at 11/07/23 2206   sertraline  (ZOLOFT ) tablet 50 mg  50 mg Oral Daily Lord, Jamison Y, NP   50 mg at 11/08/23 1034   PTA Medications: Medications Prior to Admission  Medication Sig Dispense  Refill Last Dose/Taking   albuterol  (VENTOLIN  HFA) 108 (90 Base) MCG/ACT inhaler Inhale 1-2 puffs into the lungs every 6 (six) hours as needed for wheezing or shortness of breath. 18 g 0    glimepiride  (AMARYL ) 1 MG tablet Take 1 tablet (1 mg total) by mouth 2 (two) times daily with a meal.      Multiple Vitamin (MULTIVITAMIN WITH MINERALS) TABS tablet Take 1 tablet by mouth daily.      risperiDONE  (RISPERDAL ) 0.5 MG tablet Take 1 tablet (0.5 mg total) by mouth at bedtime.      sertraline  (ZOLOFT ) 50 MG tablet Take 1 tablet (50 mg total) by mouth daily.      thiamine  (VITAMIN B-1) 100 MG tablet Take 1 tablet (100 mg total) by mouth daily. 30 tablet 0     Patient Stressors:    Patient Strengths:    Treatment Modalities: Medication Management, Group therapy, Case management,  1 to 1 session with clinician, Psychoeducation, Recreational therapy.   Physician Treatment Plan for Primary Diagnosis: Major depressive disorder, recurrent severe without psychotic features (HCC) Long Term Goal(s):     Short Term Goals:    Medication Management: Evaluate patient's response, side effects, and tolerance of  medication regimen.  Therapeutic Interventions: 1 to 1 sessions, Unit Group sessions and Medication administration.  Evaluation of Outcomes: Not Progressing  Physician Treatment Plan for Secondary Diagnosis: Principal Problem:   Major depressive disorder, recurrent severe without psychotic features (HCC)  Long Term Goal(s):     Short Term Goals:       Medication Management: Evaluate patient's response, side effects, and tolerance of medication regimen.  Therapeutic Interventions: 1 to 1 sessions, Unit Group sessions and Medication administration.  Evaluation of Outcomes: Not Progressing   RN Treatment Plan for Primary Diagnosis: Major depressive disorder, recurrent severe without psychotic features (HCC) Long Term Goal(s): Knowledge of disease and therapeutic regimen to maintain health  will improve  Short Term Goals: Ability to remain free from injury will improve, Ability to verbalize frustration and anger appropriately will improve, Ability to demonstrate self-control, Ability to participate in decision making will improve, Ability to verbalize feelings will improve, Ability to disclose and discuss suicidal ideas, Ability to identify and develop effective coping behaviors will improve, and Compliance with prescribed medications will improve  Medication Management: RN will administer medications as ordered by provider, will assess and evaluate patient's response and provide education to patient for prescribed medication. RN will report any adverse and/or side effects to prescribing provider.  Therapeutic Interventions: 1 on 1 counseling sessions, Psychoeducation, Medication administration, Evaluate responses to treatment, Monitor vital signs and CBGs as ordered, Perform/monitor CIWA, COWS, AIMS and Fall Risk screenings as ordered, Perform wound care treatments as ordered.  Evaluation of Outcomes: Not Progressing   LCSW Treatment Plan for Primary Diagnosis: Major depressive disorder, recurrent severe without psychotic features (HCC) Long Term Goal(s): Safe transition to appropriate next level of care at discharge, Engage patient in therapeutic group addressing interpersonal concerns.  Short Term Goals: Engage patient in aftercare planning with referrals and resources, Increase social support, Increase ability to appropriately verbalize feelings, Increase emotional regulation, Facilitate acceptance of mental health diagnosis and concerns, Facilitate patient progression through stages of change regarding substance use diagnoses and concerns, Identify triggers associated with mental health/substance abuse issues, and Increase skills for wellness and recovery  Therapeutic Interventions: Assess for all discharge needs, 1 to 1 time with Social worker, Explore available resources and  support systems, Assess for adequacy in community support network, Educate family and significant other(s) on suicide prevention, Complete Psychosocial Assessment, Interpersonal group therapy.  Evaluation of Outcomes: Not Progressing   Progress in Treatment: Attending groups: No. Participating in groups: No. Taking medication as prescribed: Yes. Toleration medication: Yes. Family/Significant other contact made: No, will contact:  CSW will contact if given permission  Patient understands diagnosis: Yes. Discussing patient identified problems/goals with staff: Yes. Medical problems stabilized or resolved: Yes. Denies suicidal/homicidal ideation: Yes. Issues/concerns per patient self-inventory: No. Other: None   New problem(s) identified: No, Describe:  None identified   New Short Term/Long Term Goal(s):  elimination of symptoms of psychosis, medication management for mood stabilization; elimination of SI thoughts; development of comprehensive mental wellness/sobriety plan.   Patient Goals:  "Just to get more exercise in and get off the drugs, getting my head back together, get on my medicine"  Discharge Plan or Barriers: CSW will assist with appropriate discharge planning   Reason for Continuation of Hospitalization: Depression Medication stabilization  Estimated Length of Stay: 1 to 7 days   Last 3 [place] Suicide Severity Risk Score: Flowsheet Row Admission (Current) from 11/07/2023 in [facility] ED to Hosp-Admission (Discharged) from 10/31/2023 in [facility] 4E CV SURGICAL  PROGRESSIVE CARE ED to Hosp-Admission (Discharged) from 11/15/2022 in [facility] 5 EAST MEDICAL UNIT  C-SSRS RISK CATEGORY No Risk High Risk No Risk       Last PHQ 2/9 Scores:    02/28/2022    2:22 PM 01/19/2021    3:44 PM 01/08/2020    3:08 PM  Depression screen PHQ 2/9  Decreased Interest 1  1  Down, Depressed, Hopeless 0 1 0  PHQ - 2 Score 1 1 1   Altered sleeping 1   0  Tired, decreased energy 1  1  Change in appetite 0  2  Feeling bad or failure about yourself  1  0  Trouble concentrating 1  1  Moving slowly or fidgety/restless 1  0  Suicidal thoughts 0  0  PHQ-9 Score 6  5    Scribe for Treatment Team: Claudio Culver, [place] 11/08/2023 11:34 AM

## 2023-11-08 NOTE — Group Note (Signed)
 Date:  11/08/2023 Time:  9:57 PM  Group Topic/Focus:  Goals Group:   The focus of this group is to help patients establish daily goals to achieve during treatment and discuss how the patient can incorporate goal setting into their daily lives to aide in recovery. Wrap-Up Group:   The focus of this group is to help patients review their daily goal of treatment and discuss progress on daily workbooks.    Participation Level:  Active  Participation Quality:  Sharing  Affect:  Appropriate  Cognitive:  Appropriate  Insight: Appropriate  Engagement in Group:  Engaged  Modes of Intervention:  Discussion  Additional Comments:    Joann Mu 11/08/2023, 9:57 PM

## 2023-11-08 NOTE — Plan of Care (Signed)

## 2023-11-08 NOTE — Plan of Care (Signed)

## 2023-11-09 ENCOUNTER — Encounter: Payer: Self-pay | Admitting: Behavioral Health

## 2023-11-09 DIAGNOSIS — F332 Major depressive disorder, recurrent severe without psychotic features: Secondary | ICD-10-CM | POA: Diagnosis not present

## 2023-11-09 LAB — GLUCOSE, CAPILLARY
Glucose-Capillary: 217 mg/dL — ABNORMAL HIGH (ref 70–99)
Glucose-Capillary: 66 mg/dL — ABNORMAL LOW (ref 70–99)
Glucose-Capillary: 89 mg/dL (ref 70–99)
Glucose-Capillary: 178 mg/dL — ABNORMAL HIGH (ref 70–99)

## 2023-11-09 NOTE — BHH Counselor (Signed)
 CSW contacted Ms. Racheal Buddle [phone]) at her community support team per pt's request.   [name] reports that she is apart of the [facility] and that pt's services will begin the day she is discharger from the hospital  Derrill Flirt, MSW, [facility] 11/09/2023 11:27 AM

## 2023-11-09 NOTE — Progress Notes (Signed)
   11/09/23 2100  Psych Admission Type (Psych Patients Only)  Admission Status Voluntary  Psychosocial Assessment  Patient Complaints None  Eye Contact Fair  Facial Expression Animated  Affect Appropriate to circumstance  Speech Logical/coherent  Interaction Assertive  Motor Activity Restless  Appearance/Hygiene Unremarkable  Behavior Characteristics Cooperative;Appropriate to situation  Mood Pleasant  Thought Process  Coherency WDL  Content WDL  Delusions None reported or observed  Perception WDL  Hallucination None reported or observed  Judgment WDL  Confusion None  Danger to Self  Current suicidal ideation? Denies  Agreement Not to Harm Self Yes  Description of Agreement verbal  Danger to Others  Danger to Others None reported or observed
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
## 2023-11-09 NOTE — BHH Suicide Risk Assessment (Addendum)
 B[facility] INPATIENT:  Family/Significant Other Suicide Prevention Education  Suicide Prevention Education:  Education Completed; [name], [facility], [phone], has been identified by the patient as the person(s) who will aid the patient in the event of a mental health crisis (suicidal ideations/suicide attempt).  With written consent from the patient, the family member/significant other has been provided the following suicide prevention education, prior to the and/or following the discharge of the patient.  The suicide prevention education provided includes the following: Suicide risk factors Suicide prevention and interventions National Suicide Hotline telephone number [facility] assessment telephone number [facility] Emergency Assistance 911 [facility] and/or Residential Mobile Crisis Unit telephone number  Request made of family/significant other to: Remove weapons (e.g., guns, rifles, knives), all items previously/currently identified as safety concern.   Remove drugs/medications (over-the-counter, prescriptions, illicit drugs), all items previously/currently identified as a safety concern.  The family member/significant other verbalizes understanding of the suicide prevention education information provided.  The family member/significant other agrees to remove the items of safety concern listed above.  Robin Montgomery 11/09/2023, 11:27 AM

## 2023-11-09 NOTE — Progress Notes (Signed)
 [facility] MD Progress Note  11/09/2023 1:59 PM Robin Montgomery  MRN:  [id number]  Robin Montgomery is a 63 y.o. female admitted: Medicallyfor 10/31/2023 11:32 AM for AMS and noted to have low CBG with EMS to 31. She carries the psychiatric diagnoses of MDD, PTSD, AUD, and stimulant use d/o and has a past medical history of  HTN, IDT2DM, HLD. Patient admitted for overdose on insulin  and became hypoglycemic. Patient was seen by psychiatry and admitted patient to inpatient gero psych unit.   Subjective:  Chart reviewed, case discussed in multidisciplinary meeting, patient seen during rounds.  Patient is noted to be walking in the hallway saying she is trying to stay fit.  She talked to the provider in her room.  She offers no complaints.  Per nursing reports she is taking her medications with no reported side effects.  Patient reports fair appetite and sleep.  She denies any symptoms of depression or anxiety.  She denies auditory/visual hallucinations, denies SI/HI/plan.  She talks about her 3 boyfriends and makes statements that she needs to look good for them.  She reports that she has been calling 1 at a time and talking to them.  Social worker updated the provider that she was able to reach out to her support group and the caseworker will be connecting with the patient immediately after discharge.   Sleep: Fair  Appetite:  Fair  Past Psychiatric History: see h&P Family History: History reviewed. No pertinent family history. Social History:  Social History   Substance and Sexual Activity  Alcohol Use Yes   Alcohol/week: 0.0 standard drinks of alcohol   Comment: every other week      Social History   Substance and Sexual Activity  Drug Use Yes   Types: Cocaine, Marijuana    Social History   Socioeconomic History   Marital status: Single    Spouse name: Not on file   Number of children: Not on file   Years of education: Not on file   Highest education level: Not on file  Occupational  History   Not on file  Tobacco Use   Smoking status: Every Day    Current packs/day: 0.25    Average packs/day: 0.3 packs/day for 51.4 years (12.9 ttl pk-yrs)    Types: Cigarettes    Start date: 38   Smokeless tobacco: Former    Types: Snuff   Tobacco comments:    Patient states she is trying to decrease smoking.  States she smokes 4 - 5 cigarettes daily  Vaping Use   Vaping status: Never Used  Substance and Sexual Activity   Alcohol use: Yes    Alcohol/week: 0.0 standard drinks of alcohol    Comment: every other week    Drug use: Yes    Types: Cocaine, Marijuana   Sexual activity: Not on file  Other Topics Concern   Not on file  Social History Narrative   Smoker   Cocaine use off and on for past 20 years   Living in motel   Living with 63 yo and 103-something year old child    Social Drivers of Health   Financial Resource Strain: Medium Risk (01/19/2021)   Overall Financial Resource Strain (CARDIA)    Difficulty of Paying Living Expenses: Somewhat hard  Food Insecurity: No Food Insecurity (11/07/2023)   Hunger Vital Sign    Worried About Running Out of Food in the Last Year: Never true    Ran Out of Food in the Last  Year: Never true  Transportation Needs: No Transportation Needs (11/07/2023)   PRAPARE - Administrator, Civil Service (Medical): No    Lack of Transportation (Non-Medical): No  Physical Activity: Not on file  Stress: Stress Concern Present (01/19/2021)   [employer] of Occupational Health - Occupational Stress Questionnaire    Feeling of Stress : To some extent  Social Connections: Unknown (10/19/2021)   [facility]   Social Network    Social Network: Not on file   Past Medical History:  Past Medical History:  Diagnosis Date   Depression    Diabetes mellitus without complication (HCC)    Hypertension    Nicotine  use 11/01/2023   Suicide attempt (HCC) 11/03/2023    Past Surgical History:  Procedure  Laterality Date   CHOLECYSTECTOMY      Current Medications: Current Facility-Administered Medications  Medication Dose Route Frequency Provider Last Rate Last Admin   acetaminophen  (TYLENOL ) tablet 650 mg  650 mg Oral Q6H PRN Lissa Riding, NP   650 mg at 11/08/23 1250   alum & mag hydroxide-simeth (MAALOX/MYLANTA) 200-200-20 MG/5ML suspension 30 mL  30 mL Oral Q4H PRN Lissa Riding, NP       folic acid  (FOLVITE ) tablet 1 mg  1 mg Oral Daily Lissa Riding, NP   1 mg at 11/09/23 0915   glimepiride  (AMARYL ) tablet 1 mg  1 mg Oral BID WC Lissa Riding, NP   1 mg at 11/09/23 0915   insulin  aspart (novoLOG ) injection 0-9 Units  0-9 Units Subcutaneous TID WC Lissa Riding, NP   3 Units at 11/09/23 1610   magnesium  hydroxide (MILK OF MAGNESIA) suspension 30 mL  30 mL Oral Daily PRN Lissa Riding, NP       metFORMIN  (GLUCOPHAGE -XR) 24 hr tablet 500 mg  500 mg Oral BID WC Daeveon Zweber, MD   500 mg at 11/08/23 1654   multivitamin with minerals tablet 1 tablet  1 tablet Oral Daily Lissa Riding, NP   1 tablet at 11/09/23 0915   nicotine  (NICODERM CQ  - dosed in mg/24 hours) patch 21 mg  21 mg Transdermal Daily Lissa Riding, NP   21 mg at 11/09/23 0916   OLANZapine (ZYPREXA) tablet 10 mg  10 mg Oral BID PRN Lissa Riding, NP       Or   OLANZapine (ZYPREXA) injection 10 mg  10 mg Intramuscular BID PRN Lord, Jamison Y, NP       risperiDONE  (RISPERDAL ) tablet 1 mg  1 mg Oral QHS Musab Wingard, MD   1 mg at 11/08/23 2124   sertraline  (ZOLOFT ) tablet 50 mg  50 mg Oral Daily Lord, Jamison Y, NP   50 mg at 11/09/23 9604    Lab Results:  Results for orders placed or performed during the hospital encounter of 11/07/23 (from the past 48 hours)  Glucose, capillary     Status: Abnormal   Collection Time: 11/07/23  9:42 PM  Result Value Ref Range   Glucose-Capillary 169 (H) 70 - 99 mg/dL    Comment: Glucose reference range applies only to samples taken after fasting for at least 8 hours.   Glucose, capillary     Status: Abnormal   Collection Time: 11/08/23 11:36 AM  Result Value Ref Range   Glucose-Capillary 112 (H) 70 - 99 mg/dL    Comment: Glucose reference range applies only to samples taken after fasting for at least 8 hours.  Glucose, capillary     Status: None   Collection Time: 11/08/23  4:22 PM  Result Value Ref Range   Glucose-Capillary 87 70 - 99 mg/dL    Comment: Glucose reference range applies only to samples taken after fasting for at least 8 hours.  Glucose, capillary     Status: Abnormal   Collection Time: 11/08/23  8:01 PM  Result Value Ref Range   Glucose-Capillary 141 (H) 70 - 99 mg/dL    Comment: Glucose reference range applies only to samples taken after fasting for at least 8 hours.  Glucose, capillary     Status: Abnormal   Collection Time: 11/09/23  7:26 AM  Result Value Ref Range   Glucose-Capillary 217 (H) 70 - 99 mg/dL    Comment: Glucose reference range applies only to samples taken after fasting for at least 8 hours.  Glucose, capillary     Status: Abnormal   Collection Time: 11/09/23 11:19 AM  Result Value Ref Range   Glucose-Capillary 66 (L) 70 - 99 mg/dL    Comment: Glucose reference range applies only to samples taken after fasting for at least 8 hours.    Blood Alcohol level:  Lab Results  Component Value Date   [facility] <15 10/31/2023   ETH <10 11/15/2022    Metabolic Disorder Labs: Lab Results  Component Value Date   HGBA1C 9.5 (H) 11/02/2023   MPG 225.95 11/02/2023   MPG 214.47 11/16/2022   No results found for: "PROLACTIN" Lab Results  Component Value Date   CHOL 231 (H) 11/02/2023   TRIG 118 11/02/2023   HDL 66 11/02/2023   CHOLHDL 3.5 11/02/2023   VLDL 24 11/02/2023   LDLCALC 141 (H) 11/02/2023   LDLCALC 102 (H) 02/28/2022    Physical Findings: AIMS:  , ,  ,  ,    CIWA:    COWS:      Psychiatric Specialty Exam:  Presentation  General Appearance:  Casual  Eye  Contact: Fair  Speech: Pressured  Speech Volume: Increased    Mood and Affect  Mood: Euphoric  Affect: Appropriate   Thought Process  Thought Processes: Irrevelant  Descriptions of Associations:Intact  Orientation:Full (Time, Place and Person)  Thought Content:linear Hallucinations:denies Ideas of Reference:None  Suicidal Thoughts:Suicidal Thoughts: No  Homicidal Thoughts:Homicidal Thoughts: No   Sensorium  Memory: Immediate Fair; Recent Fair; Remote Fair  Judgment: Impaired  Insight: Lacking   Executive Functions  Concentration: Fair  Attention Span: Fair  Recall: [employer] of Knowledge: Fair  Language: Fair   Psychomotor Activity  Psychomotor Activity: Psychomotor Activity: Normal  Musculoskeletal: Strength & Muscle Tone: within normal limits Gait & Station: normal Assets  Assets: Manufacturing systems engineer; Desire for Improvement; Housing; Resilience    Physical Exam: Physical Exam ROS Blood pressure 122/75, pulse 100, temperature 97.9 F (36.6 C), resp. rate 18, height 5\' 1"  (1.549 m), weight 56 kg, SpO2 100%. Body mass index is 23.34 kg/m.  Diagnosis: Principal Problem:   Major depressive disorder, recurrent severe without psychotic features [facility]) Clinical Decision Making:Patient with hx of depression, polysubstance use admitted after lethal overdose on Insulin  as a suicide attempt.Patient lacks insight into her mental health problems and minimizes her symptoms and requesting to be discharged. Patient needs inpatient psychiatric hospitalization for stabilization.   Treatment Plan Summary:   Safety and Monitoring:             -- Voluntary admission to inpatient psychiatric unit for safety, stabilization and treatment             --  Daily contact with patient to assess and evaluate symptoms and progress in treatment             -- Patient's case to be discussed in multi-disciplinary team meeting             -- Observation  Level: q15 minute checks             -- Vital signs:  q12 hours             -- Precautions: suicide, elopement, and assault   2. Psychiatric Diagnoses and Treatment:                Risperdal   1mg  at bedtime; zoloft  50 mg daily.   -- The risks/benefits/side-effects/alternatives to this medication were discussed in detail with the patient and time was given for questions. The patient consents to medication trial.                -- Metabolic profile and EKG monitoring obtained while on an atypical antipsychotic (BMI: Lipid Panel: HbgA1c: QTc:)              -- Encouraged patient to participate in unit milieu and in scheduled group therapies                            3. Medical Issues Being Addressed:   Managing blood sugars- diabetic educator involved  4. Discharge Planning:   -- Social work and case management to assist with discharge planning and identification of hospital follow-up needs prior to discharge  -- Estimated LOS: 3-4 days  Terrina Docter, MD 11/09/2023, 1:59 PM

## 2023-11-09 NOTE — Group Note (Signed)
 Date:  11/09/2023 Time:  9:11 PM  Group Topic/Focus:  Healthy Communication:   The focus of this group is to discuss communication, barriers to communication, as well as healthy ways to communicate with others.    Participation Level:  Active  Participation Quality:  Appropriate  Affect:  Appropriate  Cognitive:  Appropriate  Insight: Good  Engagement in Group:  Engaged  Modes of Intervention:  Discussion  Additional Comments:    Lynette Saras 11/09/2023, 9:11 PM

## 2023-11-09 NOTE — Progress Notes (Signed)
   11/08/23 2200  Psych Admission Type (Psych Patients Only)  Admission Status Voluntary  Psychosocial Assessment  Patient Complaints None  Eye Contact Brief  Facial Expression Animated  Affect Appropriate to circumstance  Speech Logical/coherent  Interaction Assertive  Motor Activity Fidgety  Appearance/Hygiene Unremarkable  Behavior Characteristics Appropriate to situation  Mood Pleasant  Thought Process  Coherency WDL  Content WDL  Delusions None reported or observed  Perception WDL  Hallucination None reported or observed  Judgment WDL  Confusion None  Danger to Self  Current suicidal ideation? Denies (Denies)  Danger to Others  Danger to Others None reported or observed   Pt received all medications as ordered.  Resting in bed at this time.  All safety precautions and checks maintained per orders.

## 2023-11-09 NOTE — Progress Notes (Signed)
   11/09/23 1657  Psych Admission Type (Psych Patients Only)  Admission Status Voluntary  Psychosocial Assessment  Patient Complaints Other (Comment) (Toothache)  Eye Contact Brief  Facial Expression Animated  Affect Appropriate to circumstance  Speech Logical/coherent  Interaction Assertive  Motor Activity Fidgety  Appearance/Hygiene Unremarkable  Behavior Characteristics Appropriate to situation  Mood Pleasant  Aggressive Behavior  Effect No apparent injury  Thought Process  Coherency WDL  Content WDL  Delusions None reported or observed  Perception WDL  Hallucination None reported or observed  Judgment WDL  Confusion None  Danger to Self  Current suicidal ideation? Denies  Agreement Not to Harm Self Yes  Description of Agreement Verbal  Danger to Others  Danger to Others None reported or observed

## 2023-11-09 NOTE — Progress Notes (Signed)
 BCG noted, patient is eating lunch, no insulin  needed. Patient denies any needs, denies hypoglycemia symptoms.

## 2023-11-09 NOTE — Group Note (Signed)
 Date:  11/09/2023 Time:  10:37 AM  Group Topic/Focus:  Movement Therapy    Participation Level:  Active  Participation Quality:  Appropriate  Affect:  Appropriate  Cognitive:  Appropriate  Insight: Appropriate  Engagement in Group:  Engaged  Modes of Intervention:  Activity  Additional Comments:  none  Merton Abts 11/09/2023, 10:37 AM

## 2023-11-09 NOTE — Group Note (Signed)
 Recreation Therapy Group Note   Group Topic:Coping Skills  Group Date: 11/09/2023 Start Time: 1400 End Time: 1445 Facilitators: Yvonna Herder, CTRS Location: Craft Room  Group Description: Mind Map.  Patient was provided a blank template of a diagram with 32 blank boxes in a tiered system, branching from the center (similar to a bubble chart). LRT directed patients to label the middle of the diagram "Coping Skills". LRT and patients then came up with 8 different coping skills as examples. Pt were directed to record their coping skills in the 2nd tier boxes closest to the center.  Patients would then share their coping skills with the group as LRT wrote them out. LRT gave a handout of 99 different coping skills at the end of group.   Goal Area(s) Addressed: Patients will be able to define "coping skills". Patient will identify new coping skills.  Patient will increase communication.   Affect/Mood: Appropriate   Participation Level: Active and Engaged   Participation Quality: Independent   Behavior: Alert, Calm, and Cooperative   Speech/Thought Process: Coherent   Insight: Good   Judgement: Good   Modes of Intervention: Clarification, Education, Exploration, Worksheet, and Writing   Patient Response to Interventions:  Attentive, Engaged, Interested , and Receptive   Education Outcome:  Acknowledges education   Clinical Observations/Individualized Feedback: Robin Montgomery was active in their participation of session activities and group discussion. Pt identified "pray, read a book, clean" as coping skills. Pt interacted well with LRT and peers duration of session.    Plan: Continue to engage patient in RT group sessions 2-3x/week.   Deatrice Factor, LRT, CTRS 11/09/2023 5:12 PM

## 2023-11-10 DIAGNOSIS — F332 Major depressive disorder, recurrent severe without psychotic features: Secondary | ICD-10-CM | POA: Diagnosis not present

## 2023-11-10 LAB — GLUCOSE, CAPILLARY
Glucose-Capillary: 220 mg/dL — ABNORMAL HIGH (ref 70–99)
Glucose-Capillary: 85 mg/dL (ref 70–99)
Glucose-Capillary: 72 mg/dL (ref 70–99)
Glucose-Capillary: 129 mg/dL — ABNORMAL HIGH (ref 70–99)

## 2023-11-10 NOTE — Progress Notes (Signed)
   11/10/23 0559  15 Minute Checks  Location Bathroom/Shower  Visual Appearance Calm  Behavior Composed  Sleep (Behavioral Health Patients Only)  Calculate sleep? (Click Yes once per 24 hr at 0600 safety check) Yes  Documented sleep last 24 hours 9

## 2023-11-10 NOTE — Progress Notes (Signed)
 Patient is a voluntary admission to [facility] for MDD with SI attempt with her insulin  after using crack and ETOH.   Patient states she is no long SI,nor HI, AVH, anxiety or depression and is looking forward to being discharged.  Interacts well with peers and staff. CBG's ordered. Noted to be 72 at dinner - glimepiride  held and no coverage needed.  Will continue to monitor.

## 2023-11-10 NOTE — Progress Notes (Signed)
   11/10/23 1500  Spiritual Encounters  Type of Visit Follow up  Care provided to: Patient  Reason for visit Routine spiritual support  OnCall Visit No   Chaplain followed up with patient because patient shared that she'd like the Chaplain to come daily for prayer.  Patient shared her plans for recovery.  She said her goal is to find a Church home that feels welcoming.  She also shared that another patient wanted to see the Chaplain.  Patient seemed encouraged and strong in spirit.  Patient said that she would like to join some forms of Group Therapy like NA, etc.  Patient will look into these options.  Chaplain offered prayer and shared she would let the evening Chaplain know to tell the Saturday Chaplain to offer her prayer during their shift and on Sunday, before discharge, patient will ask the Nurse to page Chaplain.  Rev. Rana M. Nolon Baxter, M.Div. Chaplain Resident [facility]

## 2023-11-10 NOTE — Group Note (Signed)
 Physical/Occupational Therapy Group Note  Group Topic: Pain Management and Coping   Group Date: 11/10/2023 Start Time: 1300 End Time: 1345 Facilitators: Hilma Lucks, OT   Group Description:  Group discussed impact of chronic/acute pain on safety and independence with functional tasks and impact on mental health.  Identified and discussed any previously learned or implemented strategies used.  Discussed and reviewed cognitive behavioral pain coping strategies to address/improve overall management of pain. Discussed relaxation, distraction techniques, cognitive restructuring, activity pacing/energy conservation, environment/home safety modifications, and role of sleep and sleep hygiene. Allowed time for questions and further discussion.   Therapeutic Goal(s):     Identify and discuss previously utilized pain coping strategies and implications of pain on function/well-being    Identify and discuss implementing new cognitive behavioral pain coping strategies into daily routines    Demonstrate understanding and performance of learned cognitive behavioral pain coping strategies.  Individual Participation: Pt engaged throughout. Did leave group briefly to use the bathroom. Pt appeared restless but interactive with and without prompting. Participated in group discussion, education, and activity portions of the session.   Participation Level: Active and Engaged   Participation Quality: Independent   Behavior: Alert, Interactive , and Restless   Speech/Thought Process: Coherent, Directed, Focused, and Relevant   Affect/Mood: Appropriate and Elevated   Insight: Fair   Judgement: Fair   Modes of Intervention: Activity, Clarification, Discussion, Education, Exploration, Problem-solving, and Socialization  Patient Response to Interventions:  Attentive, Engaged, Interested , and Receptive   Plan: Continue to engage patient in PT/OT groups 1 - 2x/week.  See Beharry R., MPH, MS, OTR/L ascom  [phone] 11/10/23, 4:15 PM

## 2023-11-10 NOTE — Group Note (Signed)
 Date:  11/10/2023 Time:  9:29 PM  Group Topic/Focus:  Self Care:   The focus of this group is to help patients understand the importance of self-care in order to improve or restore emotional, physical, spiritual, interpersonal, and financial health.  MHT made introductions, explained the 15 minute rounds. Prepared patients for night checks and addressed expectations. Asked about preference of doors, explained if shut completely, they would hear them clicking during 15 minute checks. Explained MHT's needed to check breathing at night, but would try to be as quiet as possible. Discussed wellness plan for discharge. A member expressed concerns about basic needs. MHT informed of the 211 resource line. MHT called the 211 line to demonstrate how to find natural resources once discharged. MHT asked about food resources for Saturday. MHT was given 3 food resources for Saturday [facility] [phone] [employer] [employer] [phone] [place] [facility] [phone] MHT modeled how to find resources in the community. MHT answered questions and provided supportive counseling. MHT reviewed the steps of how to find resources once discharged back into the community.  Robin Montgomery Robin Montgomery   Participation Level:  Active  Participation Quality:  Appropriate  Affect:  Appropriate  Cognitive:  Appropriate  Insight: Appropriate  Engagement in Group:  Engaged  Modes of Intervention:  Discussion  Additional Comments:    Titus Formosa 11/10/2023, 9:29 PM

## 2023-11-10 NOTE — Inpatient Diabetes Management (Signed)
 Inpatient Diabetes Program Recommendations  AACE/ADA: New Consensus Statement on Inpatient Glycemic Control   Target Ranges:  Prepandial:   less than 140 mg/dL      Peak postprandial:   less than 180 mg/dL (1-2 hours)      Critically ill patients:  140 - 180 mg/dL    Latest Reference Range & Units 11/09/23 07:26 11/09/23 09:14 11/09/23 11:19 11/09/23 16:02 11/09/23 20:17 11/10/23 07:24  Glucose-Capillary 70 - 99 mg/dL 161 (H)   Novolog  3 units 66 (L) 89 178 (H) 220 (H)   Review of Glycemic Control  Diabetes history: DM2 Outpatient Diabetes medications: Levemir  30 units BID, Humalog 10 units TID, Metformin  500 mg daily, Jardiance  10 mg daily Current orders for Inpatient glycemic control: Amaryl  1 mg BID, Metformin  XR 500 mg BID,  Novolog  0-9 units TID with meals   Inpatient Diabetes Program Recommendations:     Inpatient DM: Noted glucose down to 66 mg/dl on 0/9/60 at 45:40. Hypoglycemia likely due to late administration of Novolog  as CBG of 217 mg/dl obtained at 9:81 am and Novolog  3 units not given until 9:14 am. CBG should have been rechecked since it was over 60 mins from prior CBG until Novolog  was given.   Outpatient DM: Due to insulin  overdose, may want to discharge patient on oral DM medications if possible.   NOTE: Patient admitted to [facility] with suicide attempt via insulin  overdose. Inpatient diabetes coordinator spoke with patient on 11/01/23.   Thanks, Beacher Limerick, RN, MSN, CDCES Diabetes Coordinator Inpatient Diabetes Program [phone] (Team Pager from 8am to 5pm)

## 2023-11-10 NOTE — Group Note (Signed)
 Date:  11/10/2023 Time:  11:03 AM  Group Topic/Focus:  Goals Group:   The focus of this group is to help patients establish daily goals to achieve during treatment and duration of their stay. Patients also did guided imagery as well.    Participation Level:  Active  Participation Quality:  Appropriate  Affect:  Appropriate  Cognitive:  Appropriate  Insight: Appropriate  Engagement in Group:  Engaged  Modes of Intervention:  Activity and Discussion  Additional Comments:    Linnell Richardson 11/10/2023, 11:03 AM

## 2023-11-10 NOTE — Progress Notes (Signed)
   11/10/23 0845  Spiritual Encounters  Type of Visit Follow up  Care provided to: Patient  Conversation partners present during encounter Nurse  Reason for visit Routine spiritual support  OnCall Visit Yes   Chaplain saw patient while on the Unit.  Patient wanted to share with Chaplain that the prayer the Chaplain prayed the day before uplifted her.  Patient reported that she slept very well and didn't have any disturbing dreams.  Chaplain celebrated with patient.  Patient shared that her eye is hurting.  Chaplain said she'd be back later on to visit the Unit and Chaplain will check in then.  Rev. Rana M. Nolon Baxter, M.Div. Chaplain Resident [facility]

## 2023-11-10 NOTE — Plan of Care (Signed)
  Problem: Education: Goal: Knowledge of General Education information will improve Description: Including pain rating scale, medication(s)/side effects and non-pharmacologic comfort measures Outcome: Progressing   Problem: Health Behavior/Discharge Planning: Goal: Ability to manage health-related needs will improve Outcome: Progressing   Problem: Clinical Measurements: Goal: Ability to maintain clinical measurements within normal limits will improve Outcome: Progressing Goal: Will remain free from infection Outcome: Progressing Goal: Diagnostic test results will improve Outcome: Progressing Goal: Respiratory complications will improve Outcome: Progressing Goal: Cardiovascular complication will be avoided Outcome: Progressing   Problem: Activity: Goal: Risk for activity intolerance will decrease Outcome: Progressing   Problem: Nutrition: Goal: Adequate nutrition will be maintained Outcome: Progressing   Problem: Coping: Goal: Level of anxiety will decrease Outcome: Progressing   Problem: Elimination: Goal: Will not experience complications related to bowel motility Outcome: Progressing Goal: Will not experience complications related to urinary retention Outcome: Progressing   Problem: Pain Managment: Goal: General experience of comfort will improve and/or be controlled Outcome: Progressing   Problem: Safety: Goal: Ability to remain free from injury will improve Outcome: Progressing   Problem: Skin Integrity: Goal: Risk for impaired skin integrity will decrease Outcome: Progressing   Problem: Activity: Goal: Will identify at least one activity in which they can participate Outcome: Progressing   Problem: Coping: Goal: Ability to identify and develop effective coping behavior will improve Outcome: Progressing Goal: Ability to interact with others will improve Outcome: Progressing Goal: Demonstration of participation in decision-making regarding own care will  improve Outcome: Progressing Goal: Ability to use eye contact when communicating with others will improve Outcome: Progressing   Problem: Health Behavior/Discharge Planning: Goal: Identification of resources available to assist in meeting health care needs will improve Outcome: Progressing   Problem: Self-Concept: Goal: Will verbalize positive feelings about self Outcome: Progressing   Problem: Education: Goal: Knowledge of [place] [facility] Education information/materials will improve Outcome: Progressing Goal: Emotional status will improve Outcome: Progressing Goal: Mental status will improve Outcome: Progressing Goal: Verbalization of understanding the information provided will improve Outcome: Progressing   Problem: Activity: Goal: Interest or engagement in activities will improve Outcome: Progressing Goal: Sleeping patterns will improve Outcome: Progressing   Problem: Coping: Goal: Ability to verbalize frustrations and anger appropriately will improve Outcome: Progressing Goal: Ability to demonstrate self-control will improve Outcome: Progressing   Problem: Health Behavior/Discharge Planning: Goal: Identification of resources available to assist in meeting health care needs will improve Outcome: Progressing Goal: Compliance with treatment plan for underlying cause of condition will improve Outcome: Progressing   Problem: Physical Regulation: Goal: Ability to maintain clinical measurements within normal limits will improve Outcome: Progressing   Problem: Safety: Goal: Periods of time without injury will increase Outcome: Progressing

## 2023-11-10 NOTE — Progress Notes (Signed)
 [facility] MD Progress Note  11/10/2023 9:03 PM Robin Montgomery  MRN:  [id number]  Robin Montgomery is a 63 y.o. female admitted: Medicallyfor 10/31/2023 11:32 AM for AMS and noted to have low CBG with EMS to 31. She carries the psychiatric diagnoses of MDD, PTSD, AUD, and stimulant use d/o and has a past medical history of  HTN, IDT2DM, HLD. Patient admitted for overdose on insulin  and became hypoglycemic. Patient was seen by psychiatry and admitted patient to inpatient gero psych unit.   Subjective:  Chart reviewed, case discussed in multidisciplinary meeting, patient seen during rounds.  Patient is noted to be in a good mood, participating in groups and getting along with peers.  She offers no complaints.  She was excited to know about her discharge on Sunday.  She reports that her cousin will be home cooking for her.  She denies SI/HI/plan and denies hallucinations.  She reports tolerating Risperdal  very well and denies having any side effects including EPS.  She remains future oriented and is willing to participate and go back to her peers support group.  She informed the provider that her caseworker will be meeting her at her home on Monday.  Sleep: Fair  Appetite:  Fair  Past Psychiatric History: see h&P Family History: History reviewed. No pertinent family history. Social History:  Social History   Substance and Sexual Activity  Alcohol Use Yes   Alcohol/week: 0.0 standard drinks of alcohol   Comment: every other week      Social History   Substance and Sexual Activity  Drug Use Yes   Types: Cocaine, Marijuana    Social History   Socioeconomic History   Marital status: Single    Spouse name: Not on file   Number of children: Not on file   Years of education: Not on file   Highest education level: Not on file  Occupational History   Not on file  Tobacco Use   Smoking status: Every Day    Current packs/day: 0.25    Average packs/day: 0.3 packs/day for 51.4 years (12.9 ttl  pk-yrs)    Types: Cigarettes    Start date: 33   Smokeless tobacco: Former    Types: Snuff   Tobacco comments:    Patient states she is trying to decrease smoking.  States she smokes 4 - 5 cigarettes daily  Vaping Use   Vaping status: Never Used  Substance and Sexual Activity   Alcohol use: Yes    Alcohol/week: 0.0 standard drinks of alcohol    Comment: every other week    Drug use: Yes    Types: Cocaine, Marijuana   Sexual activity: Not on file  Other Topics Concern   Not on file  Social History Narrative   Smoker   Cocaine use off and on for past 20 years   Living in motel   Living with 63 yo and 75-something year old child    Social Drivers of Health   Financial Resource Strain: Medium Risk (01/19/2021)   Overall Financial Resource Strain (CARDIA)    Difficulty of Paying Living Expenses: Somewhat hard  Food Insecurity: No Food Insecurity (11/07/2023)   Hunger Vital Sign    Worried About Running Out of Food in the Last Year: Never true    Ran Out of Food in the Last Year: Never true  Transportation Needs: No Transportation Needs (11/07/2023)   PRAPARE - Administrator, Civil Service (Medical): No    Lack of Transportation (  Non-Medical): No  Physical Activity: Not on file  Stress: Stress Concern Present (01/19/2021)   [employer] of Occupational Health - Occupational Stress Questionnaire    Feeling of Stress : To some extent  Social Connections: Unknown (10/19/2021)   Received from [facility]   Social Network    Social Network: Not on file   Past Medical History:  Past Medical History:  Diagnosis Date   Depression    Diabetes mellitus without complication (HCC)    Hypertension    Nicotine  use 11/01/2023   Suicide attempt (HCC) 11/03/2023    Past Surgical History:  Procedure Laterality Date   CHOLECYSTECTOMY      Current Medications: Current Facility-Administered Medications  Medication Dose Route Frequency Provider Last Rate  Last Admin   acetaminophen  (TYLENOL ) tablet 650 mg  650 mg Oral Q6H PRN Lissa Riding, NP   650 mg at 11/10/23 1612   alum & mag hydroxide-simeth (MAALOX/MYLANTA) 200-200-20 MG/5ML suspension 30 mL  30 mL Oral Q4H PRN Lissa Riding, NP       folic acid  (FOLVITE ) tablet 1 mg  1 mg Oral Daily Lord, Jamison Y, NP   1 mg at 11/10/23 6962   glimepiride  (AMARYL ) tablet 1 mg  1 mg Oral BID WC Lord, Jamison Y, NP   1 mg at 11/10/23 9528   insulin  aspart (novoLOG ) injection 0-9 Units  0-9 Units Subcutaneous TID WC Lord, Jamison Y, NP   3 Units at 11/10/23 1202   magnesium  hydroxide (MILK OF MAGNESIA) suspension 30 mL  30 mL Oral Daily PRN Lissa Riding, NP       multivitamin with minerals tablet 1 tablet  1 tablet Oral Daily Lissa Riding, NP   1 tablet at 11/10/23 4132   nicotine  (NICODERM CQ  - dosed in mg/24 hours) patch 21 mg  21 mg Transdermal Daily Lissa Riding, NP   21 mg at 11/10/23 0923   OLANZapine (ZYPREXA) tablet 10 mg  10 mg Oral BID PRN Lissa Riding, NP       Or   OLANZapine (ZYPREXA) injection 10 mg  10 mg Intramuscular BID PRN Lord, Jamison Y, NP       risperiDONE  (RISPERDAL ) tablet 1 mg  1 mg Oral QHS Selestino Nila, MD   1 mg at 11/09/23 2103   sertraline  (ZOLOFT ) tablet 50 mg  50 mg Oral Daily Lord, Jamison Y, NP   50 mg at 11/10/23 4401    Lab Results:  Results for orders placed or performed during the hospital encounter of 11/07/23 (from the past 48 hours)  Glucose, capillary     Status: Abnormal   Collection Time: 11/09/23  7:26 AM  Result Value Ref Range   Glucose-Capillary 217 (H) 70 - 99 mg/dL    Comment: Glucose reference range applies only to samples taken after fasting for at least 8 hours.  Glucose, capillary     Status: Abnormal   Collection Time: 11/09/23 11:19 AM  Result Value Ref Range   Glucose-Capillary 66 (L) 70 - 99 mg/dL    Comment: Glucose reference range applies only to samples taken after fasting for at least 8 hours.  Glucose, capillary      Status: None   Collection Time: 11/09/23  4:02 PM  Result Value Ref Range   Glucose-Capillary 89 70 - 99 mg/dL    Comment: Glucose reference range applies only to samples taken after fasting for at least 8 hours.  Glucose, capillary  Status: Abnormal   Collection Time: 11/09/23  8:17 PM  Result Value Ref Range   Glucose-Capillary 178 (H) 70 - 99 mg/dL    Comment: Glucose reference range applies only to samples taken after fasting for at least 8 hours.   Comment 1 Notify RN   Glucose, capillary     Status: Abnormal   Collection Time: 11/10/23  7:24 AM  Result Value Ref Range   Glucose-Capillary 220 (H) 70 - 99 mg/dL    Comment: Glucose reference range applies only to samples taken after fasting for at least 8 hours.  Glucose, capillary     Status: None   Collection Time: 11/10/23 11:07 AM  Result Value Ref Range   Glucose-Capillary 85 70 - 99 mg/dL    Comment: Glucose reference range applies only to samples taken after fasting for at least 8 hours.  Glucose, capillary     Status: None   Collection Time: 11/10/23  4:17 PM  Result Value Ref Range   Glucose-Capillary 72 70 - 99 mg/dL    Comment: Glucose reference range applies only to samples taken after fasting for at least 8 hours.  Glucose, capillary     Status: Abnormal   Collection Time: 11/10/23  7:45 PM  Result Value Ref Range   Glucose-Capillary 129 (H) 70 - 99 mg/dL    Comment: Glucose reference range applies only to samples taken after fasting for at least 8 hours.    Blood Alcohol level:  Lab Results  Component Value Date   [facility] <15 10/31/2023   ETH <10 11/15/2022    Metabolic Disorder Labs: Lab Results  Component Value Date   HGBA1C 9.5 (H) 11/02/2023   MPG 225.95 11/02/2023   MPG 214.47 11/16/2022   No results found for: "PROLACTIN" Lab Results  Component Value Date   CHOL 231 (H) 11/02/2023   TRIG 118 11/02/2023   HDL 66 11/02/2023   CHOLHDL 3.5 11/02/2023   VLDL 24 11/02/2023   LDLCALC 141 (H)  11/02/2023   LDLCALC 102 (H) 02/28/2022    Physical Findings: AIMS:  , ,  ,  ,    CIWA:    COWS:      Psychiatric Specialty Exam:  Presentation  General Appearance:  Casual  Eye Contact: Fair  Speech: Pressured  Speech Volume: Increased    Mood and Affect  Mood: euthymic Affect: Appropriate   Thought Process  Thought Processes: linear Descriptions of Associations:Intact  Orientation:Full (Time, Place and Person)  Thought Content:linear Hallucinations:denies Ideas of Reference:None  Suicidal Thoughts:denies  Homicidal Thoughts:denies   Sensorium  Memory: Immediate Fair; Recent Fair; Remote Fair  Judgment: improved Insight: improved  Executive Functions  Concentration: Fair  Attention Span: Fair  Recall: [employer] of Knowledge: Fair  Language: Fair   Psychomotor Activity  Psychomotor Activity: No data recorded  Musculoskeletal: Strength & Muscle Tone: within normal limits Gait & Station: normal Assets  Assets: Manufacturing systems engineer; Desire for Improvement; Housing; Resilience    Physical Exam: Physical Exam ROS Blood pressure 130/71, pulse 92, temperature (!) 97.2 F (36.2 C), resp. rate 18, height 5\' 1"  (1.549 m), weight 56 kg, SpO2 100%. Body mass index is 23.34 kg/m.  Diagnosis: Principal Problem:   Major depressive disorder, recurrent severe without psychotic features [facility]) Clinical Decision Making:Patient with hx of depression, polysubstance use admitted after lethal overdose on Insulin  as a suicide attempt.Patient lacks insight into her mental health problems and minimizes her symptoms and requesting to be discharged. Patient needs inpatient psychiatric  hospitalization for stabilization.   Treatment Plan Summary:   Safety and Monitoring:             -- Voluntary admission to inpatient psychiatric unit for safety, stabilization and treatment             -- Daily contact with patient to assess and evaluate  symptoms and progress in treatment             -- Patient's case to be discussed in multi-disciplinary team meeting             -- Observation Level: q15 minute checks             -- Vital signs:  q12 hours             -- Precautions: suicide, elopement, and assault   2. Psychiatric Diagnoses and Treatment:                Risperdal   1mg  at bedtime; zoloft  50 mg daily.   -- The risks/benefits/side-effects/alternatives to this medication were discussed in detail with the patient and time was given for questions. The patient consents to medication trial.                -- Metabolic profile and EKG monitoring obtained while on an atypical antipsychotic (BMI: Lipid Panel: HbgA1c: QTc:)              -- Encouraged patient to participate in unit milieu and in scheduled group therapies                            3. Medical Issues Being Addressed:   Managing blood sugars- diabetic educator involved  4. Discharge Planning:   -- Social work and case management to assist with discharge planning and identification of hospital follow-up needs prior to discharge  -- Estimated LOS: 3-4 days  Dashonda Bonneau, MD 11/10/2023, 9:03 PM

## 2023-11-10 NOTE — Group Note (Signed)
 Recreation Therapy Group Note   Group Topic:Other  Group Date: 11/10/2023 Start Time: 1400 End Time: 1455 Facilitators: Deatrice Factor, LRT, CTRS Location: Dayroom  Group Description: Positive Affirmation Bingo. LRT and patients played multiple games of Bingo with music playing in the background. LRT and pts discussed what a positive affirmation is, the importance of speaking kindly to yourself, and the use of this as a coping skill.   Goal Area(s) Addressed: Patient will learn positive affirmations.  Patient will engage in recreation activity.  Patient will increase communication.    Affect/Mood: Appropriate   Participation Level: Active and Engaged   Participation Quality: Independent   Behavior: Appropriate, Calm, and Cooperative   Speech/Thought Process: Coherent   Insight: Good   Judgement: Good   Modes of Intervention: Competitive Play   Patient Response to Interventions:  Attentive, Engaged, Interested , and Receptive   Education Outcome:  Acknowledges education   Clinical Observations/Individualized Feedback: Robin Montgomery was active in their participation of session activities and group discussion. Pt won a Facilities manager and received a diet coke as her prize, as approved by Charity fundraiser. Pt interacted well with LRT and peers duration of session.    Plan: Continue to engage patient in RT group sessions 2-3x/week.   Deatrice Factor, LRT, CTRS 11/10/2023 4:50 PM

## 2023-11-11 DIAGNOSIS — F332 Major depressive disorder, recurrent severe without psychotic features: Secondary | ICD-10-CM | POA: Diagnosis not present

## 2023-11-11 LAB — GLUCOSE, CAPILLARY
Glucose-Capillary: 210 mg/dL — ABNORMAL HIGH (ref 70–99)
Glucose-Capillary: 81 mg/dL (ref 70–99)
Glucose-Capillary: 118 mg/dL — ABNORMAL HIGH (ref 70–99)
Glucose-Capillary: 213 mg/dL — ABNORMAL HIGH (ref 70–99)

## 2023-11-11 MED ORDER — BENZTROPINE MESYLATE 1 MG PO TABS
0.5000 mg | ORAL_TABLET | Freq: Two times a day (BID) | ORAL | Status: DC
  Administered 2023-11-11 – 2023-11-12 (×2): 0.5 mg via ORAL
  Filled 2023-11-11 (×2): qty 1

## 2023-11-11 NOTE — Progress Notes (Signed)
   11/11/23 0945  Spiritual Encounters  Type of Visit Initial  Care provided to: Patient  Reason for visit Routine spiritual support  OnCall Visit No   Chaplain responded to a spiritual consult for prayer. I met with the patient, Janani who welcomed me into her space and shared some of her journey. She told me about her progress since she started her stay and how grateful she is for that progress. Jowana is scheduled to discharge tomorrow and she is looking forward to that. Lexxie shared a long term goal with me, that she hopes to reconnect with her daughter Melody and help her a little. We explored what that might look like. Falicity pointed out that first she had to strengthen herself before she could visit that goal.   We prayed for her and her daughter. Altair would like to see a chaplain tomorrow before she leaves and she asked for Bible. I told her I would pass this information on.    Clarence Croak [facility]  [phone]

## 2023-11-11 NOTE — Group Note (Signed)
 Date:  11/11/2023 Time:  10:51 AM  Group Topic/Focus:  Movement Therapy    Participation Level:  Active  Participation Quality:  Appropriate  Affect:  Appropriate  Cognitive:  Appropriate  Insight: Appropriate  Engagement in Group:  Engaged  Modes of Intervention:  Activity  Additional Comments:  none  Merton Abts 11/11/2023, 10:51 AM

## 2023-11-11 NOTE — Progress Notes (Signed)
 [facility] MD Progress Note  11/11/2023 8:52 PM SHERL YZAGUIRRE  MRN:  [id number]  ELLIS KOFFLER is a 63 y.o. female admitted: Medicallyfor 10/31/2023 11:32 AM for AMS and noted to have low CBG with EMS to 31. She carries the psychiatric diagnoses of MDD, PTSD, AUD, and stimulant use d/o and has a past medical history of  HTN, IDT2DM, HLD. Patient admitted for overdose on insulin  and became hypoglycemic. Patient was seen by psychiatry and admitted patient to inpatient gero psych unit.   Subjective:  Chart reviewed, case discussed in multidisciplinary meeting, patient seen during rounds.  Patient is noted to be walking in the hallway.  She is noted to have some involuntary movements and discussed adding Cogentin.  Patient agreed with the plan.  She continues to deny SI/HI/plan and reports that she is very excited going home tomorrow as her boyfriend and cousin will be home and cook food for her.  She reports she is a happy camper and enjoys life with friends and parties.  She denies auditory/visual hallucinations.  She has fair appetite and sleep.  Patient is taking her medications with no reported side effects.   Sleep: Fair  Appetite:  Fair  Past Psychiatric History: see h&P Family History: History reviewed. No pertinent family history. Social History:  Social History   Substance and Sexual Activity  Alcohol Use Yes   Alcohol/week: 0.0 standard drinks of alcohol   Comment: every other week      Social History   Substance and Sexual Activity  Drug Use Yes   Types: Cocaine, Marijuana    Social History   Socioeconomic History   Marital status: Single    Spouse name: Not on file   Number of children: Not on file   Years of education: Not on file   Highest education level: Not on file  Occupational History   Not on file  Tobacco Use   Smoking status: Every Day    Current packs/day: 0.25    Average packs/day: 0.3 packs/day for 51.4 years (12.9 ttl pk-yrs)    Types: Cigarettes     Start date: 9   Smokeless tobacco: Former    Types: Snuff   Tobacco comments:    Patient states she is trying to decrease smoking.  States she smokes 4 - 5 cigarettes daily  Vaping Use   Vaping status: Never Used  Substance and Sexual Activity   Alcohol use: Yes    Alcohol/week: 0.0 standard drinks of alcohol    Comment: every other week    Drug use: Yes    Types: Cocaine, Marijuana   Sexual activity: Not on file  Other Topics Concern   Not on file  Social History Narrative   Smoker   Cocaine use off and on for past 20 years   Living in motel   Living with 63 yo and 23-something year old child    Social Drivers of Health   Financial Resource Strain: Medium Risk (01/19/2021)   Overall Financial Resource Strain (CARDIA)    Difficulty of Paying Living Expenses: Somewhat hard  Food Insecurity: No Food Insecurity (11/07/2023)   Hunger Vital Sign    Worried About Running Out of Food in the Last Year: Never true    Ran Out of Food in the Last Year: Never true  Transportation Needs: No Transportation Needs (11/07/2023)   PRAPARE - Administrator, Civil Service (Medical): No    Lack of Transportation (Non-Medical): No  Physical Activity: Not  on file  Stress: Stress Concern Present (01/19/2021)   [employer] of Occupational Health - Occupational Stress Questionnaire    Feeling of Stress : To some extent  Social Connections: Unknown (10/19/2021)   Received from [facility]   Social Network    Social Network: Not on file   Past Medical History:  Past Medical History:  Diagnosis Date   Depression    Diabetes mellitus without complication (HCC)    Hypertension    Nicotine  use 11/01/2023   Suicide attempt (HCC) 11/03/2023    Past Surgical History:  Procedure Laterality Date   CHOLECYSTECTOMY      Current Medications: Current Facility-Administered Medications  Medication Dose Route Frequency Provider Last Rate Last Admin   acetaminophen   (TYLENOL ) tablet 650 mg  650 mg Oral Q6H PRN Lissa Riding, NP   650 mg at 11/11/23 0601   alum & mag hydroxide-simeth (MAALOX/MYLANTA) 200-200-20 MG/5ML suspension 30 mL  30 mL Oral Q4H PRN Lissa Riding, NP       folic acid  (FOLVITE ) tablet 1 mg  1 mg Oral Daily Lissa Riding, NP   1 mg at 11/11/23 0981   glimepiride  (AMARYL ) tablet 1 mg  1 mg Oral BID WC Lissa Riding, NP   1 mg at 11/11/23 1652   insulin  aspart (novoLOG ) injection 0-9 Units  0-9 Units Subcutaneous TID WC Lord, Jamison Y, NP   3 Units at 11/11/23 0800   magnesium  hydroxide (MILK OF MAGNESIA) suspension 30 mL  30 mL Oral Daily PRN Lissa Riding, NP       multivitamin with minerals tablet 1 tablet  1 tablet Oral Daily Lissa Riding, NP   1 tablet at 11/11/23 1914   nicotine  (NICODERM CQ  - dosed in mg/24 hours) patch 21 mg  21 mg Transdermal Daily Lissa Riding, NP   21 mg at 11/11/23 7829   OLANZapine  (ZYPREXA ) tablet 10 mg  10 mg Oral BID PRN Lissa Riding, NP       Or   OLANZapine  (ZYPREXA ) injection 10 mg  10 mg Intramuscular BID PRN Lissa Riding, NP       risperiDONE  (RISPERDAL ) tablet 1 mg  1 mg Oral QHS Kord Monette, MD   1 mg at 11/10/23 2117   sertraline  (ZOLOFT ) tablet 50 mg  50 mg Oral Daily Lord, Jamison Y, NP   50 mg at 11/11/23 5621    Lab Results:  Results for orders placed or performed during the hospital encounter of 11/07/23 (from the past 48 hours)  Glucose, capillary     Status: Abnormal   Collection Time: 11/10/23  7:24 AM  Result Value Ref Range   Glucose-Capillary 220 (H) 70 - 99 mg/dL    Comment: Glucose reference range applies only to samples taken after fasting for at least 8 hours.  Glucose, capillary     Status: None   Collection Time: 11/10/23 11:07 AM  Result Value Ref Range   Glucose-Capillary 85 70 - 99 mg/dL    Comment: Glucose reference range applies only to samples taken after fasting for at least 8 hours.  Glucose, capillary     Status: None   Collection Time:  11/10/23  4:17 PM  Result Value Ref Range   Glucose-Capillary 72 70 - 99 mg/dL    Comment: Glucose reference range applies only to samples taken after fasting for at least 8 hours.  Glucose, capillary     Status: Abnormal   Collection  Time: 11/10/23  7:45 PM  Result Value Ref Range   Glucose-Capillary 129 (H) 70 - 99 mg/dL    Comment: Glucose reference range applies only to samples taken after fasting for at least 8 hours.  Glucose, capillary     Status: Abnormal   Collection Time: 11/11/23  7:15 AM  Result Value Ref Range   Glucose-Capillary 210 (H) 70 - 99 mg/dL    Comment: Glucose reference range applies only to samples taken after fasting for at least 8 hours.  Glucose, capillary     Status: None   Collection Time: 11/11/23 11:25 AM  Result Value Ref Range   Glucose-Capillary 81 70 - 99 mg/dL    Comment: Glucose reference range applies only to samples taken after fasting for at least 8 hours.  Glucose, capillary     Status: Abnormal   Collection Time: 11/11/23  4:13 PM  Result Value Ref Range   Glucose-Capillary 118 (H) 70 - 99 mg/dL    Comment: Glucose reference range applies only to samples taken after fasting for at least 8 hours.  Glucose, capillary     Status: Abnormal   Collection Time: 11/11/23  8:18 PM  Result Value Ref Range   Glucose-Capillary 213 (H) 70 - 99 mg/dL    Comment: Glucose reference range applies only to samples taken after fasting for at least 8 hours.   Comment 1 Notify RN     Blood Alcohol level:  Lab Results  Component Value Date   [facility] <15 10/31/2023   ETH <10 11/15/2022    Metabolic Disorder Labs: Lab Results  Component Value Date   HGBA1C 9.5 (H) 11/02/2023   MPG 225.95 11/02/2023   MPG 214.47 11/16/2022   No results found for: "PROLACTIN" Lab Results  Component Value Date   CHOL 231 (H) 11/02/2023   TRIG 118 11/02/2023   HDL 66 11/02/2023   CHOLHDL 3.5 11/02/2023   VLDL 24 11/02/2023   LDLCALC 141 (H) 11/02/2023   LDLCALC 102 (H)  02/28/2022    Physical Findings: AIMS:  , ,  ,  ,    CIWA:    COWS:      Psychiatric Specialty Exam:  Presentation  General Appearance:  Casual  Eye Contact: Fair  Speech: Pressured  Speech Volume: Increased    Mood and Affect  Mood: euthymic Affect: Appropriate   Thought Process  Thought Processes: linear Descriptions of Associations:Intact  Orientation:Full (Time, Place and Person)  Thought Content:linear Hallucinations:denies Ideas of Reference:None  Suicidal Thoughts:denies  Homicidal Thoughts:denies   Sensorium  Memory: Immediate Fair; Recent Fair; Remote Fair  Judgment: improved Insight: improved  Executive Functions  Concentration: Fair  Attention Span: Fair  Recall: [employer] of Knowledge: Fair  Language: Fair   Psychomotor Activity  Psychomotor Activity: No data recorded  Musculoskeletal: Strength & Muscle Tone: within normal limits Gait & Station: normal Assets  Assets: Manufacturing systems engineer; Desire for Improvement; Housing; Resilience    Physical Exam: Physical Exam ROS Blood pressure 135/72, pulse 99, temperature 97.7 F (36.5 C), resp. rate 18, height 5\' 1"  (1.549 m), weight 56 kg, SpO2 100%. Body mass index is 23.34 kg/m.  Diagnosis: Principal Problem:   Major depressive disorder, recurrent severe without psychotic features [facility]) Clinical Decision Making:Patient with hx of depression, polysubstance use admitted after lethal overdose on Insulin  as a suicide attempt.Patient lacks insight into her mental health problems and minimizes her symptoms and requesting to be discharged. Patient needs inpatient psychiatric hospitalization for stabilization.  Treatment Plan Summary:   Safety and Monitoring:             -- Voluntary admission to inpatient psychiatric unit for safety, stabilization and treatment             -- Daily contact with patient to assess and evaluate symptoms and progress in treatment              -- Patient's case to be discussed in multi-disciplinary team meeting             -- Observation Level: q15 minute checks             -- Vital signs:  q12 hours             -- Precautions: suicide, elopement, and assault   2. Psychiatric Diagnoses and Treatment:                Risperdal   1mg  at bedtime; zoloft  50 mg daily.   -- The risks/benefits/side-effects/alternatives to this medication were discussed in detail with the patient and time was given for questions. The patient consents to medication trial.                -- Metabolic profile and EKG monitoring obtained while on an atypical antipsychotic (BMI: Lipid Panel: HbgA1c: QTc:)              -- Encouraged patient to participate in unit milieu and in scheduled group therapies                            3. Medical Issues Being Addressed:   Managing blood sugars- diabetic educator involved  4. Discharge Planning:   -- Social work and case management to assist with discharge planning and identification of hospital follow-up needs prior to discharge  -- Estimated LOS: 3-4 days  Tyreque Finken, MD 11/11/2023, 8:52 PM

## 2023-11-11 NOTE — Plan of Care (Signed)
  Problem: Education: Goal: Knowledge of General Education information will improve Description: Including pain rating scale, medication(s)/side effects and non-pharmacologic comfort measures Outcome: Adequate for Discharge   Problem: Health Behavior/Discharge Planning: Goal: Ability to manage health-related needs will improve Outcome: Adequate for Discharge   Problem: Clinical Measurements: Goal: Ability to maintain clinical measurements within normal limits will improve Outcome: Adequate for Discharge Goal: Will remain free from infection Outcome: Adequate for Discharge Goal: Diagnostic test results will improve Outcome: Adequate for Discharge Goal: Respiratory complications will improve Outcome: Adequate for Discharge Goal: Cardiovascular complication will be avoided Outcome: Adequate for Discharge   Problem: Activity: Goal: Risk for activity intolerance will decrease Outcome: Adequate for Discharge   Problem: Nutrition: Goal: Adequate nutrition will be maintained Outcome: Adequate for Discharge   Problem: Coping: Goal: Level of anxiety will decrease Outcome: Adequate for Discharge   Problem: Elimination: Goal: Will not experience complications related to bowel motility Outcome: Adequate for Discharge Goal: Will not experience complications related to urinary retention Outcome: Adequate for Discharge   Problem: Pain Managment: Goal: General experience of comfort will improve and/or be controlled Outcome: Adequate for Discharge   Problem: Safety: Goal: Ability to remain free from injury will improve Outcome: Adequate for Discharge   Problem: Skin Integrity: Goal: Risk for impaired skin integrity will decrease Outcome: Adequate for Discharge   Problem: Activity: Goal: Will identify at least one activity in which they can participate Outcome: Adequate for Discharge   Problem: Coping: Goal: Ability to identify and develop effective coping behavior will  improve Outcome: Adequate for Discharge Goal: Ability to interact with others will improve Outcome: Adequate for Discharge Goal: Demonstration of participation in decision-making regarding own care will improve Outcome: Adequate for Discharge Goal: Ability to use eye contact when communicating with others will improve Outcome: Adequate for Discharge   Problem: Health Behavior/Discharge Planning: Goal: Identification of resources available to assist in meeting health care needs will improve Outcome: Adequate for Discharge   Problem: Self-Concept: Goal: Will verbalize positive feelings about self Outcome: Adequate for Discharge   Problem: Education: Goal: Knowledge of [place] [facility] Education information/materials will improve Outcome: Adequate for Discharge Goal: Emotional status will improve Outcome: Adequate for Discharge Goal: Mental status will improve Outcome: Adequate for Discharge Goal: Verbalization of understanding the information provided will improve Outcome: Adequate for Discharge   Problem: Activity: Goal: Interest or engagement in activities will improve Outcome: Adequate for Discharge Goal: Sleeping patterns will improve Outcome: Adequate for Discharge   Problem: Coping: Goal: Ability to verbalize frustrations and anger appropriately will improve Outcome: Adequate for Discharge Goal: Ability to demonstrate self-control will improve Outcome: Adequate for Discharge   Problem: Health Behavior/Discharge Planning: Goal: Identification of resources available to assist in meeting health care needs will improve Outcome: Adequate for Discharge Goal: Compliance with treatment plan for underlying cause of condition will improve Outcome: Adequate for Discharge   Problem: Physical Regulation: Goal: Ability to maintain clinical measurements within normal limits will improve Outcome: Adequate for Discharge   Problem: Safety: Goal: Periods of time without injury  will increase Outcome: Adequate for Discharge

## 2023-11-11 NOTE — Progress Notes (Signed)
   11/11/23 1100  Psych Admission Type (Psych Patients Only)  Admission Status Voluntary  Psychosocial Assessment  Patient Complaints None  Eye Contact Fair  Facial Expression Animated  Affect Appropriate to circumstance  Speech Logical/coherent  Interaction Assertive  Motor Activity Fidgety  Appearance/Hygiene In scrubs  Behavior Characteristics Cooperative  Mood Pleasant  Thought Process  Coherency WDL  Content WDL  Delusions None reported or observed  Perception WDL  Hallucination None reported or observed  Judgment WDL  Confusion None  Danger to Self  Current suicidal ideation? Denies  Agreement Not to Harm Self Yes  Description of Agreement verbal  Danger to Others  Danger to Others None reported or observed

## 2023-11-11 NOTE — Plan of Care (Addendum)
  Problem: Activity: Goal: Risk for activity intolerance will decrease Outcome: Progressing   Problem: Nutrition: Goal: Adequate nutrition will be maintained Outcome: Progressing   Problem: Coping: Goal: Level of anxiety will decrease Outcome: Progressing   Patient received all medications as scheduled, denies SI/HI/AVH, and present in the milieu.  Requested to take a shower after ambulating in the hall independently.  In bed at this time, safety checks continue.    [phone] Patient received prn for c/o HA with stated pain of 6/10.  PRN effective,  patient stated HA relieved pain decreased to 0/10 observed ambulating independently in hallway.

## 2023-11-11 NOTE — Group Note (Signed)
 Date:  11/11/2023 Time:  8:46 PM  Group Topic/Focus:  Overcoming Stress:   The focus of this group is to define stress and help patients assess their triggers.    Participation Level:  Active  Participation Quality:  Appropriate  Affect:  Appropriate  Cognitive:  Appropriate  Insight: Good  Engagement in Group:  Engaged  Modes of Intervention:  Discussion  Additional Comments:    Lynette Saras 11/11/2023, 8:46 PM

## 2023-11-11 NOTE — Group Note (Deleted)
 Date:  11/11/2023 Time:  12:46 PM  Group Topic/Focus:  Developing a Wellness Toolbox:   The focus of this group is to help patients develop a "wellness toolbox" with skills and strategies to promote recovery upon discharge.     Participation Level:  {BHH PARTICIPATION QIHKV:42595}  Participation Quality:  {BHH PARTICIPATION QUALITY:22265}  Affect:  {BHH AFFECT:22266}  Cognitive:  {BHH COGNITIVE:22267}  Insight: {BHH Insight2:20797}  Engagement in Group:  {BHH ENGAGEMENT IN GROUP:22268}  Modes of Intervention:  {BHH MODES OF INTERVENTION:22269}  Additional Comments:  ***  Tiandra Swoveland L Alonnah Lampkins 11/11/2023, 12:46 PM

## 2023-11-12 DIAGNOSIS — F332 Major depressive disorder, recurrent severe without psychotic features: Secondary | ICD-10-CM | POA: Diagnosis not present

## 2023-11-12 LAB — GLUCOSE, CAPILLARY
Glucose-Capillary: 216 mg/dL — ABNORMAL HIGH (ref 70–99)
Glucose-Capillary: 79 mg/dL (ref 70–99)

## 2023-11-12 MED ORDER — INSULIN ASPART 100 UNIT/ML IJ SOLN: 0.0000 [IU] | Freq: Three times a day (TID) | INTRAMUSCULAR | 11 refills | Status: AC

## 2023-11-12 MED ORDER — RISPERIDONE 1 MG PO TABS: 1.0000 mg | ORAL_TABLET | Freq: Every day | ORAL | 0 refills | Status: AC

## 2023-11-12 MED ORDER — NICOTINE 21 MG/24HR TD PT24: 21.0000 mg | MEDICATED_PATCH | Freq: Every day | TRANSDERMAL | 0 refills | Status: AC

## 2023-11-12 MED ORDER — FOLIC ACID 1 MG PO TABS: 1.0000 mg | ORAL_TABLET | Freq: Every day | ORAL | 0 refills | Status: AC

## 2023-11-12 MED ORDER — BENZTROPINE MESYLATE 0.5 MG PO TABS: 0.5000 mg | ORAL_TABLET | Freq: Two times a day (BID) | ORAL | 0 refills | Status: AC

## 2023-11-12 NOTE — Progress Notes (Signed)
   11/12/23 1000  Spiritual Encounters  Type of Visit Initial  Care provided to: Patient  Referral source Chaplain team  Reason for visit Routine spiritual support  OnCall Visit Yes   Chaplain responded to patient request for prayer and a Bible shortly before her discharge.

## 2023-11-12 NOTE — Progress Notes (Signed)
  [facility] Adult Case Management Discharge Plan :  Will you be returning to the same living situation after discharge:  Yes,  Patient is returning to her home. At discharge, do you have transportation home?: No. CSW will provide voucher. Do you have the ability to pay for your medications: Yes,  Patient has the means to obtain her medications.  Release of information consent forms completed and in the chart;  Patient's signature needed at discharge.  Patient to Follow up at:  Follow-up Information     [facility]. Go on 11/12/2023.   Why: Your mental health services begin with [facility] on 11/12/23 according to [name]. They will come to your home as discussed. Contact information: [address] [place], [place] [address]  [phone]                Next level of care provider has access to [facility] Link:yes  Safety Planning and Suicide Prevention discussed: Yes,  SPE Completed on 11/09/2023 with Ms. Racheal Buddle [facility], [phone].     Has patient been referred to the Quitline?: Patient does not use tobacco/nicotine  products  Patient has been referred for addiction treatment: No known substance use disorder.  Darris Emery Knox Cervi, LCSW 11/12/2023, 9:50 AM

## 2023-11-12 NOTE — Progress Notes (Signed)
   11/12/23 0645  15 Minute Checks  Location Bedroom  Visual Appearance Calm  Behavior Composed  Sleep (Behavioral Health Patients Only)  Calculate sleep? (Click Yes once per 24 hr at 0600 safety check) Yes  Documented sleep last 24 hours 6.5

## 2023-11-12 NOTE — BHH Suicide Risk Assessment (Signed)
 [facility] Discharge Suicide Risk Assessment   Principal Problem: Major depressive disorder, recurrent severe without psychotic features (HCC) Discharge Diagnoses: Principal Problem:   Major depressive disorder, recurrent severe without psychotic features (HCC)   Total Time spent with patient: 30 minutes  Musculoskeletal: Strength & Muscle Tone: within normal limits Gait & Station: normal Patient leans: N/A  Psychiatric Specialty Exam  Presentation  General Appearance:  Casual  Eye Contact: Fair  Speech: Pressured  Speech Volume: Increased  Handedness: Right   Mood and Affect  Mood: Euphoric  Duration of Depression Symptoms: No data recorded Affect: Appropriate   Thought Process  Thought Processes: Irrevelant  Descriptions of Associations:Intact  Orientation:Full (Time, Place and Person)  Thought Content:Illogical  History of Schizophrenia/Schizoaffective disorder:No data recorded Duration of Psychotic Symptoms:No data recorded Hallucinations:No data recorded Ideas of Reference:None  Suicidal Thoughts:No data recorded Homicidal Thoughts:No data recorded  Sensorium  Memory: Immediate Fair; Recent Fair; Remote Fair  Judgment: Impaired  Insight: Lacking   Executive Functions  Concentration: Fair  Attention Span: Fair  Recall: [employer] of Knowledge: Fair  Language: Fair   Psychomotor Activity  Psychomotor Activity:No data recorded  Assets  Assets: Communication Skills; Desire for Improvement; Housing; Resilience   Sleep  Sleep:No data recorded  Physical Exam: Physical Exam ROS Blood pressure (!) 153/73, pulse 96, temperature 97.9 F (36.6 C), resp. rate 17, height 5\' 1"  (1.549 m), weight 56 kg, SpO2 100%. Body mass index is 23.34 kg/m.  Mental Status Per Nursing Assessment::   On Admission:  NA  Demographic Factors:  Low socioeconomic status  Loss Factors: Decrease in vocational status  Historical  Factors: Impulsivity  Risk Reduction Factors:   Living with another person, especially a relative, Positive social support, Positive therapeutic relationship, and Positive coping skills or problem solving skills  Continued Clinical Symptoms:  Alcohol/Substance Abuse/Dependencies  Cognitive Features That Contribute To Risk:  None    Suicide Risk:  Minimal: No identifiable suicidal ideation.  Patients presenting with no risk factors but with morbid ruminations; may be classified as minimal risk based on the severity of the depressive symptoms   Follow-up Information     [facility]. Go on 11/12/2023.   Why: Your mental health services begin with [facility] on 11/12/23 according to [name]. They will come to your home as discussed. Contact information: [address], [place], [place] [address]  [phone]                Plan Of Care/Follow-up recommendations:  Activity:  As tolerated  Lenaya Pietsch, MD 11/12/2023, 8:43 AM

## 2023-11-12 NOTE — Group Note (Signed)
 Date:  11/12/2023 Time:  10:57 AM  Group Topic/Focus:  Outdoor, and fresh air therapy    Participation Level:  Active  Participation Quality:  Appropriate  Affect:  Appropriate  Cognitive:  Appropriate  Insight: Appropriate  Engagement in Group:  Engaged  Modes of Intervention:  Activity and Discussion  Additional Comments:  none  Merton Abts 11/12/2023, 10:57 AM

## 2023-11-12 NOTE — Progress Notes (Signed)
 Patient ID: Robin Montgomery, female   DOB: 1961/02/14, 63 y.o.   MRN: [id number]  Patient was discharged from [facility] unit at approx 1245 escorted by staff. Patient denies SI/HI/AVH. Discharge packet to include printed AVS, Suicide Risk Assessment, and Transition Record reviewed with patient. Belongings to include ring, black eyeglasses, and cell phone returned. Suicide safety plan completed with a copy placed in chart.

## 2023-11-12 NOTE — Plan of Care (Signed)

## 2023-11-12 NOTE — Progress Notes (Signed)
 Pt received in dayroom. Alert and oriented. Bright affect. Interacting with peers and staff. Attends group. Po med compliant. C/o foot pain. PRN given for pain/discomfort. Tol well.  At 2230, pt involved in verbal confrontation with peer. Staff able to de-escalate. Pt remain calm and cooperative remainder of the  shift. No further behavior issues noted. Slept well throughout the shift.     11/11/23 2100  Psych Admission Type (Psych Patients Only)  Admission Status Voluntary  Psychosocial Assessment  Patient Complaints None  Eye Contact Fair  Facial Expression Animated  Affect Appropriate to circumstance  Speech Logical/coherent  Interaction Assertive  Motor Activity Fidgety  Appearance/Hygiene In scrubs  Behavior Characteristics Cooperative  Mood Pleasant  Thought Process  Coherency WDL  Content WDL  Delusions None reported or observed  Perception WDL  Hallucination None reported or observed  Judgment Impaired  Confusion None  Danger to Self  Current suicidal ideation? Denies

## 2023-11-14 NOTE — Discharge Summary (Signed)
 Physician Discharge Summary Note  Patient:  Robin Montgomery is an 63 y.o., female MRN:  [id number] DOB:  [birth year] Patient phone:  [phone] (home)  Patient address:   [address] [place] [address],    Date of Admission:  11/07/2023 Date of Discharge: 11/12/23  Reason for Admission:  Robin Montgomery is a 63 y.o. female admitted: Medicallyfor 10/31/2023 11:32 AM for AMS and noted to have low CBG with EMS to 31. She carries the psychiatric diagnoses of MDD, PTSD, AUD, and stimulant use d/o and has a past medical history of  HTN, IDT2DM, HLD. Patient admitted for overdose on insulin  and became hypoglycemic. Patient was seen by psychiatry and admitted patient to inpatient gero psych unit.   Principal Problem: Major depressive disorder, recurrent severe without psychotic features [facility]) Discharge Diagnoses: Principal Problem:   Major depressive disorder, recurrent severe without psychotic features (HCC)   Past Psychiatric History: see h&p  Family Psychiatric  History: see h&p Social History:  Social History   Substance and Sexual Activity  Alcohol Use Yes   Alcohol/week: 0.0 standard drinks of alcohol   Comment: every other week      Social History   Substance and Sexual Activity  Drug Use Yes   Types: Cocaine, Marijuana    Social History   Socioeconomic History   Marital status: Single    Spouse name: Not on file   Number of children: Not on file   Years of education: Not on file   Highest education level: Not on file  Occupational History   Not on file  Tobacco Use   Smoking status: Every Day    Current packs/day: 0.25    Average packs/day: 0.3 packs/day for 51.4 years (12.9 ttl pk-yrs)    Types: Cigarettes    Start date: 67   Smokeless tobacco: Former    Types: Snuff   Tobacco comments:    Patient states she is trying to decrease smoking.  States she smokes 4 - 5 cigarettes daily  Vaping Use   Vaping status: Never Used  Substance and Sexual  Activity   Alcohol use: Yes    Alcohol/week: 0.0 standard drinks of alcohol    Comment: every other week    Drug use: Yes    Types: Cocaine, Marijuana   Sexual activity: Not on file  Other Topics Concern   Not on file  Social History Narrative   Smoker   Cocaine use off and on for past 20 years   Living in motel   Living with 63 yo and 1-something year old child    Social Drivers of Health   Financial Resource Strain: Medium Risk (01/19/2021)   Overall Financial Resource Strain (CARDIA)    Difficulty of Paying Living Expenses: Somewhat hard  Food Insecurity: No Food Insecurity (11/07/2023)   Hunger Vital Sign    Worried About Running Out of Food in the Last Year: Never true    Ran Out of Food in the Last Year: Never true  Transportation Needs: No Transportation Needs (11/07/2023)   PRAPARE - Administrator, Civil Service (Medical): No    Lack of Transportation (Non-Medical): No  Physical Activity: Not on file  Stress: Stress Concern Present (01/19/2021)   [employer] of Occupational Health - Occupational Stress Questionnaire    Feeling of Stress : To some extent  Social Connections: Unknown (10/19/2021)   [facility]   Social Network    Social Network:  Not on file   Past Medical History:  Past Medical History:  Diagnosis Date   Depression    Diabetes mellitus without complication (HCC)    Hypertension    Nicotine  use 11/01/2023   Suicide attempt (HCC) 11/03/2023    Past Surgical History:  Procedure Laterality Date   CHOLECYSTECTOMY     Family History: History reviewed. No pertinent family history.  Hospital Course:  Robin Montgomery is a 63 y.o. female admitted: Medicallyfor 10/31/2023 11:32 AM for AMS and noted to have low CBG with EMS to 31. She carries the psychiatric diagnoses of MDD, PTSD, AUD, and stimulant use d/o and has a past medical history of  HTN, IDT2DM, HLD. Patient admitted for overdose on insulin  and became  hypoglycemic. Patient was seen by psychiatry and admitted patient to inpatient gero psych unit for further stabilization due to her insulin  overdose as a suicide attempt.  Please note the patient was started on Zoloft  50 mg daily and Risperdal  0.5 mg nightly to help gain mood stabilization.  On admission patient informed that she was having her birthday party with her friends and had alcohol and crack cocaine which induced depression and overdose with insulin  as she was under the influence.  As she cleared up from the substances patient is noted to be clear and coherent and is able to verbalize her emotions very clearly.  She consistently denies feeling depressed or anxious and reports that she is high energy , Upbeat person.  She consistently denied SI/HI/plan and denied hallucinations.  Risperdal  dose was increased to 1 mg nightly.  Patient tolerated the increased dose well but displayed some truncal involuntary movements.  Added Cogentin 0.5 twice daily to help with the EPS.  Patient reports feeling lot better and wanted to stay on the current regiment.  Patient participated in the groups and follow the unit rules.  On the day of discharge patient denies SI/HI/plan and remains future oriented to follow-up with outpatient mental health services.  Physical Findings: AIMS:  , ,  ,  ,    CIWA:    COWS:        Psychiatric Specialty Exam:  Presentation  General Appearance:  Appropriate for Environment; Casual  Eye Contact: Fair  Speech: Clear and Coherent  Speech Volume: Normal    Mood and Affect  Mood: Anxious  Affect: Appropriate   Thought Process  Thought Processes: Coherent  Descriptions of Associations:Intact  Orientation:Full (Time, Place and Person)  Thought Content:Logical  Hallucinations:Hallucinations: None  Ideas of Reference:None  Suicidal Thoughts:Suicidal Thoughts: No  Homicidal Thoughts:Homicidal Thoughts: No   Sensorium  Memory: Immediate Fair;  Recent Fair; Remote Fair  Judgment: Fair  Insight: Fair   Art therapist  Concentration: Fair  Attention Span: Fair  Recall: [employer] of Knowledge: Fair  Language: Fair   Psychomotor Activity  Psychomotor Activity: Psychomotor Activity: Normal  Musculoskeletal: Strength & Muscle Tone: within normal limits Gait & Station: normal Assets  Assets: Manufacturing systems engineer; Desire for Improvement   Sleep  Sleep: Sleep: Fair    Physical Exam: Physical Exam ROS Blood pressure (!) 153/73, pulse 96, temperature 97.9 F (36.6 C), resp. rate 17, height 5\' 1"  (1.549 m), weight 56 kg, SpO2 100%. Body mass index is 23.34 kg/m.   Social History   Tobacco Use  Smoking Status Every Day   Current packs/day: 0.25   Average packs/day: 0.3 packs/day for 51.4 years (12.9 ttl pk-yrs)   Types: Cigarettes   Start date: 67  Smokeless Tobacco  Former   Types: Snuff  Tobacco Comments   Patient states she is trying to decrease smoking.  States she smokes 4 - 5 cigarettes daily   Tobacco Cessation:  N/A, patient does not currently use tobacco products   Blood Alcohol level:  Lab Results  Component Value Date   [facility] <15 10/31/2023   ETH <10 11/15/2022    Metabolic Disorder Labs:  Lab Results  Component Value Date   HGBA1C 9.5 (H) 11/02/2023   MPG 225.95 11/02/2023   MPG 214.47 11/16/2022   No results found for: "PROLACTIN" Lab Results  Component Value Date   CHOL 231 (H) 11/02/2023   TRIG 118 11/02/2023   HDL 66 11/02/2023   CHOLHDL 3.5 11/02/2023   VLDL 24 11/02/2023   LDLCALC 141 (H) 11/02/2023   LDLCALC 102 (H) 02/28/2022    See Psychiatric Specialty Exam and Suicide Risk Assessment completed by Attending Physician prior to discharge.  Discharge destination:  Home  Is patient on multiple antipsychotic therapies at discharge:  No   Has Patient had three or more failed trials of antipsychotic monotherapy by history:  No  Recommended Plan for  Multiple Antipsychotic Therapies: NA   Allergies as of 11/12/2023       Reactions   Insulins    Suicide attempt on insulin  11/01/2023   Aspirin Nausea And Vomiting        Medication List     STOP taking these medications    multivitamin with minerals Tabs tablet       TAKE these medications      Indication  albuterol  108 (90 Base) MCG/ACT inhaler Commonly known as: Ventolin  HFA Inhale 1-2 puffs into the lungs every 6 (six) hours as needed for wheezing or shortness of breath.    benztropine 0.5 MG tablet Commonly known as: COGENTIN Take 1 tablet (0.5 mg total) by mouth 2 (two) times daily.    folic acid  1 MG tablet Commonly known as: FOLVITE  Take 1 tablet (1 mg total) by mouth daily.    glimepiride  1 MG tablet Commonly known as: AMARYL  Take 1 tablet (1 mg total) by mouth 2 (two) times daily with a meal.    insulin  aspart 100 UNIT/ML injection Commonly known as: novoLOG  Inject 0-9 Units into the skin 3 (three) times daily with meals.    nicotine  21 mg/24hr patch Commonly known as: NICODERM CQ  - dosed in mg/24 hours Place 1 patch (21 mg total) onto the skin daily.    risperiDONE  1 MG tablet Commonly known as: RISPERDAL  Take 1 tablet (1 mg total) by mouth at bedtime. What changed:  medication strength how much to take    sertraline  50 MG tablet Commonly known as: ZOLOFT  Take 1 tablet (50 mg total) by mouth daily.    thiamine  100 MG tablet Commonly known as: Vitamin B-1 Take 1 tablet (100 mg total) by mouth daily.         Follow-up Information     [facility]. Go on 11/12/2023.   Why: Your mental health services begin with [facility] on 11/12/23 according to [name]. They will come to your home as discussed. Contact information: [address] [place], [place] [address]  [phone]                Follow-up recommendations:  Activity:  As tolerated    Signed: Tarin Navarez, MD 11/14/2023, 11:27 PM

## 2024-07-12 ENCOUNTER — Inpatient Hospital Stay (HOSPITAL_COMMUNITY): Admission: EM | Admit: 2024-07-12 | Payer: MEDICAID | Source: Home / Self Care | Admitting: Internal Medicine

## 2024-07-12 DIAGNOSIS — T383X2A Poisoning by insulin and oral hypoglycemic [antidiabetic] drugs, intentional self-harm, initial encounter: Secondary | ICD-10-CM

## 2024-07-12 DIAGNOSIS — E876 Hypokalemia: Secondary | ICD-10-CM

## 2024-07-12 DIAGNOSIS — T1491XA Suicide attempt, initial encounter: Principal | ICD-10-CM

## 2024-07-12 DIAGNOSIS — E162 Hypoglycemia, unspecified: Secondary | ICD-10-CM | POA: Diagnosis present

## 2024-07-12 LAB — CBC WITH DIFFERENTIAL/PLATELET
Abs Immature Granulocytes: 0.02 10*3/uL (ref 0.00–0.07)
Basophils Absolute: 0 10*3/uL (ref 0.0–0.1)
Basophils Relative: 0 %
Eosinophils Absolute: 0.1 10*3/uL (ref 0.0–0.5)
Eosinophils Relative: 1 %
HCT: 43 % (ref 36.0–46.0)
Hemoglobin: 13.9 g/dL (ref 12.0–15.0)
Immature Granulocytes: 0 %
Lymphocytes Relative: 44 %
Lymphs Abs: 3.3 10*3/uL (ref 0.7–4.0)
MCH: 31.7 pg (ref 26.0–34.0)
MCHC: 32.3 g/dL (ref 30.0–36.0)
MCV: 97.9 fL (ref 80.0–100.0)
Monocytes Absolute: 0.2 10*3/uL (ref 0.1–1.0)
Monocytes Relative: 3 %
Neutro Abs: 3.9 10*3/uL (ref 1.7–7.7)
Neutrophils Relative %: 52 %
Platelets: 462 10*3/uL — ABNORMAL HIGH (ref 150–400)
RBC: 4.39 MIL/uL (ref 3.87–5.11)
RDW: 12.8 % (ref 11.5–15.5)
WBC: 7.5 10*3/uL (ref 4.0–10.5)
nRBC: 0 % (ref 0.0–0.2)

## 2024-07-12 LAB — MAGNESIUM: Magnesium: 1.8 mg/dL (ref 1.7–2.4)

## 2024-07-12 LAB — COMPREHENSIVE METABOLIC PANEL WITH GFR
ALT: 17 U/L (ref 0–44)
AST: 18 U/L (ref 15–41)
Albumin: 4.8 g/dL (ref 3.5–5.0)
Alkaline Phosphatase: 115 U/L (ref 38–126)
Anion gap: 14 (ref 5–15)
BUN: 7 mg/dL — ABNORMAL LOW (ref 8–23)
CO2: 26 mmol/L (ref 22–32)
Calcium: 10.2 mg/dL (ref 8.9–10.3)
Chloride: 106 mmol/L (ref 98–111)
Creatinine, Ser: 0.51 mg/dL (ref 0.44–1.00)
GFR, Estimated: >60 mL/min
Glucose, Bld: 24 mg/dL — CL (ref 70–99)
Potassium: 2.6 mmol/L — CL (ref 3.5–5.1)
Sodium: 146 mmol/L — ABNORMAL HIGH (ref 135–145)
Total Bilirubin: <0.2 mg/dL (ref 0.0–1.2)
Total Protein: 8.5 g/dL — ABNORMAL HIGH (ref 6.5–8.1)

## 2024-07-12 LAB — URINE DRUG SCREEN
Amphetamines: NEGATIVE
Barbiturates: NEGATIVE
Benzodiazepines: NEGATIVE
Cocaine: POSITIVE — AB
Fentanyl: NEGATIVE
Methadone Scn, Ur: NEGATIVE
Opiates: NEGATIVE
Tetrahydrocannabinol: POSITIVE — AB

## 2024-07-12 LAB — CBG MONITORING, ED
Glucose-Capillary: 24 mg/dL — CL (ref 70–99)
Glucose-Capillary: 56 mg/dL — ABNORMAL LOW (ref 70–99)
Glucose-Capillary: 45 mg/dL — ABNORMAL LOW (ref 70–99)
Glucose-Capillary: 141 mg/dL — ABNORMAL HIGH (ref 70–99)
Glucose-Capillary: 76 mg/dL (ref 70–99)
Glucose-Capillary: 57 mg/dL — ABNORMAL LOW (ref 70–99)
Glucose-Capillary: 65 mg/dL — ABNORMAL LOW (ref 70–99)

## 2024-07-12 LAB — ETHANOL: Alcohol, Ethyl (B): 174 mg/dL — ABNORMAL HIGH

## 2024-07-12 LAB — ACETAMINOPHEN LEVEL: Acetaminophen (Tylenol), Serum: <10 ug/mL — ABNORMAL LOW (ref 10–30)

## 2024-07-12 LAB — SALICYLATE LEVEL: Salicylate Lvl: <7 mg/dL — ABNORMAL LOW (ref 7.0–30.0)

## 2024-07-12 MED ORDER — POTASSIUM CHLORIDE 10 MEQ/100ML IV SOLN
10.0000 meq | INTRAVENOUS | Status: AC
  Administered 2024-07-12 (×2): 10 meq via INTRAVENOUS
  Filled 2024-07-12: qty 100

## 2024-07-12 MED ORDER — POTASSIUM CHLORIDE CRYS ER 20 MEQ PO TBCR
40.0000 meq | EXTENDED_RELEASE_TABLET | ORAL | Status: AC
  Administered 2024-07-12: 40 meq via ORAL
  Filled 2024-07-12: qty 2

## 2024-07-12 MED ORDER — SODIUM CHLORIDE 0.9% FLUSH
3.0000 mL | Freq: Two times a day (BID) | INTRAVENOUS | Status: AC
  Administered 2024-07-12: 3 mL via INTRAVENOUS

## 2024-07-12 MED ORDER — DEXTROSE 50 % IV SOLN
INTRAVENOUS | Status: AC
  Filled 2024-07-12: qty 50

## 2024-07-12 MED ORDER — MORPHINE SULFATE (PF) 2 MG/ML IV SOLN
2.0000 mg | INTRAVENOUS | Status: AC | PRN
  Administered 2024-07-12: 2 mg via INTRAVENOUS
  Filled 2024-07-12: qty 1

## 2024-07-12 MED ORDER — SODIUM CHLORIDE 0.9 % IV SOLN: 250.0000 mL | INTRAVENOUS | Status: AC | PRN

## 2024-07-12 MED ORDER — POTASSIUM CHLORIDE 10 MEQ/100ML IV SOLN
10.0000 meq | Freq: Once | INTRAVENOUS | Status: AC
  Administered 2024-07-12: 10 meq via INTRAVENOUS
  Filled 2024-07-12: qty 100

## 2024-07-12 MED ORDER — DEXTROSE 50 % IV SOLN
25.0000 g | Freq: Once | INTRAVENOUS | Status: AC
  Administered 2024-07-12: 25 g via INTRAVENOUS
  Filled 2024-07-12: qty 50

## 2024-07-12 MED ORDER — SODIUM CHLORIDE 0.9% FLUSH: 3.0000 mL | INTRAVENOUS | Status: AC | PRN

## 2024-07-12 MED ORDER — DEXTROSE 50 % IV SOLN
1.0000 | Freq: Once | INTRAVENOUS | Status: AC
  Administered 2024-07-12: 50 mL via INTRAVENOUS
  Filled 2024-07-12: qty 50

## 2024-07-12 MED ORDER — POTASSIUM CHLORIDE CRYS ER 20 MEQ PO TBCR
40.0000 meq | EXTENDED_RELEASE_TABLET | Freq: Once | ORAL | Status: AC
  Administered 2024-07-12: 40 meq via ORAL
  Filled 2024-07-12: qty 2

## 2024-07-12 MED ORDER — DEXTROSE 10 % IV SOLN
100.0000 mL | INTRAVENOUS | Status: AC
  Administered 2024-07-12: 100 mL via INTRAVENOUS

## 2024-07-12 NOTE — ED Notes (Signed)
 PT alert at this time after amp of D50, pt provided with turkey sandwich, 4oz apple juice, 4oz orange juice, and graham crackers.

## 2024-07-12 NOTE — ED Notes (Signed)
 Poison consulted,  Recommends 4 hr acetaminophen  check 6HR post dextrose  drip monitoring Potassium replacement

## 2024-07-12 NOTE — ED Notes (Signed)
 Pt ambulatory to bathroom with assistance, urine sample provided

## 2024-07-12 NOTE — ED Provider Notes (Signed)
 " [place] EMERGENCY DEPARTMENT AT [facility] Provider Note   CSN: [id number] Arrival date & time: 07/12/24  [id number]     Patient presents with: Hypoglycemia   Robin Montgomery is a 64 y.o. female.   Patient is a 64 year old female with a past medical history of diabetes, hypertension, substance use, depression presenting to the emergency department after an intentional insulin  overdose.  EMS reports that approximately 30 minutes prior to arrival the patient took an unknown amount of insulin  but was remaining in her insulin  pen.  She states that she called her counselor who called 911.  They reported that her blood sugar was 200 on their arrival.  Patient does report that this was intentional and that she wanted to kill herself.  She states that she also drink alcohol today and used marijuana and cocaine which she states that she uses daily.  She denies taking any other medications.  The history is provided by the patient and the EMS personnel.  Hypoglycemia      Prior to Admission medications  Medication Sig Start Date End Date Taking? Authorizing Provider  albuterol  (VENTOLIN  HFA) 108 (90 Base) MCG/ACT inhaler Inhale 1-2 puffs into the lungs every 6 (six) hours as needed for wheezing or shortness of breath. 11/19/22   Drusilla Sabas RAMAN, MD  benztropine  (COGENTIN ) 0.5 MG tablet Take 1 tablet (0.5 mg total) by mouth 2 (two) times daily. 11/12/23   Donnelly Mellow, MD  folic acid  (FOLVITE ) 1 MG tablet Take 1 tablet (1 mg total) by mouth daily. 11/12/23   Jadapalle, Sree, MD  glimepiride  (AMARYL ) 1 MG tablet Take 1 tablet (1 mg total) by mouth 2 (two) times daily with a meal. 11/03/23   Elicia Hamlet, MD  insulin  aspart (NOVOLOG ) 100 UNIT/ML injection Inject 0-9 Units into the skin 3 (three) times daily with meals. 11/12/23   Jadapalle, Sree, MD  nicotine  (NICODERM CQ  - DOSED IN MG/24 HOURS) 21 mg/24hr patch Place 1 patch (21 mg total) onto the skin daily. 11/12/23   Jadapalle, Sree, MD   risperiDONE  (RISPERDAL ) 1 MG tablet Take 1 tablet (1 mg total) by mouth at bedtime. 11/12/23   Jadapalle, Sree, MD  sertraline  (ZOLOFT ) 50 MG tablet Take 1 tablet (50 mg total) by mouth daily. 11/04/23   Elicia Hamlet, MD  thiamine  (VITAMIN B-1) 100 MG tablet Take 1 tablet (100 mg total) by mouth daily. 11/08/23   Janna Ferrier, DO    Allergies: Insulins and Aspirin    Review of Systems  Updated Vital Signs BP 111/60   Pulse (!) 109   Resp (!) 26   SpO2 97%   Physical Exam Vitals and nursing note reviewed.  Constitutional:      General: She is not in acute distress.    Appearance: Normal appearance.     Comments: Intoxicated appearing  HENT:     Head: Normocephalic and atraumatic.     Nose: Nose normal.     Mouth/Throat:     Mouth: Mucous membranes are moist.     Pharynx: Oropharynx is clear.  Eyes:     Extraocular Movements: Extraocular movements intact.     Conjunctiva/sclera: Conjunctivae normal.     Comments: Pupils appropriate size for room, approximately 3 mm and equal bilaterally  Cardiovascular:     Rate and Rhythm: Normal rate and regular rhythm.     Heart sounds: Normal heart sounds.  Pulmonary:     Effort: Pulmonary effort is normal.     Breath sounds:  Normal breath sounds.  Abdominal:     General: Abdomen is flat.     Palpations: Abdomen is soft.     Tenderness: There is no abdominal tenderness.  Musculoskeletal:        General: Normal range of motion.     Cervical back: Normal range of motion and neck supple.  Skin:    General: Skin is warm and dry.  Neurological:     General: No focal deficit present.     Mental Status: She is alert and oriented to person, place, and time.  Psychiatric:        Mood and Affect: Mood normal.        Behavior: Behavior normal.     (all labs ordered are listed, but only abnormal results are displayed) Labs Reviewed  COMPREHENSIVE METABOLIC PANEL WITH GFR - Abnormal; Notable for the following components:      Result  Value   Sodium 146 (*)    Potassium 2.6 (*)    Glucose, Bld 24 (*)    BUN 7 (*)    Total Protein 8.5 (*)    All other components within normal limits  SALICYLATE LEVEL - Abnormal; Notable for the following components:   Salicylate Lvl <7.0 (*)    All other components within normal limits  ACETAMINOPHEN  LEVEL - Abnormal; Notable for the following components:   Acetaminophen  (Tylenol ), Serum <10 (*)    All other components within normal limits  ETHANOL - Abnormal; Notable for the following components:   Alcohol, Ethyl (B) 174 (*)    All other components within normal limits  URINE DRUG SCREEN - Abnormal; Notable for the following components:   Cocaine POSITIVE (*)    Tetrahydrocannabinol POSITIVE (*)    All other components within normal limits  CBC WITH DIFFERENTIAL/PLATELET - Abnormal; Notable for the following components:   Platelets 462 (*)    All other components within normal limits  CBG MONITORING, ED - Abnormal; Notable for the following components:   Glucose-Capillary 24 (*)    All other components within normal limits  CBG MONITORING, ED - Abnormal; Notable for the following components:   Glucose-Capillary 56 (*)    All other components within normal limits  MAGNESIUM     EKG: None  Radiology: No results found.   .Critical Care  Performed by: Kingsley, Garrin Kirwan K, DO Authorized by: Ellouise Richerd POUR, DO   Critical care provider statement:    Critical care time (minutes):  30   Critical care was necessary to treat or prevent imminent or life-threatening deterioration of the following conditions:  Toxidrome   Critical care was time spent personally by me on the following activities:  Development of treatment plan with patient or surrogate, discussions with consultants, evaluation of patient's response to treatment, examination of patient, ordering and review of laboratory studies, ordering and review of radiographic studies, ordering and performing treatments and  interventions, pulse oximetry, re-evaluation of patient's condition and review of old charts    Medications Ordered in the ED  sodium chloride  flush (NS) 0.9 % injection 3 mL (has no administration in time range)  sodium chloride  flush (NS) 0.9 % injection 3 mL (has no administration in time range)  0.9 %  sodium chloride  infusion (has no administration in time range)  potassium chloride  SA (KLOR-CON  M) CR tablet 40 mEq (has no administration in time range)  potassium chloride  10 mEq in 100 mL IVPB (has no administration in time range)  dextrose  10 % infusion (has no  administration in time range)  dextrose  50 % solution (  Given 07/12/24 1904)    Clinical Course as of 07/12/24 2017  Fri Jul 12, 2024  2012 Glucose 56 after eating, will start on dextrose  drip. Poison control recommends 4 hr Tylenol  level and glucose checks for 6 hours post-D50. With needing to start on dextrose  drip will be admitted for medical clearance prior to psych eval.  [VK]    Clinical Course User Index [VK] Ellouise Richerd POUR, DO                                 Medical Decision Making This patient presents to the ED with chief complaint(s) of intentional overdose with pertinent past medical history of diabetes, hypertension, substance use, depression which further complicates the presenting complaint. The complaint involves an extensive differential diagnosis and also carries with it a high risk of complications and morbidity.    The differential diagnosis includes hypoglycemia/insulin  overdose, other medication overdose, intoxication, withdrawal, suicidal ideation  Additional history obtained: Additional history obtained from EMS  Records reviewed previous admission documents  ED Course and Reassessment: On patient's arrival she was mildly tachycardic and otherwise hemodynamically stable and in no acute distress.  Patient does not want to stay in the emergency department and will have IVC filed for safety.   Poison control will be called.  She will need every hour glucose checks unless otherwise requested by poison control and additional 2 toxic workup.  Shortly after the patient's arrival she became increasingly somnolent and glucose was 24.  She was given an amp of D50 and immediately returned back to her normal mental baseline.  She was given juice and food to eat and will continue to closely monitor blood sugars.  Independent labs interpretation:  The following labs were independently interpreted: hypoglycemia, hypokalemia  Independent visualization of imaging: - N/A  Consultation: - Consulted or discussed management/test interpretation w/ external professional: hospitalist  Consideration for admission or further workup: patient requires admission for medical clearance prior to psychiatry eval Social Determinants of health: substance use disorder     Amount and/or Complexity of Data Reviewed Labs: ordered.  Risk Prescription drug management. Decision regarding hospitalization.       Final diagnoses:  Suicide attempt [facility])  Insulin  overdose, intentional self-harm, initial encounter [facility])  Hypokalemia    ED Discharge Orders     None          Ellouise Richerd POUR, DO 07/12/24 2017  "

## 2024-07-12 NOTE — ED Triage Notes (Signed)
 Pt BIB EMS from home, pt taken approx. 180 units of lispro insulin . And drank an unknown amount of etoh. Pt is alert at this time, NAD, pt able to answer questions but is very loud and boisterous.

## 2024-07-12 NOTE — ED Notes (Signed)
 Pt has 2 belonging bags in cabinet behind 16-22

## 2024-07-12 NOTE — ED Notes (Signed)
 Pt becoming increasingly lethargic and unresponsive. Charge nurse and MD aware. Dextrose  50% solution administered.

## 2024-07-13 LAB — CBG MONITORING, ED: Glucose-Capillary: 157 mg/dL — ABNORMAL HIGH (ref 70–99)
